# Patient Record
Sex: Male | Born: 1980
Health system: Southern US, Community
[De-identification: ages and names within clinical notes are randomized; demographics above are authoritative.]

## PROBLEM LIST (undated history)

## (undated) DIAGNOSIS — K7030 Alcoholic cirrhosis of liver without ascites: Secondary | ICD-10-CM

## (undated) DIAGNOSIS — I10 Essential (primary) hypertension: Secondary | ICD-10-CM

## (undated) DIAGNOSIS — S82899A Other fracture of unspecified lower leg, initial encounter for closed fracture: Secondary | ICD-10-CM

## (undated) DIAGNOSIS — D649 Anemia, unspecified: Secondary | ICD-10-CM

## (undated) DIAGNOSIS — R351 Nocturia: Secondary | ICD-10-CM

## (undated) DIAGNOSIS — K7011 Alcoholic hepatitis with ascites: Secondary | ICD-10-CM

## (undated) DIAGNOSIS — D696 Thrombocytopenia, unspecified: Secondary | ICD-10-CM

## (undated) DIAGNOSIS — R188 Other ascites: Secondary | ICD-10-CM

## (undated) DIAGNOSIS — K219 Gastro-esophageal reflux disease without esophagitis: Secondary | ICD-10-CM

## (undated) HISTORY — PX: FOOT SURGERY: SHX648

## (undated) HISTORY — PX: ORTHOPEDIC SURGERY: SHX850

## (undated) HISTORY — DX: Alcoholic cirrhosis of liver without ascites: K70.30

---

## 2007-04-03 ENCOUNTER — Emergency Department (HOSPITAL_COMMUNITY): Admission: EM | Admit: 2007-04-03 | Discharge: 2007-04-03 | Payer: Self-pay | Admitting: Family Medicine

## 2008-12-01 ENCOUNTER — Emergency Department (HOSPITAL_COMMUNITY): Admission: EM | Admit: 2008-12-01 | Discharge: 2008-12-01 | Payer: Self-pay | Admitting: Emergency Medicine

## 2010-08-05 LAB — POCT I-STAT, CHEM 8
BUN: 9 mg/dL (ref 6–23)
Chloride: 104 mEq/L (ref 96–112)
Creatinine, Ser: 0.8 mg/dL (ref 0.4–1.5)
Glucose, Bld: 94 mg/dL (ref 70–99)
Potassium: 4.4 mEq/L (ref 3.5–5.1)
Sodium: 136 mEq/L (ref 135–145)
TCO2: 25 mmol/L (ref 0–100)

## 2010-08-05 LAB — COMPREHENSIVE METABOLIC PANEL
ALT: 67 U/L — ABNORMAL HIGH (ref 0–53)
AST: 92 U/L — ABNORMAL HIGH (ref 0–37)
Albumin: 4.1 g/dL (ref 3.5–5.2)
Alkaline Phosphatase: 87 U/L (ref 39–117)
GFR calc Af Amer: >60 mL/min (ref >60)
Glucose, Bld: 94 mg/dL (ref 70–99)
Total Protein: 8.1 g/dL (ref 6.0–8.3)

## 2012-10-30 ENCOUNTER — Encounter (HOSPITAL_COMMUNITY): Payer: Self-pay | Admitting: Emergency Medicine

## 2012-10-30 ENCOUNTER — Emergency Department (INDEPENDENT_AMBULATORY_CARE_PROVIDER_SITE_OTHER)
Admission: EM | Admit: 2012-10-30 | Discharge: 2012-10-30 | Disposition: A | Discharge status: Home / Self Care | Payer: Self-pay | Source: Home / Self Care | Attending: Emergency Medicine | Admitting: Emergency Medicine

## 2012-10-30 DIAGNOSIS — Z4802 Encounter for removal of sutures: Secondary | ICD-10-CM

## 2012-10-30 DIAGNOSIS — S81802D Unspecified open wound, left lower leg, subsequent encounter: Secondary | ICD-10-CM

## 2012-10-30 NOTE — ED Provider Notes (Signed)
Medical screening examination/treatment/procedure(s) were performed by non-physician practitioner and as supervising physician I was immediately available for consultation/collaboration.  Leslee Home, M.D.  Reuben Likes, MD 10/30/12 [phone]
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
## 2015-07-04 ENCOUNTER — Encounter (HOSPITAL_COMMUNITY): Payer: Self-pay | Admitting: *Deleted

## 2015-07-04 ENCOUNTER — Emergency Department (HOSPITAL_COMMUNITY)
Admission: EM | Admit: 2015-07-04 | Discharge: 2015-07-04 | Disposition: A | Discharge status: Home / Self Care | Payer: Self-pay | Attending: Emergency Medicine | Admitting: Emergency Medicine

## 2015-07-04 DIAGNOSIS — I10 Essential (primary) hypertension: Secondary | ICD-10-CM | POA: Insufficient documentation

## 2015-07-04 DIAGNOSIS — M25562 Pain in left knee: Secondary | ICD-10-CM | POA: Insufficient documentation

## 2015-07-04 DIAGNOSIS — Z87828 Personal history of other (healed) physical injury and trauma: Secondary | ICD-10-CM | POA: Insufficient documentation

## 2015-07-04 HISTORY — DX: Essential (primary) hypertension: I10
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
## 2015-07-04 NOTE — ED Notes (Signed)
Declined W/C at D/C and was escorted to lobby by RN. 

## 2015-07-04 NOTE — ED Notes (Signed)
Pt reports pain to Lt knee worse when walking down stairs.

## 2017-06-27 ENCOUNTER — Emergency Department (HOSPITAL_COMMUNITY): Payer: Self-pay

## 2017-06-27 ENCOUNTER — Emergency Department (HOSPITAL_COMMUNITY)
Admission: EM | Admit: 2017-06-27 | Discharge: 2017-06-27 | Disposition: A | Discharge status: Home / Self Care | Payer: Self-pay | Attending: Emergency Medicine | Admitting: Emergency Medicine

## 2017-06-27 ENCOUNTER — Other Ambulatory Visit: Payer: Self-pay

## 2017-06-27 ENCOUNTER — Encounter (HOSPITAL_COMMUNITY): Payer: Self-pay

## 2017-06-27 DIAGNOSIS — K7031 Alcoholic cirrhosis of liver with ascites: Secondary | ICD-10-CM | POA: Insufficient documentation

## 2017-06-27 DIAGNOSIS — I10 Essential (primary) hypertension: Secondary | ICD-10-CM | POA: Insufficient documentation

## 2017-06-27 LAB — CBC
HCT: 36.5 % — ABNORMAL LOW (ref 39.0–52.0)
Hemoglobin: 12.9 g/dL — ABNORMAL LOW (ref 13.0–17.0)
MCH: 35.8 pg — ABNORMAL HIGH (ref 26.0–34.0)
MCHC: 35.3 g/dL (ref 30.0–36.0)
MCV: 101.4 fL — ABNORMAL HIGH (ref 78.0–100.0)
PLATELETS: 65 10*3/uL — AB (ref 150–400)
RBC: 3.6 MIL/uL — ABNORMAL LOW (ref 4.22–5.81)
RDW: 15.5 % (ref 11.5–15.5)
WBC: 11.1 10*3/uL — AB (ref 4.0–10.5)

## 2017-06-27 LAB — COMPREHENSIVE METABOLIC PANEL
ALK PHOS: 182 U/L — AB (ref 38–126)
ALT: 54 U/L (ref 17–63)
AST: 115 U/L — ABNORMAL HIGH (ref 15–41)
Albumin: 2.3 g/dL — ABNORMAL LOW (ref 3.5–5.0)
Anion gap: 9 (ref 5–15)
BUN: <5 mg/dL — ABNORMAL LOW (ref 6–20)
CALCIUM: 8 mg/dL — AB (ref 8.9–10.3)
CHLORIDE: 107 mmol/L (ref 101–111)
CO2: 19 mmol/L — ABNORMAL LOW (ref 22–32)
CREATININE: 0.42 mg/dL — AB (ref 0.61–1.24)
Glucose, Bld: 99 mg/dL (ref 65–99)
Potassium: 3.6 mmol/L (ref 3.5–5.1)
Sodium: 135 mmol/L (ref 135–145)
Total Bilirubin: 2.7 mg/dL — ABNORMAL HIGH (ref 0.3–1.2)
Total Protein: 6.7 g/dL (ref 6.5–8.1)

## 2017-06-27 LAB — PROTIME-INR
INR: 1.62
PROTHROMBIN TIME: 19.1 s — AB (ref 11.4–15.2)

## 2017-06-27 NOTE — ED Triage Notes (Signed)
Using spanish interpreter [phone], pt states he has had swelling all over his body X10 days. Pt has rx for lactulose at home and he states he has liver problems due to heavy ETOH use in the past. Last drink was Monday. Pt does appear to be swollen in his legs and abdomen.

## 2017-06-27 NOTE — ED Notes (Signed)
Patient still off the floor.

## 2017-06-27 NOTE — Discharge Instructions (Signed)
Please follow with your primary care doctor in the next 2 days for a check-up. They must obtain records for further management.  ° °Do not hesitate to return to the Emergency Department for any new, worsening or concerning symptoms.  ° °

## 2017-06-27 NOTE — Discharge Planning (Signed)
EDCM consulted to assist pt with establishing PCP.  [facility], who requests pt go to office to complete new pt packet before setting appointment date/time.

## 2017-06-27 NOTE — ED Notes (Signed)
Patient transported to Ultrasound 

## 2017-06-27 NOTE — ED Notes (Addendum)
D/c reviewed with patient-no further questions No e-signature available
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
## 2017-06-27 NOTE — ED Notes (Signed)
PATIENT BACK TO ROOM

## 2017-06-29 ENCOUNTER — Ambulatory Visit: Payer: Self-pay | Admitting: Physician Assistant

## 2017-06-29 ENCOUNTER — Encounter: Payer: Self-pay | Admitting: Physician Assistant

## 2017-06-29 DIAGNOSIS — K7031 Alcoholic cirrhosis of liver with ascites: Secondary | ICD-10-CM

## 2017-06-29 DIAGNOSIS — R601 Generalized edema: Secondary | ICD-10-CM

## 2017-06-29 DIAGNOSIS — F17200 Nicotine dependence, unspecified, uncomplicated: Secondary | ICD-10-CM

## 2017-06-29 DIAGNOSIS — Z6841 Body Mass Index (BMI) 40.0 and over, adult: Secondary | ICD-10-CM | POA: Insufficient documentation

## 2017-06-29 HISTORY — DX: Alcoholic cirrhosis of liver with ascites: K70.31

## 2017-06-29 MED ORDER — FUROSEMIDE 20 MG PO TABS
10.0000 mg | ORAL_TABLET | Freq: Every day | ORAL | 3 refills | Status: DC
Start: 2017-06-29 — End: 2018-03-07

## 2017-06-29 MED ORDER — SPIRONOLACTONE 25 MG PO TABS: 25.0000 mg | ORAL_TABLET | Freq: Every day | ORAL | 3 refills | Status: DC

## 2017-06-29 NOTE — Assessment & Plan Note (Signed)
Reviewed US and lab results from ED with patient. Provided information on this condition. Referral to GI. STOP potassium. Start spironolactone and furosemide, very low doses due to normal BP today. Re-evaluate in 1 week.

## 2017-06-29 NOTE — Patient Instructions (Addendum)
1. STOP the potassium. 2. START the new medications. 3. Stop smoking. 4. Do not consume alcohol.    IF you received an x-ray today, you will receive an invoice from [facility]. Please contact [facility] at [phone] with questions or concerns regarding your invoice.   IF you received labwork today, you will receive an invoice from [facility]. Please contact [facility] at [phone] with questions or concerns regarding your invoice.   Our billing staff will not be able to assist you with questions regarding bills from these companies.  You will be contacted with the lab results as soon as they are available. The fastest way to get your results is to activate your My Chart account. Instructions are located on the last page of this paperwork. If you have not heard from us regarding the results in 2 weeks, please contact this office.     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 La causa de esta enfermedad es el consumo excesivo de alcohol durante muchos aos. CUIDADOS EN EL HOGAR  No beba alcohol.  Tome los medicamentos solamente como se lo haya indicado el mdico.  Tome las vitaminas solamente como se lo haya indicado el mdico.  Siga las indicaciones que el mdico le dio respecto de la dieta. Puede ser necesario que: ? Consuma alimentos que contengan tiamina, entre ellos, cereales [place]eller[place] Terraceintegrales, cerdo y verduras crudas. ? Consuma alimentos con cido flico, entre ellos, verduras, frutas, carnes, frijoles, frutos secos y productos lcteos. ? Consuma alimentos con alto contenido de carbohidratos, entre ellos, yogur, frijoles, patatas y Surveyor, mineralsarroz. SOLICITE AYUDA SI:  Tiene fiebre.  Comienza a sentir falta de aire.  Tiene dificultad para respirar.  Observa sangre de color rojo brillante en las heces (materia fecal).  Las heces parecen  alquitranadas.  Vomita sangre.  La piel se le torna de un color ms amarillento, plida u oscura.  Tiene dolores de Turkmenistancabeza.  Tiene dificultad para pensar.  Tiene problemas de equilibrio o para caminar. Esta informacin no tiene Theme park managercomo fin reemplazar el consejo del mdico. Asegrese de hacerle al mdico cualquier pregunta que tenga. Document Released: 05/19/2010 Document Revised: 08/31/2014 Document Reviewed: 03/18/2014 Elsevier Interactive Patient Education  [employer]2018 [employer]
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
## 2017-06-29 NOTE — Progress Notes (Signed)
Patient ID: Jason [name], male     DOB: 12/05/1980, 37 y.o.    MRN: [id number]  PCP: Patient, No Pcp Per  Chief Complaint  Patient presents with  . Edema    for 12 days    Subjective:   This patient is new to me and presents for evaluation of swelling x 12 days. [employer] translation service utilized Jason [name], then [place])  The patient initially presented reporting swelling for 12 days, progressively worsening, starting after he was given "a bunch of vitamins" while in [employer] custody beginning 3 weeks ago. He reported that he did not know what they were or why he was given them.  Once his chart was reviewed, he clarified that he stopped drinking 3-4 weeks ago after long-term heavy drinking, and that he was seen in the emergency department for this same swelling several days ago.   Apparently, during his detainment, he was diagnosed with liver failure and started on: lactulose, thiamine, potassium and multivitamin.   Evaluation in the emergency department included: CMET, CBC PT/INR and US abdomen. Albumin 2.3, AST 115, ALT 54, Alk Phos 182, total bili 2.7 PT 19.1, INR 1.62 Hgb 12.9, HCT 36.5, MCV 101.4, platelets 65 Liver noted to be nodular, indicative of cirrhosis and moderate ascites.  As he was hemodynamically stable, he was released to follow-up as outpatient. He was encouraged to complete paperwork at the [name].  He denies chest pain, SOB, orthopnea, dizziness, urinary urgency, frequency, burning, diarrhea. No visual changes. No fever, chills.  Swelling has worsened a little bit. He is most concerned about swelling in the scrotum.  Swelling in the scrotum was some better when he first got up this morning, but has worsened a little over the course of the day so far..  Review of Systems  Constitutional: Negative.   HENT: Negative.   Eyes: Negative.   Respiratory: Negative.   Cardiovascular: Positive for leg swelling (extends up  the legs into the abdomen and involves the scrotum). Negative for chest pain and palpitations.  Gastrointestinal: Positive for abdominal distention. Negative for abdominal pain, anal bleeding, blood in stool, constipation, diarrhea, nausea, rectal pain and vomiting.  Endocrine: Negative.   Genitourinary: Positive for scrotal swelling. Negative for decreased urine volume, difficulty urinating, discharge, dysuria, enuresis, flank pain, frequency, genital sores, hematuria, penile pain, penile swelling, testicular pain and urgency.  Musculoskeletal: Negative.   Skin: Negative.   Allergic/Immunologic: Negative.   Neurological: Negative.   Hematological: Negative.   Psychiatric/Behavioral: Negative.      Prior to Admission medications   Medication Sig Start Date End Date Taking? Authorizing Provider  lactulose (CHRONULAC) 10 GM/15ML solution Take by mouth 2 (two) times daily.   Yes [provider]  Multiple Vitamins-Minerals (THEREMS-M) TABS Take by mouth.   Yes [provider]  potassium chloride (MICRO-K) 10 MEQ CR capsule Take 10 mEq by mouth 2 (two) times daily.   Yes [provider]  thiamine (VITAMIN B-1) 50 MG tablet Take 50 mg by mouth daily.   Yes [provider]  OVER THE COUNTER MEDICATION morphine    [provider]     No Known Allergies   Patient Active Problem List   Diagnosis Date Noted  . Edema 06/29/2017     Family History  Problem Relation Age of Onset  . Healthy Mother      Social History   Socioeconomic History  . Marital status: Married    Spouse name: Not on file  .  Number of children: Not on file  . Years of education: Not on file  . Highest education level: Not on file  Social Needs  . Financial resource strain: Not on file  . Food insecurity - worry: Not on file  . Food insecurity - inability: Not on file  . Transportation needs - medical: Not on file  . Transportation needs - non-medical: Not on file    Occupational History  . Occupation: Curator Engineer, materials)  Tobacco Use  . Smoking status: Current Every Day Smoker  . Smokeless tobacco: Never Used  . Tobacco comment: 10-15 cigarettes daily  Substance and Sexual Activity  . Alcohol use: Yes    Comment: Heavy ETOH use, quit 05/2017  . Drug use: No  . Sexual activity: Yes  Other Topics Concern  . Not on file  Social History Narrative   Lives in [place] with wife and daughter.          Objective:  Physical Exam  Constitutional: He is oriented to person, place, and time. He appears well-developed and well-nourished. He is active and cooperative. No distress.  BP 118/70 (BP Location: Left Arm, Patient Position: Sitting, Cuff Size: Large)   Pulse 78   Temp 98.2 F (36.8 C) (Oral)   Ht 5' 2.21" (1.58 m)   Wt 245 lb 3.2 oz (111.2 kg)   SpO2 98%   BMI 44.55 kg/m   HENT:  Head: Normocephalic and atraumatic.  Right Ear: Hearing normal.  Left Ear: Hearing normal.  Eyes: Conjunctivae are normal. No scleral icterus.  Neck: Normal range of motion. Neck supple. No thyromegaly present.  Cardiovascular: Normal rate, regular rhythm and normal heart sounds.  Pulses:      Radial pulses are 2+ on the right side, and 2+ on the left side.  1+ LE edema, bilateral No pre-sacral edema  Pulmonary/Chest: Effort normal and breath sounds normal.  Abdominal: Soft. He exhibits distension and ascites. He exhibits no shifting dullness, no pulsatile liver, no fluid wave, no abdominal bruit, no pulsatile midline mass and no mass. Bowel sounds are decreased. There is no hepatosplenomegaly. There is no tenderness. There is no rebound and no guarding.  Genitourinary: Penis normal.  Genitourinary Comments: Scrotal swelling. No testicular swelling or pain/tenderness.  Lymphadenopathy:       Head (right side): No tonsillar, no preauricular, no posterior auricular and no occipital adenopathy present.       Head (left side): No tonsillar, no preauricular,  no posterior auricular and no occipital adenopathy present.    He has no cervical adenopathy.       Right: No supraclavicular adenopathy present.       Left: No supraclavicular adenopathy present.  Neurological: He is alert and oriented to person, place, and time. No sensory deficit.  Skin: Skin is warm, dry and intact. No rash noted. No cyanosis or erythema. Nails show no clubbing.  Psychiatric: He has a normal mood and affect. His speech is normal and behavior is normal.        Assessment & Plan:   Problem List Items Addressed This Visit    Alcoholic cirrhosis of liver with ascites ([place])    Reviewed [place] and lab results from ED with patient. Provided information on this condition. Referral to GI. STOP potassium. Start spironolactone and furosemide, very low doses due to normal BP today. Re-evaluate in 1 week.      Relevant Medications   spironolactone (ALDACTONE) 25 MG tablet   furosemide (LASIX) 20 MG tablet  Other Relevant Orders   Ambulatory referral to Gastroenterology    Other Visit Diagnoses    Generalized edema           Return in about 1 week (around 07/06/2017) for re-evalaution of swelling and blood pressure.   Fara Chute, PA-C [facility]

## 2017-06-30 DIAGNOSIS — F17200 Nicotine dependence, unspecified, uncomplicated: Secondary | ICD-10-CM | POA: Insufficient documentation

## 2017-07-06 ENCOUNTER — Ambulatory Visit: Payer: Self-pay | Admitting: Family Medicine

## 2018-03-03 ENCOUNTER — Other Ambulatory Visit: Payer: Self-pay

## 2018-03-03 ENCOUNTER — Encounter (HOSPITAL_COMMUNITY): Payer: Self-pay | Admitting: *Deleted

## 2018-03-03 DIAGNOSIS — K7031 Alcoholic cirrhosis of liver with ascites: Secondary | ICD-10-CM | POA: Diagnosis present

## 2018-03-03 DIAGNOSIS — R161 Splenomegaly, not elsewhere classified: Secondary | ICD-10-CM | POA: Diagnosis present

## 2018-03-03 DIAGNOSIS — F17200 Nicotine dependence, unspecified, uncomplicated: Secondary | ICD-10-CM | POA: Diagnosis present

## 2018-03-03 DIAGNOSIS — Z79899 Other long term (current) drug therapy: Secondary | ICD-10-CM

## 2018-03-03 DIAGNOSIS — Z6841 Body Mass Index (BMI) 40.0 and over, adult: Secondary | ICD-10-CM

## 2018-03-03 DIAGNOSIS — R1900 Intra-abdominal and pelvic swelling, mass and lump, unspecified site: Secondary | ICD-10-CM | POA: Diagnosis present

## 2018-03-03 DIAGNOSIS — R600 Localized edema: Secondary | ICD-10-CM | POA: Diagnosis present

## 2018-03-03 DIAGNOSIS — E876 Hypokalemia: Secondary | ICD-10-CM | POA: Diagnosis present

## 2018-03-03 DIAGNOSIS — K766 Portal hypertension: Secondary | ICD-10-CM | POA: Diagnosis present

## 2018-03-03 DIAGNOSIS — D539 Nutritional anemia, unspecified: Secondary | ICD-10-CM | POA: Diagnosis present

## 2018-03-03 DIAGNOSIS — R319 Hematuria, unspecified: Secondary | ICD-10-CM | POA: Diagnosis present

## 2018-03-03 DIAGNOSIS — K7040 Alcoholic hepatic failure without coma: Principal | ICD-10-CM | POA: Diagnosis present

## 2018-03-03 DIAGNOSIS — K068 Other specified disorders of gingiva and edentulous alveolar ridge: Secondary | ICD-10-CM | POA: Diagnosis present

## 2018-03-03 DIAGNOSIS — K920 Hematemesis: Secondary | ICD-10-CM | POA: Diagnosis present

## 2018-03-03 DIAGNOSIS — D684 Acquired coagulation factor deficiency: Secondary | ICD-10-CM | POA: Diagnosis present

## 2018-03-03 DIAGNOSIS — D509 Iron deficiency anemia, unspecified: Secondary | ICD-10-CM | POA: Diagnosis present

## 2018-03-03 DIAGNOSIS — F1020 Alcohol dependence, uncomplicated: Secondary | ICD-10-CM | POA: Diagnosis present

## 2018-03-03 DIAGNOSIS — F17210 Nicotine dependence, cigarettes, uncomplicated: Secondary | ICD-10-CM | POA: Diagnosis present

## 2018-03-03 DIAGNOSIS — I10 Essential (primary) hypertension: Secondary | ICD-10-CM | POA: Diagnosis present

## 2018-03-03 DIAGNOSIS — D6959 Other secondary thrombocytopenia: Secondary | ICD-10-CM | POA: Diagnosis present

## 2018-03-03 DIAGNOSIS — E669 Obesity, unspecified: Secondary | ICD-10-CM | POA: Diagnosis present

## 2018-03-03 DIAGNOSIS — K7011 Alcoholic hepatitis with ascites: Secondary | ICD-10-CM | POA: Diagnosis present

## 2018-03-03 DIAGNOSIS — R51 Headache: Secondary | ICD-10-CM | POA: Diagnosis present

## 2018-03-03 NOTE — ED Triage Notes (Signed)
EMS reports pt with Leg swelling and flu like symptoms.

## 2018-03-04 ENCOUNTER — Emergency Department (HOSPITAL_COMMUNITY): Payer: Self-pay

## 2018-03-04 ENCOUNTER — Encounter (HOSPITAL_COMMUNITY): Payer: Self-pay | Admitting: Internal Medicine

## 2018-03-04 ENCOUNTER — Inpatient Hospital Stay (HOSPITAL_COMMUNITY)
Admission: EM | Admit: 2018-03-04 | Discharge: 2018-03-07 | DRG: 433 | Disposition: A | Discharge status: Home / Self Care | Payer: Self-pay | Attending: Family Medicine | Admitting: Family Medicine

## 2018-03-04 ENCOUNTER — Inpatient Hospital Stay (HOSPITAL_COMMUNITY): Payer: Self-pay

## 2018-03-04 DIAGNOSIS — K7030 Alcoholic cirrhosis of liver without ascites: Secondary | ICD-10-CM

## 2018-03-04 DIAGNOSIS — D696 Thrombocytopenia, unspecified: Secondary | ICD-10-CM | POA: Diagnosis present

## 2018-03-04 DIAGNOSIS — K920 Hematemesis: Secondary | ICD-10-CM | POA: Insufficient documentation

## 2018-03-04 DIAGNOSIS — K7290 Hepatic failure, unspecified without coma: Secondary | ICD-10-CM | POA: Diagnosis present

## 2018-03-04 DIAGNOSIS — R58 Hemorrhage, not elsewhere classified: Secondary | ICD-10-CM

## 2018-03-04 DIAGNOSIS — K7469 Other cirrhosis of liver: Secondary | ICD-10-CM | POA: Diagnosis present

## 2018-03-04 DIAGNOSIS — K7031 Alcoholic cirrhosis of liver with ascites: Secondary | ICD-10-CM | POA: Diagnosis present

## 2018-03-04 DIAGNOSIS — K7011 Alcoholic hepatitis with ascites: Secondary | ICD-10-CM

## 2018-03-04 LAB — BASIC METABOLIC PANEL
Anion gap: 8 (ref 5–15)
BUN: 9 mg/dL (ref 6–20)
CHLORIDE: 112 mmol/L — AB (ref 98–111)
CO2: 22 mmol/L (ref 22–32)
Calcium: 7.5 mg/dL — ABNORMAL LOW (ref 8.9–10.3)
Creatinine, Ser: 0.48 mg/dL — ABNORMAL LOW (ref 0.61–1.24)
GFR calc Af Amer: >60 mL/min (ref >60)
GFR calc non Af Amer: >60 mL/min (ref >60)
GLUCOSE: 97 mg/dL (ref 70–99)
POTASSIUM: 3.1 mmol/L — AB (ref 3.5–5.1)
Sodium: 142 mmol/L (ref 135–145)

## 2018-03-04 LAB — URINALYSIS, ROUTINE W REFLEX MICROSCOPIC
Bilirubin Urine: NEGATIVE
GLUCOSE, UA: 50 mg/dL — AB
Ketones, ur: NEGATIVE mg/dL
LEUKOCYTES UA: NEGATIVE
NITRITE: NEGATIVE
Protein, ur: 100 mg/dL — AB
RBC / HPF: >50 RBC/hpf — ABNORMAL HIGH (ref 0–5)
SPECIFIC GRAVITY, URINE: 1.008 (ref 1.005–1.030)
pH: 6 (ref 5.0–8.0)

## 2018-03-04 LAB — CBC WITH DIFFERENTIAL/PLATELET
Abs Immature Granulocytes: 0.02 10*3/uL (ref 0.00–0.07)
BASOS ABS: 0 10*3/uL (ref 0.0–0.1)
BASOS PCT: 0 %
EOS ABS: 0 10*3/uL (ref 0.0–0.5)
Eosinophils Relative: 0 %
HCT: 33.2 % — ABNORMAL LOW (ref 39.0–52.0)
Hemoglobin: 11.4 g/dL — ABNORMAL LOW (ref 13.0–17.0)
IMMATURE GRANULOCYTES: 0 %
Lymphocytes Relative: 13 %
Lymphs Abs: 0.7 10*3/uL (ref 0.7–4.0)
MCH: 35.6 pg — ABNORMAL HIGH (ref 26.0–34.0)
MCHC: 34.3 g/dL (ref 30.0–36.0)
MCV: 103.8 fL — ABNORMAL HIGH (ref 80.0–100.0)
MONO ABS: 0.7 10*3/uL (ref 0.1–1.0)
Monocytes Relative: 13 %
NEUTROS ABS: 3.7 10*3/uL (ref 1.7–7.7)
NEUTROS PCT: 74 %
Platelets: 13 10*3/uL — CL (ref 150–400)
RBC: 3.2 MIL/uL — ABNORMAL LOW (ref 4.22–5.81)
RDW: 15.9 % — AB (ref 11.5–15.5)
WBC: 5.1 10*3/uL (ref 4.0–10.5)
nRBC: 0 % (ref 0.0–0.2)

## 2018-03-04 LAB — TYPE AND SCREEN
ABO/RH(D): A POS
ANTIBODY SCREEN: NEGATIVE

## 2018-03-04 LAB — CBC
HCT: 30.1 % — ABNORMAL LOW (ref 39.0–52.0)
HEMOGLOBIN: 10.3 g/dL — AB (ref 13.0–17.0)
MCH: 35.9 pg — AB (ref 26.0–34.0)
MCHC: 34.2 g/dL (ref 30.0–36.0)
MCV: 104.9 fL — AB (ref 80.0–100.0)
PLATELETS: 13 10*3/uL — AB (ref 150–400)
RBC: 2.87 MIL/uL — AB (ref 4.22–5.81)
RDW: 16.2 % — ABNORMAL HIGH (ref 11.5–15.5)
WBC: 4.9 10*3/uL (ref 4.0–10.5)
nRBC: 0 % (ref 0.0–0.2)

## 2018-03-04 LAB — HIV ANTIBODY (ROUTINE TESTING W REFLEX): HIV Screen 4th Generation wRfx: NONREACTIVE

## 2018-03-04 LAB — I-STAT CG4 LACTIC ACID, ED
LACTIC ACID, VENOUS: 3.83 mmol/L — AB (ref 0.5–1.9)
LACTIC ACID, VENOUS: 3.45 mmol/L — AB (ref 0.5–1.9)

## 2018-03-04 LAB — COMPREHENSIVE METABOLIC PANEL
ALT: 53 U/L — ABNORMAL HIGH (ref 0–44)
ANION GAP: 12 (ref 5–15)
AST: 167 U/L — ABNORMAL HIGH (ref 15–41)
Albumin: 2.2 g/dL — ABNORMAL LOW (ref 3.5–5.0)
Alkaline Phosphatase: 153 U/L — ABNORMAL HIGH (ref 38–126)
BILIRUBIN TOTAL: 5.2 mg/dL — AB (ref 0.3–1.2)
BUN: 8 mg/dL (ref 6–20)
CO2: 18 mmol/L — ABNORMAL LOW (ref 22–32)
Calcium: 7.6 mg/dL — ABNORMAL LOW (ref 8.9–10.3)
Chloride: 110 mmol/L (ref 98–111)
Creatinine, Ser: 0.73 mg/dL (ref 0.61–1.24)
Glucose, Bld: 102 mg/dL — ABNORMAL HIGH (ref 70–99)
POTASSIUM: 3 mmol/L — AB (ref 3.5–5.1)
Sodium: 140 mmol/L (ref 135–145)
TOTAL PROTEIN: 6.8 g/dL (ref 6.5–8.1)

## 2018-03-04 LAB — HEPATIC FUNCTION PANEL
ALT: 51 U/L — AB (ref 0–44)
AST: 153 U/L — AB (ref 15–41)
Albumin: 1.9 g/dL — ABNORMAL LOW (ref 3.5–5.0)
Alkaline Phosphatase: 123 U/L (ref 38–126)
Bilirubin, Direct: 2.2 mg/dL — ABNORMAL HIGH (ref 0.0–0.2)
Indirect Bilirubin: 2.6 mg/dL — ABNORMAL HIGH (ref 0.3–0.9)
Total Bilirubin: 4.8 mg/dL — ABNORMAL HIGH (ref 0.3–1.2)
Total Protein: 6 g/dL — ABNORMAL LOW (ref 6.5–8.1)

## 2018-03-04 LAB — MAGNESIUM: MAGNESIUM: 1.4 mg/dL — AB (ref 1.7–2.4)

## 2018-03-04 LAB — AMMONIA: AMMONIA: 57 umol/L — AB (ref 9–35)

## 2018-03-04 LAB — PROTIME-INR
INR: 1.92
INR: 1.98
Prothrombin Time: 21.7 seconds — ABNORMAL HIGH (ref 11.4–15.2)
Prothrombin Time: 22.3 seconds — ABNORMAL HIGH (ref 11.4–15.2)

## 2018-03-04 LAB — LIPASE, BLOOD: Lipase: 79 U/L — ABNORMAL HIGH (ref 11–51)

## 2018-03-04 LAB — ABO/RH: ABO/RH(D): A POS

## 2018-03-04 LAB — PATHOLOGIST SMEAR REVIEW

## 2018-03-04 MED ORDER — SODIUM CHLORIDE 0.9 % IV SOLN
1.0000 g | Freq: Every day | INTRAVENOUS | Status: DC
  Administered 2018-03-05: 1 g via INTRAVENOUS
  Filled 2018-03-04: qty 1

## 2018-03-04 MED ORDER — POTASSIUM CHLORIDE CRYS ER 20 MEQ PO TBCR
40.0000 meq | EXTENDED_RELEASE_TABLET | Freq: Every day | ORAL | Status: DC
  Administered 2018-03-04 – 2018-03-05 (×2): 40 meq via ORAL
  Filled 2018-03-04 (×2): qty 2

## 2018-03-04 MED ORDER — FOLIC ACID 1 MG PO TABS
1.0000 mg | ORAL_TABLET | Freq: Every day | ORAL | Status: DC
  Administered 2018-03-04 – 2018-03-07 (×4): 1 mg via ORAL
  Filled 2018-03-04 (×4): qty 1

## 2018-03-04 MED ORDER — SODIUM CHLORIDE 0.9 % IV BOLUS: 1000.0000 mL | Freq: Once | INTRAVENOUS | Status: DC

## 2018-03-04 MED ORDER — LORAZEPAM 1 MG PO TABS: 1.0000 mg | ORAL_TABLET | Freq: Four times a day (QID) | ORAL | Status: AC | PRN

## 2018-03-04 MED ORDER — ONDANSETRON HCL 4 MG/2ML IJ SOLN: 4.0000 mg | Freq: Four times a day (QID) | INTRAMUSCULAR | Status: DC | PRN

## 2018-03-04 MED ORDER — SODIUM CHLORIDE 0.9 % IV BOLUS
1000.0000 mL | Freq: Once | INTRAVENOUS | Status: AC
  Administered 2018-03-04: 1000 mL via INTRAVENOUS

## 2018-03-04 MED ORDER — ALBUMIN HUMAN 25 % IV SOLN
12.5000 g | Freq: Four times a day (QID) | INTRAVENOUS | Status: AC
  Administered 2018-03-04 – 2018-03-05 (×3): 12.5 g via INTRAVENOUS
  Filled 2018-03-04 (×4): qty 50

## 2018-03-04 MED ORDER — ADULT MULTIVITAMIN W/MINERALS CH
1.0000 | ORAL_TABLET | Freq: Every day | ORAL | Status: DC
  Administered 2018-03-04 – 2018-03-07 (×4): 1 via ORAL
  Filled 2018-03-04 (×4): qty 1

## 2018-03-04 MED ORDER — ONDANSETRON HCL 4 MG PO TABS: 4.0000 mg | ORAL_TABLET | Freq: Four times a day (QID) | ORAL | Status: DC | PRN

## 2018-03-04 MED ORDER — PREDNISOLONE 5 MG PO TABS
40.0000 mg | ORAL_TABLET | Freq: Every day | ORAL | Status: DC
  Administered 2018-03-04 – 2018-03-07 (×4): 40 mg via ORAL
  Filled 2018-03-04 (×4): qty 8

## 2018-03-04 MED ORDER — LORAZEPAM 2 MG/ML IJ SOLN: 1.0000 mg | Freq: Four times a day (QID) | INTRAMUSCULAR | Status: AC | PRN

## 2018-03-04 MED ORDER — MAGNESIUM SULFATE 2 GM/50ML IV SOLN
2.0000 g | Freq: Once | INTRAVENOUS | Status: AC
  Administered 2018-03-04: 2 g via INTRAVENOUS
  Filled 2018-03-04: qty 50

## 2018-03-04 MED ORDER — ACETAMINOPHEN 650 MG RE SUPP: 650.0000 mg | Freq: Four times a day (QID) | RECTAL | Status: DC | PRN

## 2018-03-04 MED ORDER — ACETAMINOPHEN 325 MG PO TABS: 650.0000 mg | ORAL_TABLET | Freq: Four times a day (QID) | ORAL | Status: DC | PRN

## 2018-03-04 MED ORDER — PANTOPRAZOLE SODIUM 40 MG PO TBEC
40.0000 mg | DELAYED_RELEASE_TABLET | Freq: Every day | ORAL | Status: DC
  Administered 2018-03-04 – 2018-03-07 (×4): 40 mg via ORAL
  Filled 2018-03-04 (×4): qty 1

## 2018-03-04 MED ORDER — VITAMIN K1 10 MG/ML IJ SOLN
10.0000 mg | Freq: Every day | INTRAMUSCULAR | Status: AC
  Administered 2018-03-04 – 2018-03-06 (×3): 10 mg via SUBCUTANEOUS
  Filled 2018-03-04 (×3): qty 1

## 2018-03-04 MED ORDER — FUROSEMIDE 20 MG PO TABS
20.0000 mg | ORAL_TABLET | Freq: Every day | ORAL | Status: DC
  Administered 2018-03-05: 20 mg via ORAL
  Filled 2018-03-04: qty 1

## 2018-03-04 MED ORDER — THIAMINE HCL 100 MG/ML IJ SOLN: 100.0000 mg | Freq: Every day | INTRAMUSCULAR | Status: DC

## 2018-03-04 MED ORDER — VITAMIN B-1 100 MG PO TABS
100.0000 mg | ORAL_TABLET | Freq: Every day | ORAL | Status: DC
  Administered 2018-03-04 – 2018-03-07 (×4): 100 mg via ORAL
  Filled 2018-03-04 (×4): qty 1

## 2018-03-04 MED ORDER — LORAZEPAM 2 MG/ML IJ SOLN: 0.0000 mg | Freq: Two times a day (BID) | INTRAMUSCULAR | Status: DC

## 2018-03-04 MED ORDER — LORAZEPAM 2 MG/ML IJ SOLN
0.0000 mg | Freq: Four times a day (QID) | INTRAMUSCULAR | Status: DC
  Administered 2018-03-04: 1 mg via INTRAVENOUS
  Filled 2018-03-04: qty 1

## 2018-03-04 MED ORDER — SODIUM CHLORIDE 0.9% IV SOLUTION
Freq: Once | INTRAVENOUS | Status: AC
  Administered 2018-03-04: 06:00:00 via INTRAVENOUS

## 2018-03-04 MED ORDER — LACTULOSE 10 GM/15ML PO SOLN
20.0000 g | Freq: Two times a day (BID) | ORAL | Status: DC
  Administered 2018-03-04 – 2018-03-07 (×7): 20 g via ORAL
  Filled 2018-03-04 (×7): qty 30
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
## 2018-03-04 NOTE — Progress Notes (Signed)
PROGRESS NOTE    Jason [name]  ZOX:[id number] DOB: 1981-03-11 DOA: 03/04/2018 PCP: Patient, No Pcp Per      Brief Narrative:  Mr. Jason [name] is a 37 y.o. M with alcoholic cirrhosis presents with abdominal swelling, leg swelling, malaise, found to have encephalopathy, platelets 13K, gums bleeding.  MELD 20, Maddrey discrim 36.      Assessment & Plan:  Alcoholic liver failure Severe and lifethreatening. MELD 20.  High short term risk of death. -Continue lactulose -Start low dose Lasix -Daily BMP, intake/output monitoring -Consult Gastroenterology, appreciate expert guidance -Follow AFP, hepatitis serologies -Complete alcohol cessation was recommended   Alcoholic hepatitis Maddrey discriminant function 36.   -Consult Gastroenterology re: steroids -Continue prednisolone  Thrombocytopenia Severe.  He has splenomegaly in February this year, likely his actual platelet count is quite a bit higher.  There was some gums bleeindg this morning, given 1 unit Plts. CT head negative for bleeding. I have discussed by phone with Hematology, who recommended platelet transfusions and FFP only for symptomatic bleding, if only gums bleeidng, would recommend Amicar.  Hopefully, if he is abstinent from alcohol, his bone marrow will recover enough in the next couple days to repopulate his platelet count. -Daily CBC  Alcohol dependence -Continue folate, thiamine -Continue CIWA monitoring  Hypokalemia -Trend K      DVT prophylaxis: SCDs Code Status: FULL Family Communication: Brother and roommate at bedside MDM and disposition Plan: This is a no charge note.  For further details, please see H&P by my partner Dr. Toniann Fail from earlier today.  The below labs and imaging reports were reviewed and summarized above.    The patient was admitted with liver failure.  This is a very high risk situation, the patient has an extremely high risk of decompensation and death.   All history  collected throgh videophonic interpreter.      Objective: Vitals:   03/04/18 0855 03/04/18 0924 03/04/18 1140 03/04/18 1217  BP: (!) 105/59 119/65 118/68 124/64  Pulse: 97 95 94 92  Resp: 18 18 18 18   Temp: 99.4 F (37.4 C) 99.6 F (37.6 C) 99.5 F (37.5 C) 99.4 F (37.4 C)  TempSrc: Oral Oral Oral Oral  SpO2: 97% 99% 94% 98%  Weight:        Intake/Output Summary (Last 24 hours) at 03/04/2018 1517 Last data filed at 03/04/2018 1140 Gross per 24 hour  Intake 300 ml  Output -  Net 300 ml   Filed Weights   03/04/18 0213  Weight: 104.8 kg    Examination: The patient was seen and examined.      Data Reviewed: I have personally reviewed following labs and imaging studies:  CBC: Recent Labs  Lab 03/04/18 0241 03/04/18 0739  WBC 5.1 4.9  NEUTROABS 3.7  --   HGB 11.4* 10.3*  HCT 33.2* 30.1*  MCV 103.8* 104.9*  PLT 13* 13*   Basic Metabolic Panel: Recent Labs  Lab 03/04/18 0241  NA 140  K 3.0*  CL 110  CO2 18*  GLUCOSE 102*  BUN 8  CREATININE 0.73  CALCIUM 7.6*   GFR: CrCl cannot be calculated (Unknown ideal weight.). Liver Function Tests: Recent Labs  Lab 03/04/18 0241 03/04/18 0739  AST 167* 153*  ALT 53* 51*  ALKPHOS 153* 123  BILITOT 5.2* 4.8*  PROT 6.8 6.0*  ALBUMIN 2.2* 1.9*   Recent Labs  Lab 03/04/18 0241  LIPASE 79*   Recent Labs  Lab 03/04/18 1236  AMMONIA 57*   Coagulation Profile: Recent  Labs  Lab 03/04/18 0241  INR 1.92   Cardiac Enzymes: No results for input(s): CKTOTAL, CKMB, CKMBINDEX, TROPONINI in the last 168 hours. BNP (last 3 results) No results for input(s): PROBNP in the last 8760 hours. HbA1C: No results for input(s): HGBA1C in the last 72 hours. CBG: No results for input(s): GLUCAP in the last 168 hours. Lipid Profile: No results for input(s): CHOL, HDL, LDLCALC, TRIG, CHOLHDL, LDLDIRECT in the last 72 hours. Thyroid Function Tests: No results for input(s): TSH, T4TOTAL, FREET4, T3FREE, THYROIDAB in  the last 72 hours. Anemia Panel: No results for input(s): VITAMINB12, FOLATE, FERRITIN, TIBC, IRON, RETICCTPCT in the last 72 hours. Urine analysis:    Component Value Date/Time   COLORURINE RED (A) 03/04/2018 0154   APPEARANCEUR HAZY (A) 03/04/2018 0154   LABSPEC 1.008 03/04/2018 0154   PHURINE 6.0 03/04/2018 0154   GLUCOSEU 50 (A) 03/04/2018 0154   HGBUR LARGE (A) 03/04/2018 0154   BILIRUBINUR NEGATIVE 03/04/2018 0154   KETONESUR NEGATIVE 03/04/2018 0154   PROTEINUR 100 (A) 03/04/2018 0154   NITRITE NEGATIVE 03/04/2018 0154   LEUKOCYTESUR NEGATIVE 03/04/2018 0154   Sepsis Labs: @LABRCNTIP (procalcitonin:4,lacticacidven:4)  )No results found for this or any previous visit (from the past 240 hour(s)).       Radiology Studies: Dg Chest 2 View  Result Date: 03/04/2018 CLINICAL DATA:  Fever and body aches for 48 hours. EXAM: CHEST - 2 VIEW COMPARISON:  04/03/2007 FINDINGS: Shallow inspiration with probable atelectasis in the lung bases. Heart size and pulmonary vascularity are normal. No consolidation or airspace disease in the lungs. No blunting of costophrenic angles. No pneumothorax. Mediastinal contours appear intact. Degenerative changes in the spine. IMPRESSION: Shallow inspiration with atelectasis in the lung bases. Electronically Signed   By: Burman Nieves M.D.   On: 03/04/2018 02:26   Ct Head Wo Contrast  Result Date: 03/04/2018 CLINICAL DATA:  Headaches. EXAM: CT HEAD WITHOUT CONTRAST TECHNIQUE: Contiguous axial images were obtained from the base of the skull through the vertex without intravenous contrast. COMPARISON:  None. FINDINGS: Brain: The ventricles are normal in size and configuration. No extra-axial fluid collections are identified. The gray-white differentiation is maintained. No CT findings for acute hemispheric infarction or intracranial hemorrhage. No mass lesions. The brainstem and cerebellum are normal. Vascular: No hyperdense vessels or obvious aneurysm.  Skull: No acute skull fracture.  No bone lesion. Sinuses/Orbits: The paranasal sinuses and mastoid air cells are clear except for scattered mucoperiosteal thickening involving the right ethmoid air cells and also partial opacification of the right maxillary sinus. The mastoid air cells and middle ear cavities are clear. The globes are intact. Other: Soft tissue density in the left posterior parietal region of the scalp could be a small scalp hematoma although there is no stated history of trauma. There is a similar finding in the left lower occipital area. IMPRESSION: 1. No acute intracranial findings or mass lesion. 2. Scattered sinus disease. 3. 2 left-sided scalp densities could be small scalp hematomas or scar tissue related to prior trauma. 4. No skull fracture or bone lesions. Electronically Signed   By: Rudie Meyer M.D.   On: 03/04/2018 08:47        Scheduled Meds: . folic acid  1 mg Oral Daily  . lactulose  20 g Oral BID  . LORazepam  0-4 mg Intravenous Q6H   Followed by  . [START ON 03/06/2018] LORazepam  0-4 mg Intravenous Q12H  . multivitamin with minerals  1 tablet Oral Daily  .  pantoprazole  40 mg Oral Q0600  . prednisoLONE  40 mg Oral Q breakfast  . thiamine  100 mg Oral Daily   Or  . thiamine  100 mg Intravenous Daily   Continuous Infusions: . albumin human 12.5 g (03/04/18 1333)  . cefTRIAXone (ROCEPHIN)  IV       LOS: 0 days    Time spent: 20 minutes    Alberteen Sam, MD [facility] 03/04/2018, 3:17 PM     Pager [phone] --- please page though AMION:  [email or web address] Password [email or web address] If 7PM-7AM, please contact night-coverage

## 2018-03-04 NOTE — Progress Notes (Signed)
Pt arrived to unit in NAD. Noted that pt has blood in mouth, looks like it is coming from gumline in the back of his mouth. Using translator to communicate with pt to complete admission.
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
## 2018-03-04 NOTE — ED Notes (Signed)
I gave critical I Stat CG4 result to MD Cardama

## 2018-03-04 NOTE — ED Notes (Addendum)
Notified EDP,Cardama,MD. Pt. I-stat CG4 Lactic acid results 3.83 and RN,Amber made aware.
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
## 2018-03-05 DIAGNOSIS — D689 Coagulation defect, unspecified: Secondary | ICD-10-CM

## 2018-03-05 DIAGNOSIS — F1010 Alcohol abuse, uncomplicated: Secondary | ICD-10-CM

## 2018-03-05 DIAGNOSIS — K7469 Other cirrhosis of liver: Secondary | ICD-10-CM

## 2018-03-05 LAB — CBC
HCT: 29.7 % — ABNORMAL LOW (ref 39.0–52.0)
HEMOGLOBIN: 10.2 g/dL — AB (ref 13.0–17.0)
MCH: 35.7 pg — AB (ref 26.0–34.0)
MCHC: 34.3 g/dL (ref 30.0–36.0)
MCV: 103.8 fL — ABNORMAL HIGH (ref 80.0–100.0)
NRBC: 0 % (ref 0.0–0.2)
Platelets: 25 10*3/uL — CL (ref 150–400)
RBC: 2.86 MIL/uL — AB (ref 4.22–5.81)
RDW: 15.8 % — ABNORMAL HIGH (ref 11.5–15.5)
WBC: 5.2 10*3/uL (ref 4.0–10.5)

## 2018-03-05 LAB — COMPREHENSIVE METABOLIC PANEL
ALT: 43 U/L (ref 0–44)
AST: 118 U/L — ABNORMAL HIGH (ref 15–41)
Albumin: 2.2 g/dL — ABNORMAL LOW (ref 3.5–5.0)
Alkaline Phosphatase: 72 U/L (ref 38–126)
Anion gap: 7 (ref 5–15)
BUN: 9 mg/dL (ref 6–20)
CHLORIDE: 112 mmol/L — AB (ref 98–111)
CO2: 22 mmol/L (ref 22–32)
CREATININE: 0.48 mg/dL — AB (ref 0.61–1.24)
Calcium: 7.6 mg/dL — ABNORMAL LOW (ref 8.9–10.3)
GFR calc non Af Amer: >60 mL/min (ref >60)
Glucose, Bld: 91 mg/dL (ref 70–99)
POTASSIUM: 3.1 mmol/L — AB (ref 3.5–5.1)
SODIUM: 141 mmol/L (ref 135–145)
Total Bilirubin: 7.4 mg/dL — ABNORMAL HIGH (ref 0.3–1.2)
Total Protein: 6.1 g/dL — ABNORMAL LOW (ref 6.5–8.1)

## 2018-03-05 LAB — HEPATITIS PANEL, ACUTE
Hep A IgM: NEGATIVE
Hep B C IgM: NEGATIVE
Hepatitis B Surface Ag: NEGATIVE

## 2018-03-05 LAB — PREPARE PLATELET PHERESIS
UNIT DIVISION: 0
Unit division: 0

## 2018-03-05 LAB — BPAM PLATELET PHERESIS
BLOOD PRODUCT EXPIRATION DATE: 201911062359
Blood Product Expiration Date: 201911062359
ISSUE DATE / TIME: 201911051154
ISSUE DATE / TIME: 201911050854
Unit Type and Rh: 5100
Unit Type and Rh: 6200

## 2018-03-05 LAB — AFP TUMOR MARKER: AFP, SERUM, TUMOR MARKER: 5 ng/mL (ref 0.0–8.3)

## 2018-03-05 LAB — PROTIME-INR
INR: 2.52
Prothrombin Time: 26.8 seconds — ABNORMAL HIGH (ref 11.4–15.2)

## 2018-03-05 MED ORDER — SPIRONOLACTONE 100 MG PO TABS
100.0000 mg | ORAL_TABLET | Freq: Every day | ORAL | Status: DC
  Administered 2018-03-06 – 2018-03-07 (×2): 100 mg via ORAL
  Filled 2018-03-05 (×2): qty 1

## 2018-03-05 MED ORDER — FUROSEMIDE 40 MG PO TABS
40.0000 mg | ORAL_TABLET | Freq: Every day | ORAL | Status: DC
  Administered 2018-03-06 – 2018-03-07 (×2): 40 mg via ORAL
  Filled 2018-03-05 (×2): qty 1

## 2018-03-05 MED ORDER — CIPROFLOXACIN HCL 500 MG PO TABS
500.0000 mg | ORAL_TABLET | Freq: Every day | ORAL | Status: DC
  Administered 2018-03-06 – 2018-03-07 (×2): 500 mg via ORAL
  Filled 2018-03-05 (×2): qty 1

## 2018-03-05 NOTE — Progress Notes (Signed)
PROGRESS NOTE    Jason [name]  ZOX:[id number] DOB: Aug 25, 1980 DOA: 03/04/2018 PCP: Patient, No Pcp Per      Brief Narrative:  Jason [name] is a 37 y.o. M with alcoholic cirrhosis presents with abdominal swelling, leg swelling, malaise, found to have encephalopathy, platelets 13K, gums bleeding.  MELD 20, Maddrey discriminant 36.   Started on steroids and admitted for acute alcoholic liver failure.    Assessment & Plan:  Alcoholic liver failure Alcoholic cirrhosis He is unchanged.  Severe and life-threatening liver failure.  MELD 20.  High short term risk of death.  High risk of death with continued drinking discussed with daughter at bedside today.    Ammonia elevated, mentation slightly better today.  AFP normal, hepatitis viral serologies negative.  No hx varices, will need EGD. -Continue lactulose -Continue Lasix -Ddaily BMP -Consult Gastroenterology, appreciate expert guidance -Complete alcohol cessation was recommended again   Alcoholic hepatitis Maddrey discriminant function 36.   -Consult Gastroenterology re: steroids -Continue prednisolone for 30 days (until Dec 4), the re-eval per GI  Thrombocytopenia Severe.  Got 1 units platelets. No gums bleeding, epistaxis overnight.  ?hematuria.   -FFP and platelet transfusion for any clinically significant symptomatic bleeding -Discussed with Heme, can use Amicar if gums bleeding becomes problem -Otherwise, hopefully his platelets will rise as alcohol poisoning wears off his bone marrow -Daily CBC  Alcohol dependence Has hx withdrawals, no clear history of DTs.  -Continue folate, thiamine -Continue CIWA  Hypokalemia -Repelete K       DVT prophylaxis: SCDs Code Status: FULL Family Communication: Daughter at bedside. MDM and disposition Plan: The below labs and imaging reports were viewed and summarized above.  Medication management as above.  The case was discussed with gastroenterology.  The patient  was admitted with severe and life-threatening liver failure.  He has a high risk illness and has an extremely high risk of decompensation/complications including bleeding, infection, hemodynamic instability, and death.     All history collected throgh videophonic interpreter.      Objective: Vitals:   03/04/18 1959 03/04/18 2340 03/05/18 0455 03/05/18 0500  BP: 129/67 128/75 124/64   Pulse: 92 96 100   Resp: (!) 22 18 18    Temp: 98.8 F (37.1 C) 99.3 F (37.4 C) 99.7 F (37.6 C)   TempSrc: Oral Oral Oral   SpO2: 99% 98% 98%   Weight:    105.6 kg    Intake/Output Summary (Last 24 hours) at 03/05/2018 1114 Last data filed at 03/05/2018 1012 Gross per 24 hour  Intake 733.66 ml  Output 500 ml  Net 233.66 ml   Filed Weights   03/04/18 0213 03/05/18 0500  Weight: 104.8 kg 105.6 kg    Examination:   General: Adult male, sitting up in bed, interactive. HEENT: Corneas clear, conjunctivae and sclerae normal without injection sclera very icteric, lids and lashes normal.  Visual tracking smooth.  Oropharynx tacky dry, no oral lesions, hearing normal.    Cardiac: Tachycardic, regular, no murmurs, JVP elevated, mild lower extremity pitting edema. Respiratory: Tachypneic, shallow.  Lungs clear without rales or wheezes. Abdomen: Abdominal ascites and distention is noted, I cannot appreciate splenomegaly or hepatomegaly on exam, although I am aware from imaging that they are present.  He is not particularly tender, no tense ascites.. Extremities: No deformities/injuries.  5/5 grip strength and upper extremity flexion/extension, symmetrically.  Extremities are warm and well-perfused. Neuro: Sensorium intact.  Speech is fluent in [place].  Strength appears 5 out of 5 and symmetric  in upper and lower extremities bilaterally.  No tremor. Psych: His attention is normal, thought process linear, affect normal.     Data Reviewed: I have personally reviewed following labs and imaging  studies:  CBC: Recent Labs  Lab 03/04/18 0241 03/04/18 0739 03/05/18 0619  WBC 5.1 4.9 5.2  NEUTROABS 3.7  --   --   HGB 11.4* 10.3* 10.2*  HCT 33.2* 30.1* 29.7*  MCV 103.8* 104.9* 103.8*  PLT 13* 13* 25*   Basic Metabolic Panel: Recent Labs  Lab 03/04/18 0241 03/04/18 1553 03/05/18 0619  NA 140 142 141  K 3.0* 3.1* 3.1*  CL 110 112* 112*  CO2 18* 22 22  GLUCOSE 102* 97 91  BUN 8 9 9   CREATININE 0.73 0.48* 0.48*  CALCIUM 7.6* 7.5* 7.6*  MG  --  1.4*  --    GFR: CrCl cannot be calculated (Unknown ideal weight.). Liver Function Tests: Recent Labs  Lab 03/04/18 0241 03/04/18 0739 03/05/18 0619  AST 167* 153* 118*  ALT 53* 51* 43  ALKPHOS 153* 123 72  BILITOT 5.2* 4.8* 7.4*  PROT 6.8 6.0* 6.1*  ALBUMIN 2.2* 1.9* 2.2*   Recent Labs  Lab 03/04/18 0241  LIPASE 79*   Recent Labs  Lab 03/04/18 1236  AMMONIA 57*   Coagulation Profile: Recent Labs  Lab 03/04/18 0241 03/04/18 1553 03/05/18 0619  INR 1.92 1.98 2.52   Cardiac Enzymes: No results for input(s): CKTOTAL, CKMB, CKMBINDEX, TROPONINI in the last 168 hours. BNP (last 3 results) No results for input(s): PROBNP in the last 8760 hours. HbA1C: No results for input(s): HGBA1C in the last 72 hours. CBG: No results for input(s): GLUCAP in the last 168 hours. Lipid Profile: No results for input(s): CHOL, HDL, LDLCALC, TRIG, CHOLHDL, LDLDIRECT in the last 72 hours. Thyroid Function Tests: No results for input(s): TSH, T4TOTAL, FREET4, T3FREE, THYROIDAB in the last 72 hours. Anemia Panel: No results for input(s): VITAMINB12, FOLATE, FERRITIN, TIBC, IRON, RETICCTPCT in the last 72 hours. Urine analysis:    Component Value Date/Time   COLORURINE RED (A) 03/04/2018 0154   APPEARANCEUR HAZY (A) 03/04/2018 0154   LABSPEC 1.008 03/04/2018 0154   PHURINE 6.0 03/04/2018 0154   GLUCOSEU 50 (A) 03/04/2018 0154   HGBUR LARGE (A) 03/04/2018 0154   BILIRUBINUR NEGATIVE 03/04/2018 0154   KETONESUR NEGATIVE  03/04/2018 0154   PROTEINUR 100 (A) 03/04/2018 0154   NITRITE NEGATIVE 03/04/2018 0154   LEUKOCYTESUR NEGATIVE 03/04/2018 0154   Sepsis Labs: @LABRCNTIP (procalcitonin:4,lacticacidven:4)  )No results found for this or any previous visit (from the past 240 hour(s)).       Radiology Studies: Dg Chest 2 View  Result Date: 03/04/2018 CLINICAL DATA:  Fever and body aches for 48 hours. EXAM: CHEST - 2 VIEW COMPARISON:  04/03/2007 FINDINGS: Shallow inspiration with probable atelectasis in the lung bases. Heart size and pulmonary vascularity are normal. No consolidation or airspace disease in the lungs. No blunting of costophrenic angles. No pneumothorax. Mediastinal contours appear intact. Degenerative changes in the spine. IMPRESSION: Shallow inspiration with atelectasis in the lung bases. Electronically Signed   By: Burman Nieves M.D.   On: 03/04/2018 02:26   Ct Head Wo Contrast  Result Date: 03/04/2018 CLINICAL DATA:  Headaches. EXAM: CT HEAD WITHOUT CONTRAST TECHNIQUE: Contiguous axial images were obtained from the base of the skull through the vertex without intravenous contrast. COMPARISON:  None. FINDINGS: Brain: The ventricles are normal in size and configuration. No extra-axial fluid collections are identified. The gray-white differentiation  is maintained. No CT findings for acute hemispheric infarction or intracranial hemorrhage. No mass lesions. The brainstem and cerebellum are normal. Vascular: No hyperdense vessels or obvious aneurysm. Skull: No acute skull fracture.  No bone lesion. Sinuses/Orbits: The paranasal sinuses and mastoid air cells are clear except for scattered mucoperiosteal thickening involving the right ethmoid air cells and also partial opacification of the right maxillary sinus. The mastoid air cells and middle ear cavities are clear. The globes are intact. Other: Soft tissue density in the left posterior parietal region of the scalp could be a small scalp hematoma  although there is no stated history of trauma. There is a similar finding in the left lower occipital area. IMPRESSION: 1. No acute intracranial findings or mass lesion. 2. Scattered sinus disease. 3. 2 left-sided scalp densities could be small scalp hematomas or scar tissue related to prior trauma. 4. No skull fracture or bone lesions. Electronically Signed   By: Rudie Meyer M.D.   On: 03/04/2018 08:47        Scheduled Meds: . folic acid  1 mg Oral Daily  . furosemide  20 mg Oral Daily  . lactulose  20 g Oral BID  . LORazepam  0-4 mg Intravenous Q6H   Followed by  . [START ON 03/06/2018] LORazepam  0-4 mg Intravenous Q12H  . multivitamin with minerals  1 tablet Oral Daily  . pantoprazole  40 mg Oral Q0600  . phytonadione  10 mg Subcutaneous Daily  . potassium chloride  40 mEq Oral Daily  . prednisoLONE  40 mg Oral Q breakfast  . thiamine  100 mg Oral Daily   Or  . thiamine  100 mg Intravenous Daily   Continuous Infusions: . cefTRIAXone (ROCEPHIN)  IV Stopped (03/05/18 1610)     LOS: 1 day    Time spent: 35 minutes    Alberteen Sam, MD [facility] 03/05/2018, 11:14 AM     Pager [phone] --- please page though AMION:  [email or web address] Password [email or web address] If 7PM-7AM, please contact night-coverage

## 2018-03-05 NOTE — Progress Notes (Signed)
Patient ID: Jason [name], male   DOB: [birth year], 37 y.o.   MRN: [id number]    Progress Note   Subjective   Patient's daughter is in the room today, she speaks [place] and did interpreting.  Patient does understand some [place]. He says he feels a little bit shaky or jittery but otherwise okay.  He is tolerating full liquids and is hungry.  No complaints of nausea or vomiting and no abdominal pain though his abdomen feels full.  He relates that he generally drinks at least 12 beers per day.  He has successfully stopped drinking in the past. He understands that he is currently admitted with "liver failure".   Objective   Vital signs in last 24 hours: Temp:  [98.8 F (37.1 C)-99.7 F (37.6 C)] 99.7 F (37.6 C) (11/06 0455) Pulse Rate:  [78-100] 100 (11/06 0455) Resp:  [18-22] 18 (11/06 0455) BP: (118-130)/(64-80) 124/64 (11/06 0455) SpO2:  [94 %-99 %] 98 % (11/06 0455) Weight:  [105.6 kg] 105.6 kg (11/06 0500) Last BM Date: 03/04/18 General:   Young Hispanic male in NAD alert, pleasant.  Jaundiced Heart:  Regular rate and rhythm; no murmurs Lungs: Respirations even and unlabored, lungs CTA bilaterally Abdomen:  Soft,, non-tense ascites, nontender no palpable mass or hepatosplenomegaly. Normal bowel sounds. Extremities: Trace edema  Neurologic:  Alert and oriented,  grossly normal neurologically.  No asterixis Psych:  Cooperative. Normal mood and affect.  Intake/Output from previous day: 11/05 0701 - 11/06 0700 In: 599 [Blood:531; IV Piggyback:68] Out: 500 [Urine:500] Intake/Output this shift: No intake/output data recorded.  Lab Results: Recent Labs    03/04/18 0241 03/04/18 0739 03/05/18 0619  WBC 5.1 4.9 5.2  HGB 11.4* 10.3* 10.2*  HCT 33.2* 30.1* 29.7*  PLT 13* 13* 25*   BMET Recent Labs    03/04/18 0241 03/04/18 1553 03/05/18 0619  NA 140 142 141  K 3.0* 3.1* 3.1*  CL 110 112* 112*  CO2 18* 22 22  GLUCOSE 102* 97 91  BUN 8 9 9   CREATININE 0.73  0.48* 0.48*  CALCIUM 7.6* 7.5* 7.6*   LFT Recent Labs    03/04/18 0739 03/05/18 0619  PROT 6.0* 6.1*  ALBUMIN 1.9* 2.2*  AST 153* 118*  ALT 51* 43  ALKPHOS 123 72  BILITOT 4.8* 7.4*  BILIDIR 2.2*  --   IBILI 2.6*  --    PT/INR Recent Labs    03/04/18 1553 03/05/18 0619  LABPROT 22.3* 26.8*  INR 1.98 2.52    Studies/Results: Dg Chest 2 View  Result Date: 03/04/2018 CLINICAL DATA:  Fever and body aches for 48 hours. EXAM: CHEST - 2 VIEW COMPARISON:  04/03/2007 FINDINGS: Shallow inspiration with probable atelectasis in the lung bases. Heart size and pulmonary vascularity are normal. No consolidation or airspace disease in the lungs. No blunting of costophrenic angles. No pneumothorax. Mediastinal contours appear intact. Degenerative changes in the spine. IMPRESSION: Shallow inspiration with atelectasis in the lung bases. Electronically Signed   By: Burman Nieves M.D.   On: 03/04/2018 02:26   Ct Head Wo Contrast  Result Date: 03/04/2018 CLINICAL DATA:  Headaches. EXAM: CT HEAD WITHOUT CONTRAST TECHNIQUE: Contiguous axial images were obtained from the base of the skull through the vertex without intravenous contrast. COMPARISON:  None. FINDINGS: Brain: The ventricles are normal in size and configuration. No extra-axial fluid collections are identified. The gray-white differentiation is maintained. No CT findings for acute hemispheric infarction or intracranial hemorrhage. No mass lesions. The brainstem and cerebellum are normal.  Vascular: No hyperdense vessels or obvious aneurysm. Skull: No acute skull fracture.  No bone lesion. Sinuses/Orbits: The paranasal sinuses and mastoid air cells are clear except for scattered mucoperiosteal thickening involving the right ethmoid air cells and also partial opacification of the right maxillary sinus. The mastoid air cells and middle ear cavities are clear. The globes are intact. Other: Soft tissue density in the left posterior parietal region of  the scalp could be a small scalp hematoma although there is no stated history of trauma. There is a similar finding in the left lower occipital area. IMPRESSION: 1. No acute intracranial findings or mass lesion. 2. Scattered sinus disease. 3. 2 left-sided scalp densities could be small scalp hematomas or scar tissue related to prior trauma. 4. No skull fracture or bone lesions. Electronically Signed   By: Rudie Meyer M.D.   On: 03/04/2018 08:47       Assessment / Plan:    #23 37 year old Hispanic male with severely decompensated alcohol induced cirrhosis and acute alcoholic hepatitis. Meld yesterday calculated at 20 and discriminant function score 36  He has been started on prednisolone 40 mg p.o. Daily  He is mentating well and has no active GI complaints. Hepatitis A B and C serologies negative AFP within normal limits  Long conversation with patient and his daughter today regarding the severity of his liver disease, and increased mortality risk.  He is advised that he absolutely must discontinue all alcohol use forever moving forward.  He is aware that it may take several days for [place] to know if his liver is going to be able to show any improvement.   #2 severe coagulopathy-tremors worse today continue vitamin K daily x3 days  #3 severe thrombocytopenia with platelet count of 13,000 on presentation-in part secondary to splenic sequestration with significant splenomegaly He has had some minimal improvement since yesterday  #4 macrocytic anemia  #5 surveillance for esophageal varices-will need EGD at some point when platelet count recovers, this can be done outpatient  #6 non-tense ascites-Ideally should have paracentesis at least for diagnostic purposes.  Will hold until platelet count recovers.  He was started on SBP prophylaxis on admission.  Given the severity of his decompensation, and significant mortality risk, and known increased susceptibility to infection, I think it would be  appropriate to continue SBP prophylaxis.  He can be switched to oral Cipro 500 mg once daily or norfloxacin 400 mg p.o. daily   Will advance to 2 g sodium regular diet  We will continue to follow    Contact  Shia Eber, P.A.-C               [phone]      Principal Problem:   Decompensated liver disease (HCC) Active Problems:   Alcoholic cirrhosis of liver with ascites (HCC)   Thrombocytopenia (HCC)   Alcoholic hepatitis with ascites     LOS: 1 day   Alf Doyle  03/05/2018, 9:48 AM

## 2018-03-06 ENCOUNTER — Inpatient Hospital Stay (HOSPITAL_COMMUNITY): Payer: Self-pay

## 2018-03-06 ENCOUNTER — Encounter: Payer: Self-pay | Admitting: Nurse Practitioner

## 2018-03-06 ENCOUNTER — Other Ambulatory Visit: Payer: Self-pay | Admitting: Nurse Practitioner

## 2018-03-06 DIAGNOSIS — K7031 Alcoholic cirrhosis of liver with ascites: Secondary | ICD-10-CM

## 2018-03-06 LAB — COMPREHENSIVE METABOLIC PANEL
ALT: 44 U/L (ref 0–44)
AST: 109 U/L — ABNORMAL HIGH (ref 15–41)
Albumin: 2 g/dL — ABNORMAL LOW (ref 3.5–5.0)
Alkaline Phosphatase: 74 U/L (ref 38–126)
Anion gap: 6 (ref 5–15)
BUN: 9 mg/dL (ref 6–20)
CO2: 22 mmol/L (ref 22–32)
Calcium: 7.7 mg/dL — ABNORMAL LOW (ref 8.9–10.3)
Chloride: 111 mmol/L (ref 98–111)
Creatinine, Ser: 0.38 mg/dL — ABNORMAL LOW (ref 0.61–1.24)
GFR calc non Af Amer: >60 mL/min (ref >60)
Glucose, Bld: 84 mg/dL (ref 70–99)
Potassium: 3.1 mmol/L — ABNORMAL LOW (ref 3.5–5.1)
SODIUM: 139 mmol/L (ref 135–145)
Total Bilirubin: 7.4 mg/dL — ABNORMAL HIGH (ref 0.3–1.2)
Total Protein: 6 g/dL — ABNORMAL LOW (ref 6.5–8.1)

## 2018-03-06 LAB — CBC
HEMATOCRIT: 30.6 % — AB (ref 39.0–52.0)
HEMOGLOBIN: 10.4 g/dL — AB (ref 13.0–17.0)
MCH: 36.5 pg — AB (ref 26.0–34.0)
MCHC: 34 g/dL (ref 30.0–36.0)
MCV: 107.4 fL — ABNORMAL HIGH (ref 80.0–100.0)
Platelets: 28 10*3/uL — CL (ref 150–400)
RBC: 2.85 MIL/uL — ABNORMAL LOW (ref 4.22–5.81)
RDW: 15.7 % — ABNORMAL HIGH (ref 11.5–15.5)
WBC: 5 10*3/uL (ref 4.0–10.5)
nRBC: 0 % (ref 0.0–0.2)

## 2018-03-06 LAB — PROTIME-INR
INR: 2.68
Prothrombin Time: 28.1 seconds — ABNORMAL HIGH (ref 11.4–15.2)

## 2018-03-06 MED ORDER — POTASSIUM CHLORIDE CRYS ER 20 MEQ PO TBCR
40.0000 meq | EXTENDED_RELEASE_TABLET | Freq: Two times a day (BID) | ORAL | Status: DC
  Administered 2018-03-06 – 2018-03-07 (×3): 40 meq via ORAL
  Filled 2018-03-06 (×3): qty 2

## 2018-03-06 NOTE — Progress Notes (Signed)
PROGRESS NOTE    Jason [name]  ZOX:[id number] DOB: 1981-04-01 DOA: 03/04/2018 PCP: Patient, No Pcp Per      Brief Narrative:  Jason [name] is a 37 y.o. M with alcoholic cirrhosis presents with abdominal swelling, leg swelling, malaise, found to have encephalopathy, platelets 13K, gums bleeding.  MELD 20, Maddrey discriminant 36.   Started on steroids and admitted for acute alcoholic liver failure.    Assessment & Plan:  Alcoholic liver failure Alcoholic cirrhosis Severe and life-threatening liver failure, stabilized.  MELD 20.  High short term risk of death.    Mentation improved.  Admitted and started on prednisolone for alcoholic hepatitis, diuretics for edema, lactulose for encephalopathy, Cipro for SBP prophylaxis.  Ceased alcohol and platelets are somewhat better.  Hasn't needed any lorazepam for withdrawal.  -Continue lactulose -Continue Lasix, spironolactone -Daily BMP -Consult Gastroenterology, appreciate expert guidance -Complete alcohol cessation was reinforced   Alcoholic hepatitis Maddrey discriminant function 36.   -Continue prednisolone for 30 days (until Dec 4), the re-eval per GI  Thrombocytopenia Severe.  Got 1 units platelets at admission, no bleeding at all since.    -FFP and platelet transfusion for any clinically significant symptomatic bleeding -Discussed with Heme, can use Amicar if gums bleeding becomes problem -Daily CBC  Alcohol dependence Has hx withdrawals, no clear history of DTs.  No real withdrawal here, lorazepam only once per CIWA scale. -Continue folate, thiamine -Continue CIWA  Hypokalemia -Supplement K again       DVT prophylaxis: SCDs Code Status: FULL Family Communication: None present MDM and disposition Plan:  The below labs and imaging reprots were reviewe dand summarized above.  Medication management as above.  Case discussed with gastroenterology.    He was admitted with severe and life-threatening liver  failure.  He has a high risk of deterioration and death.  This is higher if he continues using alcohol.   All history collected throgh videophonic interpreter.      Objective: Vitals:   03/05/18 2044 03/06/18 0000 03/06/18 0541 03/06/18 1349  BP: 132/77  131/76 122/70  Pulse: 80 82 73 73  Resp: 20  20 20   Temp: 98.2 F (36.8 C)  98.9 F (37.2 C) 99.1 F (37.3 C)  TempSrc:    Oral  SpO2: 99%  99% 98%  Weight:        Intake/Output Summary (Last 24 hours) at 03/06/2018 1501 Last data filed at 03/05/2018 2314 Gross per 24 hour  Intake 360 ml  Output -  Net 360 ml   Filed Weights   03/04/18 0213 03/05/18 0500  Weight: 104.8 kg 105.6 kg    Examination:   General: Adult male, sitting in bed, interactive, no acute distress. HEENT: Corneas clear, conjunctive and sclera normal, icterus noted, lids and lashes normal.  Visual tracking smooth.  Oropharynx tacky dry, no oral lesions, hearing normal. Cardiac: Heart rate regular, no murmurs, JVP normal, trace nonpitting lower extremity edema. Respiratory: Respirations normal, effort normal, lungs clear without rales or wheezes. Abdomen: Mild ascites, no tense ascites.  No hepatosplenomegaly or tenderness on exam, although from imaging it is noted that he has splenomegaly and hepatomegaly. Extremities: Large scar in the left shin otherwise no deformities/injuries.  5/5 grip strength and upper extremity flexion/extension, symmetrically.  Extremities are warm and well-perfused. Neuro: Speech is fluent in [place].  Strength appears 5/5 and symmetric in the upper and lower extremities.  No asterixis.Marland Kitchen Psych: Attention and affect is normal, thought process linear.     Data Reviewed: I  have personally reviewed following labs and imaging studies:  CBC: Recent Labs  Lab 03/04/18 0241 03/04/18 0739 03/05/18 0619 03/06/18 0647  WBC 5.1 4.9 5.2 5.0  NEUTROABS 3.7  --   --   --   HGB 11.4* 10.3* 10.2* 10.4*  HCT 33.2* 30.1* 29.7* 30.6*    MCV 103.8* 104.9* 103.8* 107.4*  PLT 13* 13* 25* 28*   Basic Metabolic Panel: Recent Labs  Lab 03/04/18 0241 03/04/18 1553 03/05/18 0619 03/06/18 0647  NA 140 142 141 139  K 3.0* 3.1* 3.1* 3.1*  CL 110 112* 112* 111  CO2 18* 22 22 22   GLUCOSE 102* 97 91 84  BUN 8 9 9 9   CREATININE 0.73 0.48* 0.48* 0.38*  CALCIUM 7.6* 7.5* 7.6* 7.7*  MG  --  1.4*  --   --    GFR: CrCl cannot be calculated (Unknown ideal weight.). Liver Function Tests: Recent Labs  Lab 03/04/18 0241 03/04/18 0739 03/05/18 0619 03/06/18 0647  AST 167* 153* 118* 109*  ALT 53* 51* 43 44  ALKPHOS 153* 123 72 74  BILITOT 5.2* 4.8* 7.4* 7.4*  PROT 6.8 6.0* 6.1* 6.0*  ALBUMIN 2.2* 1.9* 2.2* 2.0*   Recent Labs  Lab 03/04/18 0241  LIPASE 79*   Recent Labs  Lab 03/04/18 1236  AMMONIA 57*   Coagulation Profile: Recent Labs  Lab 03/04/18 0241 03/04/18 1553 03/05/18 0619 03/06/18 0647  INR 1.92 1.98 2.52 2.68   Cardiac Enzymes: No results for input(s): CKTOTAL, CKMB, CKMBINDEX, TROPONINI in the last 168 hours. BNP (last 3 results) No results for input(s): PROBNP in the last 8760 hours. HbA1C: No results for input(s): HGBA1C in the last 72 hours. CBG: No results for input(s): GLUCAP in the last 168 hours. Lipid Profile: No results for input(s): CHOL, HDL, LDLCALC, TRIG, CHOLHDL, LDLDIRECT in the last 72 hours. Thyroid Function Tests: No results for input(s): TSH, T4TOTAL, FREET4, T3FREE, THYROIDAB in the last 72 hours. Anemia Panel: No results for input(s): VITAMINB12, FOLATE, FERRITIN, TIBC, IRON, RETICCTPCT in the last 72 hours. Urine analysis:    Component Value Date/Time   COLORURINE RED (A) 03/04/2018 0154   APPEARANCEUR HAZY (A) 03/04/2018 0154   LABSPEC 1.008 03/04/2018 0154   PHURINE 6.0 03/04/2018 0154   GLUCOSEU 50 (A) 03/04/2018 0154   HGBUR LARGE (A) 03/04/2018 0154   BILIRUBINUR NEGATIVE 03/04/2018 0154   KETONESUR NEGATIVE 03/04/2018 0154   PROTEINUR 100 (A) 03/04/2018  0154   NITRITE NEGATIVE 03/04/2018 0154   LEUKOCYTESUR NEGATIVE 03/04/2018 0154   Sepsis Labs: @LABRCNTIP (procalcitonin:4,lacticacidven:4)  )No results found for this or any previous visit (from the past 240 hour(s)).       Radiology Studies: US Abdomen Limited Ruq  Result Date: 03/06/2018 CLINICAL DATA:  Alcoholic cirrhosis. EXAM: ULTRASOUND ABDOMEN LIMITED RIGHT UPPER QUADRANT COMPARISON:  Complete abdomen ultrasound dated 06/27/2017. FINDINGS: Gallbladder: Decreased distention of the gallbladder with progressive diffuse gallbladder wall thickening, measuring 9.1 mm in maximum thickness today. There is also a mildly thickened internal septation in the gallbladder. There is also a 3 mm gallbladder polyp. No gallstones or sonographic Murphy sign. No pericholecystic fluid. Common bile duct: Diameter: 5.7 mm Liver: Again demonstrated is a nodular liver contour and mild heterogeneity of the liver. No masses are seen. Portal vein is patent on color Doppler imaging with normal direction of blood flow towards the liver. Other: Right pleural effusion. Small amount of ascites with significant improvement. IMPRESSION: 1. Poor distention of the gallbladder with progressive diffuse gallbladder wall thickening.  This is most likely due to hypoproteinemia. This can also be seen with chronic acalculous cholecystitis and acute hepatitis. 2. No liver masses seen. 3. Stable changes of cirrhosis of the liver. 4. Small amount of ascites with significant improvement. 5. Right pleural effusion. Electronically Signed   By: Beckie Salts M.D.   On: 03/06/2018 10:47        Scheduled Meds: . ciprofloxacin  500 mg Oral Q breakfast  . folic acid  1 mg Oral Daily  . furosemide  40 mg Oral Daily  . lactulose  20 g Oral BID  . multivitamin with minerals  1 tablet Oral Daily  . pantoprazole  40 mg Oral Q0600  . potassium chloride  40 mEq Oral BID  . prednisoLONE  40 mg Oral Q breakfast  . spironolactone  100 mg Oral  Daily  . thiamine  100 mg Oral Daily   Or  . thiamine  100 mg Intravenous Daily   Continuous Infusions:    LOS: 2 days    Time spent: 25 minutes    Alberteen Sam, MD [facility] 03/06/2018, 3:01 PM     Pager [phone] --- please page though AMION:  [email or web address] If 7PM-7AM, please contact night-coverage

## 2018-03-06 NOTE — Progress Notes (Signed)
     [facility] Progress Note   Chief Complaint:   cirrhosis  SUBJECTIVE:    no pain, no N/V. Feels okay Ardyth Harps)   ASSESSMENT AND PLAN:   37 yo Hispanic male with decompensated ETOH / superimposed ETOH hepatitis. MELD 25. DF > 32 (in the 60's based on most current labs) -on prednisolone 40 mg daily -started diuretics yesterday (40 / 100). Renal function normal today. Urine o/p yesterday 400 mg. Need to monitor renal function closely (only small amount of ascites on ultrasound and no peripheral edema).  -will change from regular to low sodium diet -HCC screening: AFP normal 5.0. No liver masses on U/S -Varices screening: needs eventual EGD but platelets have to be higher.  -continue lactulose -Maybe home soon. Will need BMET, CBC, INR. I put orders in Epic.  -Follow up appt made with me  On 03/24/18 at 11am  OBJECTIVE:     Vital signs in last 24 hours: Temp:  [98.2 F (36.8 C)-99.3 F (37.4 C)] 98.9 F (37.2 C) (11/07 0541) Pulse Rate:  [73-92] 73 (11/07 0541) Resp:  [14-20] 20 (11/07 0541) BP: (119-132)/(62-77) 131/76 (11/07 0541) SpO2:  [99 %] 99 % (11/07 0541) Last BM Date: 03/05/18 General:   Alert, well-developed male in NAD Heart:  Regular rate and rhythm; no murmur.  No lower extremity edema   Pulm: Normal respiratory effort, lungs CTA bilaterally without wheezes or crackles. Abdomen:  Soft, mildly distended.  Normal bowel sounds, no masses felt.       Neurologic:  Alert. Pleasant, grossly normal neurologically. Psych:  Pleasant, cooperative.  Normal mood and affect.   Intake/Output from previous day: 11/06 0701 - 11/07 0700 In: 854.7 [P.O.:720; IV Piggyback:134.7] Out: 400 [Urine:400] Intake/Output this shift: No intake/output data recorded.  Lab Results: Recent Labs    03/04/18 0739 03/05/18 0619 03/06/18 0647  WBC 4.9 5.2 5.0  HGB 10.3* 10.2* 10.4*  HCT 30.1* 29.7* 30.6*  PLT 13* 25* 28*   BMET Recent Labs    03/04/18 1553  03/05/18 0619 03/06/18 0647  NA 142 141 139  K 3.1* 3.1* 3.1*  CL 112* 112* 111  CO2 22 22 22   GLUCOSE 97 91 84  BUN 9 9 9   CREATININE 0.48* 0.48* 0.38*  CALCIUM 7.5* 7.6* 7.7*   LFT Recent Labs    03/04/18 0739  03/06/18 0647  PROT 6.0*   < > 6.0*  ALBUMIN 1.9*   < > 2.0*  AST 153*   < > 109*  ALT 51*   < > 44  ALKPHOS 123   < > 74  BILITOT 4.8*   < > 7.4*  BILIDIR 2.2*  --   --   IBILI 2.6*  --   --    < > = values in this interval not displayed.   PT/INR Recent Labs    03/05/18 0619 03/06/18 0647  LABPROT 26.8* 28.1*  INR 2.52 2.68   Hepatitis Panel Recent Labs    03/04/18 0739  HEPBSAG Negative  HCVAB <0.1  HEPAIGM Negative  HEPBIGM Negative   Principal Problem:   Decompensated liver disease (HCC) Active Problems:   Alcoholic cirrhosis of liver with ascites (HCC)   Thrombocytopenia (HCC)   Alcoholic hepatitis with ascites     LOS: 2 days   Willette Cluster ,NP 03/06/2018, 10:47 AM

## 2018-03-07 DIAGNOSIS — D696 Thrombocytopenia, unspecified: Secondary | ICD-10-CM

## 2018-03-07 LAB — CBC
HEMATOCRIT: 33 % — AB (ref 39.0–52.0)
HEMOGLOBIN: 11.2 g/dL — AB (ref 13.0–17.0)
MCH: 35.7 pg — AB (ref 26.0–34.0)
MCHC: 33.9 g/dL (ref 30.0–36.0)
MCV: 105.1 fL — ABNORMAL HIGH (ref 80.0–100.0)
Platelets: 27 10*3/uL — CL (ref 150–400)
RBC: 3.14 MIL/uL — ABNORMAL LOW (ref 4.22–5.81)
RDW: 15.6 % — ABNORMAL HIGH (ref 11.5–15.5)
WBC: 4.9 10*3/uL (ref 4.0–10.5)
nRBC: 0.4 % — ABNORMAL HIGH (ref 0.0–0.2)

## 2018-03-07 LAB — COMPREHENSIVE METABOLIC PANEL
ALK PHOS: 79 U/L (ref 38–126)
ALT: 50 U/L — AB (ref 0–44)
AST: 110 U/L — ABNORMAL HIGH (ref 15–41)
Albumin: 1.9 g/dL — ABNORMAL LOW (ref 3.5–5.0)
Anion gap: 7 (ref 5–15)
BUN: 9 mg/dL (ref 6–20)
CALCIUM: 7.6 mg/dL — AB (ref 8.9–10.3)
CO2: 19 mmol/L — AB (ref 22–32)
Chloride: 112 mmol/L — ABNORMAL HIGH (ref 98–111)
Creatinine, Ser: 0.41 mg/dL — ABNORMAL LOW (ref 0.61–1.24)
GFR calc non Af Amer: >60 mL/min (ref >60)
Glucose, Bld: 87 mg/dL (ref 70–99)
Potassium: 3.4 mmol/L — ABNORMAL LOW (ref 3.5–5.1)
SODIUM: 138 mmol/L (ref 135–145)
Total Bilirubin: 6.7 mg/dL — ABNORMAL HIGH (ref 0.3–1.2)
Total Protein: 5.9 g/dL — ABNORMAL LOW (ref 6.5–8.1)

## 2018-03-07 LAB — PROTIME-INR
INR: 2.57
Prothrombin Time: 27.3 seconds — ABNORMAL HIGH (ref 11.4–15.2)

## 2018-03-07 MED ORDER — SPIRONOLACTONE 100 MG PO TABS: 100.0000 mg | ORAL_TABLET | Freq: Every day | ORAL | 11 refills | Status: DC

## 2018-03-07 MED ORDER — FUROSEMIDE 40 MG PO TABS: 40.0000 mg | ORAL_TABLET | Freq: Every day | ORAL | 11 refills | Status: DC

## 2018-03-07 MED ORDER — POTASSIUM CHLORIDE CRYS ER 20 MEQ PO TBCR: 40.0000 meq | EXTENDED_RELEASE_TABLET | Freq: Every day | ORAL | 11 refills | Status: DC

## 2018-03-07 MED ORDER — PREDNISOLONE 5 MG PO TABS: 40.0000 mg | ORAL_TABLET | Freq: Every day | ORAL | 0 refills | Status: AC

## 2018-03-07 MED ORDER — CIPROFLOXACIN HCL 500 MG PO TABS: 500.0000 mg | ORAL_TABLET | Freq: Every day | ORAL | 11 refills | Status: DC

## 2018-03-07 MED ORDER — LACTULOSE 10 GM/15ML PO SOLN: 20.0000 g | Freq: Two times a day (BID) | ORAL | 11 refills | Status: DC

## 2018-03-07 NOTE — Progress Notes (Signed)
[facility] Progress Note    Since last GI note: MS much improved.   Feels overall well.  No overt encephalopathy or withdrawal signs currently. No abd pains.  Objective: Vital signs in last 24 hours: Temp:  [98 F (36.7 C)-99.1 F (37.3 C)] 98.2 F (36.8 C) (11/08 0500) Pulse Rate:  [67-82] 67 (11/08 0500) Resp:  [18-20] 18 (11/08 0500) BP: (122-134)/(70-78) 132/78 (11/08 0500) SpO2:  [98 %-100 %] 100 % (11/08 0500) Weight:  [102.9 kg] 102.9 kg (11/08 0500) Last BM Date: 03/05/18 General: alert and oriented times 3 Heart: regular rate and rythm Abdomen: soft, non-tender, non-distended, normal bowel sounds   Lab Results: Recent Labs    03/05/18 0619 03/06/18 0647 03/07/18 0630  WBC 5.2 5.0 4.9  HGB 10.2* 10.4* 11.2*  PLT 25* 28* 27*  MCV 103.8* 107.4* 105.1*   Recent Labs    03/05/18 0619 03/06/18 0647 03/07/18 0630  NA 141 139 138  K 3.1* 3.1* 3.4*  CL 112* 111 112*  CO2 22 22 19*  GLUCOSE 91 84 87  BUN 9 9 9   CREATININE 0.48* 0.38* 0.41*  CALCIUM 7.6* 7.7* 7.6*   Recent Labs    03/05/18 0619 03/06/18 0647 03/07/18 0630  PROT 6.1* 6.0* 5.9*  ALBUMIN 2.2* 2.0* 1.9*  AST 118* 109* 110*  ALT 43 44 50*  ALKPHOS 72 74 79  BILITOT 7.4* 7.4* 6.7*   Recent Labs    03/06/18 0647 03/07/18 0630  INR 2.68 2.57     Studies/Results: US Abdomen Limited Ruq  Result Date: 03/06/2018 CLINICAL DATA:  Alcoholic cirrhosis. EXAM: ULTRASOUND ABDOMEN LIMITED RIGHT UPPER QUADRANT COMPARISON:  Complete abdomen ultrasound dated 06/27/2017. FINDINGS: Gallbladder: Decreased distention of the gallbladder with progressive diffuse gallbladder wall thickening, measuring 9.1 mm in maximum thickness today. There is also a mildly thickened internal septation in the gallbladder. There is also a 3 mm gallbladder polyp. No gallstones or sonographic Murphy sign. No pericholecystic fluid. Common bile duct: Diameter: 5.7 mm Liver: Again demonstrated is a nodular liver  contour and mild heterogeneity of the liver. No masses are seen. Portal vein is patent on color Doppler imaging with normal direction of blood flow towards the liver. Other: Right pleural effusion. Small amount of ascites with significant improvement. IMPRESSION: 1. Poor distention of the gallbladder with progressive diffuse gallbladder wall thickening. This is most likely due to hypoproteinemia. This can also be seen with chronic acalculous cholecystitis and acute hepatitis. 2. No liver masses seen. 3. Stable changes of cirrhosis of the liver. 4. Small amount of ascites with significant improvement. 5. Right pleural effusion. Electronically Signed   By: Beckie Salts M.D.   On: 03/06/2018 10:47     Medications: Scheduled Meds: . ciprofloxacin  500 mg Oral Q breakfast  . folic acid  1 mg Oral Daily  . furosemide  40 mg Oral Daily  . lactulose  20 g Oral BID  . multivitamin with minerals  1 tablet Oral Daily  . pantoprazole  40 mg Oral Q0600  . potassium chloride  40 mEq Oral BID  . prednisoLONE  40 mg Oral Q breakfast  . spironolactone  100 mg Oral Daily  . thiamine  100 mg Oral Daily   Or  . thiamine  100 mg Intravenous Daily   Continuous Infusions: PRN Meds:.ondansetron **OR** ondansetron (ZOFRAN) IV    Assessment/Plan: 37 y.o. male with severe Etoh hepatitis in setting of cirrhosis (presumed etoh related).  Hepatoma screening: 02/2018 US shows cirrhosis without masses,  AFP normal Cirrhosis etiology(very likely alcohol related): 02/2018 Hep B Surface Ag negative, HCV Ab neagtive, Hep A IgM negative, Hep B Core IgM negative Severe Alcoholic hepatitis: on prednisolone (should be total of 40 day oral course). Edema/Ascites: should stay on aldactone 100mg  daily, lasix 40mg  daily. Encephalopathy: he should stay on lactulose 20gm twice daily, probably indefinitely. He should stay on cipro 500mg  once daily, probably indefinitely for SBP prophylasis (too risk to check for SBP this admit  given very low plts) Low salt diet going forward, indefinitely. Avoid NSAIDs, indefinitely.  Absolutely it is most important that he not resume alcohol drinking.  Already has OV with [facility] 11/25 at 11am.  Ok for him to d/c from GI perspective, hopefully he will make the follow up appt in 2-3 weeks in our office and hopefully he will not resume etoh.  Please call or page with any further questions or concerns.   Rachael Fee, MD  03/07/2018, 8:49 AM  Gastroenterology Pager [phone]

## 2018-03-08 NOTE — Discharge Summary (Signed)
Physician Discharge Summary  Jason [name] ZOX:[id number] DOB: [birth year] DOA: 03/04/2018  PCP: Jason Quint, MD  Admit date: 03/04/2018 Discharge date: 03/07/2018  Admitted From: Home  Disposition:  Home   Recommendations for Outpatient Follow-up:  1. Follow up with Gastroenterology in 2 weeks 2. Follow up with new PCP as soon as able 3. Please obtain BMP/CBC at next appointment, no later than 2 weeks    Home Health: None  Equipment/Devices: None  Discharge Condition: Fair  CODE STATUS: FULL Diet recommendation: Low sodium, fluid restricted  Brief/Interim Summary: Jason [name] is a 37 y.o. M with alcoholic cirrhosis presents with abdominal swelling, leg swelling, malaise, found to have encephalopathy, platelets 13K, gums bleeding.  MELD 20, Maddrey discriminant 36.   Started on steroids and admitted for acute alcoholic liver failure.  In Feb this year, patient had been seen in ER for swelling (had been detained by [employer], told he had liver disease).  He was referred to a PCP as an outpaitent.  He made this referral, was started on furosemide/spironolactone, and referred to GI, but stopped taking medicines, did not go to GI follow up, and started using alcohol again until he returned with swelling, malaise, found to be in liver failure.     PRINCIPAL HOSPITAL DIAGNOSIS: Acute decompensated cirrhosis with alcoholic hepatitis    Discharge Diagnoses:     Alcoholic hepatitis Maddrey discriminant function 36.  GI were consulted, started prednisolone.  This will be continued 30 days, he has follow up in GI clinic.  Alcoholic liver failure Alcoholic cirrhosis Hepatitis serologies negative.  Etiology likely alcohol.  Admitted with severe and life-threatening liver failure, stabilized.  MELD 20.  High short term risk of death.  Started on lactulose, mentation improved.  Started on diuretics, swelling improved.  Started on cipro for SBP prophylaxis.  AFP negative, US  liver no masses.  Has outpatient GI follow up.  Counseled on alcohol cessation repeatedly.  Counseled extensively on medication purpose and counseled on appropriate follow up.     Thrombocytopenia From portal hypertension.  Severe.  At admission, had bleeding gums, got 1 units platelets.  No further bleeding.  Importance of monitoring for bleeding and resting from work was emphasized.    Macrocytic anemia From alcohol use.  Alcohol dependence Has hx withdrawals, no clear history of DTs.  No real withdrawal here, lorazepam only once per CIWA scale.  Complete alcohol cessation recommended.  Hypokalemia          Discharge Instructions  Discharge Instructions    Diet - low sodium heart healthy   Complete by:  As directed    Discharge instructions   Complete by:  As directed    From Dr. Maryfrances [name]: You were admitted for cirrhosis.   Cirrhosis is a form of permanent liver disease.   Without close follow up with a doctor, and taking the appropriate medicines, several different problems can arise:  1. To remove toxins from the body that the liver can't remove: Take Lactulose 20 g daily  (this is the syrup to make you have a bowel movement)(make sure you have 2-3 bowel movements per day)  2. To remove the fluid from your body that your liver can't help remove: Take furosemide/Lasix 40 mg once daily and spironolactone/Aldactone 100 mg once daily Furosemide makes your potassium low, so take potassium 40 mEq once daily too Have Jason [name] office check your blood work in 1-2 weeks to confirm that your kidney function and potassium is okay on the Lasix  3. To prevent infection from developing in your belly because of fluid around your liver: Take ciprofloxacin 500 mg once daily (This is an antibiotic)   4. Lastly, for at least the next month, take a medicine to heal the injury from alcohol: Take prednisolone 40 mg once daily    Jason [name] office has scheduled you an  appointment with a liver specialist on Nov 25:   [facility] [address] [place], [place], [place] [address] 11/25 at 11am.  Also, if you are able, call your primary care doctor for a follow up appoitnment: Primary Care at [facility] [address]  [place], [place] [phone]   Increase activity slowly   Complete by:  As directed      Allergies as of 03/07/2018   No Known Allergies     Medication List    TAKE these medications   ciprofloxacin 500 MG tablet Commonly known as:  CIPRO Take 1 tablet (500 mg total) by mouth daily with breakfast.   furosemide 40 MG tablet Commonly known as:  LASIX Take 1 tablet (40 mg total) by mouth daily. What changed:    medication strength  how much to take   lactulose 10 GM/15ML solution Commonly known as:  CHRONULAC Take 30 mLs (20 g total) by mouth 2 (two) times daily. What changed:  how much to take   potassium chloride SA 20 MEQ tablet Commonly known as:  K-DUR,KLOR-CON Take 2 tablets (40 mEq total) by mouth daily.   prednisoLONE 5 MG Tabs tablet Take 8 tablets (40 mg total) by mouth daily with breakfast.   spironolactone 100 MG tablet Commonly known as:  ALDACTONE Take 1 tablet (100 mg total) by mouth daily. What changed:    medication strength  how much to take      Follow-up Information    [facility]. Schedule an appointment as soon as possible for a visit.   Why:  call am after being discharged to make a follow up appointment and to get a primary care physician Contact information: [address] [place] [address] [place] [address] [phone]       Jason Pel, NP Follow up on 03/24/2018.   Specialty:  Gastroenterology Why:  at [facility] information: [address] [place] [address] [phone]          No Known Allergies  Consultations:  Gastroenterology   Procedures/Studies: Dg Chest 2 View  Result Date: 03/04/2018 CLINICAL DATA:   Fever and body aches for 48 hours. EXAM: CHEST - 2 VIEW COMPARISON:  04/03/2007 FINDINGS: Shallow inspiration with probable atelectasis in the lung bases. Heart size and pulmonary vascularity are normal. No consolidation or airspace disease in the lungs. No blunting of costophrenic angles. No pneumothorax. Mediastinal contours appear intact. Degenerative changes in the spine. IMPRESSION: Shallow inspiration with atelectasis in the lung bases. Electronically Signed   By: Burman Nieves M.D.   On: 03/04/2018 02:26   Ct Head Wo Contrast  Result Date: 03/04/2018 CLINICAL DATA:  Headaches. EXAM: CT HEAD WITHOUT CONTRAST TECHNIQUE: Contiguous axial images were obtained from the base of the skull through the vertex without intravenous contrast. COMPARISON:  None. FINDINGS: Brain: The ventricles are normal in size and configuration. No extra-axial fluid collections are identified. The gray-white differentiation is maintained. No CT findings for acute hemispheric infarction or intracranial hemorrhage. No mass lesions. The brainstem and cerebellum are normal. Vascular: No hyperdense vessels or obvious aneurysm. Skull: No acute skull fracture.  No  bone lesion. Sinuses/Orbits: The paranasal sinuses and mastoid air cells are clear except for scattered mucoperiosteal thickening involving the right ethmoid air cells and also partial opacification of the right maxillary sinus. The mastoid air cells and middle ear cavities are clear. The globes are intact. Other: Soft tissue density in the left posterior parietal region of the scalp could be a small scalp hematoma although there is no stated history of trauma. There is a similar finding in the left lower occipital area. IMPRESSION: 1. No acute intracranial findings or mass lesion. 2. Scattered sinus disease. 3. 2 left-sided scalp densities could be small scalp hematomas or scar tissue related to prior trauma. 4. No skull fracture or bone lesions. Electronically Signed   By: Rudie Meyer M.D.   On: 03/04/2018 08:47   US Abdomen Limited Ruq  Result Date: 03/06/2018 CLINICAL DATA:  Alcoholic cirrhosis. EXAM: ULTRASOUND ABDOMEN LIMITED RIGHT UPPER QUADRANT COMPARISON:  Complete abdomen ultrasound dated 06/27/2017. FINDINGS: Gallbladder: Decreased distention of the gallbladder with progressive diffuse gallbladder wall thickening, measuring 9.1 mm in maximum thickness today. There is also a mildly thickened internal septation in the gallbladder. There is also a 3 mm gallbladder polyp. No gallstones or sonographic Murphy sign. No pericholecystic fluid. Common bile duct: Diameter: 5.7 mm Liver: Again demonstrated is a nodular liver contour and mild heterogeneity of the liver. No masses are seen. Portal vein is patent on color Doppler imaging with normal direction of blood flow towards the liver. Other: Right pleural effusion. Small amount of ascites with significant improvement. IMPRESSION: 1. Poor distention of the gallbladder with progressive diffuse gallbladder wall thickening. This is most likely due to hypoproteinemia. This can also be seen with chronic acalculous cholecystitis and acute hepatitis. 2. No liver masses seen. 3. Stable changes of cirrhosis of the liver. 4. Small amount of ascites with significant improvement. 5. Right pleural effusion. Electronically Signed   By: Beckie Salts M.D.   On: 03/06/2018 10:47       Subjective: Feeling well.  Appetite good.  No chest pain, abdominal pain, bleeding gums, nose bleed, hematochezia, melena or hematuria.  No confusion.  No fever.  Discharge Exam: Vitals:   03/07/18 0500 03/07/18 1244  BP: 132/78 120/68  Pulse: 67 72  Resp: 18 16  Temp: 98.2 F (36.8 C) 98 F (36.7 C)  SpO2: 100% 100%   Vitals:   03/06/18 1349 03/06/18 2044 03/07/18 0500 03/07/18 1244  BP: 122/70 134/74 132/78 120/68  Pulse: 73 82 67 72  Resp: 20 19 18 16   Temp: 99.1 F (37.3 C) 98 F (36.7 C) 98.2 F (36.8 C) 98 F (36.7 C)  TempSrc: Oral  Oral Oral Oral  SpO2: 98% 100% 100% 100%  Weight:   102.9 kg     General: Pt is alert, awake, not in acute distress, sitting in chair Cardiovascular: RRR, nl S1-S2, no murmurs appreciated.   Mild LE edema.   Respiratory: Normal respiratory rate and rhythm.  CTAB without rales or wheezes. Abdominal: Abdomen soft and non-tender, mild ?ascites, nothing tense.  No distension. HSM not appreciated on exam, but known by imaging.   Neuro/Psych: Strength symmetric in upper and lower extremities.  Judgment and insight appear normal.   The results of significant diagnostics from this hospitalization (including imaging, microbiology, ancillary and laboratory) are listed below for reference.     Microbiology: No results found for this or any previous visit (from the past 240 hour(s)).   Labs: BNP (last 3 results) No results  for input(s): BNP in the last 8760 hours. Basic Metabolic Panel: Recent Labs  Lab 03/04/18 0241 03/04/18 1553 03/05/18 0619 03/06/18 0647 03/07/18 0630  NA 140 142 141 139 138  K 3.0* 3.1* 3.1* 3.1* 3.4*  CL 110 112* 112* 111 112*  CO2 18* 22 22 22  19*  GLUCOSE 102* 97 91 84 87  BUN 8 9 9 9 9   CREATININE 0.73 0.48* 0.48* 0.38* 0.41*  CALCIUM 7.6* 7.5* 7.6* 7.7* 7.6*  MG  --  1.4*  --   --   --    Liver Function Tests: Recent Labs  Lab 03/04/18 0241 03/04/18 0739 03/05/18 0619 03/06/18 0647 03/07/18 0630  AST 167* 153* 118* 109* 110*  ALT 53* 51* 43 44 50*  ALKPHOS 153* 123 72 74 79  BILITOT 5.2* 4.8* 7.4* 7.4* 6.7*  PROT 6.8 6.0* 6.1* 6.0* 5.9*  ALBUMIN 2.2* 1.9* 2.2* 2.0* 1.9*   Recent Labs  Lab 03/04/18 0241  LIPASE 79*   Recent Labs  Lab 03/04/18 1236  AMMONIA 57*   CBC: Recent Labs  Lab 03/04/18 0241 03/04/18 0739 03/05/18 0619 03/06/18 0647 03/07/18 0630  WBC 5.1 4.9 5.2 5.0 4.9  NEUTROABS 3.7  --   --   --   --   HGB 11.4* 10.3* 10.2* 10.4* 11.2*  HCT 33.2* 30.1* 29.7* 30.6* 33.0*  MCV 103.8* 104.9* 103.8* 107.4* 105.1*  PLT  13* 13* 25* 28* 27*   Cardiac Enzymes: No results for input(s): CKTOTAL, CKMB, CKMBINDEX, TROPONINI in the last 168 hours. BNP: Invalid input(s): POCBNP CBG: No results for input(s): GLUCAP in the last 168 hours. D-Dimer No results for input(s): DDIMER in the last 72 hours. Hgb A1c No results for input(s): HGBA1C in the last 72 hours. Lipid Profile No results for input(s): CHOL, HDL, LDLCALC, TRIG, CHOLHDL, LDLDIRECT in the last 72 hours. Thyroid function studies No results for input(s): TSH, T4TOTAL, T3FREE, THYROIDAB in the last 72 hours.  Invalid input(s): FREET3 Anemia work up No results for input(s): VITAMINB12, FOLATE, FERRITIN, TIBC, IRON, RETICCTPCT in the last 72 hours. Urinalysis    Component Value Date/Time   COLORURINE RED (A) 03/04/2018 0154   APPEARANCEUR HAZY (A) 03/04/2018 0154   LABSPEC 1.008 03/04/2018 0154   PHURINE 6.0 03/04/2018 0154   GLUCOSEU 50 (A) 03/04/2018 0154   HGBUR LARGE (A) 03/04/2018 0154   BILIRUBINUR NEGATIVE 03/04/2018 0154   KETONESUR NEGATIVE 03/04/2018 0154   PROTEINUR 100 (A) 03/04/2018 0154   NITRITE NEGATIVE 03/04/2018 0154   LEUKOCYTESUR NEGATIVE 03/04/2018 0154   Sepsis Labs Invalid input(s): PROCALCITONIN,  WBC,  LACTICIDVEN Microbiology No results found for this or any previous visit (from the past 240 hour(s)).   Time coordinating discharge: 60 minutes       SIGNED:   Alberteen Sam, MD  [facility] 03/07/2018, 7:00 PM

## 2018-03-24 ENCOUNTER — Other Ambulatory Visit (INDEPENDENT_AMBULATORY_CARE_PROVIDER_SITE_OTHER): Payer: Self-pay

## 2018-03-24 ENCOUNTER — Telehealth: Payer: Self-pay | Admitting: Nurse Practitioner

## 2018-03-24 ENCOUNTER — Encounter: Payer: Self-pay | Admitting: Nurse Practitioner

## 2018-03-24 ENCOUNTER — Ambulatory Visit (INDEPENDENT_AMBULATORY_CARE_PROVIDER_SITE_OTHER): Payer: Self-pay | Admitting: Nurse Practitioner

## 2018-03-24 VITALS — BP 118/62 | HR 80 | Ht 66.0 in | Wt 223.4 lb

## 2018-03-24 DIAGNOSIS — K7010 Alcoholic hepatitis without ascites: Secondary | ICD-10-CM

## 2018-03-24 DIAGNOSIS — K7030 Alcoholic cirrhosis of liver without ascites: Secondary | ICD-10-CM

## 2018-03-24 LAB — COMPREHENSIVE METABOLIC PANEL
ALT: 59 U/L — ABNORMAL HIGH (ref 0–53)
AST: 95 U/L — ABNORMAL HIGH (ref 0–37)
Albumin: 2.7 g/dL — ABNORMAL LOW (ref 3.5–5.2)
Alkaline Phosphatase: 163 U/L — ABNORMAL HIGH (ref 39–117)
BUN: 9 mg/dL (ref 6–23)
CHLORIDE: 102 meq/L (ref 96–112)
CO2: 24 meq/L (ref 19–32)
CREATININE: 0.64 mg/dL (ref 0.40–1.50)
Calcium: 8.1 mg/dL — ABNORMAL LOW (ref 8.4–10.5)
GFR: 151.26 mL/min (ref >60.00)
Glucose, Bld: 125 mg/dL — ABNORMAL HIGH (ref 70–99)
POTASSIUM: 3.7 meq/L (ref 3.5–5.1)
SODIUM: 132 meq/L — AB (ref 135–145)
Total Bilirubin: 7.1 mg/dL — ABNORMAL HIGH (ref 0.2–1.2)
Total Protein: 6.6 g/dL (ref 6.0–8.3)

## 2018-03-24 LAB — CBC
HEMATOCRIT: 39.6 % (ref 39.0–52.0)
Hemoglobin: 13.5 g/dL (ref 13.0–17.0)
MCHC: 34.2 g/dL (ref 30.0–36.0)
MCV: 106.1 fl — AB (ref 78.0–100.0)
Platelets: 34 10*3/uL — CL (ref 150.0–400.0)
RBC: 3.73 Mil/uL — ABNORMAL LOW (ref 4.22–5.81)
RDW: 16.5 % — ABNORMAL HIGH (ref 11.5–15.5)
WBC: 10 10*3/uL (ref 4.0–10.5)

## 2018-03-24 LAB — PROTIME-INR
INR: 2.2 ratio — AB (ref 0.8–1.0)
Prothrombin Time: 25.8 s — ABNORMAL HIGH (ref 9.6–13.1)

## 2018-03-24 MED ORDER — PREDNISONE 5 MG PO TABS: ORAL_TABLET | ORAL | 0 refills | Status: DC

## 2018-03-24 NOTE — Progress Notes (Signed)
Chief Complaint:    Hospital follow up  IMPRESSION and PLAN:    37 yo male recently hospitalized ETOH cirrhosis with superimposed ETOH hepatitis and profound thrombocytopenia. On Prednisolone. He certainly looks much better than when I saw him in the hospital.  -will check labs today -I got a message from Hospitalist who discharged patient from Hospital. Patient on liquid prednisolone because pills too expensive?? I will refill the liquid if need be but the pills aren't generally very expensive. Due to financial situation I will start taper a little soon then the recommended 4 weeks (assuming liver tests showing improvement). He will taper dose over 3-4 week period, written instructions given.  -will call him with lab result.  -continue current dose of diuretics (will check renal function today) -continue 2 gram sodium diet -Absolutely NO etoh indefinitely  -HCC surveillance: Normal AFP, no focal liver lesions on u/s in Feb 2019.  -Varices screening. Needs eventual EGD.Platelets will need to improve.   -continue Cipro for SBP prophylaxis for now.   HPI:     Patient is a 37 year old male who we recently met in the hospital and evaluated for ETOH with superimposed ETOH hepatitis. At the time his MELD was 20, MDF > 32, meeting criteria for steroids. He hepatitis cirrhosis with superimposed alcoholic hepatitis. He was profoundly thrombocytopenic with platelet count < 13 K. He was encephalopathic, started on Lactulose. Eventually started on low sodium diet and low dose diuretics. He was treated empirically for SBP. Paracentesis wasn't done given platelet count. His viral hepatitis studies were negative.  Ultrasound earlier this year in ED (in February) showed cirrhotic liver, splenomegaly and ascites.   Patient is here for follow up. He speaks very little [place] but called friend on phone to help translate / interpret. Patienthasn't had any ETOH since prior to last admission. He feels  well. No abdominal swelling or significant lower extremity swelling. He hasn't had any abdominal pain. He is taking diuretics, lactulose and cipro as directed. Says he is following a low salt diet.   Review of systems:     No chest pain, no SOB, no fevers, no urinary sx   Past Medical History:  Diagnosis Date  . Alcoholic cirrhosis of liver ([place])   . Hypertension     Patient's surgical history, family medical history, social history, medications and allergies were all reviewed in Epic   Serum creatinine: 0.41 mg/dL (L) 03/07/18 0630 Estimated creatinine clearance: 143.6 mL/min (A)  Current Outpatient Medications  Medication Sig Dispense Refill  . ciprofloxacin (CIPRO) 500 MG tablet Take 1 tablet (500 mg total) by mouth daily with breakfast. 30 tablet 11  . furosemide (LASIX) 40 MG tablet Take 1 tablet (40 mg total) by mouth daily. 30 tablet 11  . lactulose (CHRONULAC) 10 GM/15ML solution Take 30 mLs (20 g total) by mouth 2 (two) times daily. 240 mL 11  . potassium chloride SA (K-DUR,KLOR-CON) 20 MEQ tablet Take 2 tablets (40 mEq total) by mouth daily. 30 tablet 11  . prednisoLONE 5 MG TABS tablet Take 8 tablets (40 mg total) by mouth daily with breakfast. 280 tablet 0  . spironolactone (ALDACTONE) 100 MG tablet Take 1 tablet (100 mg total) by mouth daily. 30 tablet 11   No current facility-administered medications for this visit.     Physical Exam:     BP 118/62   Pulse 80   Ht '5\' 6"'$  (1.676 m)   Wt 223 lb 6.4 oz (101.3 kg)  BMI 36.06 kg/m   GENERAL:  Pleasant male in NAD PSYCH: : Cooperative, normal affect EENT:  Icteric scleral icterus, neck supple CARDIAC:  RRR,  no peripheral edema PULM: Normal respiratory effort, lungs CTA bilaterally, no wheezing ABDOMEN:  Soft, protuberant, not tense.  No obvious masses,   normal bowel sounds SKIN:  turgor, no lesions seen Musculoskeletal:  Normal muscle tone, normal strength NEURO: Alert and oriented x 3, no focal neurologic  deficits   Tye Savoy , NP 03/24/2018, 11:57 AM

## 2018-03-24 NOTE — Patient Instructions (Signed)
If you are age 35 or older, your body mass index should be between 23-30. Your Body mass index is 36.06 kg/m. If this is out of the aforementioned range listed, please consider follow up with your Primary Care Provider.  If you are age 35 or younger, your body mass index should be between 19-25. Your Body mass index is 36.06 kg/m. If this is out of the aformentioned range listed, please consider follow up with your Primary Care Provider.   Your provider has requested that you go to the basement level for lab work before leaving today. Press "B" on the elevator. The lab is located at the first door on the left as you exit the elevator.  Start Prednisone Taper : 30 mg for five days                                          20 mg for five days                                          10 mg for five days                                            5 mg for five days  Follow up appointment with me on April 08, 2018 at 11:00 am.  Thank you for choosing me and [facility].   Willette ClusterPaula Guenther, NP

## 2018-03-30 ENCOUNTER — Encounter: Payer: Self-pay | Admitting: Nurse Practitioner

## 2018-03-31 NOTE — Progress Notes (Signed)
I agree with the above note, plan 

## 2018-04-08 ENCOUNTER — Ambulatory Visit: Payer: Self-pay | Admitting: Nurse Practitioner

## 2018-04-08 ENCOUNTER — Encounter: Payer: Self-pay | Admitting: Nurse Practitioner

## 2018-04-30 ENCOUNTER — Inpatient Hospital Stay (HOSPITAL_COMMUNITY)
Admission: EM | Admit: 2018-04-30 | Discharge: 2018-05-09 | DRG: 371 | Disposition: A | Discharge status: Home / Self Care | Payer: Self-pay | Attending: Family Medicine | Admitting: Family Medicine

## 2018-04-30 ENCOUNTER — Encounter (HOSPITAL_COMMUNITY): Payer: Self-pay | Admitting: Obstetrics and Gynecology

## 2018-04-30 ENCOUNTER — Other Ambulatory Visit: Payer: Self-pay

## 2018-04-30 ENCOUNTER — Emergency Department (HOSPITAL_COMMUNITY): Payer: Self-pay

## 2018-04-30 DIAGNOSIS — D696 Thrombocytopenia, unspecified: Secondary | ICD-10-CM | POA: Diagnosis present

## 2018-04-30 DIAGNOSIS — E876 Hypokalemia: Secondary | ICD-10-CM | POA: Diagnosis present

## 2018-04-30 DIAGNOSIS — E8809 Other disorders of plasma-protein metabolism, not elsewhere classified: Secondary | ICD-10-CM | POA: Diagnosis present

## 2018-04-30 DIAGNOSIS — R188 Other ascites: Secondary | ICD-10-CM

## 2018-04-30 DIAGNOSIS — K7290 Hepatic failure, unspecified without coma: Secondary | ICD-10-CM

## 2018-04-30 DIAGNOSIS — K7460 Unspecified cirrhosis of liver: Secondary | ICD-10-CM | POA: Diagnosis present

## 2018-04-30 DIAGNOSIS — I10 Essential (primary) hypertension: Secondary | ICD-10-CM | POA: Diagnosis present

## 2018-04-30 DIAGNOSIS — R791 Abnormal coagulation profile: Secondary | ICD-10-CM | POA: Diagnosis present

## 2018-04-30 DIAGNOSIS — R7881 Bacteremia: Secondary | ICD-10-CM | POA: Diagnosis present

## 2018-04-30 DIAGNOSIS — K652 Spontaneous bacterial peritonitis: Principal | ICD-10-CM

## 2018-04-30 DIAGNOSIS — E875 Hyperkalemia: Secondary | ICD-10-CM | POA: Diagnosis present

## 2018-04-30 DIAGNOSIS — B9689 Other specified bacterial agents as the cause of diseases classified elsewhere: Secondary | ICD-10-CM | POA: Diagnosis present

## 2018-04-30 DIAGNOSIS — D72829 Elevated white blood cell count, unspecified: Secondary | ICD-10-CM

## 2018-04-30 DIAGNOSIS — G9341 Metabolic encephalopathy: Secondary | ICD-10-CM | POA: Diagnosis present

## 2018-04-30 DIAGNOSIS — D539 Nutritional anemia, unspecified: Secondary | ICD-10-CM | POA: Diagnosis present

## 2018-04-30 DIAGNOSIS — K7031 Alcoholic cirrhosis of liver with ascites: Secondary | ICD-10-CM | POA: Diagnosis present

## 2018-04-30 DIAGNOSIS — K766 Portal hypertension: Secondary | ICD-10-CM | POA: Diagnosis present

## 2018-04-30 DIAGNOSIS — F1021 Alcohol dependence, in remission: Secondary | ICD-10-CM | POA: Diagnosis present

## 2018-04-30 DIAGNOSIS — D899 Disorder involving the immune mechanism, unspecified: Secondary | ICD-10-CM | POA: Diagnosis present

## 2018-04-30 DIAGNOSIS — R161 Splenomegaly, not elsewhere classified: Secondary | ICD-10-CM | POA: Diagnosis present

## 2018-04-30 DIAGNOSIS — K7030 Alcoholic cirrhosis of liver without ascites: Secondary | ICD-10-CM | POA: Diagnosis present

## 2018-04-30 DIAGNOSIS — F1010 Alcohol abuse, uncomplicated: Secondary | ICD-10-CM | POA: Diagnosis present

## 2018-04-30 DIAGNOSIS — K7200 Acute and subacute hepatic failure without coma: Secondary | ICD-10-CM | POA: Diagnosis present

## 2018-04-30 DIAGNOSIS — K769 Liver disease, unspecified: Secondary | ICD-10-CM | POA: Diagnosis present

## 2018-04-30 LAB — CBC
HCT: 31.6 % — ABNORMAL LOW (ref 39.0–52.0)
Hemoglobin: 10.8 g/dL — ABNORMAL LOW (ref 13.0–17.0)
MCH: 35.8 pg — AB (ref 26.0–34.0)
MCHC: 34.2 g/dL (ref 30.0–36.0)
MCV: 104.6 fL — ABNORMAL HIGH (ref 80.0–100.0)
PLATELETS: 52 10*3/uL — AB (ref 150–400)
RBC: 3.02 MIL/uL — ABNORMAL LOW (ref 4.22–5.81)
RDW: 17.8 % — ABNORMAL HIGH (ref 11.5–15.5)
WBC: 24 10*3/uL — ABNORMAL HIGH (ref 4.0–10.5)
nRBC: 0.2 % (ref 0.0–0.2)

## 2018-04-30 LAB — URINALYSIS, ROUTINE W REFLEX MICROSCOPIC
Bacteria, UA: NONE SEEN
Bilirubin Urine: NEGATIVE
Glucose, UA: NEGATIVE mg/dL
Ketones, ur: NEGATIVE mg/dL
Leukocytes, UA: NEGATIVE
Nitrite: NEGATIVE
Protein, ur: NEGATIVE mg/dL
Specific Gravity, Urine: 1.012 (ref 1.005–1.030)
pH: 8 (ref 5.0–8.0)

## 2018-04-30 LAB — I-STAT CG4 LACTIC ACID, ED: LACTIC ACID, VENOUS: 2.64 mmol/L — AB (ref 0.5–1.9)

## 2018-04-30 LAB — COMPREHENSIVE METABOLIC PANEL
ALK PHOS: 160 U/L — AB (ref 38–126)
ALT: 104 U/L — ABNORMAL HIGH (ref 0–44)
AST: 110 U/L — AB (ref 15–41)
Albumin: 2.1 g/dL — ABNORMAL LOW (ref 3.5–5.0)
Anion gap: 7 (ref 5–15)
BUN: 19 mg/dL (ref 6–20)
CALCIUM: 7.9 mg/dL — AB (ref 8.9–10.3)
CO2: 24 mmol/L (ref 22–32)
Chloride: 108 mmol/L (ref 98–111)
Creatinine, Ser: 0.63 mg/dL (ref 0.61–1.24)
GFR calc Af Amer: >60 mL/min (ref >60)
GFR calc non Af Amer: >60 mL/min (ref >60)
Glucose, Bld: 160 mg/dL — ABNORMAL HIGH (ref 70–99)
Potassium: 3.1 mmol/L — ABNORMAL LOW (ref 3.5–5.1)
Sodium: 139 mmol/L (ref 135–145)
Total Bilirubin: 9.8 mg/dL — ABNORMAL HIGH (ref 0.3–1.2)
Total Protein: 5.6 g/dL — ABNORMAL LOW (ref 6.5–8.1)

## 2018-04-30 LAB — LIPASE, BLOOD: Lipase: 71 U/L — ABNORMAL HIGH (ref 11–51)

## 2018-04-30 MED ORDER — IOPAMIDOL (ISOVUE-300) INJECTION 61%
INTRAVENOUS | Status: AC
  Filled 2018-04-30: qty 100

## 2018-04-30 MED ORDER — IOPAMIDOL (ISOVUE-300) INJECTION 61%
100.0000 mL | Freq: Once | INTRAVENOUS | Status: AC | PRN
  Administered 2018-04-30: 100 mL via INTRAVENOUS

## 2018-04-30 MED ORDER — SODIUM CHLORIDE (PF) 0.9 % IJ SOLN
INTRAMUSCULAR | Status: AC
  Filled 2018-04-30: qty 50

## 2018-04-30 MED ORDER — METOCLOPRAMIDE HCL 5 MG/ML IJ SOLN
10.0000 mg | INTRAMUSCULAR | Status: AC
  Administered 2018-05-01: 10 mg via INTRAVENOUS
  Filled 2018-04-30: qty 2

## 2018-04-30 MED ORDER — ONDANSETRON HCL 4 MG/2ML IJ SOLN
4.0000 mg | Freq: Once | INTRAMUSCULAR | Status: AC
  Administered 2018-04-30: 4 mg via INTRAVENOUS
  Filled 2018-04-30: qty 2

## 2018-04-30 MED ORDER — SODIUM CHLORIDE 0.9 % IV BOLUS
2000.0000 mL | Freq: Once | INTRAVENOUS | Status: AC
  Administered 2018-04-30: 2000 mL via INTRAVENOUS

## 2018-04-30 MED ORDER — SODIUM CHLORIDE 0.9 % IV SOLN
2.0000 g | Freq: Once | INTRAVENOUS | Status: AC
  Administered 2018-05-01: 2 g via INTRAVENOUS
  Filled 2018-04-30: qty 2

## 2018-04-30 MED ORDER — MORPHINE SULFATE (PF) 4 MG/ML IV SOLN
4.0000 mg | Freq: Once | INTRAVENOUS | Status: AC
  Administered 2018-04-30: 4 mg via INTRAVENOUS
  Filled 2018-04-30: qty 1

## 2018-04-30 NOTE — ED Triage Notes (Signed)
Pt reports he was drinking in excess and then began having problems 4 months ago so he stopped. Pt reports he has not been drinking today. Pt has sclera discoloration and abdominal distention with nausea and emesis.  Pt is spanish speaking.  Pt reports he has seen a GI doctor once.  Pt reports pain 10/10 in his abdomen.

## 2018-04-30 NOTE — ED Notes (Addendum)
Lactic acid 2.64 mmol/L. Frederick Peersachel Little, MD and Devoria Albeaylor Hamlett, RN notified.

## 2018-04-30 NOTE — ED Provider Notes (Signed)
[facility]-EMERGENCY DEPT Provider Note   CSN: [id number] Arrival date & time: 04/30/18  1938     History   Chief Complaint Chief Complaint  Patient presents with  . Abdominal Pain  . Emesis    HPI Jason [name] is a 38 y.o. male.  38 year old male with history of cirrhosis who presents with abdominal pain and vomiting.  Patient states that yesterday morning around 2 AM he began having severe nausea.  Around 4 AM he began having generalized abdominal pain that is constant and has become severe.  He has not had a lot of vomiting.  He denies any diarrhea or blood in his stool.  He states that 6 months ago he was diagnosed with cirrhosis and 2 months ago he saw a doctor in a clinic but has not seen his doctor recently.  He was started on 4 medicines, he ran out of 1 of those medications yesterday.  He has not had alcohol in 4-1/2 months.  He denies any history of IV drug use.  No fevers.  The history is provided by the patient. A language interpreter was used.  Abdominal Pain   Associated symptoms include vomiting.  Emesis   Associated symptoms include abdominal pain.    History reviewed. No pertinent past medical history.  There are no active problems to display for this patient.   History reviewed. No pertinent surgical history.      Home Medications    Prior to Admission medications   Not on File    Family History No family history on file.  Social History Social History   Tobacco Use  . Smoking status: Never Smoker  . Smokeless tobacco: Never Used  Substance Use Topics  . Alcohol use: Yes  . Drug use: Never     Allergies   Patient has no allergy information on record.   Review of Systems Review of Systems  Gastrointestinal: Positive for abdominal pain and vomiting.   All other systems reviewed and are negative except that which was mentioned in HPI   Physical Exam Updated Vital Signs BP (!) 157/68 (BP Location: Right Arm)    Pulse (!) 104   Temp 98.6 F (37 C) (Oral)   Resp 16   SpO2 98%   Physical Exam Vitals signs and nursing note reviewed.  Constitutional:      General: He is not in acute distress.    Appearance: He is well-developed.     Comments: Retching, in mild distress due to pain  HENT:     Head: Normocephalic and atraumatic.     Mouth/Throat:     Comments: Dry mouth Eyes:     General: Scleral icterus present.     Conjunctiva/sclera: Conjunctivae normal.  Neck:     Musculoskeletal: Neck supple.  Cardiovascular:     Rate and Rhythm: Normal rate and regular rhythm.     Heart sounds: Normal heart sounds. No murmur.  Pulmonary:     Effort: Pulmonary effort is normal.     Breath sounds: Normal breath sounds.  Abdominal:     General: Abdomen is protuberant. Bowel sounds are normal. There is distension.     Tenderness: There is abdominal tenderness. There is no guarding.     Comments: Severely distended abdomen with anasarca, generalized abdominal tenderness  Musculoskeletal:     Comments: 2+ pitting edema BLE  Skin:    General: Skin is warm and dry.     Coloration: Skin is jaundiced.  Neurological:  Mental Status: He is alert and oriented to person, place, and time.     Comments: Fluent speech  Psychiatric:        Judgment: Judgment normal.      ED Treatments / Results  Labs (all labs ordered are listed, but only abnormal results are displayed) Labs Reviewed  LIPASE, BLOOD - Abnormal; Notable for the following components:      Result Value   Lipase 71 (*)    All other components within normal limits  COMPREHENSIVE METABOLIC PANEL - Abnormal; Notable for the following components:   Potassium 3.1 (*)    Glucose, Bld 160 (*)    Calcium 7.9 (*)    Total Protein 5.6 (*)    Albumin 2.1 (*)    AST 110 (*)    ALT 104 (*)    Alkaline Phosphatase 160 (*)    Total Bilirubin 9.8 (*)    All other components within normal limits  CBC - Abnormal; Notable for the following  components:   WBC 24.0 (*)    RBC 3.02 (*)    Hemoglobin 10.8 (*)    HCT 31.6 (*)    MCV 104.6 (*)    MCH 35.8 (*)    RDW 17.8 (*)    Platelets 52 (*)    All other components within normal limits  URINALYSIS, ROUTINE W REFLEX MICROSCOPIC - Abnormal; Notable for the following components:   Hgb urine dipstick SMALL (*)    All other components within normal limits  I-STAT CG4 LACTIC ACID, ED - Abnormal; Notable for the following components:   Lactic Acid, Venous 2.64 (*)    All other components within normal limits  CULTURE, BLOOD (ROUTINE X 2)  CULTURE, BLOOD (ROUTINE X 2)  BODY FLUID CULTURE  LACTATE DEHYDROGENASE, PLEURAL OR PERITONEAL FLUID  GLUCOSE, PLEURAL OR PERITONEAL FLUID  PROTEIN, PLEURAL OR PERITONEAL FLUID  ALBUMIN, PLEURAL OR PERITONEAL FLUID  PROTIME-INR  LACTIC ACID, PLASMA  LACTIC ACID, PLASMA    EKG None  Radiology Ct Abdomen Pelvis W Contrast  Result Date: 04/30/2018 CLINICAL DATA:  Cirrhosis. Nausea, vomiting and abdominal pain and distention. EXAM: CT ABDOMEN AND PELVIS WITH CONTRAST TECHNIQUE: Multidetector CT imaging of the abdomen and pelvis was performed using the standard protocol following bolus administration of intravenous contrast. CONTRAST:  100mL ISOVUE-300 IOPAMIDOL (ISOVUE-300) INJECTION 61% COMPARISON:  None. FINDINGS: Lower chest: No significant pulmonary nodules or acute consolidative airspace disease. Small lower esophageal varices. Hepatobiliary: Liver surface is diffusely irregular compatible with cirrhosis. Two scattered subcentimeter hypodense right liver lobe lesions, too small to characterize. No additional liver lesions. Mild diffuse gallbladder wall thickening. No gallbladder distention. No radiopaque cholelithiasis. No biliary ductal dilatation. Pancreas: Normal, with no mass or duct dilation. Spleen: Mild splenomegaly (craniocaudal splenic length 16.1 cm). No splenic mass. Adrenals/Urinary Tract: Normal adrenals. Nonobstructing stones in  the right greater than left kidneys, largest 4 mm in the lower right kidney. No hydronephrosis. No renal masses. Normal bladder. Stomach/Bowel: Stomach mildly distended by fluid and otherwise normal. Normal caliber small bowel. Mild wall thickening throughout the proximal to mid small bowel. Normal appendix. Normal large bowel with no diverticulosis, large bowel wall thickening or pericolonic fat stranding. Vascular/Lymphatic: Normal caliber abdominal aorta. Patent portal, splenic, hepatic and renal veins. Large paraumbilical varices. Large proximal gastric varices. No pathologically enlarged lymph nodes in the abdomen or pelvis. Reproductive: Normal size prostate Other: No pneumoperitoneum. Moderate volume simple ascites. No focal fluid collection. Anasarca. Musculoskeletal: No aggressive appearing focal osseous lesions. Sequela  of congenital right hip dysplasia. Mild thoracic spondylosis. IMPRESSION: 1. Cirrhosis. Two subcentimeter hypodense right liver lobe lesions, too small to characterize. Recommend follow-up MRI abdomen without and with IV contrast in 3-6 months. This recommendation follows ACR consensus guidelines: Managing Incidental Findings on Abdominal CT: White Paper of the ACR Incidental Findings Committee. J Am Coll Radiol 2010;7:754-773. 2. Stigmata of portal hypertension including mild splenomegaly, moderate volume ascites and large paraumbilical and proximal gastric and small lower esophageal varices. 3. Mild wall thickening throughout the proximal to mid small bowel. Mild wall thickening in gallbladder. Anasarca. These findings are nonspecific and probably due to noninflammatory edema (secondary to hypoalbuminemia). 4. Nonobstructing bilateral renal stones. No hydronephrosis. Electronically Signed   By: Delbert PhenixJason A Poff M.D.   On: 04/30/2018 23:01    Procedures .Paracentesis Date/Time: 05/01/2018 12:08 AM Performed by: Laurence SpatesLittle, Karenann Mcgrory Morgan, MD Authorized by: Laurence SpatesLittle, Ariah Mower Morgan, MD    Consent:    Consent obtained:  Written   Consent given by:  Patient   Risks discussed:  Bleeding, bowel perforation, infection and pain   Alternatives discussed:  No treatment Pre-procedure details:    Procedure purpose:  Diagnostic   Preparation: Patient was prepped and draped in usual sterile fashion   Anesthesia (see MAR for exact dosages):    Anesthesia method:  Local infiltration   Local anesthetic:  Lidocaine 1% w/o epi Procedure details:    Needle gauge:  18   Ultrasound guidance: yes     Puncture site:  R lower quadrant   Fluid removed amount:  2000 ml   Fluid appearance:  Cloudy and yellow   Dressing:  Adhesive bandage Post-procedure details:    Patient tolerance of procedure:  Tolerated well, no immediate complications Comments:     Peformed both diagnostic and therapeutic paracentesis to evaluate for SBP and also provide relief of severe abdominal distension   (including critical care time)  Medications Ordered in ED Medications  iopamidol (ISOVUE-300) 61 % injection (has no administration in time range)  sodium chloride (PF) 0.9 % injection (has no administration in time range)  ceFEPIme (MAXIPIME) 2 g in sodium chloride 0.9 % 100 mL IVPB (2 g Intravenous New Bag/Given 05/01/18 0002)  metoCLOPramide (REGLAN) injection 10 mg (has no administration in time range)  morphine 4 MG/ML injection 4 mg (has no administration in time range)  sodium chloride 0.9 % bolus 2,000 mL (0 mLs Intravenous Stopped 04/30/18 2248)  ondansetron (ZOFRAN) injection 4 mg (4 mg Intravenous Given 04/30/18 2316)  morphine 4 MG/ML injection 4 mg (4 mg Intravenous Given 04/30/18 2317)  iopamidol (ISOVUE-300) 61 % injection 100 mL (100 mLs Intravenous Contrast Given 04/30/18 2218)     Initial Impression / Assessment and Plan / ED Course  I have reviewed the triage vital signs and the nursing notes.  Pertinent labs & imaging results that were available during my care of the patient were reviewed by me  and considered in my medical decision making (see chart for details).    He was ill-appearing on exam but nontoxic.  Hypertensive, mild tachycardia, afebrile.  He had severe abdominal distention consistent with ascites and jaundice.  Initial lactate 2.64, suspect some dehydration due to his severe vomiting.  Multiple lab derangements including lipase 71, potassium 3.1, glucose 160, normal creatinine, calcium 7.9, albumin 2.1, AST 110, ALT 104, bilirubin 9.8, WBC 24,000, hemoglobin 10.8.  UA without evidence of infection.  CT of abdomen shows cirrhosis with some liver lesions.  He also has portal hypertension with ascites  and esophageal varices.  Obtain consent for paracentesis as I am concerned about SBP.  Also removed 2 L of fluid to provide symptom relief.  I have sent studies on fluid. Gave cefepime as well as morphine, zofran, reglan. Discussed admission with Dr. Antionette Char and pt admitted for further care.  Final Clinical Impressions(s) / ED Diagnoses   Final diagnoses:  SBP (spontaneous bacterial peritonitis) (HCC)  Liver failure without hepatic coma, unspecified chronicity [facility])    ED Discharge Orders    None       Witten Certain, Ambrose Finland, MD 05/01/18 [phone]

## 2018-04-30 NOTE — ED Notes (Signed)
Patient ambulated to CT

## 2018-04-30 NOTE — ED Triage Notes (Addendum)
Per EMS, patient coming from home with complaints of abdominal pain, nausea, and vomiting starting yesterday. According to EMS, patient states that he has been drinking since yesterday.   Patient has a history of cirrhosis and has jaundice in eyes and abdominal distention.

## 2018-04-30 NOTE — ED Notes (Signed)
Bed: OQ94 Expected date:  Expected time:  Means of arrival:  Comments: EMS 38 yo male from home nausea and vomiting x 1 day-jaundiced-Spanish speaking

## 2018-05-01 ENCOUNTER — Inpatient Hospital Stay (HOSPITAL_COMMUNITY): Payer: Self-pay

## 2018-05-01 ENCOUNTER — Observation Stay (HOSPITAL_COMMUNITY): Payer: Self-pay

## 2018-05-01 ENCOUNTER — Encounter (HOSPITAL_COMMUNITY): Payer: Self-pay | Admitting: Family Medicine

## 2018-05-01 DIAGNOSIS — K652 Spontaneous bacterial peritonitis: Secondary | ICD-10-CM

## 2018-05-01 DIAGNOSIS — K7290 Hepatic failure, unspecified without coma: Secondary | ICD-10-CM | POA: Insufficient documentation

## 2018-05-01 DIAGNOSIS — K7460 Unspecified cirrhosis of liver: Secondary | ICD-10-CM | POA: Diagnosis present

## 2018-05-01 DIAGNOSIS — F1021 Alcohol dependence, in remission: Secondary | ICD-10-CM | POA: Diagnosis present

## 2018-05-01 DIAGNOSIS — F1010 Alcohol abuse, uncomplicated: Secondary | ICD-10-CM | POA: Diagnosis present

## 2018-05-01 DIAGNOSIS — E876 Hypokalemia: Secondary | ICD-10-CM | POA: Diagnosis present

## 2018-05-01 DIAGNOSIS — D696 Thrombocytopenia, unspecified: Secondary | ICD-10-CM | POA: Diagnosis present

## 2018-05-01 DIAGNOSIS — D539 Nutritional anemia, unspecified: Secondary | ICD-10-CM

## 2018-05-01 DIAGNOSIS — K769 Liver disease, unspecified: Secondary | ICD-10-CM | POA: Diagnosis present

## 2018-05-01 DIAGNOSIS — K7030 Alcoholic cirrhosis of liver without ascites: Secondary | ICD-10-CM | POA: Diagnosis present

## 2018-05-01 HISTORY — DX: Spontaneous bacterial peritonitis: K65.2

## 2018-05-01 LAB — COMPREHENSIVE METABOLIC PANEL
ALT: 97 U/L — AB (ref 0–44)
AST: 103 U/L — AB (ref 15–41)
Albumin: 2.4 g/dL — ABNORMAL LOW (ref 3.5–5.0)
Alkaline Phosphatase: 116 U/L (ref 38–126)
Anion gap: 11 (ref 5–15)
BUN: 21 mg/dL — ABNORMAL HIGH (ref 6–20)
CHLORIDE: 109 mmol/L (ref 98–111)
CO2: 22 mmol/L (ref 22–32)
CREATININE: 0.5 mg/dL — AB (ref 0.61–1.24)
Calcium: 8.1 mg/dL — ABNORMAL LOW (ref 8.9–10.3)
GFR calc Af Amer: >60 mL/min (ref >60)
GFR calc non Af Amer: >60 mL/min (ref >60)
Glucose, Bld: 162 mg/dL — ABNORMAL HIGH (ref 70–99)
POTASSIUM: 3.5 mmol/L (ref 3.5–5.1)
Sodium: 142 mmol/L (ref 135–145)
Total Bilirubin: 11.5 mg/dL — ABNORMAL HIGH (ref 0.3–1.2)
Total Protein: 5.7 g/dL — ABNORMAL LOW (ref 6.5–8.1)

## 2018-05-01 LAB — TYPE AND SCREEN
ABO/RH(D): A POS
Antibody Screen: NEGATIVE

## 2018-05-01 LAB — ACETAMINOPHEN LEVEL: Acetaminophen (Tylenol), Serum: <10 ug/mL — ABNORMAL LOW (ref 10–30)

## 2018-05-01 LAB — BLOOD CULTURE ID PANEL (REFLEXED)
Acinetobacter baumannii: NOT DETECTED
CANDIDA KRUSEI: NOT DETECTED
Candida albicans: NOT DETECTED
Candida glabrata: NOT DETECTED
Candida parapsilosis: NOT DETECTED
Candida tropicalis: NOT DETECTED
ENTEROCOCCUS SPECIES: NOT DETECTED
Enterobacter cloacae complex: NOT DETECTED
Enterobacteriaceae species: NOT DETECTED
Escherichia coli: NOT DETECTED
Haemophilus influenzae: NOT DETECTED
KLEBSIELLA OXYTOCA: NOT DETECTED
Klebsiella pneumoniae: NOT DETECTED
Listeria monocytogenes: NOT DETECTED
Neisseria meningitidis: NOT DETECTED
Proteus species: NOT DETECTED
Pseudomonas aeruginosa: NOT DETECTED
Serratia marcescens: NOT DETECTED
Staphylococcus aureus (BCID): NOT DETECTED
Staphylococcus species: NOT DETECTED
Streptococcus agalactiae: NOT DETECTED
Streptococcus pneumoniae: NOT DETECTED
Streptococcus pyogenes: NOT DETECTED
Streptococcus species: NOT DETECTED

## 2018-05-01 LAB — MRSA PCR SCREENING: MRSA by PCR: NEGATIVE

## 2018-05-01 LAB — PROTEIN, PLEURAL OR PERITONEAL FLUID: Total protein, fluid: <3 g/dL

## 2018-05-01 LAB — CBC WITH DIFFERENTIAL/PLATELET
Abs Immature Granulocytes: 1.57 10*3/uL — ABNORMAL HIGH (ref 0.00–0.07)
Basophils Absolute: 0.1 10*3/uL (ref 0.0–0.1)
Basophils Relative: 0 %
Eosinophils Absolute: 0 10*3/uL (ref 0.0–0.5)
Eosinophils Relative: 0 %
HCT: 29.2 % — ABNORMAL LOW (ref 39.0–52.0)
Hemoglobin: 10 g/dL — ABNORMAL LOW (ref 13.0–17.0)
Immature Granulocytes: 7 %
Lymphocytes Relative: 11 %
Lymphs Abs: 2.4 10*3/uL (ref 0.7–4.0)
MCH: 36.2 pg — ABNORMAL HIGH (ref 26.0–34.0)
MCHC: 34.2 g/dL (ref 30.0–36.0)
MCV: 105.8 fL — ABNORMAL HIGH (ref 80.0–100.0)
Monocytes Absolute: 3.9 10*3/uL — ABNORMAL HIGH (ref 0.1–1.0)
Monocytes Relative: 18 %
Neutro Abs: 13.5 10*3/uL — ABNORMAL HIGH (ref 1.7–7.7)
Neutrophils Relative %: 64 %
PLATELETS: 43 10*3/uL — AB (ref 150–400)
RBC: 2.76 MIL/uL — ABNORMAL LOW (ref 4.22–5.81)
RDW: 18.1 % — AB (ref 11.5–15.5)
WBC: 21.4 10*3/uL — ABNORMAL HIGH (ref 4.0–10.5)
nRBC: 0.2 % (ref 0.0–0.2)

## 2018-05-01 LAB — HIV ANTIBODY (ROUTINE TESTING W REFLEX): HIV Screen 4th Generation wRfx: NONREACTIVE

## 2018-05-01 LAB — PROTIME-INR
INR: 2.54
INR: 7.73
Prothrombin Time: 27 seconds — ABNORMAL HIGH (ref 11.4–15.2)
Prothrombin Time: 63.9 seconds — ABNORMAL HIGH (ref 11.4–15.2)

## 2018-05-01 LAB — AMMONIA: Ammonia: 190 umol/L — ABNORMAL HIGH (ref 9–35)

## 2018-05-01 LAB — LACTATE DEHYDROGENASE, PLEURAL OR PERITONEAL FLUID: LD, Fluid: 50 U/L — ABNORMAL HIGH (ref 3–23)

## 2018-05-01 LAB — GLUCOSE, PLEURAL OR PERITONEAL FLUID: Glucose, Fluid: 163 mg/dL

## 2018-05-01 LAB — LACTIC ACID, PLASMA
Lactic Acid, Venous: 1.7 mmol/L (ref 0.5–1.9)
Lactic Acid, Venous: 2.1 mmol/L (ref 0.5–1.9)

## 2018-05-01 LAB — ABO/RH: ABO/RH(D): A POS

## 2018-05-01 LAB — ALBUMIN, PLEURAL OR PERITONEAL FLUID: Albumin, Fluid: <1 g/dL

## 2018-05-01 LAB — SALICYLATE LEVEL: Salicylate Lvl: <7 mg/dL (ref 2.8–30.0)

## 2018-05-01 LAB — MAGNESIUM: Magnesium: 1.9 mg/dL (ref 1.7–2.4)

## 2018-05-01 MED ORDER — DEXTROSE 5 % IV SOLN
INTRAVENOUS | Status: AC
  Administered 2018-05-02: 18:00:00 via INTRAVENOUS
  Filled 2018-05-01 (×3): qty 1000

## 2018-05-01 MED ORDER — LACTULOSE 10 GM/15ML PO SOLN
20.0000 g | Freq: Three times a day (TID) | ORAL | Status: DC
  Administered 2018-05-01 – 2018-05-07 (×19): 20 g via ORAL
  Filled 2018-05-01 (×20): qty 30

## 2018-05-01 MED ORDER — SODIUM CHLORIDE 0.9% FLUSH
3.0000 mL | Freq: Two times a day (BID) | INTRAVENOUS | Status: DC
  Administered 2018-05-01 – 2018-05-09 (×14): 3 mL via INTRAVENOUS

## 2018-05-01 MED ORDER — SODIUM CHLORIDE 0.9 % IV SOLN
250.0000 mL | INTRAVENOUS | Status: DC | PRN
  Administered 2018-05-01 – 2018-05-07 (×3): 250 mL via INTRAVENOUS

## 2018-05-01 MED ORDER — VITAMIN K1 10 MG/ML IJ SOLN
10.0000 mg | Freq: Once | INTRAVENOUS | Status: AC
  Administered 2018-05-01: 10 mg via INTRAVENOUS
  Filled 2018-05-01: qty 1

## 2018-05-01 MED ORDER — THIAMINE HCL 100 MG/ML IJ SOLN
100.0000 mg | Freq: Every day | INTRAMUSCULAR | Status: DC
  Administered 2018-05-02: 100 mg via INTRAVENOUS
  Filled 2018-05-01: qty 2
  Filled 2018-05-01 (×2): qty 1

## 2018-05-01 MED ORDER — DEXTROSE 5 % IV SOLN
15.0000 mg/kg/h | INTRAVENOUS | Status: DC
  Administered 2018-05-01: 15 mg/kg/h via INTRAVENOUS
  Filled 2018-05-01: qty 200

## 2018-05-01 MED ORDER — LORAZEPAM 1 MG PO TABS
0.0000 mg | ORAL_TABLET | Freq: Four times a day (QID) | ORAL | Status: AC
  Administered 2018-05-02: 2 mg via ORAL
  Filled 2018-05-01: qty 1
  Filled 2018-05-01: qty 2

## 2018-05-01 MED ORDER — LORAZEPAM 1 MG PO TABS: 1.0000 mg | ORAL_TABLET | Freq: Four times a day (QID) | ORAL | Status: AC | PRN

## 2018-05-01 MED ORDER — ALBUMIN HUMAN 25 % IV SOLN
100.0000 g | Freq: Once | INTRAVENOUS | Status: DC
  Filled 2018-05-01: qty 400

## 2018-05-01 MED ORDER — FENTANYL CITRATE (PF) 100 MCG/2ML IJ SOLN
25.0000 ug | INTRAMUSCULAR | Status: DC | PRN
  Administered 2018-05-01 (×2): 50 ug via INTRAVENOUS
  Filled 2018-05-01 (×2): qty 2

## 2018-05-01 MED ORDER — SODIUM CHLORIDE 0.9 % IV SOLN
2.0000 g | INTRAVENOUS | Status: DC
  Administered 2018-05-01 – 2018-05-08 (×8): 2 g via INTRAVENOUS
  Filled 2018-05-01: qty 20
  Filled 2018-05-01 (×2): qty 2
  Filled 2018-05-01: qty 20
  Filled 2018-05-01 (×4): qty 2

## 2018-05-01 MED ORDER — OXYMETAZOLINE HCL 0.05 % NA SOLN
2.0000 | Freq: Two times a day (BID) | NASAL | Status: DC | PRN
  Filled 2018-05-01: qty 15

## 2018-05-01 MED ORDER — ADULT MULTIVITAMIN W/MINERALS CH
1.0000 | ORAL_TABLET | Freq: Every day | ORAL | Status: DC
  Administered 2018-05-03 – 2018-05-09 (×7): 1 via ORAL
  Filled 2018-05-01 (×8): qty 1

## 2018-05-01 MED ORDER — LORAZEPAM 2 MG/ML IJ SOLN
1.0000 mg | Freq: Four times a day (QID) | INTRAMUSCULAR | Status: AC | PRN
  Administered 2018-05-02: 1 mg via INTRAVENOUS
  Filled 2018-05-01: qty 1

## 2018-05-01 MED ORDER — ACETYLCYSTEINE LOAD VIA INFUSION
150.0000 mg/kg | Freq: Once | INTRAVENOUS | Status: DC
  Administered 2018-05-01: 16485 mg via INTRAVENOUS
  Filled 2018-05-01: qty 413

## 2018-05-01 MED ORDER — VITAMIN B-1 100 MG PO TABS
100.0000 mg | ORAL_TABLET | Freq: Every day | ORAL | Status: DC
  Administered 2018-05-03 – 2018-05-09 (×7): 100 mg via ORAL
  Filled 2018-05-01 (×9): qty 1

## 2018-05-01 MED ORDER — FOLIC ACID 1 MG PO TABS
1.0000 mg | ORAL_TABLET | Freq: Every day | ORAL | Status: DC
  Filled 2018-05-01: qty 1

## 2018-05-01 MED ORDER — SODIUM CHLORIDE 0.9% FLUSH: 3.0000 mL | INTRAVENOUS | Status: DC | PRN

## 2018-05-01 MED ORDER — ALBUMIN HUMAN 25 % IV SOLN
100.0000 g | Freq: Once | INTRAVENOUS | Status: AC
  Administered 2018-05-01: 100 g via INTRAVENOUS
  Filled 2018-05-01: qty 400

## 2018-05-01 MED ORDER — SPIRONOLACTONE 12.5 MG HALF TABLET
12.5000 mg | ORAL_TABLET | Freq: Every day | ORAL | Status: DC
  Filled 2018-05-01 (×2): qty 1

## 2018-05-01 MED ORDER — FUROSEMIDE 40 MG PO TABS
40.0000 mg | ORAL_TABLET | Freq: Every day | ORAL | Status: DC
  Administered 2018-05-01: 40 mg via ORAL
  Filled 2018-05-01 (×2): qty 1

## 2018-05-01 MED ORDER — PANTOPRAZOLE SODIUM 40 MG PO TBEC
40.0000 mg | DELAYED_RELEASE_TABLET | Freq: Every day | ORAL | Status: DC
  Filled 2018-05-01 (×2): qty 1

## 2018-05-01 MED ORDER — ONDANSETRON HCL 4 MG/2ML IJ SOLN: 4.0000 mg | Freq: Four times a day (QID) | INTRAMUSCULAR | Status: DC | PRN

## 2018-05-01 MED ORDER — ONDANSETRON HCL 4 MG PO TABS: 4.0000 mg | ORAL_TABLET | Freq: Four times a day (QID) | ORAL | Status: DC | PRN

## 2018-05-01 MED ORDER — LORAZEPAM 1 MG PO TABS: 0.0000 mg | ORAL_TABLET | Freq: Two times a day (BID) | ORAL | Status: AC

## 2018-05-01 MED ORDER — MORPHINE SULFATE (PF) 4 MG/ML IV SOLN
4.0000 mg | Freq: Once | INTRAVENOUS | Status: AC
  Administered 2018-05-01: 4 mg via INTRAVENOUS
  Filled 2018-05-01: qty 1

## 2018-05-01 MED ORDER — SODIUM CHLORIDE 0.9 % IV SOLN
2.0000 g | Freq: Three times a day (TID) | INTRAVENOUS | Status: DC
  Administered 2018-05-01: 2 g via INTRAVENOUS
  Filled 2018-05-01 (×2): qty 2

## 2018-05-01 MED ORDER — POTASSIUM CHLORIDE CRYS ER 20 MEQ PO TBCR
20.0000 meq | EXTENDED_RELEASE_TABLET | Freq: Once | ORAL | Status: AC
  Administered 2018-05-01: 20 meq via ORAL
  Filled 2018-05-01: qty 1

## 2018-05-01 MED ORDER — DEXTROSE 5 % IV SOLN
INTRAVENOUS | Status: AC
  Filled 2018-05-01: qty 1000

## 2018-05-01 NOTE — Progress Notes (Signed)
PROGRESS NOTE    Jason [name]  FAO:[id number] DOB: [birth year] DOA: 04/30/2018 PCP: Patient, No Pcp Per    Brief Narrative:  38 y.o. male with medical history significant for alcohol abuse and liver disease, now presenting to emergency department for evaluation of severe abdominal pain with increased abdominal distention, nausea, and nonbloody vomiting.  Patient reports a history of alcohol abuse, states that he saw a physician several months ago and was diagnosed with liver disease and prescribed several medications that he does not recall the names of.  He had intended to stop drinking at that time, but unfortunately has continued with last drink this morning.  Over the past 1 to 2 days, he has developed severe generalized abdominal pain with increased abdominal distention, nausea and nonbloody vomiting.  He denies any melena, hematochezia, or hematemesis.  Has not noted any fevers, but has had some chills.  Denies chest pain, cough, shortness of breath, or dysuria.  ED Course: Upon arrival to the ED, patient is found to be afebrile, saturating well on room air, slightly tachycardic, and with stable blood pressure.  Chemistry panel is notable for potassium of 3.1, albumin 2.1, mild elevation in transaminases, and total bilirubin of 9.8.  CBC features a leukocytosis to 24,000, microcytic anemia with hemoglobin 10.8, and platelets 52,000.  Lactic acid was elevated to 2.64.  INR is elevated to 7.73.  Blood cultures were collected, 2 L normal saline administered, morphine given, and paracentesis was performed with removal of approximately 2 L of turbid fluid.  Sample of peritoneal fluid was sent to the lab for cultures and additional analysis, and the patient was treated with cefepime.  Lactic acid normalized, blood pressure remained stable, and the patient will be observed for further evaluation and management of decompensated liver cirrhosis with concern for SBP.  Assessment & Plan:   Principal  Problem:   Decompensated hepatic cirrhosis (HCC) Active Problems:   SBP (spontaneous bacterial peritonitis) (HCC)   Macrocytic anemia   Thrombocytopenia (HCC)   Hypokalemia   Alcohol abuse   Liver lesion, right lobe  1. 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 Given elevated WBC and encephalopathy, will continue empiric tx for sbp -Follow up pan cultures -Consulted GI. Initial recommendation to transfer to [facility], however [facility] had decined accepting patient -For now, continue acetylcystine, vitamin K -Poor prognosis. Updated patient's brother at bedside  2. Macrocytic anemia; thrombocytopenia  - Presenting Hgb is 10.8 with MCV 104.6 and platelets 52,000  - He denies any melena, hematochezia, or hematemesis  - Likely secondary to alcohol abuse and liver disease  - Repeat CBC in AM   3. Hypokalemia  - Potassium replaced -repeat bmet in AM  4. Alcohol abuse  - Reports he had intended to quit after learning of his liver problems several months ago, but has continued with last drink the am of 04/30/18  - No evidence of withdrawals a this time - Continue on CIWA  5. Liver lesions  - Subcentimeter right lobe liver lesions noted on CT  - Follow-up with MRI recommended when more stable  6. Metabolic encephalopathy - likely secondary to hepatic encephalopathy given ammonia just under 200 vs active infection from possible SBP - Continued patient on scheduled lactulose - Continue empiric abx per above - Continue CIWA per above   DVT  prophylaxis: SCD's Code Status: Full Family Communication: pt in room, brother at bedside Disposition Plan: Uncertain at this time  Consultants:   GI  Procedures:     Antimicrobials: Anti-infectives (From admission, onward)   Start     Dose/Rate  Route Frequency Ordered Stop   05/01/18 1600  cefTRIAXone (ROCEPHIN) 2 g in sodium chloride 0.9 % 100 mL IVPB     2 g 200 mL/hr over 30 Minutes Intravenous Every 24 hours 05/01/18 0930     05/01/18 0600  ceFEPIme (MAXIPIME) 2 g in sodium chloride 0.9 % 100 mL IVPB  Status:  Discontinued     2 g 200 mL/hr over 30 Minutes Intravenous Every 8 hours 05/01/18 0542 05/01/18 0930   04/30/18 2315  ceFEPIme (MAXIPIME) 2 g in sodium chloride 0.9 % 100 mL IVPB     2 g 200 mL/hr over 30 Minutes Intravenous  Once 04/30/18 2310 05/01/18 0042       Subjective: Confused  Objective: Vitals:   05/01/18 0148 05/01/18 0241 05/01/18 0617 05/01/18 0633  BP: (!) 142/92 (!) 148/82 (!) 161/92 (!) 150/73  Pulse: (!) 109 (!) 103 (!) 110 (!) 107  Resp:  17 12 16   Temp:  98 F (36.7 C) 98.2 F (36.8 C)   TempSrc:  Oral Oral   SpO2:  97% 99%   Weight:  109.9 kg    Height:  5\' 5"  (1.651 m)      Intake/Output Summary (Last 24 hours) at 05/01/2018 1104 Last data filed at 05/01/2018 1044 Gross per 24 hour  Intake 1230 ml  Output 1400 ml  Net -170 ml   Filed Weights   05/01/18 0241  Weight: 109.9 kg    Examination:  General exam: Appears aggitated yet comfortable  Respiratory system: Clear to auscultation. Respiratory effort normal. Cardiovascular system: S1 & S2 heard, RRR Gastrointestinal system: Abdomen distended, pos BS Central nervous system: Alert and oriented. No focal neurological deficits. Extremities: Symmetric 5 x 5 power. Skin: No rashes, lesions   Psychiatry: confused, affect appears normal  Data Reviewed: I have personally reviewed following labs and imaging studies  CBC: Recent Labs  Lab 04/30/18 2042 05/01/18 0445  WBC 24.0* 21.4*  NEUTROABS  --  13.5*  HGB 10.8* 10.0*  HCT 31.6* 29.2*  MCV 104.6* 105.8*  PLT 52* 43*   Basic Metabolic Panel: Recent Labs  Lab 04/30/18 2042 05/01/18 0445  NA 139 142  K 3.1* 3.5  CL 108 109  CO2 24 22  GLUCOSE 160* 162*  BUN 19  21*  CREATININE 0.63 0.50*  CALCIUM 7.9* 8.1*  MG  --  1.9   GFR: Estimated Creatinine Clearance: 147.5 mL/min (A) (by C-G formula based on SCr of 0.5 mg/dL (L)). Liver Function Tests: Recent Labs  Lab 04/30/18 2042 05/01/18 0445  AST 110* 103*  ALT 104* 97*  ALKPHOS 160* 116  BILITOT 9.8* 11.5*  PROT 5.6* 5.7*  ALBUMIN 2.1* 2.4*   Recent Labs  Lab 04/30/18 2042  LIPASE 71*   Recent Labs  Lab 05/01/18 0445  AMMONIA 190*   Coagulation Profile: Recent Labs  Lab 04/30/18 2330  INR 7.73*   Cardiac Enzymes: No results for input(s): CKTOTAL, CKMB, CKMBINDEX, TROPONINI in the last 168 hours. BNP (last 3 results) No results for input(s): PROBNP in the last 8760 hours. HbA1C: No results for input(s): HGBA1C in the last 72 hours. CBG: No results for input(s): GLUCAP in the last 168 hours. Lipid Profile: No results for input(s): CHOL,  HDL, LDLCALC, TRIG, CHOLHDL, LDLDIRECT in the last 72 hours. Thyroid Function Tests: No results for input(s): TSH, T4TOTAL, FREET4, T3FREE, THYROIDAB in the last 72 hours. Anemia Panel: No results for input(s): VITAMINB12, FOLATE, FERRITIN, TIBC, IRON, RETICCTPCT in the last 72 hours. Sepsis Labs: Recent Labs  Lab 04/30/18 2056 04/30/18 2330 05/01/18 0445  LATICACIDVEN 2.64* 1.7 2.1*    Recent Results (from the past 240 hour(s))  Body fluid culture     Status: None (Preliminary result)   Collection Time: 05/01/18 12:10 AM  Result Value Ref Range Status   Specimen Description   Final    PERITONEAL CAVITY Performed at [facility], [address] [place], [place] [address]    Special Requests   Final    NONE Performed at [facility], [address] [place], [place] [address]    Gram Stain   Final    FEW WBC PRESENT,BOTH PMN AND MONONUCLEAR NO ORGANISMS SEEN Performed at [facility], [address] [place], [place] [address]    Culture PENDING  Incomplete   Report Status  PENDING  Incomplete     Radiology Studies: Ct Abdomen Pelvis W Contrast  Result Date: 04/30/2018 CLINICAL DATA:  Cirrhosis. Nausea, vomiting and abdominal pain and distention. EXAM: CT ABDOMEN AND PELVIS WITH CONTRAST TECHNIQUE: Multidetector CT imaging of the abdomen and pelvis was performed using the standard protocol following bolus administration of intravenous contrast. CONTRAST:  100mL ISOVUE-300 IOPAMIDOL (ISOVUE-300) INJECTION 61% COMPARISON:  None. FINDINGS: Lower chest: No significant pulmonary nodules or acute consolidative airspace disease. Small lower esophageal varices. Hepatobiliary: Liver surface is diffusely irregular compatible with cirrhosis. Two scattered subcentimeter hypodense right liver lobe lesions, too small to characterize. No additional liver lesions. Mild diffuse gallbladder wall thickening. No gallbladder distention. No radiopaque cholelithiasis. No biliary ductal dilatation. Pancreas: Normal, with no mass or duct dilation. Spleen: Mild splenomegaly (craniocaudal splenic length 16.1 cm). No splenic mass. Adrenals/Urinary Tract: Normal adrenals. Nonobstructing stones in the right greater than left kidneys, largest 4 mm in the lower right kidney. No hydronephrosis. No renal masses. Normal bladder. Stomach/Bowel: Stomach mildly distended by fluid and otherwise normal. Normal caliber small bowel. Mild wall thickening throughout the proximal to mid small bowel. Normal appendix. Normal large bowel with no diverticulosis, large bowel wall thickening or pericolonic fat stranding. Vascular/Lymphatic: Normal caliber abdominal aorta. Patent portal, splenic, hepatic and renal veins. Large paraumbilical varices. Large proximal gastric varices. No pathologically enlarged lymph nodes in the abdomen or pelvis. Reproductive: Normal size prostate Other: No pneumoperitoneum. Moderate volume simple ascites. No focal fluid collection. Anasarca. Musculoskeletal: No aggressive appearing focal osseous  lesions. Sequela of congenital right hip dysplasia. Mild thoracic spondylosis. IMPRESSION: 1. Cirrhosis. Two subcentimeter hypodense right liver lobe lesions, too small to characterize. Recommend follow-up MRI abdomen without and with IV contrast in 3-6 months. This recommendation follows ACR consensus guidelines: Managing Incidental Findings on Abdominal CT: White Paper of the ACR Incidental Findings Committee. J Am Coll Radiol 2010;7:754-773. 2. Stigmata of portal hypertension including mild splenomegaly, moderate volume ascites and large paraumbilical and proximal gastric and small lower esophageal varices. 3. Mild wall thickening throughout the proximal to mid small bowel. Mild wall thickening in gallbladder. Anasarca. These findings are nonspecific and probably due to noninflammatory edema (secondary to hypoalbuminemia). 4. Nonobstructing bilateral renal stones. No hydronephrosis. Electronically Signed   By: Delbert PhenixJason A Poff M.D.   On: 04/30/2018 23:01   [place] Abdomen Limited  Result Date: 05/01/2018 CLINICAL DATA:  Acute  onset of generalized abdominal pain. Hepatic cirrhosis and hyperbilirubinemia. EXAM: ULTRASOUND ABDOMEN LIMITED RIGHT UPPER QUADRANT COMPARISON:  CT of the abdomen and pelvis from 04/30/2018 FINDINGS: Gallbladder: Diffuse gallbladder wall thickening is noted, measuring 6 mm. Pericholecystic fluid is nonspecific in the presence of ascites. Evaluation for ultrasonographic Murphy's sign is not possible as the patient was unresponsive during the study. No definite stones are seen. Common bile duct: Diameter: 0.6 cm, within normal limits in caliber. Liver: No focal lesion identified. The nodular contour of the liver is compatible with hepatic cirrhosis. Increased parenchymal echogenicity is noted. Portal vein is patent on color Doppler imaging with normal direction of blood flow towards the liver. Small to moderate volume ascites is noted at the right upper quadrant. IMPRESSION: 1. Diffuse gallbladder  wall thickening. Pericholecystic fluid is nonspecific in the presence of ascites. No definite stones seen. Cholecystitis cannot be excluded, but is not well characterized given ascites. No evidence of obstruction. 2. Changes of hepatic cirrhosis. 3. Small to moderate volume residual ascites at the right upper quadrant, status post recent paracentesis. Electronically Signed   By: Roanna Raider M.D.   On: 05/01/2018 03:06    Scheduled Meds: . folic acid  1 mg Oral Daily  . furosemide  40 mg Oral Daily  . lactulose  20 g Oral TID  . LORazepam  0-4 mg Oral Q6H   Followed by  . [START ON 05/03/2018] LORazepam  0-4 mg Oral Q12H  . multivitamin with minerals  1 tablet Oral Daily  . pantoprazole  40 mg Oral Daily  . sodium chloride flush  3 mL Intravenous Q12H  . spironolactone  12.5 mg Oral Daily  . thiamine  100 mg Oral Daily   Or  . thiamine  100 mg Intravenous Daily   Continuous Infusions: . sodium chloride 250 mL (05/01/18 0629)  . cefTRIAXone (ROCEPHIN)  IV       LOS: 0 days   Rickey Barbara, MD [facility] Pager On Amion  If 7PM-7AM, please contact night-coverage 05/01/2018, 11:04 AM

## 2018-05-01 NOTE — Progress Notes (Signed)
MEDICATION RELATED CONSULT NOTE   Pharmacy Consult for Acetylcysteine infusion Indication: Liver failure  No Known Allergies  Patient Measurements: Height: 5\' 5"  (165.1 cm) Weight: 237 lb 14 oz (107.9 kg) IBW/kg (Calculated) : 61.5  Vital Signs: Temp: 98.2 F (36.8 C) (01/02 1200) Temp Source: Oral (01/02 1200) BP: 150/73 (01/02 40980633) Pulse Rate: 107 (01/02 0633) Intake/Output from previous day: 01/01 0701 - 01/02 0700 In: 1230 [P.O.:30; IV Piggyback:1200] Out: 300 [Urine:300] Intake/Output from this shift: Total I/O In: -  Out: 1100 [Urine:1100]  Labs: Recent Labs    04/30/18 2042 05/01/18 0445  WBC 24.0* 21.4*  HGB 10.8* 10.0*  HCT 31.6* 29.2*  PLT 52* 43*  CREATININE 0.63 0.50*  MG  --  1.9  ALBUMIN 2.1* 2.4*  PROT 5.6* 5.7*  AST 110* 103*  ALT 104* 97*  ALKPHOS 160* 116  BILITOT 9.8* 11.5*   Estimated Creatinine Clearance: 146 mL/min (A) (by C-G formula based on SCr of 0.5 mg/dL (L)).  Medical History: History reviewed. No pertinent past medical history.  Medications:  Scheduled:  . folic acid  1 mg Oral Daily  . furosemide  40 mg Oral Daily  . lactulose  20 g Oral TID  . LORazepam  0-4 mg Oral Q6H   Followed by  . [START ON 05/03/2018] LORazepam  0-4 mg Oral Q12H  . multivitamin with minerals  1 tablet Oral Daily  . pantoprazole  40 mg Oral Daily  . sodium chloride flush  3 mL Intravenous Q12H  . spironolactone  12.5 mg Oral Daily  . thiamine  100 mg Oral Daily   Or  . thiamine  100 mg Intravenous Daily   Infusions:  . sodium chloride 250 mL (05/01/18 0629)  . acetylcysteine    . cefTRIAXone (ROCEPHIN)  IV    . dextrose 5 % 1,000 mL with acetylcysteine (ACETADOTE) 40,000 mg infusion     Followed by  . dextrose 5 % 1,000 mL with acetylcysteine (ACETADOTE) 40,000 mg infusion    . phytonadione (VITAMIN K) IV Stopped (05/01/18 1444)   Assessment: 38 y.o. male with medical history significant for alcohol abuse and liver disease. Admit with  severe abdominal pain with increased abdominal distention, nausea, and nonbloody vomiting.   Follow LFTs, INR  VitK 10mg  IVPB x1   Plan: Acetylcysteine infusion for liver failure    Rate 150mg /kg/hr x 1hr > 12.5mg /kg/hr x 4hr > 6.25mg /kg/hr x 67 more hours   Otho BellowsGreen, Hutton Pellicane L PharmD Pager [phone] 05/01/2018, 2:24 PM

## 2018-05-01 NOTE — ED Notes (Signed)
Date and time results received: 05/01/18 0040  Test: INR Critical Value: 7.73 Name of Provider Notified: Dr. Antionette Char Orders Received? Or Actions Taken?: see chart

## 2018-05-01 NOTE — ED Notes (Signed)
Ladona Ridgel primary RN made aware INR is 7.73

## 2018-05-01 NOTE — H&P (Addendum)
History and Physical    Latasha Puskas QPR:[id number] DOB: 05/02/1980 DOA: 04/30/2018  PCP: Patient, No Pcp Per   Patient coming from: Home   Chief Complaint: Severe abdominal pain, N/V   HPI: Helen Cuff is a 38 y.o. male with medical history significant for alcohol abuse and liver disease, now presenting to emergency department for evaluation of severe abdominal pain with increased abdominal distention, nausea, and nonbloody vomiting.  Patient reports a history of alcohol abuse, states that he saw a physician several months ago and was diagnosed with liver disease and prescribed several medications that he does not recall the names of.  He had intended to stop drinking at that time, but unfortunately has continued with last drink this morning.  Over the past 1 to 2 days, he has developed severe generalized abdominal pain with increased abdominal distention, nausea and nonbloody vomiting.  He denies any melena, hematochezia, or hematemesis.  Has not noted any fevers, but has had some chills.  Denies chest pain, cough, shortness of breath, or dysuria.  ED Course: Upon arrival to the ED, patient is found to be afebrile, saturating well on room air, slightly tachycardic, and with stable blood pressure.  Chemistry panel is notable for potassium of 3.1, albumin 2.1, mild elevation in transaminases, and total bilirubin of 9.8.  CBC features a leukocytosis to 24,000, microcytic anemia with hemoglobin 10.8, and platelets 52,000.  Lactic acid was elevated to 2.64.  INR is elevated to 7.73.  Blood cultures were collected, 2 L normal saline administered, morphine given, and paracentesis was performed with removal of approximately 2 L of turbid fluid.  Sample of peritoneal fluid was sent to the lab for cultures and additional analysis, and the patient was treated with cefepime.  Lactic acid normalized, blood pressure remained stable, and the patient will be observed for further evaluation and management of  decompensated liver cirrhosis with concern for SBP.  Review of Systems:  All other systems reviewed and apart from HPI, are negative.  History reviewed. No pertinent past medical history.  History reviewed. No pertinent surgical history.   reports that he has never smoked. He has never used smokeless tobacco. He reports current alcohol use. He reports that he does not use drugs.  Not on File  History reviewed. No pertinent family history.   Prior to Admission medications   Not on File    Physical Exam: Vitals:   04/30/18 1948 04/30/18 2006 04/30/18 2305  BP:  131/68 (!) 157/68  Pulse:  (!) 106 (!) 104  Resp:  16 16  Temp:  98.6 F (37 C)   TempSrc:  Oral   SpO2: 97% 100% 98%    Constitutional: NAD, calm, appears uncomfortable Eyes: PERTLA, lids and conjunctivae normal ENMT: Mucous membranes are moist. Posterior pharynx clear of any exudate or lesions.   Neck: normal, supple, no masses, no thyromegaly Respiratory: clear to auscultation bilaterally, no wheezing, no crackles. Normal respiratory effort.    Cardiovascular: S1 & S2 heard, regular rate and rhythm. 2+ pitting edema to bilateral LE's. Abdomen: Distended, soft, generally tender without rebound pain or guarding. Bowel sounds active.  Musculoskeletal: no clubbing / cyanosis. No joint deformity upper and lower extremities.    Skin: no significant rashes, lesions, ulcers. Warm, dry, well-perfused. Neurologic: no facial asymmetry. Sensation intact. Moving all extremities.  Psychiatric: Alert and oriented to person, place, and situation. Calm, cooperative.     Labs on Admission: I have personally reviewed following labs and imaging studies  CBC: Recent  Labs  Lab 04/30/18 2042  WBC 24.0*  HGB 10.8*  HCT 31.6*  MCV 104.6*  PLT 52*   Basic Metabolic Panel: Recent Labs  Lab 04/30/18 2042  NA 139  K 3.1*  CL 108  CO2 24  GLUCOSE 160*  BUN 19  CREATININE 0.63  CALCIUM 7.9*   GFR: CrCl cannot be  calculated (Unknown ideal weight.). Liver Function Tests: Recent Labs  Lab 04/30/18 2042  AST 110*  ALT 104*  ALKPHOS 160*  BILITOT 9.8*  PROT 5.6*  ALBUMIN 2.1*   Recent Labs  Lab 04/30/18 2042  LIPASE 71*   No results for input(s): AMMONIA in the last 168 hours. Coagulation Profile: No results for input(s): INR, PROTIME in the last 168 hours. Cardiac Enzymes: No results for input(s): CKTOTAL, CKMB, CKMBINDEX, TROPONINI in the last 168 hours. BNP (last 3 results) No results for input(s): PROBNP in the last 8760 hours. HbA1C: No results for input(s): HGBA1C in the last 72 hours. CBG: No results for input(s): GLUCAP in the last 168 hours. Lipid Profile: No results for input(s): CHOL, HDL, LDLCALC, TRIG, CHOLHDL, LDLDIRECT in the last 72 hours. Thyroid Function Tests: No results for input(s): TSH, T4TOTAL, FREET4, T3FREE, THYROIDAB in the last 72 hours. Anemia Panel: No results for input(s): VITAMINB12, FOLATE, FERRITIN, TIBC, IRON, RETICCTPCT in the last 72 hours. Urine analysis:    Component Value Date/Time   COLORURINE YELLOW 04/30/2018 2042   APPEARANCEUR CLEAR 04/30/2018 2042   LABSPEC 1.012 04/30/2018 2042   PHURINE 8.0 04/30/2018 2042   GLUCOSEU NEGATIVE 04/30/2018 2042   HGBUR SMALL (A) 04/30/2018 2042   BILIRUBINUR NEGATIVE 04/30/2018 2042   KETONESUR NEGATIVE 04/30/2018 2042   PROTEINUR NEGATIVE 04/30/2018 2042   NITRITE NEGATIVE 04/30/2018 2042   LEUKOCYTESUR NEGATIVE 04/30/2018 2042   Sepsis Labs: '@LABRCNTIP'$ (procalcitonin:4,lacticidven:4) )No results found for this or any previous visit (from the past 240 hour(s)).   Radiological Exams on Admission: Ct Abdomen Pelvis W Contrast  Result Date: 04/30/2018 CLINICAL DATA:  Cirrhosis. Nausea, vomiting and abdominal pain and distention. EXAM: CT ABDOMEN AND PELVIS WITH CONTRAST TECHNIQUE: Multidetector CT imaging of the abdomen and pelvis was performed using the standard protocol following bolus  administration of intravenous contrast. CONTRAST:  177m ISOVUE-300 IOPAMIDOL (ISOVUE-300) INJECTION 61% COMPARISON:  None. FINDINGS: Lower chest: No significant pulmonary nodules or acute consolidative airspace disease. Small lower esophageal varices. Hepatobiliary: Liver surface is diffusely irregular compatible with cirrhosis. Two scattered subcentimeter hypodense right liver lobe lesions, too small to characterize. No additional liver lesions. Mild diffuse gallbladder wall thickening. No gallbladder distention. No radiopaque cholelithiasis. No biliary ductal dilatation. Pancreas: Normal, with no mass or duct dilation. Spleen: Mild splenomegaly (craniocaudal splenic length 16.1 cm). No splenic mass. Adrenals/Urinary Tract: Normal adrenals. Nonobstructing stones in the right greater than left kidneys, largest 4 mm in the lower right kidney. No hydronephrosis. No renal masses. Normal bladder. Stomach/Bowel: Stomach mildly distended by fluid and otherwise normal. Normal caliber small bowel. Mild wall thickening throughout the proximal to mid small bowel. Normal appendix. Normal large bowel with no diverticulosis, large bowel wall thickening or pericolonic fat stranding. Vascular/Lymphatic: Normal caliber abdominal aorta. Patent portal, splenic, hepatic and renal veins. Large paraumbilical varices. Large proximal gastric varices. No pathologically enlarged lymph nodes in the abdomen or pelvis. Reproductive: Normal size prostate Other: No pneumoperitoneum. Moderate volume simple ascites. No focal fluid collection. Anasarca. Musculoskeletal: No aggressive appearing focal osseous lesions. Sequela of congenital right hip dysplasia. Mild thoracic spondylosis. IMPRESSION: 1. Cirrhosis. Two subcentimeter hypodense  right liver lobe lesions, too small to characterize. Recommend follow-up MRI abdomen without and with IV contrast in 3-6 months. This recommendation follows ACR consensus guidelines: Managing Incidental Findings  on Abdominal CT: White Paper of the ACR Incidental Findings Committee. J Am Coll Radiol 2010;7:754-773. 2. Stigmata of portal hypertension including mild splenomegaly, moderate volume ascites and large paraumbilical and proximal gastric and small lower esophageal varices. 3. Mild wall thickening throughout the proximal to mid small bowel. Mild wall thickening in gallbladder. Anasarca. These findings are nonspecific and probably due to noninflammatory edema (secondary to hypoalbuminemia). 4. Nonobstructing bilateral renal stones. No hydronephrosis. Electronically Signed   By: Ilona Sorrel M.D.   On: 04/30/2018 23:01    EKG: Not performed.   Assessment/Plan   1. Decompensated cirrhosis; ?SBP  - Patient reports hx of alcohol abuse, was diagnosed with liver disease several months ago and started on several medications (unlcear which), has intended to stop drinking but continues, and now presents with severe generalized abdominal pain and nausea with non-bloody vomiting  - He has mild elevations in transaminases and alk phos, t bili 9.8, INR 7.73, and normal creatinine and sodium levels -> MELD is 38 on admission; no prior labs available  - There is marked leukocytosis without fever  - There is lg-vol ascites with 2 liters removed in ED, appears turbid, and sample sent for analysis  - He was started on cefepime for suspected SBP in ED - Continue cefepime and give 1.5 g/kg albumin for suspected SBP and re-dose albumin on day 3 if fluid analysis is consistent with SBP  - Continue supportive care with as-needed analgesia, monitor for bleeding, start PPI, obtain medical records, check viral hepatitis panel, consider beta-blocker, PPI, and diuretics at discharge; if SBP confirmed, should also consider ppx abx going forward   2. Macrocytic anemia; thrombocytopenia  - Hgb is 10.8 with MCV 104.6 and platelets 52,000  - He denies any melena, hematochezia, or hematemesis  - Likely secondary to alcohol abuse and  liver disease  - Type and screen, monitor counts    3. Hypokalemia  - Serum potassium is 3.1, likely secondary to vomiting  - Treated with oral potassium  - Repeat chem panel in am    4. Alcohol abuse  - Reports he had intended to quit after learning of his liver problems several months ago, but has continued with last drink the am of 04/30/18  - Does not appear to be withdrawing in ED, will monitor with CIWA, use Ativan as-needed, and supplement b-vitamins    5. Liver lesions  - Subcentimeter right lobe liver lesions noted on CT  - Follow-up with MRI recommended    DVT prophylaxis: SCD's  Code Status: Full  Family Communication: Discussed with patient  Consults called: None Admission status: Observation     Vianne Bulls, MD [facility] Pager [phone]  If 7PM-7AM, please contact night-coverage [email or web address] Password [facility]  05/01/2018, 12:27 AM

## 2018-05-01 NOTE — ED Notes (Signed)
Patient ambulated to restroom with minimal assistance. ?

## 2018-05-01 NOTE — Consult Note (Addendum)
Consultation  Referring Provider: Dr. Rhona Leavens     Primary Care Physician:  Patient, No Pcp Per Primary Gastroenterologist: Gentry Fitz        Reason for Consultation: Liver failure             HPI:   Jason [name] is a 38 y.o. Hispanic male, who is primarily Spanish-speaking, who presented to the hospital 04/30/2018 with severe abdominal pain nausea and vomiting.    Today, patient is encephalopathic and unable to answer my questioning other than one-word answers.  Did also try to use the interpreter, but patient is still unable to answer questioning.  History is garnered from previous physician's notes.    Patient does have a history of significant alcohol abuse and liver disease, who presented to the ER yesterday with severe abdominal pain increased abdominal distention, nausea and nonbloody vomiting, reported history of alcohol abuse.  Describes seeing a physician several months ago and was diagnosed with liver disease and prescribed several medications but she does not recall the names of them.  He had intended to stop drinking that time but unfortunately had continued with his last drink yesterday morning.  Over the past 1 to 2 days he developed severe generalized abdominal pain with increased abdominal distention, nausea and nonbloody vomiting, also describes some chills with no fever.    At time of admission denied melena, hematochezia, hematemesis, chest pain, cough, shortness of breath or dysuria.  ED course: Slightly tachycardic, can panel notable for potassium 3.1, albumin 2.1, mild elevation in transaminases and total bilirubin of 9.8, CBC with leukocytosis of 24,000, microcytic anemia with hemoglobin 10.8 and platelets 52,000, lactic acid elevated 2.64, INR elevated at 7.73.  Blood cultures were collected, 2 L normal saline administered, morphine given, paracentesis was performed with removal of approximately 2 L of turbid fluid, sample sent for cultures and additional analysis and patient  was treated with cefepime.  Lactic acid normalized, blood pressure remained stable and the patient was admitted for observation for decompensated liver cirrhosis with concern for SBP.  Social History   Tobacco Use  . Smoking status: Never Smoker  . Smokeless tobacco: Never Used  Substance Use Topics  . Alcohol use: Yes  . Drug use: Never    Prior to Admission medications   Not on File    Current Facility-Administered Medications  Medication Dose Route Frequency Provider Last Rate Last Dose  . 0.9 %  sodium chloride infusion  250 mL Intravenous PRN Jerald Kief, MD 10 mL/hr at 05/01/18 0629 250 mL at 05/01/18 0629  . cefTRIAXone (ROCEPHIN) 2 g in sodium chloride 0.9 % 100 mL IVPB  2 g Intravenous Q24H Jerald Kief, MD      . fentaNYL (SUBLIMAZE) injection 25-50 mcg  25-50 mcg Intravenous Q2H PRN Jerald Kief, MD      . folic acid (FOLVITE) tablet 1 mg  1 mg Oral Daily Jerald Kief, MD      . furosemide (LASIX) tablet 40 mg  40 mg Oral Daily Jerald Kief, MD      . lactulose (CHRONULAC) 10 GM/15ML solution 20 g  20 g Oral TID Jerald Kief, MD      . LORazepam (ATIVAN) tablet 1 mg  1 mg Oral Q6H PRN Jerald Kief, MD       Or  . LORazepam (ATIVAN) injection 1 mg  1 mg Intravenous Q6H PRN Jerald Kief, MD      . LORazepam (ATIVAN)  tablet 0-4 mg  0-4 mg Oral Q6H Jerald Kief, MD       Followed by  . [START ON 05/03/2018] LORazepam (ATIVAN) tablet 0-4 mg  0-4 mg Oral Q12H Jerald Kief, MD      . multivitamin with minerals tablet 1 tablet  1 tablet Oral Daily Jerald Kief, MD      . ondansetron [facility]) tablet 4 mg  4 mg Oral Q6H PRN Jerald Kief, MD       Or  . ondansetron [facility]) injection 4 mg  4 mg Intravenous Q6H PRN Jerald Kief, MD      . pantoprazole (PROTONIX) EC tablet 40 mg  40 mg Oral Daily Jerald Kief, MD      . sodium chloride flush (NS) 0.9 % injection 3 mL  3 mL Intravenous Q12H Jerald Kief, MD      . sodium chloride flush (NS)  0.9 % injection 3 mL  3 mL Intravenous PRN Jerald Kief, MD      . spironolactone (ALDACTONE) tablet 12.5 mg  12.5 mg Oral Daily Jerald Kief, MD      . thiamine (VITAMIN B-1) tablet 100 mg  100 mg Oral Daily Jerald Kief, MD   Stopped at 05/01/18 1144   Or  . thiamine (B-1) injection 100 mg  100 mg Intravenous Daily Jerald Kief, MD        Allergies as of 04/30/2018  . (Not on File)     Review of Systems:    See HPI unable to obtain at time of my exam due to AMS   Physical Exam:  Vital signs in last 24 hours: Temp:  [98 F (36.7 C)-98.6 F (37 C)] 98.2 F (36.8 C) (01/02 0617) Pulse Rate:  [99-110] 107 (01/02 0633) Resp:  [12-18] 16 (01/02 0633) BP: (130-161)/(68-94) 150/73 (01/02 0633) SpO2:  [97 %-100 %] 99 % (01/02 0617) Weight:  [109.9 kg] 109.9 kg (01/02 0241) Last BM Date: (unknown) General:   Hispanic male appears to be in NAD, Well developed, Well nourished, alert and cooperative Head:  Normocephalic and atraumatic. Eyes:   PEERL, EOMI. +icteric Ears:  Normal auditory acuity. Neck:  Supple Throat: Oral cavity and pharynx without inflammation, swelling or lesion.  Lungs: Respirations even and unlabored. Lungs clear to auscultation bilaterally.   No wheezes, crackles, or rhonchi.  Heart: Normal S1, S2. No MRG. Regular rate and rhythm. No peripheral edema, cyanosis or pallor.  Abdomen:  Soft, Moderate distension, mild generalized ttp, No rebound or guarding. Normal bowel sounds. No appreciable masses or hepatomegaly. Rectal:  Not performed.  Msk:  Symmetrical without gross deformities. Peripheral pulses intact.  Extremities:  +b/l pitting LE edema to knee Neurologic:  AMS; unable  Skin:   Dry and intact without significant lesions or rashes. Psychiatric: Altered   LAB RESULTS: Recent Labs    04/30/18 2042 05/01/18 0445  WBC 24.0* 21.4*  HGB 10.8* 10.0*  HCT 31.6* 29.2*  PLT 52* 43*   BMET Recent Labs    04/30/18 2042 05/01/18 0445  NA 139  142  K 3.1* 3.5  CL 108 109  CO2 24 22  GLUCOSE 160* 162*  BUN 19 21*  CREATININE 0.63 0.50*  CALCIUM 7.9* 8.1*   LFT Recent Labs    05/01/18 0445  PROT 5.7*  ALBUMIN 2.4*  AST 103*  ALT 97*  ALKPHOS 116  BILITOT 11.5*   PT/INR Recent Labs    04/30/18 2330  LABPROT 63.9*  INR 7.73*    STUDIES: Ct Abdomen Pelvis W Contrast  Result Date: 04/30/2018 CLINICAL DATA:  Cirrhosis. Nausea, vomiting and abdominal pain and distention. EXAM: CT ABDOMEN AND PELVIS WITH CONTRAST TECHNIQUE: Multidetector CT imaging of the abdomen and pelvis was performed using the standard protocol following bolus administration of intravenous contrast. CONTRAST:  100mL ISOVUE-300 IOPAMIDOL (ISOVUE-300) INJECTION 61% COMPARISON:  None. FINDINGS: Lower chest: No significant pulmonary nodules or acute consolidative airspace disease. Small lower esophageal varices. Hepatobiliary: Liver surface is diffusely irregular compatible with cirrhosis. Two scattered subcentimeter hypodense right liver lobe lesions, too small to characterize. No additional liver lesions. Mild diffuse gallbladder wall thickening. No gallbladder distention. No radiopaque cholelithiasis. No biliary ductal dilatation. Pancreas: Normal, with no mass or duct dilation. Spleen: Mild splenomegaly (craniocaudal splenic length 16.1 cm). No splenic mass. Adrenals/Urinary Tract: Normal adrenals. Nonobstructing stones in the right greater than left kidneys, largest 4 mm in the lower right kidney. No hydronephrosis. No renal masses. Normal bladder. Stomach/Bowel: Stomach mildly distended by fluid and otherwise normal. Normal caliber small bowel. Mild wall thickening throughout the proximal to mid small bowel. Normal appendix. Normal large bowel with no diverticulosis, large bowel wall thickening or pericolonic fat stranding. Vascular/Lymphatic: Normal caliber abdominal aorta. Patent portal, splenic, hepatic and renal veins. Large paraumbilical varices. Large  proximal gastric varices. No pathologically enlarged lymph nodes in the abdomen or pelvis. Reproductive: Normal size prostate Other: No pneumoperitoneum. Moderate volume simple ascites. No focal fluid collection. Anasarca. Musculoskeletal: No aggressive appearing focal osseous lesions. Sequela of congenital right hip dysplasia. Mild thoracic spondylosis. IMPRESSION: 1. Cirrhosis. Two subcentimeter hypodense right liver lobe lesions, too small to characterize. Recommend follow-up MRI abdomen without and with IV contrast in 3-6 months. This recommendation follows ACR consensus guidelines: Managing Incidental Findings on Abdominal CT: White Paper of the ACR Incidental Findings Committee. J Am Coll Radiol 2010;7:754-773. 2. Stigmata of portal hypertension including mild splenomegaly, moderate volume ascites and large paraumbilical and proximal gastric and small lower esophageal varices. 3. Mild wall thickening throughout the proximal to mid small bowel. Mild wall thickening in gallbladder. Anasarca. These findings are nonspecific and probably due to noninflammatory edema (secondary to hypoalbuminemia). 4. Nonobstructing bilateral renal stones. No hydronephrosis. Electronically Signed   By: Delbert PhenixJason A Poff M.D.   On: 04/30/2018 23:01   [place] Abdomen Limited  Result Date: 05/01/2018 CLINICAL DATA:  Acute onset of generalized abdominal pain. Hepatic cirrhosis and hyperbilirubinemia. EXAM: ULTRASOUND ABDOMEN LIMITED RIGHT UPPER QUADRANT COMPARISON:  CT of the abdomen and pelvis from 04/30/2018 FINDINGS: Gallbladder: Diffuse gallbladder wall thickening is noted, measuring 6 mm. Pericholecystic fluid is nonspecific in the presence of ascites. Evaluation for ultrasonographic Murphy's sign is not possible as the patient was unresponsive during the study. No definite stones are seen. Common bile duct: Diameter: 0.6 cm, within normal limits in caliber. Liver: No focal lesion identified. The nodular contour of the liver is compatible  with hepatic cirrhosis. Increased parenchymal echogenicity is noted. Portal vein is patent on color Doppler imaging with normal direction of blood flow towards the liver. Small to moderate volume ascites is noted at the right upper quadrant. IMPRESSION: 1. Diffuse gallbladder wall thickening. Pericholecystic fluid is nonspecific in the presence of ascites. No definite stones seen. Cholecystitis cannot be excluded, but is not well characterized given ascites. No evidence of obstruction. 2. Changes of hepatic cirrhosis. 3. Small to moderate volume residual ascites at the right upper quadrant, status post recent paracentesis. Electronically Signed   By: Leotis ShamesJeffery  Chang M.D.   On: 05/01/2018 03:06    Impression / Plan:   Impression: 1.  Decompensated alcoholic cirrhosis with encephalopathy: With a MELD of 38, INR of 8, ammonia 190 2.  Macrocytic anemia, thrombocytopenia: Hemoglobin 10, platelets 52,000, likely secondary to alcohol abuse and liver disease 3.  Hypokalemia: Normalized today 4.  Alcohol abuse: Last drink a.m. of 04/30/2018 5.  Liver lesions: Seen on CT, subcentimeter right lobe lesions  Plan: 1.  Plans to transfer patient to [facility], have already made contact with them and we are expecting a call back. 2.  Please await further recommendations from Dr. Barron Alvine.  Thank you for your kind consultation, we will continue to follow.  Violet Baldy Lemmon  05/01/2018, 11:58 AM   Addendum 05/01/17 1426  [facility] has declined transfer. Ordered: Acetylcysteine per pharmacy Vit K x1 CXR  Please await further recs from Dr. Barron Alvine.

## 2018-05-01 NOTE — Progress Notes (Signed)
Pharmacy Antibiotic Note  Kaesyn Stieg is a 38 y.o. male admitted on 04/30/2018 with suspected SBP, fluid analysis pending.  Pharmacy has been consulted for cefepime dosing.  Plan: Cefepime 2gm iv q8hr  Weight: 242 lb 3.2 oz (109.9 kg)  Temp (24hrs), Avg:98.3 F (36.8 C), Min:98 F (36.7 C), Max:98.6 F (37 C)  Recent Labs  Lab 04/30/18 2042 04/30/18 2056 04/30/18 2330 05/01/18 0445  WBC 24.0*  --   --  21.4*  CREATININE 0.63  --   --   --   LATICACIDVEN  --  2.64* 1.7  --     CrCl cannot be calculated (Unknown ideal weight.).    No Known Allergies  Antimicrobials this admission: Cefepime 05/01/2018 >>   Dose adjustments this admission: -  Microbiology results: -  Thank you for allowing pharmacy to be a part of this patient's care.  Aleene Davidson Crowford 05/01/2018 5:42 AM

## 2018-05-01 NOTE — Progress Notes (Signed)
Pt. Has trace blood coming from both IV's and trace blood in inner nose. MD made aware.

## 2018-05-01 NOTE — Progress Notes (Signed)
PHARMACY - PHYSICIAN COMMUNICATION CRITICAL VALUE ALERT - BLOOD CULTURE IDENTIFICATION (BCID)  Jason [name] is an 38 y.o. male who presented to [facility] on 04/30/2018 with a chief complaint of alcoholic cirrhosis.  Assessment:  Currently on Rocephin for possible SBP.  Gram stain of Peritoneal fluid grew nothing. CT abdomen did not suggest abscess. He is afebrile & WBC trending down.   Name of physician (or Provider) ContactedRhona Leavens  Current antibiotics: Rocephin 2gm IV q24h  Changes to prescribed antibiotics recommended: none   Results for orders placed or performed during the hospital encounter of 04/30/18  Blood Culture ID Panel (Reflexed) (Collected: 04/30/2018  9:48 PM)  Result Value Ref Range   Enterococcus species NOT DETECTED NOT DETECTED   Listeria monocytogenes NOT DETECTED NOT DETECTED   Staphylococcus species NOT DETECTED NOT DETECTED   Staphylococcus aureus (BCID) NOT DETECTED NOT DETECTED   Streptococcus species NOT DETECTED NOT DETECTED   Streptococcus agalactiae NOT DETECTED NOT DETECTED   Streptococcus pneumoniae NOT DETECTED NOT DETECTED   Streptococcus pyogenes NOT DETECTED NOT DETECTED   Acinetobacter baumannii NOT DETECTED NOT DETECTED   Enterobacteriaceae species NOT DETECTED NOT DETECTED   Enterobacter cloacae complex NOT DETECTED NOT DETECTED   Escherichia coli NOT DETECTED NOT DETECTED   Klebsiella oxytoca NOT DETECTED NOT DETECTED   Klebsiella pneumoniae NOT DETECTED NOT DETECTED   Proteus species NOT DETECTED NOT DETECTED   Serratia marcescens NOT DETECTED NOT DETECTED   Haemophilus influenzae NOT DETECTED NOT DETECTED   Neisseria meningitidis NOT DETECTED NOT DETECTED   Pseudomonas aeruginosa NOT DETECTED NOT DETECTED   Candida albicans NOT DETECTED NOT DETECTED   Candida glabrata NOT DETECTED NOT DETECTED   Candida krusei NOT DETECTED NOT DETECTED   Candida parapsilosis NOT DETECTED NOT DETECTED   Candida tropicalis NOT DETECTED NOT DETECTED     Elson Clan 05/01/2018  3:37 PM

## 2018-05-02 DIAGNOSIS — X088XXS Exposure to other specified smoke, fire and flames, sequela: Secondary | ICD-10-CM

## 2018-05-02 DIAGNOSIS — F1010 Alcohol abuse, uncomplicated: Secondary | ICD-10-CM

## 2018-05-02 DIAGNOSIS — G9340 Encephalopathy, unspecified: Secondary | ICD-10-CM

## 2018-05-02 DIAGNOSIS — D689 Coagulation defect, unspecified: Secondary | ICD-10-CM

## 2018-05-02 DIAGNOSIS — D72829 Elevated white blood cell count, unspecified: Secondary | ICD-10-CM

## 2018-05-02 DIAGNOSIS — E8809 Other disorders of plasma-protein metabolism, not elsewhere classified: Secondary | ICD-10-CM

## 2018-05-02 DIAGNOSIS — R7881 Bacteremia: Secondary | ICD-10-CM

## 2018-05-02 DIAGNOSIS — T24002S Burn of unspecified degree of unspecified site of left lower limb, except ankle and foot, sequela: Secondary | ICD-10-CM

## 2018-05-02 DIAGNOSIS — K7030 Alcoholic cirrhosis of liver without ascites: Secondary | ICD-10-CM

## 2018-05-02 DIAGNOSIS — K7201 Acute and subacute hepatic failure with coma: Secondary | ICD-10-CM

## 2018-05-02 LAB — COMPREHENSIVE METABOLIC PANEL
ALT: 83 U/L — ABNORMAL HIGH (ref 0–44)
AST: 86 U/L — ABNORMAL HIGH (ref 15–41)
Albumin: 2 g/dL — ABNORMAL LOW (ref 3.5–5.0)
Alkaline Phosphatase: 77 U/L (ref 38–126)
Anion gap: 8 (ref 5–15)
BUN: 15 mg/dL (ref 6–20)
CO2: 21 mmol/L — ABNORMAL LOW (ref 22–32)
CREATININE: 0.52 mg/dL — AB (ref 0.61–1.24)
Calcium: 7.8 mg/dL — ABNORMAL LOW (ref 8.9–10.3)
Chloride: 106 mmol/L (ref 98–111)
GFR calc non Af Amer: >60 mL/min (ref >60)
Glucose, Bld: 148 mg/dL — ABNORMAL HIGH (ref 70–99)
Potassium: 2.5 mmol/L — CL (ref 3.5–5.1)
Sodium: 135 mmol/L (ref 135–145)
Total Bilirubin: 10.4 mg/dL — ABNORMAL HIGH (ref 0.3–1.2)
Total Protein: 4.7 g/dL — ABNORMAL LOW (ref 6.5–8.1)

## 2018-05-02 LAB — PROTIME-INR
INR: 2.74
Prothrombin Time: 28.6 seconds — ABNORMAL HIGH (ref 11.4–15.2)

## 2018-05-02 LAB — HEPATITIS PANEL, ACUTE
HCV Ab: 0.1 s/co ratio (ref 0.0–0.9)
HEP B S AG: NEGATIVE
Hep A IgM: NEGATIVE
Hep B C IgM: NEGATIVE

## 2018-05-02 LAB — CBC
HCT: 26.6 % — ABNORMAL LOW (ref 39.0–52.0)
Hemoglobin: 9 g/dL — ABNORMAL LOW (ref 13.0–17.0)
MCH: 35.9 pg — ABNORMAL HIGH (ref 26.0–34.0)
MCHC: 33.8 g/dL (ref 30.0–36.0)
MCV: 106 fL — ABNORMAL HIGH (ref 80.0–100.0)
NRBC: 0.3 % — AB (ref 0.0–0.2)
Platelets: 34 10*3/uL — ABNORMAL LOW (ref 150–400)
RBC: 2.51 MIL/uL — ABNORMAL LOW (ref 4.22–5.81)
RDW: 18.6 % — ABNORMAL HIGH (ref 11.5–15.5)
WBC: 15.9 10*3/uL — ABNORMAL HIGH (ref 4.0–10.5)

## 2018-05-02 LAB — AMMONIA: Ammonia: 68 umol/L — ABNORMAL HIGH (ref 9–35)

## 2018-05-02 MED ORDER — FUROSEMIDE 10 MG/ML IJ SOLN
20.0000 mg | Freq: Every day | INTRAMUSCULAR | Status: DC
  Administered 2018-05-02 – 2018-05-08 (×7): 20 mg via INTRAVENOUS
  Filled 2018-05-02 (×7): qty 2

## 2018-05-02 MED ORDER — POTASSIUM CHLORIDE 10 MEQ/100ML IV SOLN
10.0000 meq | INTRAVENOUS | Status: AC
  Administered 2018-05-02 (×5): 10 meq via INTRAVENOUS
  Filled 2018-05-02 (×7): qty 100

## 2018-05-02 MED ORDER — FOLIC ACID 5 MG/ML IJ SOLN
1.0000 mg | Freq: Every day | INTRAMUSCULAR | Status: DC
  Administered 2018-05-02 – 2018-05-05 (×4): 1 mg via INTRAVENOUS
  Filled 2018-05-02 (×4): qty 0.2

## 2018-05-02 MED ORDER — PANTOPRAZOLE SODIUM 40 MG IV SOLR
40.0000 mg | INTRAVENOUS | Status: DC
  Administered 2018-05-02 – 2018-05-04 (×3): 40 mg via INTRAVENOUS
  Filled 2018-05-02 (×4): qty 40

## 2018-05-02 NOTE — Progress Notes (Signed)
PHARMACY - PHYSICIAN COMMUNICATION CRITICAL VALUE ALERT - BLOOD CULTURE IDENTIFICATION (BCID)  Jason [name] is an 38 y.o. male who presented to [facility] on 04/30/2018   Assessment: BCID positive for 4/4 rothia mucilaginosa  Name of physician (or Provider) Contacted: Dr. Rhona Leavens  Current antibiotics: Ceftriaxone 2 g IV daily  Changes to prescribed antibiotics recommended: No changes at this time - ID consulted.   Results for orders placed or performed during the hospital encounter of 04/30/18  Blood Culture ID Panel (Reflexed) (Collected: 04/30/2018  9:48 PM)  Result Value Ref Range   Enterococcus species NOT DETECTED NOT DETECTED   Listeria monocytogenes NOT DETECTED NOT DETECTED   Staphylococcus species NOT DETECTED NOT DETECTED   Staphylococcus aureus (BCID) NOT DETECTED NOT DETECTED   Streptococcus species NOT DETECTED NOT DETECTED   Streptococcus agalactiae NOT DETECTED NOT DETECTED   Streptococcus pneumoniae NOT DETECTED NOT DETECTED   Streptococcus pyogenes NOT DETECTED NOT DETECTED   Acinetobacter baumannii NOT DETECTED NOT DETECTED   Enterobacteriaceae species NOT DETECTED NOT DETECTED   Enterobacter cloacae complex NOT DETECTED NOT DETECTED   Escherichia coli NOT DETECTED NOT DETECTED   Klebsiella oxytoca NOT DETECTED NOT DETECTED   Klebsiella pneumoniae NOT DETECTED NOT DETECTED   Proteus species NOT DETECTED NOT DETECTED   Serratia marcescens NOT DETECTED NOT DETECTED   Haemophilus influenzae NOT DETECTED NOT DETECTED   Neisseria meningitidis NOT DETECTED NOT DETECTED   Pseudomonas aeruginosa NOT DETECTED NOT DETECTED   Candida albicans NOT DETECTED NOT DETECTED   Candida glabrata NOT DETECTED NOT DETECTED   Candida krusei NOT DETECTED NOT DETECTED   Candida parapsilosis NOT DETECTED NOT DETECTED   Candida tropicalis NOT DETECTED NOT DETECTED    Cindi Carbon, PharmD 05/02/2018  2:43 PM

## 2018-05-02 NOTE — Progress Notes (Signed)
MD made aware of patient's periods of sleep apnea. Patient O2 saturation drop to low 70% for about 3 seconds then goes back up to 100%.  Will continue to monitor.

## 2018-05-02 NOTE — Progress Notes (Signed)
PROGRESS NOTE    Jason [name]  BDZ:[id number] DOB: [birth year] DOA: 04/30/2018 PCP: Patient, No Pcp Per    Brief Narrative:  38 y.o. male with medical history significant for alcohol abuse and liver disease, now presenting to emergency department for evaluation of severe abdominal pain with increased abdominal distention, nausea, and nonbloody vomiting.  Patient reports a history of alcohol abuse, states that he saw a physician several months ago and was diagnosed with liver disease and prescribed several medications that he does not recall the names of.  He had intended to stop drinking at that time, but unfortunately has continued with last drink this morning.  Over the past 1 to 2 days, he has developed severe generalized abdominal pain with increased abdominal distention, nausea and nonbloody vomiting.  He denies any melena, hematochezia, or hematemesis.  Has not noted any fevers, but has had some chills.  Denies chest pain, cough, shortness of breath, or dysuria.  ED Course: Upon arrival to the ED, patient is found to be afebrile, saturating well on room air, slightly tachycardic, and with stable blood pressure.  Chemistry panel is notable for potassium of 3.1, albumin 2.1, mild elevation in transaminases, and total bilirubin of 9.8.  CBC features a leukocytosis to 24,000, microcytic anemia with hemoglobin 10.8, and platelets 52,000.  Lactic acid was elevated to 2.64.  INR is elevated to 7.73.  Blood cultures were collected, 2 L normal saline administered, morphine given, and paracentesis was performed with removal of approximately 2 L of turbid fluid.  Sample of peritoneal fluid was sent to the lab for cultures and additional analysis, and the patient was treated with cefepime.  Lactic acid normalized, blood pressure remained stable, and the patient will be observed for further evaluation and management of decompensated liver cirrhosis with concern for SBP.  Assessment & Plan:   Principal  Problem:   Decompensated hepatic cirrhosis (HCC) Active Problems:   SBP (spontaneous bacterial peritonitis) (HCC)   Macrocytic anemia   Thrombocytopenia (HCC)   Hypokalemia   Alcohol abuse   Liver lesion, right lobe   Liver failure (HCC)  1. 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 Initial recommendation to transfer to [facility], however [facility] had decined accepting patient -For now, continue acetylcystine, vitamin K -Poor prognosis. Updated patient's brother at bedside -Consideration for starting steroids in next 1-2 days. Not started initially given concerns of active infection  2. Macrocytic anemia; thrombocytopenia  - Presenting Hgb is 10.8 with MCV 104.6 and platelets 52,000  - He denies any melena, hematochezia, or hematemesis  - Likely secondary to alcohol abuse and liver disease  - Will recheck CBC in AM  3. Hypokalemia  - Low this AM. Will replace  4. Alcohol abuse  - Reports he had intended to quit after learning of his liver problems several months ago, but has continued with last drink the am of 04/30/18. Family questions claim of drinking on day of admit - No evidence of withdrawals a this time - Continue on CIWA  5. Liver lesions  - Subcentimeter right lobe liver lesions noted on CT  - Anticipate follow-up with MRI recommended when more stable  6. Metabolic encephalopathy - likely secondary to hepatic encephalopathy given ammonia just under 200 vs active infection from possible SBP vs  bacteremia per below - Continued patient on scheduled lactulose -Ammonia improved to 68 - Continue empiric abx per above - Continue CIWA per above  7. Rothia mucilaginosa bacteremia - Noted on 2/2 blood cultures -Will discuss with ID -WBC  improved today to 15.9   DVT prophylaxis: SCD's Code Status: Full Family Communication: pt in room, brother at bedside Disposition Plan: Uncertain at this time  Consultants:   GI  ID  Procedures:     Antimicrobials: Anti-infectives (From admission, onward)   Start     Dose/Rate Route Frequency Ordered Stop   05/01/18 1600  cefTRIAXone (ROCEPHIN) 2 g in sodium chloride 0.9 % 100 mL IVPB     2 g 200 mL/hr over 30 Minutes Intravenous Every 24 hours 05/01/18 0930     05/01/18 0600  ceFEPIme (MAXIPIME) 2 g in sodium chloride 0.9 % 100 mL IVPB  Status:  Discontinued     2 g 200 mL/hr over 30 Minutes Intravenous Every 8 hours 05/01/18 0542 05/01/18 0930   04/30/18 2315  ceFEPIme (MAXIPIME) 2 g in sodium chloride 0.9 % 100 mL IVPB     2 g 200 mL/hr over 30 Minutes Intravenous  Once 04/30/18 2310 05/01/18 0042      Subjective: Unable to obtain, encephalopathic  Objective: Vitals:   05/02/18 0800 05/02/18 1000 05/02/18 1200 05/02/18 1400  BP: (!) 144/65 136/72 (!) 159/62 (!) 151/74  Pulse: 93 (!) 104 (!) 128 (!) 107  Resp: 14 15 16 15   Temp: 98.4 F (36.9 C)  98.8 F (37.1 C)   TempSrc: Axillary  Oral   SpO2: 99% 93% 96% 96%  Weight:      Height:        Intake/Output Summary (Last 24 hours) at 05/02/2018 1536 Last data filed at 05/02/2018 1530 Gross per 24 hour  Intake 3407.78 ml  Output 2550 ml  Net 857.78 ml   Filed Weights   05/01/18 0241 05/01/18 1350 05/02/18 0454  Weight: 109.9 kg 107.9 kg 104 kg    Examination: General exam: Awake, laying in bed, in nad Respiratory system: Mildly increased respiratory effort, no wheezing Cardiovascular system: regular rate, s1, s2 Gastrointestinal system: Soft, nondistended, positive BS Central nervous system: CN2-12 grossly intact, strength intact Extremities: Perfused, no clubbing Skin: Normal skin turgor, no notable skin lesions seen Psychiatry: Unable to assess given confusion  Data Reviewed: I have personally  reviewed following labs and imaging studies  CBC: Recent Labs  Lab 04/30/18 2042 05/01/18 0445 05/02/18 0312  WBC 24.0* 21.4* 15.9*  NEUTROABS  --  13.5*  --   HGB 10.8* 10.0* 9.0*  HCT 31.6* 29.2* 26.6*  MCV 104.6* 105.8* 106.0*  PLT 52* 43* 34*   Basic Metabolic Panel: Recent Labs  Lab 04/30/18 2042 05/01/18 0445 05/02/18 0312  NA 139 142 135  K 3.1* 3.5 2.5*  CL 108 109 106  CO2 24 22 21*  GLUCOSE 160* 162* 148*  BUN 19 21* 15  CREATININE 0.63 0.50* 0.52*  CALCIUM 7.9* 8.1* 7.8*  MG  --  1.9  --    GFR: Estimated Creatinine Clearance: 143.1 mL/min (A) (by C-G formula based on SCr of 0.52 mg/dL (L)). Liver Function Tests: Recent Labs  Lab 04/30/18 2042 05/01/18 0445 05/02/18 0312  AST 110* 103* 86*  ALT 104* 97* 83*  ALKPHOS 160* 116 77  BILITOT 9.8* 11.5* 10.4*  PROT 5.6* 5.7* 4.7*  ALBUMIN 2.1* 2.4* 2.0*   Recent Labs  Lab 04/30/18 2042  LIPASE 71*  Recent Labs  Lab 05/01/18 0445 05/02/18 0312  AMMONIA 190* 68*   Coagulation Profile: Recent Labs  Lab 04/30/18 2330 05/01/18 1247 05/02/18 0912  INR 7.73* 2.54 2.74   Cardiac Enzymes: No results for input(s): CKTOTAL, CKMB, CKMBINDEX, TROPONINI in the last 168 hours. BNP (last 3 results) No results for input(s): PROBNP in the last 8760 hours. HbA1C: No results for input(s): HGBA1C in the last 72 hours. CBG: No results for input(s): GLUCAP in the last 168 hours. Lipid Profile: No results for input(s): CHOL, HDL, LDLCALC, TRIG, CHOLHDL, LDLDIRECT in the last 72 hours. Thyroid Function Tests: No results for input(s): TSH, T4TOTAL, FREET4, T3FREE, THYROIDAB in the last 72 hours. Anemia Panel: No results for input(s): VITAMINB12, FOLATE, FERRITIN, TIBC, IRON, RETICCTPCT in the last 72 hours. Sepsis Labs: Recent Labs  Lab 04/30/18 2056 04/30/18 2330 05/01/18 0445  LATICACIDVEN 2.64* 1.7 2.1*    Recent Results (from the past 240 hour(s))  Culture, blood (routine x 2)     Status:  Abnormal (Preliminary result)   Collection Time: 04/30/18  9:06 PM  Result Value Ref Range Status   Specimen Description   Final    BLOOD RIGHT WRIST Performed at [facility], [address] [place], [place] [address]    Special Requests   Final    BOTTLES DRAWN AEROBIC AND ANAEROBIC Blood Culture results may not be optimal due to an excessive volume of blood received in culture bottles Performed at [facility], [address] [place], [place] [address]    Culture  Setup Time   Final    GRAM POSITIVE COCCI IN BOTH AEROBIC AND ANAEROBIC BOTTLES CRITICAL VALUE NOTED.  VALUE IS CONSISTENT WITH PREVIOUSLY REPORTED AND CALLED VALUE. Performed at [facility], [address] [place], [place] [address]    Culture ROTHIA MUCILAGINOSA (A)  Final   Report Status PENDING  Incomplete  Culture, blood (routine x 2)     Status: Abnormal (Preliminary result)   Collection Time: 04/30/18  9:48 PM  Result Value Ref Range Status   Specimen Description   Final    BLOOD LEFT ANTECUBITAL Performed at [facility], [address] [place], [place] [address]    Special Requests   Final    BOTTLES DRAWN AEROBIC AND ANAEROBIC Blood Culture results may not be optimal due to an excessive volume of blood received in culture bottles Performed at [facility], [address] [place], [place] [address]    Culture  Setup Time   Final    GRAM POSITIVE COCCI IN CLUSTERS IN BOTH AEROBIC AND ANAEROBIC BOTTLES CRITICAL RESULT CALLED TO, READ BACK BY AND VERIFIED WITH: Damaris HippoM. Lilliston PharmD 14:20 05/01/18 ([email or web address]) Performed at [facility], [address] [place], [place] [address]    Culture ROTHIA MUCILAGINOSA (A)  Final   Report Status PENDING  Incomplete  Blood Culture ID Panel (Reflexed)     Status: None   Collection Time: 04/30/18  9:48 PM  Result Value Ref Range Status   Enterococcus species NOT DETECTED NOT DETECTED  Final   Listeria monocytogenes NOT DETECTED NOT DETECTED Final   Staphylococcus species NOT DETECTED NOT DETECTED Final   Staphylococcus aureus (BCID) NOT DETECTED NOT DETECTED Final   Streptococcus species NOT DETECTED NOT DETECTED Final   Streptococcus agalactiae NOT DETECTED NOT DETECTED Final   Streptococcus pneumoniae NOT DETECTED NOT DETECTED Final   Streptococcus pyogenes NOT DETECTED NOT DETECTED Final  Acinetobacter baumannii NOT DETECTED NOT DETECTED Final   Enterobacteriaceae species NOT DETECTED NOT DETECTED Final   Enterobacter cloacae complex NOT DETECTED NOT DETECTED Final   Escherichia coli NOT DETECTED NOT DETECTED Final   Klebsiella oxytoca NOT DETECTED NOT DETECTED Final   Klebsiella pneumoniae NOT DETECTED NOT DETECTED Final   Proteus species NOT DETECTED NOT DETECTED Final   Serratia marcescens NOT DETECTED NOT DETECTED Final   Haemophilus influenzae NOT DETECTED NOT DETECTED Final   Neisseria meningitidis NOT DETECTED NOT DETECTED Final   Pseudomonas aeruginosa NOT DETECTED NOT DETECTED Final   Candida albicans NOT DETECTED NOT DETECTED Final   Candida glabrata NOT DETECTED NOT DETECTED Final   Candida krusei NOT DETECTED NOT DETECTED Final   Candida parapsilosis NOT DETECTED NOT DETECTED Final   Candida tropicalis NOT DETECTED NOT DETECTED Final    Comment: Performed at [facility], [address] [place], [place] [address]  Body fluid culture     Status: None (Preliminary result)   Collection Time: 05/01/18 12:10 AM  Result Value Ref Range Status   Specimen Description   Final    PERITONEAL CAVITY Performed at [facility], [address] [place], [place] [address]    Special Requests   Final    NONE Performed at [facility], [address] [place], [place] [address]    Gram Stain   Final    FEW WBC PRESENT,BOTH PMN AND MONONUCLEAR NO ORGANISMS SEEN Performed at [facility], [address]  [place], [place] [address]    Culture NO GROWTH 1 DAY  Final   Report Status PENDING  Incomplete  MRSA PCR Screening     Status: None   Collection Time: 05/01/18 11:52 AM  Result Value Ref Range Status   MRSA by PCR NEGATIVE NEGATIVE Final    Comment:        The GeneXpert MRSA Assay (FDA approved for NASAL specimens only), is one component of a comprehensive MRSA colonization surveillance program. It is not intended to diagnose MRSA infection nor to guide or monitor treatment for MRSA infections. Performed at [facility], [address] [place], [place] [address]      Radiology Studies: Dg Chest 1 View  Result Date: 05/01/2018 CLINICAL DATA:  Leukocytosis EXAM: CHEST  1 VIEW COMPARISON:  None. FINDINGS: Mild cardiomegaly and vascular congestion. Low lung volumes. No confluent opacities, effusions or edema. No acute bony abnormality. IMPRESSION: Low lung volumes with mild cardiomegaly and vascular congestion. Electronically Signed   By: Charlett NoseKevin  Dover M.D.   On: 05/01/2018 13:12   Ct Abdomen Pelvis W Contrast  Result Date: 04/30/2018 CLINICAL DATA:  Cirrhosis. Nausea, vomiting and abdominal pain and distention. EXAM: CT ABDOMEN AND PELVIS WITH CONTRAST TECHNIQUE: Multidetector CT imaging of the abdomen and pelvis was performed using the standard protocol following bolus administration of intravenous contrast. CONTRAST:  100mL ISOVUE-300 IOPAMIDOL (ISOVUE-300) INJECTION 61% COMPARISON:  None. FINDINGS: Lower chest: No significant pulmonary nodules or acute consolidative airspace disease. Small lower esophageal varices. Hepatobiliary: Liver surface is diffusely irregular compatible with cirrhosis. Two scattered subcentimeter hypodense right liver lobe lesions, too small to characterize. No additional liver lesions. Mild diffuse gallbladder wall thickening. No gallbladder distention. No radiopaque cholelithiasis. No biliary ductal dilatation. Pancreas: Normal, with no mass or duct  dilation. Spleen: Mild splenomegaly (craniocaudal splenic length 16.1 cm). No splenic mass. Adrenals/Urinary Tract: Normal adrenals. Nonobstructing stones in the right greater than left kidneys, largest 4  mm in the lower right kidney. No hydronephrosis. No renal masses. Normal bladder. Stomach/Bowel: Stomach mildly distended by fluid and otherwise normal. Normal caliber small bowel. Mild wall thickening throughout the proximal to mid small bowel. Normal appendix. Normal large bowel with no diverticulosis, large bowel wall thickening or pericolonic fat stranding. Vascular/Lymphatic: Normal caliber abdominal aorta. Patent portal, splenic, hepatic and renal veins. Large paraumbilical varices. Large proximal gastric varices. No pathologically enlarged lymph nodes in the abdomen or pelvis. Reproductive: Normal size prostate Other: No pneumoperitoneum. Moderate volume simple ascites. No focal fluid collection. Anasarca. Musculoskeletal: No aggressive appearing focal osseous lesions. Sequela of congenital right hip dysplasia. Mild thoracic spondylosis. IMPRESSION: 1. Cirrhosis. Two subcentimeter hypodense right liver lobe lesions, too small to characterize. Recommend follow-up MRI abdomen without and with IV contrast in 3-6 months. This recommendation follows ACR consensus guidelines: Managing Incidental Findings on Abdominal CT: White Paper of the ACR Incidental Findings Committee. J Am Coll Radiol 2010;7:754-773. 2. Stigmata of portal hypertension including mild splenomegaly, moderate volume ascites and large paraumbilical and proximal gastric and small lower esophageal varices. 3. Mild wall thickening throughout the proximal to mid small bowel. Mild wall thickening in gallbladder. Anasarca. These findings are nonspecific and probably due to noninflammatory edema (secondary to hypoalbuminemia). 4. Nonobstructing bilateral renal stones. No hydronephrosis. Electronically Signed   By: Delbert Phenix M.D.   On: 04/30/2018  23:01   US Abdomen Limited  Result Date: 05/01/2018 CLINICAL DATA:  Acute onset of generalized abdominal pain. Hepatic cirrhosis and hyperbilirubinemia. EXAM: ULTRASOUND ABDOMEN LIMITED RIGHT UPPER QUADRANT COMPARISON:  CT of the abdomen and pelvis from 04/30/2018 FINDINGS: Gallbladder: Diffuse gallbladder wall thickening is noted, measuring 6 mm. Pericholecystic fluid is nonspecific in the presence of ascites. Evaluation for ultrasonographic Murphy's sign is not possible as the patient was unresponsive during the study. No definite stones are seen. Common bile duct: Diameter: 0.6 cm, within normal limits in caliber. Liver: No focal lesion identified. The nodular contour of the liver is compatible with hepatic cirrhosis. Increased parenchymal echogenicity is noted. Portal vein is patent on color Doppler imaging with normal direction of blood flow towards the liver. Small to moderate volume ascites is noted at the right upper quadrant. IMPRESSION: 1. Diffuse gallbladder wall thickening. Pericholecystic fluid is nonspecific in the presence of ascites. No definite stones seen. Cholecystitis cannot be excluded, but is not well characterized given ascites. No evidence of obstruction. 2. Changes of hepatic cirrhosis. 3. Small to moderate volume residual ascites at the right upper quadrant, status post recent paracentesis. Electronically Signed   By: Roanna Raider M.D.   On: 05/01/2018 03:06    Scheduled Meds: . folic acid  1 mg Intravenous Daily  . furosemide  20 mg Intravenous Daily  . lactulose  20 g Oral TID  . LORazepam  0-4 mg Oral Q6H   Followed by  . [START ON 05/03/2018] LORazepam  0-4 mg Oral Q12H  . multivitamin with minerals  1 tablet Oral Daily  . pantoprazole (PROTONIX) IV  40 mg Intravenous Q24H  . sodium chloride flush  3 mL Intravenous Q12H  . thiamine  100 mg Oral Daily   Or  . thiamine  100 mg Intravenous Daily   Continuous Infusions: . sodium chloride 10 mL/hr at 05/02/18 1530  .  cefTRIAXone (ROCEPHIN)  IV Stopped (05/01/18 1809)  . dextrose 5 % 1,000 mL with acetylcysteine (ACETADOTE) 40,000 mg infusion 17 mL/hr at 05/01/18 2112  . [COMPLETED] potassium chloride Stopped (05/02/18 1452)  LOS: 1 day   Rickey Barbara, MD [facility] Pager On Amion  If 7PM-7AM, please contact night-coverage 05/02/2018, 3:36 PM

## 2018-05-02 NOTE — Progress Notes (Signed)
Pt states he is still unable to see. Brother Peyton Najjar) states it started 05/01/2018 before bringing him to the ED.  Sclera and Face visible juandice.

## 2018-05-02 NOTE — Progress Notes (Signed)
Pt had minor nose bleed. Pressure applied. Lasted less than 2 minutes.   MD notified - orders for PRN Afrin

## 2018-05-02 NOTE — Progress Notes (Signed)
Shift event note: Notified by RN that pt's family has arrived and want to sign him out AMA to take him to [facility] for treatment. Went to bedside to speak w/ family to make them aware of risks of taking pt in car to another facility. After some discussion brothers decided to go ahead and take him. Explained AMA process and they are agreeable. Requested family allow RN to attempt to stand pt to see if he is able to bear weight. They agreed. RN reports that pt did stand w/ assistance but then became dizzy and c/o blurred vision that he has had for 5-6 days. Based on this information there was more discussion w/ brothers using interpreter service and the decision was made to wait until am to attempt again to take him to [facility].

## 2018-05-02 NOTE — Consult Note (Signed)
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 ceftriaxone 2gm iv daily - plan to treat from minimum of 14 d - recommend TTE once stable  Leukocytosis = improving while on abtx.   hyperammonia = continue on lactulose to help clear ammonia  Overall condition -guarded

## 2018-05-02 NOTE — Progress Notes (Addendum)
Progress Note   Subjective  Chief Complaint: Abdominal pain, nausea, nonbloody emesis and liver failure  This morning patient is found asleep, he does not wake during time of my exam.  Per nursing he did just pass a stool which they believe was loose but he has been given lactulose.  No changes in mental status overnight.   Objective   Vital signs in last 24 hours: Temp:  [98.2 F (36.8 C)-98.8 F (37.1 C)] 98.4 F (36.9 C) (01/03 0800) Pulse Rate:  [92-143] 93 (01/03 0800) Resp:  [13-24] 14 (01/03 0800) BP: (144-187)/(65-104) 144/65 (01/03 0800) SpO2:  [96 %-99 %] 99 % (01/03 0800) Weight:  [104 kg-107.9 kg] 104 kg (01/03 0454) Last BM Date: 05/02/17 General:   Jaundiced Hispanic male in NAD Heart:  Regular rate and rhythm; no murmurs Lungs: Respirations even and unlabored, lungs CTA bilaterally Abdomen:  Soft, nontender and moderate distension. Normal bowel sounds. Extremities:  B/l pitting edema Psych:  Asleep  Intake/Output from previous day: 01/02 0701 - 01/03 0700 In: 2529.2 [P.O.:1188; I.V.:1269.9; IV Piggyback:71.4] Out: 2450 [Urine:2450]  Lab Results: Recent Labs    04/30/18 2042 05/01/18 0445 05/02/18 0312  WBC 24.0* 21.4* 15.9*  HGB 10.8* 10.0* 9.0*  HCT 31.6* 29.2* 26.6*  PLT 52* 43* 34*   BMET Recent Labs    04/30/18 2042 05/01/18 0445 05/02/18 0312  NA 139 142 135  K 3.1* 3.5 2.5*  CL 108 109 106  CO2 24 22 21*  GLUCOSE 160* 162* 148*  BUN 19 21* 15  CREATININE 0.63 0.50* 0.52*  CALCIUM 7.9* 8.1* 7.8*   LFT Recent Labs    05/02/18 0312  PROT 4.7*  ALBUMIN 2.0*  AST 86*  ALT 83*  ALKPHOS 77  BILITOT 10.4*   PT/INR Recent Labs    04/30/18 2330 05/01/18 1247  LABPROT 63.9* 27.0*  INR 7.73* 2.54    Studies/Results: Dg Chest 1 View  Result Date: 05/01/2018 CLINICAL DATA:  Leukocytosis EXAM: CHEST  1 VIEW COMPARISON:  None. FINDINGS: Mild cardiomegaly and vascular congestion. Low lung volumes. No confluent opacities,  effusions or edema. No acute bony abnormality. IMPRESSION: Low lung volumes with mild cardiomegaly and vascular congestion. Electronically Signed   By: Charlett Nose M.D.   On: 05/01/2018 13:12    US Abdomen Limited  Result Date: 05/01/2018 CLINICAL DATA:  Acute onset of generalized abdominal pain. Hepatic cirrhosis and hyperbilirubinemia. EXAM: ULTRASOUND ABDOMEN LIMITED RIGHT UPPER QUADRANT COMPARISON:  CT of the abdomen and pelvis from 04/30/2018 FINDINGS: Gallbladder: Diffuse gallbladder wall thickening is noted, measuring 6 mm. Pericholecystic fluid is nonspecific in the presence of ascites. Evaluation for ultrasonographic Murphy's sign is not possible as the patient was unresponsive during the study. No definite stones are seen. Common bile duct: Diameter: 0.6 cm, within normal limits in caliber. Liver: No focal lesion identified. The nodular contour of the liver is compatible with hepatic cirrhosis. Increased parenchymal echogenicity is noted. Portal vein is patent on color Doppler imaging with normal direction of blood flow towards the liver. Small to moderate volume ascites is noted at the right upper quadrant. IMPRESSION: 1. Diffuse gallbladder wall thickening. Pericholecystic fluid is nonspecific in the presence of ascites. No definite stones seen. Cholecystitis cannot be excluded, but is not well characterized given ascites. No evidence of obstruction. 2. Changes of hepatic cirrhosis. 3. Small to moderate volume residual ascites at the right upper quadrant, status post recent paracentesis. Electronically Signed   By: Beryle Beams.D.  On: 05/01/2018 03:06    Assessment / Plan:   Assessment: 1.  Decompensated alcoholic cirrhosis with encephalopathy: Severe coagulopathy, jaundice T bili 9.8--> 10.4, hypoalbuminemia, leukocytosis of blood cell count 24--> 15.9, renal function preserved, diagnostic paracentesis with high SAAG consistent with portal hypertension with low protein, gram stain  negative and culture negative, right upper quadrant ultrasound with nodular appearing liver with increased echotexture and small to moderate ascites with gallbladder wall thickening, MELD 38-->27, child Pugh C (15 pts) at admission, hepatitis panel neg 2.  Macrocytic anemia, thrombocytopenia: Severe coagulopathy INR 7.7--> 2.74 3.  Hypokalemia: 2.5 today 4.  Alcohol abuse 5.  Liver lesions  Plan: 1.  Continue current therapies 2.  Again prognosis is poor and quite guarded, we will continue to follow labs 3.  Please await any further recommendations from Dr. Leone Payor later today, Dr. Barron Alvine will follow over the weekend  Thank you for your kind consultation, we will continue to follow.    LOS: 1 day   Unk Lightning  05/02/2018, 9:24 AM    [facility] Attending   I have taken an interval history, reviewed the chart and examined the patient. I agree with the Advanced Practitioner's note, impression and recommendations.   Improved some but remains critically ill with poor prognosis. Alcoholic cirrhosis and liver failure. Consider starting steroids in next 1-2 days if infection w/u negative.  Iva Boop, MD, [facility] 05/02/2018 10:58 AM Pager [phone]

## 2018-05-03 ENCOUNTER — Inpatient Hospital Stay (HOSPITAL_COMMUNITY): Payer: Self-pay

## 2018-05-03 DIAGNOSIS — R7881 Bacteremia: Secondary | ICD-10-CM

## 2018-05-03 LAB — COMPREHENSIVE METABOLIC PANEL
ALK PHOS: 77 U/L (ref 38–126)
ALT: 88 U/L — ABNORMAL HIGH (ref 0–44)
ANION GAP: 9 (ref 5–15)
AST: 115 U/L — ABNORMAL HIGH (ref 15–41)
Albumin: 2.1 g/dL — ABNORMAL LOW (ref 3.5–5.0)
BUN: 13 mg/dL (ref 6–20)
CHLORIDE: 104 mmol/L (ref 98–111)
CO2: 20 mmol/L — ABNORMAL LOW (ref 22–32)
Calcium: 8 mg/dL — ABNORMAL LOW (ref 8.9–10.3)
Creatinine, Ser: <0.3 mg/dL — ABNORMAL LOW (ref 0.61–1.24)
Glucose, Bld: 167 mg/dL — ABNORMAL HIGH (ref 70–99)
Potassium: 3.1 mmol/L — ABNORMAL LOW (ref 3.5–5.1)
Sodium: 133 mmol/L — ABNORMAL LOW (ref 135–145)
Total Bilirubin: 11 mg/dL — ABNORMAL HIGH (ref 0.3–1.2)
Total Protein: 4.9 g/dL — ABNORMAL LOW (ref 6.5–8.1)

## 2018-05-03 LAB — CULTURE, BLOOD (ROUTINE X 2)

## 2018-05-03 LAB — CBC
HEMATOCRIT: 28 % — AB (ref 39.0–52.0)
Hemoglobin: 9.5 g/dL — ABNORMAL LOW (ref 13.0–17.0)
MCH: 36.1 pg — ABNORMAL HIGH (ref 26.0–34.0)
MCHC: 33.9 g/dL (ref 30.0–36.0)
MCV: 106.5 fL — ABNORMAL HIGH (ref 80.0–100.0)
Platelets: 21 10*3/uL — CL (ref 150–400)
RBC: 2.63 MIL/uL — AB (ref 4.22–5.81)
RDW: 18.5 % — ABNORMAL HIGH (ref 11.5–15.5)
WBC: 11.6 10*3/uL — ABNORMAL HIGH (ref 4.0–10.5)
nRBC: 0.3 % — ABNORMAL HIGH (ref 0.0–0.2)

## 2018-05-03 LAB — AMMONIA: Ammonia: 68 umol/L — ABNORMAL HIGH (ref 9–35)

## 2018-05-03 LAB — ECHOCARDIOGRAM COMPLETE
Height: 65 in
Weight: 3555.58 oz

## 2018-05-03 LAB — PROTIME-INR
INR: 2.68
PROTHROMBIN TIME: 28.1 s — AB (ref 11.4–15.2)

## 2018-05-03 MED ORDER — POTASSIUM CHLORIDE 10 MEQ/100ML IV SOLN
10.0000 meq | INTRAVENOUS | Status: AC
  Administered 2018-05-03 (×3): 10 meq via INTRAVENOUS
  Filled 2018-05-03 (×4): qty 100

## 2018-05-03 MED ORDER — POTASSIUM CHLORIDE CRYS ER 20 MEQ PO TBCR
20.0000 meq | EXTENDED_RELEASE_TABLET | Freq: Two times a day (BID) | ORAL | Status: DC
  Administered 2018-05-03 (×2): 20 meq via ORAL
  Filled 2018-05-03 (×2): qty 1

## 2018-05-03 NOTE — Progress Notes (Signed)
CRITICAL VALUE ALERT  Critical Value: PLT-21  Date & Time Notied:  05/03/2018 0600  Provider Notified: [facility]  Orders Received/Actions taken: Awaiting orders

## 2018-05-03 NOTE — Progress Notes (Signed)
Attempted echo.  Patient currently eating.  Will attempt later once done eating.

## 2018-05-03 NOTE — Progress Notes (Signed)
PROGRESS NOTE    Jason BougieJose [name]  AVW:[id number] DOB: 08-Sep-1980 DOA: 04/30/2018 PCP: Patient, No Pcp Per    Brief Narrative:  38 y.o. male with medical history significant for alcohol abuse and liver disease, now presenting to emergency department for evaluation of severe abdominal pain with increased abdominal distention, nausea, and nonbloody vomiting.  Patient reports a history of alcohol abuse, states that he saw a physician several months ago and was diagnosed with liver disease and prescribed several medications that he does not recall the names of.  He had intended to stop drinking at that time, but unfortunately has continued with last drink this morning.  Over the past 1 to 2 days, he has developed severe generalized abdominal pain with increased abdominal distention, nausea and nonbloody vomiting.  He denies any melena, hematochezia, or hematemesis.  Has not noted any fevers, but has had some chills.  Denies chest pain, cough, shortness of breath, or dysuria.  ED Course: Upon arrival to the ED, patient is found to be afebrile, saturating well on room air, slightly tachycardic, and with stable blood pressure.  Chemistry panel is notable for potassium of 3.1, albumin 2.1, mild elevation in transaminases, and total bilirubin of 9.8.  CBC features a leukocytosis to 24,000, microcytic anemia with hemoglobin 10.8, and platelets 52,000.  Lactic acid was elevated to 2.64.  INR is elevated to 7.73.  Blood cultures were collected, 2 L normal saline administered, morphine given, and paracentesis was performed with removal of approximately 2 L of turbid fluid.  Sample of peritoneal fluid was sent to the lab for cultures and additional analysis, and the patient was treated with cefepime.  Lactic acid normalized, blood pressure remained stable, and the patient will be observed for further evaluation and management of decompensated liver cirrhosis with concern for SBP.  Assessment & Plan:   Principal  Problem:   Decompensated hepatic cirrhosis (HCC) Active Problems:   SBP (spontaneous bacterial peritonitis) (HCC)   Macrocytic anemia   Thrombocytopenia (HCC)   Hypokalemia   Alcohol abuse   Liver lesion, right lobe   Liver failure (HCC)  1. 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 [facility], however [facility] had decined accepting patient -For now, continue acetylcystine, vitamin K -Poor prognosis although pt clinically improved today  2. Macrocytic anemia; thrombocytopenia  - Presenting Hgb is 10.8 with MCV 104.6 and platelets 52,000  - He denies any melena, hematochezia, or hematemesis  - Likely secondary to alcohol abuse and liver disease  - Plts down to the 20k range  3. Hypokalemia  - Hypokalemic. Replaced  4. Alcohol abuse  - Reports he had intended to quit after learning of his liver problems several months ago, but has continued with last drink the am of 04/30/18. Family questions claim of drinking on day of admit - No evidence of withdrawals a this time - Continue with CIWA  5. Liver lesions  - Subcentimeter right lobe liver lesions noted on CT  - Anticipate follow-up with MRI recommended when more stable  6. Metabolic encephalopathy - likely secondary to hepatic encephalopathy given ammonia just under 200 vs active infection from possible SBP vs bacteremia per below - Continued patient on scheduled lactulose -Ammonia improved to 68 - Continue empiric abx per  above - Continue CIWA per above  7. Rothia mucilaginosa bacteremia - Noted on 2/2 blood cultures -Will discuss with ID -WBC improved to 11.6k today  DVT prophylaxis: SCD's Code Status: Full Family Communication: pt in room, brother  at bedside Disposition Plan: Uncertain at this time  Consultants:   GI  ID  Procedures:     Antimicrobials: Anti-infectives (From admission, onward)   Start     Dose/Rate Route Frequency Ordered Stop   05/01/18 1600  cefTRIAXone (ROCEPHIN) 2 g in sodium chloride 0.9 % 100 mL IVPB     2 g 200 mL/hr over 30 Minutes Intravenous Every 24 hours 05/01/18 0930     05/01/18 0600  ceFEPIme (MAXIPIME) 2 g in sodium chloride 0.9 % 100 mL IVPB  Status:  Discontinued     2 g 200 mL/hr over 30 Minutes Intravenous Every 8 hours 05/01/18 0542 05/01/18 0930   04/30/18 2315  ceFEPIme (MAXIPIME) 2 g in sodium chloride 0.9 % 100 mL IVPB     2 g 200 mL/hr over 30 Minutes Intravenous  Once 04/30/18 2310 05/01/18 0042      Subjective: Wanting to eat regular food. Complains of abd discomfort  Objective: Vitals:   05/03/18 0200 05/03/18 0345 05/03/18 0400 05/03/18 0530  BP: (!) 158/73  (!) 149/87   Pulse: 96  88   Resp:   14   Temp:  (!) 97.4 F (36.3 C)    TempSrc:  Oral    SpO2:   100%   Weight:    100.8 kg  Height:        Intake/Output Summary (Last 24 hours) at 05/03/2018 0837 Last data filed at 05/02/2018 2324 Gross per 24 hour  Intake 1558.33 ml  Output 2300 ml  Net -741.67 ml   Filed Weights   05/01/18 1350 05/02/18 0454 05/03/18 0530  Weight: 107.9 kg 104 kg 100.8 kg    Examination: General exam: Conversant, in no acute distress Respiratory system: normal chest rise, clear, no audible wheezing Cardiovascular system: regular rhythm, s1-s2 Gastrointestinal system: Nondistended, nontender, pos BS Central nervous system: No seizures, no tremors Extremities: No cyanosis, no joint deformities Skin: No rashes, no pallor, jaundiced Psychiatry: Affect normal // no auditory hallucinations   Data Reviewed: I have personally reviewed following labs and imaging studies  CBC: Recent Labs  Lab 04/30/18 2042 05/01/18 0445 05/02/18 0312 05/03/18 0308  WBC 24.0* 21.4* 15.9* 11.6*   NEUTROABS  --  13.5*  --   --   HGB 10.8* 10.0* 9.0* 9.5*  HCT 31.6* 29.2* 26.6* 28.0*  MCV 104.6* 105.8* 106.0* 106.5*  PLT 52* 43* 34* 21*   Basic Metabolic Panel: Recent Labs  Lab 04/30/18 2042 05/01/18 0445 05/02/18 0312 05/03/18 0308  NA 139 142 135 133*  K 3.1* 3.5 2.5* 3.1*  CL 108 109 106 104  CO2 24 22 21* 20*  GLUCOSE 160* 162* 148* 167*  BUN 19 21* 15 13  CREATININE 0.63 0.50* 0.52* <0.30*  CALCIUM 7.9* 8.1* 7.8* 8.0*  MG  --  1.9  --   --    GFR: CrCl cannot be calculated (This lab value cannot be used to calculate CrCl because it is not a number: <0.30). Liver Function Tests: Recent Labs  Lab 04/30/18 2042 05/01/18 0445 05/02/18 0312 05/03/18 0308  AST 110* 103* 86* 115*  ALT 104* 97* 83* 88*  ALKPHOS 160* 116 77 77  BILITOT 9.8* 11.5* 10.4* 11.0*  PROT 5.6* 5.7* 4.7* 4.9*  ALBUMIN 2.1*  2.4* 2.0* 2.1*   Recent Labs  Lab 04/30/18 2042  LIPASE 71*   Recent Labs  Lab 05/01/18 0445 05/02/18 0312 05/03/18 0525  AMMONIA 190* 68* 68*   Coagulation Profile: Recent Labs  Lab 04/30/18 2330 05/01/18 1247 05/02/18 0912 05/03/18 0308  INR 7.73* 2.54 2.74 2.68   Cardiac Enzymes: No results for input(s): CKTOTAL, CKMB, CKMBINDEX, TROPONINI in the last 168 hours. BNP (last 3 results) No results for input(s): PROBNP in the last 8760 hours. HbA1C: No results for input(s): HGBA1C in the last 72 hours. CBG: No results for input(s): GLUCAP in the last 168 hours. Lipid Profile: No results for input(s): CHOL, HDL, LDLCALC, TRIG, CHOLHDL, LDLDIRECT in the last 72 hours. Thyroid Function Tests: No results for input(s): TSH, T4TOTAL, FREET4, T3FREE, THYROIDAB in the last 72 hours. Anemia Panel: No results for input(s): VITAMINB12, FOLATE, FERRITIN, TIBC, IRON, RETICCTPCT in the last 72 hours. Sepsis Labs: Recent Labs  Lab 04/30/18 2056 04/30/18 2330 05/01/18 0445  LATICACIDVEN 2.64* 1.7 2.1*    Recent Results (from the past 240 hour(s))    Culture, blood (routine x 2)     Status: Abnormal (Preliminary result)   Collection Time: 04/30/18  9:06 PM  Result Value Ref Range Status   Specimen Description   Final    BLOOD RIGHT WRIST Performed at [facility], [address], [place], [place] [address]    Special Requests   Final    BOTTLES DRAWN AEROBIC AND ANAEROBIC Blood Culture results may not be optimal due to an excessive volume of blood received in culture bottles Performed at [facility], [address] [place], [place] [address]    Culture  Setup Time   Final    GRAM POSITIVE COCCI IN BOTH AEROBIC AND ANAEROBIC BOTTLES CRITICAL VALUE NOTED.  VALUE IS CONSISTENT WITH PREVIOUSLY REPORTED AND CALLED VALUE. Performed at [facility], [address], [place], [place] [address]    Culture ROTHIA MUCILAGINOSA (A)  Final   Report Status PENDING  Incomplete  Culture, blood (routine x 2)     Status: Abnormal (Preliminary result)   Collection Time: 04/30/18  9:48 PM  Result Value Ref Range Status   Specimen Description   Final    BLOOD LEFT ANTECUBITAL Performed at [facility], [address], [place], [place] [address]    Special Requests   Final    BOTTLES DRAWN AEROBIC AND ANAEROBIC Blood Culture results may not be optimal due to an excessive volume of blood received in culture bottles Performed at [facility], [address], [place], [place] [address]    Culture  Setup Time   Final    GRAM POSITIVE COCCI IN CLUSTERS IN BOTH AEROBIC AND ANAEROBIC BOTTLES CRITICAL RESULT CALLED TO, READ BACK BY AND VERIFIED WITH: Damaris Hippo PharmD 14:20 05/01/18 ([email or web address]) Performed at [facility], [address], [place], [place] [address]    Culture ROTHIA MUCILAGINOSA (A)  Final   Report Status PENDING  Incomplete  Blood Culture ID Panel (Reflexed)     Status: None   Collection Time: 04/30/18  9:48 PM  Result Value Ref Range Status    Enterococcus species NOT DETECTED NOT DETECTED Final   Listeria monocytogenes NOT DETECTED NOT DETECTED Final   Staphylococcus species NOT DETECTED NOT DETECTED Final   Staphylococcus aureus (BCID) NOT DETECTED NOT DETECTED Final   Streptococcus species NOT DETECTED NOT DETECTED Final   Streptococcus agalactiae NOT DETECTED  NOT DETECTED Final   Streptococcus pneumoniae NOT DETECTED NOT DETECTED Final   Streptococcus pyogenes NOT DETECTED NOT DETECTED Final   Acinetobacter baumannii NOT DETECTED NOT DETECTED Final   Enterobacteriaceae species NOT DETECTED NOT DETECTED Final   Enterobacter cloacae complex NOT DETECTED NOT DETECTED Final   Escherichia coli NOT DETECTED NOT DETECTED Final   Klebsiella oxytoca NOT DETECTED NOT DETECTED Final   Klebsiella pneumoniae NOT DETECTED NOT DETECTED Final   Proteus species NOT DETECTED NOT DETECTED Final   Serratia marcescens NOT DETECTED NOT DETECTED Final   Haemophilus influenzae NOT DETECTED NOT DETECTED Final   Neisseria meningitidis NOT DETECTED NOT DETECTED Final   Pseudomonas aeruginosa NOT DETECTED NOT DETECTED Final   Candida albicans NOT DETECTED NOT DETECTED Final   Candida glabrata NOT DETECTED NOT DETECTED Final   Candida krusei NOT DETECTED NOT DETECTED Final   Candida parapsilosis NOT DETECTED NOT DETECTED Final   Candida tropicalis NOT DETECTED NOT DETECTED Final    Comment: Performed at [facility], [address] [place], [place] [address]  Body fluid culture     Status: None (Preliminary result)   Collection Time: 05/01/18 12:10 AM  Result Value Ref Range Status   Specimen Description   Final    PERITONEAL CAVITY Performed at [facility], [address] [place], [place] [address]    Special Requests   Final    NONE Performed at [facility], [address] [place], [place] [address]    Gram Stain   Final    FEW WBC PRESENT,BOTH PMN AND MONONUCLEAR NO ORGANISMS SEEN Performed  at [facility], [address] [place], [place] [address]    Culture NO GROWTH 1 DAY  Final   Report Status PENDING  Incomplete  MRSA PCR Screening     Status: None   Collection Time: 05/01/18 11:52 AM  Result Value Ref Range Status   MRSA by PCR NEGATIVE NEGATIVE Final    Comment:        The GeneXpert MRSA Assay (FDA approved for NASAL specimens only), is one component of a comprehensive MRSA colonization surveillance program. It is not intended to diagnose MRSA infection nor to guide or monitor treatment for MRSA infections. Performed at [facility], [address] [place], [place] [address]   Culture, blood (routine x 2)     Status: None (Preliminary result)   Collection Time: 05/02/18  5:39 PM  Result Value Ref Range Status   Specimen Description BLOOD RIGHT ARM  Final   Special Requests   Final    BOTTLES DRAWN AEROBIC AND ANAEROBIC Blood Culture adequate volume Performed at [facility], [address] [place], [place] [address]    Culture NO GROWTH < 12 HOURS  Final   Report Status PENDING  Incomplete  Culture, blood (routine x 2)     Status: None (Preliminary result)   Collection Time: 05/02/18  5:39 PM  Result Value Ref Range Status   Specimen Description BLOOD RIGHT HAND  Final   Special Requests   Final    BOTTLES DRAWN AEROBIC ONLY Blood Culture adequate volume Performed at [facility], [address] [place], [place] [address]    Culture NO GROWTH < 12 HOURS  Final   Report Status PENDING  Incomplete     Radiology Studies: Dg Chest 1 View  Result Date: 05/01/2018 CLINICAL DATA:  Leukocytosis EXAM: CHEST  1 VIEW COMPARISON:  None. FINDINGS: Mild cardiomegaly and vascular congestion. Low lung volumes. No confluent opacities, effusions or edema. No acute bony abnormality. IMPRESSION: Low lung volumes with mild cardiomegaly and vascular congestion. Electronically Signed   By: Charlett Nose M.D.    On: 05/01/2018 13:12    Scheduled Meds: . folic acid  1 mg Intravenous Daily  . furosemide  20 mg Intravenous Daily  . lactulose  20 g Oral TID  . LORazepam  0-4 mg Oral Q12H  . multivitamin with minerals  1 tablet Oral Daily  . pantoprazole (PROTONIX) IV  40 mg Intravenous Q24H  . potassium chloride  20 mEq Oral BID  . sodium chloride flush  3 mL Intravenous Q12H  . thiamine  100 mg Oral Daily   Or  . thiamine  100 mg Intravenous Daily   Continuous Infusions: . sodium chloride 10 mL/hr at 05/02/18 2200  . cefTRIAXone (ROCEPHIN)  IV Stopped (05/02/18 1622)  . dextrose 5 % 1,000 mL with acetylcysteine (ACETADOTE) 40,000 mg infusion 17 mL/hr at 05/02/18 2200  . potassium chloride 10 mEq (05/03/18 0835)     LOS: 2 days   Rickey Barbara, MD [facility] Pager On Amion  If 7PM-7AM, please contact night-coverage 05/03/2018, 8:37 AM

## 2018-05-03 NOTE — Progress Notes (Signed)
    Progress Note   Subjective  Chief Complaint: Abdominal pain, nausea, nonbloody emesis and cirrhosis with liver failure  This morning patient is actually lucid.  Was able to use the interpreter to speak with him.  He tells me he has been at least 4-1/2 months since his last alcoholic drink.  Currently denies any abdominal pain or discomfort but does tell me that he is hungry.   Objective   Vital signs in last 24 hours: Temp:  [97.4 F (36.3 C)-98.8 F (37.1 C)] 97.4 F (36.3 C) (01/04 0345) Pulse Rate:  [88-128] 88 (01/04 0400) Resp:  [12-17] 14 (01/04 0400) BP: (139-159)/(62-87) 149/87 (01/04 0400) SpO2:  [96 %-100 %] 100 % (01/04 0400) Weight:  [100.8 kg] 100.8 kg (01/04 0530) Last BM Date: 05/02/17 General:    Jaundiced Hispanic male in NAD Heart:  Regular rate and rhythm; no murmurs Lungs: Respirations even and unlabored, lungs CTA bilaterally Abdomen:  Soft, nontender and mild distention. Normal bowel sounds. Extremities: Bilateral lower edema Neurologic:  Alert and oriented,  grossly normal neurologically. Psych:  Cooperative. Normal mood and affect.  Intake/Output from previous day: 01/03 0701 - 01/04 0700 In: 1827.4 [P.O.:480; I.V.:662.9; IV Piggyback:684.5] Out: 2300 [Urine:2300]  Lab Results: Recent Labs    05/01/18 0445 05/02/18 0312 05/03/18 0308  WBC 21.4* 15.9* 11.6*  HGB 10.0* 9.0* 9.5*  HCT 29.2* 26.6* 28.0*  PLT 43* 34* 21*   BMET Recent Labs    05/01/18 0445 05/02/18 0312 05/03/18 0308  NA 142 135 133*  K 3.5 2.5* 3.1*  CL 109 106 104  CO2 22 21* 20*  GLUCOSE 162* 148* 167*  BUN 21* 15 13  CREATININE 0.50* 0.52* <0.30*  CALCIUM 8.1* 7.8* 8.0*   LFT Recent Labs    05/03/18 0308  PROT 4.9*  ALBUMIN 2.1*  AST 115*  ALT 88*  ALKPHOS 77  BILITOT 11.0*   PT/INR Recent Labs    05/02/18 0912 05/03/18 0308  LABPROT 28.6* 28.1*  INR 2.74 2.68    Studies/Results: Dg Chest 1 View  Result Date: 05/01/2018 CLINICAL DATA:   Leukocytosis EXAM: CHEST  1 VIEW COMPARISON:  None. FINDINGS: Mild cardiomegaly and vascular congestion. Low lung volumes. No confluent opacities, effusions or edema. No acute bony abnormality. IMPRESSION: Low lung volumes with mild cardiomegaly and vascular congestion. Electronically Signed   By: Charlett Nose M.D.   On: 05/01/2018 13:12       Assessment / Plan:   Assessment: 1.  Decompensated alcoholic cirrhosis with encephalopathy: Severe coagulopathy, jaundice, T bili 9.8--> 10.4--> 11, hypoalbuminemia, leukocytosis improving, renal function preserved, diagnostic paracentesis with signs of portal hypertension, hepatitis panel negative 2.  Macrocytic anemia, thrombocytopenia 3.  Hypokalemia 4.  Alcohol abuse 5.  Liver lesions  Plan: 1.  Continue current therapies 2.  Please await any further recommendations from Dr. Barron Alvine later today.  Thank you for your kind consultation.  We will continue to follow.   LOS: 2 days   Unk Lightning  05/03/2018, 10:20 AM

## 2018-05-03 NOTE — Progress Notes (Signed)
  Echocardiogram 2D Echocardiogram has been performed.  Celene SkeenVijay  Acy Orsak 05/03/2018, 11:48 AM

## 2018-05-04 DIAGNOSIS — R7881 Bacteremia: Secondary | ICD-10-CM

## 2018-05-04 LAB — BODY FLUID CULTURE: Culture: NO GROWTH

## 2018-05-04 LAB — COMPREHENSIVE METABOLIC PANEL
ALK PHOS: 102 U/L (ref 38–126)
ALT: 87 U/L — ABNORMAL HIGH (ref 0–44)
ANION GAP: 8 (ref 5–15)
AST: 104 U/L — ABNORMAL HIGH (ref 15–41)
Albumin: 2.1 g/dL — ABNORMAL LOW (ref 3.5–5.0)
BUN: 9 mg/dL (ref 6–20)
CALCIUM: 7.8 mg/dL — AB (ref 8.9–10.3)
CO2: 21 mmol/L — ABNORMAL LOW (ref 22–32)
Chloride: 104 mmol/L (ref 98–111)
Creatinine, Ser: 0.52 mg/dL — ABNORMAL LOW (ref 0.61–1.24)
GFR calc Af Amer: >60 mL/min (ref >60)
GFR calc non Af Amer: >60 mL/min (ref >60)
Glucose, Bld: 175 mg/dL — ABNORMAL HIGH (ref 70–99)
POTASSIUM: 3.2 mmol/L — AB (ref 3.5–5.1)
Sodium: 133 mmol/L — ABNORMAL LOW (ref 135–145)
TOTAL PROTEIN: 5 g/dL — AB (ref 6.5–8.1)
Total Bilirubin: 9.5 mg/dL — ABNORMAL HIGH (ref 0.3–1.2)

## 2018-05-04 LAB — AMMONIA: Ammonia: 55 umol/L — ABNORMAL HIGH (ref 9–35)

## 2018-05-04 MED ORDER — POTASSIUM CHLORIDE CRYS ER 20 MEQ PO TBCR
40.0000 meq | EXTENDED_RELEASE_TABLET | Freq: Two times a day (BID) | ORAL | Status: AC
  Administered 2018-05-04 (×2): 40 meq via ORAL
  Filled 2018-05-04 (×2): qty 2

## 2018-05-04 MED ORDER — POTASSIUM CHLORIDE CRYS ER 20 MEQ PO TBCR
20.0000 meq | EXTENDED_RELEASE_TABLET | Freq: Two times a day (BID) | ORAL | Status: DC
  Administered 2018-05-05 – 2018-05-08 (×7): 20 meq via ORAL
  Filled 2018-05-04 (×7): qty 1

## 2018-05-04 NOTE — Progress Notes (Signed)
PROGRESS NOTE    Rutvik Reuter  ZLD:[id number] DOB: [birth year] DOA: 04/30/2018 PCP: Patient, No Pcp Per    Brief Narrative:  38 y.o. male with medical history significant for alcohol abuse and liver disease, now presenting to emergency department for evaluation of severe abdominal pain with increased abdominal distention, nausea, and nonbloody vomiting.  Patient reports a history of alcohol abuse, states that he saw a physician several months ago and was diagnosed with liver disease and prescribed several medications that he does not recall the names of.  He had intended to stop drinking at that time, but unfortunately has continued with last drink this morning.  Over the past 1 to 2 days, he has developed severe generalized abdominal pain with increased abdominal distention, nausea and nonbloody vomiting.  He denies any melena, hematochezia, or hematemesis.  Has not noted any fevers, but has had some chills.  Denies chest pain, cough, shortness of breath, or dysuria.  ED Course: Upon arrival to the ED, patient is found to be afebrile, saturating well on room air, slightly tachycardic, and with stable blood pressure.  Chemistry panel is notable for potassium of 3.1, albumin 2.1, mild elevation in transaminases, and total bilirubin of 9.8.  CBC features a leukocytosis to 24,000, microcytic anemia with hemoglobin 10.8, and platelets 52,000.  Lactic acid was elevated to 2.64.  INR is elevated to 7.73.  Blood cultures were collected, 2 L normal saline administered, morphine given, and paracentesis was performed with removal of approximately 2 L of turbid fluid.  Sample of peritoneal fluid was sent to the lab for cultures and additional analysis, and the patient was treated with cefepime.  Lactic acid normalized, blood pressure remained stable, and the patient will be observed for further evaluation and management of decompensated liver cirrhosis with concern for SBP.  Assessment & Plan:   Principal  Problem:   Decompensated hepatic cirrhosis (HCC) Active Problems:   SBP (spontaneous bacterial peritonitis) (HCC)   Macrocytic anemia   Thrombocytopenia (HCC)   Hypokalemia   Alcohol abuse   Liver lesion, right lobe   Liver failure (HCC)  1. 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 Initial recommendation to transfer to [facility], however [facility] had decined accepting patient -Pt had been continued on acetylcystine, given vitamin K -Poor prognosis although pt is showing clinical improvement  2. Macrocytic anemia; thrombocytopenia  - Presenting Hgb is 10.8 with MCV 104.6 and platelets 52,000  - He denies any melena, hematochezia, or hematemesis  - Likely secondary to alcohol abuse and liver disease  - Plts down to the 20k range - Repeat CBC in AM  3. Hypokalemia  - Hypokalemic. Replaced  4. Alcohol abuse  - Reports he had intended to quit after learning of his liver problems several months ago, but has continued with last drink the am of 04/30/18. Family questions claim of drinking on day of admit - No evidence of withdrawals a this time - Continued on CIWA  5. Liver lesions  - Subcentimeter right lobe liver lesions noted on CT  - Planning for follow-up with MRI recommended when more stable  6. Metabolic encephalopathy - likely secondary to hepatic encephalopathy given ammonia just under 200 vs active infection from possible SBP vs bacteremia per below - Continued patient on scheduled  lactulose -Ammonia improved to 55 - Continue abx as ordered - Continue CIWA per above  7. Rothia mucilaginosa bacteremia - Noted on 2/2 blood cultures -Appreciate ID input, continue above rocphin -2d echo performed, poor visulalization of valves to r/o  endocarditis -WBC improving  DVT prophylaxis: SCD's Code Status: Full Family Communication: pt in room, brother at bedside Disposition Plan: Uncertain at this time  Consultants:   GI  ID  Procedures:     Antimicrobials: Anti-infectives (From admission, onward)   Start     Dose/Rate Route Frequency Ordered Stop   05/01/18 1600  cefTRIAXone (ROCEPHIN) 2 g in sodium chloride 0.9 % 100 mL IVPB     2 g 200 mL/hr over 30 Minutes Intravenous Every 24 hours 05/01/18 0930     05/01/18 0600  ceFEPIme (MAXIPIME) 2 g in sodium chloride 0.9 % 100 mL IVPB  Status:  Discontinued     2 g 200 mL/hr over 30 Minutes Intravenous Every 8 hours 05/01/18 0542 05/01/18 0930   04/30/18 2315  ceFEPIme (MAXIPIME) 2 g in sodium chloride 0.9 % 100 mL IVPB     2 g 200 mL/hr over 30 Minutes Intravenous  Once 04/30/18 2310 05/01/18 0042      Subjective: Asking about having additional food trays  Objective: Vitals:   05/04/18 0407 05/04/18 0500 05/04/18 0600 05/04/18 0800  BP:  (!) 141/82 135/73   Pulse:  (!) 104 98   Resp:  13 13   Temp: 98.8 F (37.1 C)   98.5 F (36.9 C)  TempSrc: Oral   Oral  SpO2:  100% 99%   Weight:  103.4 kg    Height:        Intake/Output Summary (Last 24 hours) at 05/04/2018 1246 Last data filed at 05/04/2018 0600 Gross per 24 hour  Intake 2512.31 ml  Output 1500 ml  Net 1012.31 ml   Filed Weights   05/02/18 0454 05/03/18 0530 05/04/18 0500  Weight: 104 kg 100.8 kg 103.4 kg    Examination: General exam: Conversant, in no acute distress Respiratory system: normal chest rise, clear, no audible wheezing Cardiovascular system: regular rhythm, s1-s2 Gastrointestinal system: Nondistended, nontender, pos BS Central nervous system: No seizures, no tremors Extremities: No cyanosis, no joint deformities Skin: No rashes, no pallor, jaundiced Psychiatry: Affect normal // no auditory hallucinations   Data Reviewed: I have personally reviewed following labs and imaging  studies  CBC: Recent Labs  Lab 04/30/18 2042 05/01/18 0445 05/02/18 0312 05/03/18 0308  WBC 24.0* 21.4* 15.9* 11.6*  NEUTROABS  --  13.5*  --   --   HGB 10.8* 10.0* 9.0* 9.5*  HCT 31.6* 29.2* 26.6* 28.0*  MCV 104.6* 105.8* 106.0* 106.5*  PLT 52* 43* 34* 21*   Basic Metabolic Panel: Recent Labs  Lab 04/30/18 2042 05/01/18 0445 05/02/18 0312 05/03/18 0308 05/04/18 0312  NA 139 142 135 133* 133*  K 3.1* 3.5 2.5* 3.1* 3.2*  CL 108 109 106 104 104  CO2 24 22 21* 20* 21*  GLUCOSE 160* 162* 148* 167* 175*  BUN 19 21* 15 13 9   CREATININE 0.63 0.50* 0.52* <0.30* 0.52*  CALCIUM 7.9* 8.1* 7.8* 8.0* 7.8*  MG  --  1.9  --   --   --    GFR: Estimated Creatinine Clearance: 142.7 mL/min (A) (by C-G formula based on SCr of 0.52 mg/dL (L)). Liver Function Tests: Recent Labs  Lab 04/30/18 2042 05/01/18 0445 05/02/18 0312 05/03/18 0308 05/04/18 0312  AST 110* 103* 86*  115* 104*  ALT 104* 97* 83* 88* 87*  ALKPHOS 160* 116 77 77 102  BILITOT 9.8* 11.5* 10.4* 11.0* 9.5*  PROT 5.6* 5.7* 4.7* 4.9* 5.0*  ALBUMIN 2.1* 2.4* 2.0* 2.1* 2.1*   Recent Labs  Lab 04/30/18 2042  LIPASE 71*   Recent Labs  Lab 05/01/18 0445 05/02/18 0312 05/03/18 0525 05/04/18 0312  AMMONIA 190* 68* 68* 55*   Coagulation Profile: Recent Labs  Lab 04/30/18 2330 05/01/18 1247 05/02/18 0912 05/03/18 0308  INR 7.73* 2.54 2.74 2.68   Cardiac Enzymes: No results for input(s): CKTOTAL, CKMB, CKMBINDEX, TROPONINI in the last 168 hours. BNP (last 3 results) No results for input(s): PROBNP in the last 8760 hours. HbA1C: No results for input(s): HGBA1C in the last 72 hours. CBG: No results for input(s): GLUCAP in the last 168 hours. Lipid Profile: No results for input(s): CHOL, HDL, LDLCALC, TRIG, CHOLHDL, LDLDIRECT in the last 72 hours. Thyroid Function Tests: No results for input(s): TSH, T4TOTAL, FREET4, T3FREE, THYROIDAB in the last 72 hours. Anemia Panel: No results for input(s):  VITAMINB12, FOLATE, FERRITIN, TIBC, IRON, RETICCTPCT in the last 72 hours. Sepsis Labs: Recent Labs  Lab 04/30/18 2056 04/30/18 2330 05/01/18 0445  LATICACIDVEN 2.64* 1.7 2.1*    Recent Results (from the past 240 hour(s))  Culture, blood (routine x 2)     Status: Abnormal   Collection Time: 04/30/18  9:06 PM  Result Value Ref Range Status   Specimen Description   Final    BLOOD RIGHT WRIST Performed at [facility], [address], [place], [place] [address]    Special Requests   Final    BOTTLES DRAWN AEROBIC AND ANAEROBIC Blood Culture results may not be optimal due to an excessive volume of blood received in culture bottles Performed at [facility], [address], [place], [place] [address]    Culture  Setup Time   Final    GRAM POSITIVE COCCI IN BOTH AEROBIC AND ANAEROBIC BOTTLES CRITICAL VALUE NOTED.  VALUE IS CONSISTENT WITH PREVIOUSLY REPORTED AND CALLED VALUE. Performed at [facility], [address] [place], [place] [address]    Culture Marianna Fuss (A)  Final   Report Status 05/03/2018 FINAL  Final  Culture, blood (routine x 2)     Status: Abnormal   Collection Time: 04/30/18  9:48 PM  Result Value Ref Range Status   Specimen Description   Final    BLOOD LEFT ANTECUBITAL Performed at [facility], [address] [place], [place] [address]    Special Requests   Final    BOTTLES DRAWN AEROBIC AND ANAEROBIC Blood Culture results may not be optimal due to an excessive volume of blood received in culture bottles Performed at [facility], [address] [place], [place] [address]    Culture  Setup Time   Final    GRAM POSITIVE COCCI IN CLUSTERS IN BOTH AEROBIC AND ANAEROBIC BOTTLES CRITICAL RESULT CALLED TO, READ BACK BY AND VERIFIED WITH: Damaris Hippo PharmD 14:20 05/01/18 ([email or web address]) Performed at [facility], [address], [place], [place] [address]    Culture  Marianna Fuss (A)  Final   Report Status 05/03/2018 FINAL  Final  Blood Culture ID Panel (Reflexed)     Status: None   Collection Time: 04/30/18  9:48 PM  Result Value Ref Range Status   Enterococcus species NOT DETECTED NOT DETECTED Final   Listeria monocytogenes NOT DETECTED NOT DETECTED Final  Staphylococcus species NOT DETECTED NOT DETECTED Final   Staphylococcus aureus (BCID) NOT DETECTED NOT DETECTED Final   Streptococcus species NOT DETECTED NOT DETECTED Final   Streptococcus agalactiae NOT DETECTED NOT DETECTED Final   Streptococcus pneumoniae NOT DETECTED NOT DETECTED Final   Streptococcus pyogenes NOT DETECTED NOT DETECTED Final   Acinetobacter baumannii NOT DETECTED NOT DETECTED Final   Enterobacteriaceae species NOT DETECTED NOT DETECTED Final   Enterobacter cloacae complex NOT DETECTED NOT DETECTED Final   Escherichia coli NOT DETECTED NOT DETECTED Final   Klebsiella oxytoca NOT DETECTED NOT DETECTED Final   Klebsiella pneumoniae NOT DETECTED NOT DETECTED Final   Proteus species NOT DETECTED NOT DETECTED Final   Serratia marcescens NOT DETECTED NOT DETECTED Final   Haemophilus influenzae NOT DETECTED NOT DETECTED Final   Neisseria meningitidis NOT DETECTED NOT DETECTED Final   Pseudomonas aeruginosa NOT DETECTED NOT DETECTED Final   Candida albicans NOT DETECTED NOT DETECTED Final   Candida glabrata NOT DETECTED NOT DETECTED Final   Candida krusei NOT DETECTED NOT DETECTED Final   Candida parapsilosis NOT DETECTED NOT DETECTED Final   Candida tropicalis NOT DETECTED NOT DETECTED Final    Comment: Performed at [facility], [address] [place], [place] [address]  Body fluid culture     Status: None   Collection Time: 05/01/18 12:10 AM  Result Value Ref Range Status   Specimen Description   Final    PERITONEAL CAVITY Performed at [facility], [address] [place], [place] [address]    Special Requests   Final    NONE Performed  at [facility], [address] [place], [place] [address]    Gram Stain   Final    FEW WBC PRESENT,BOTH PMN AND MONONUCLEAR NO ORGANISMS SEEN Performed at [facility], [address] [place], [place] [address]    Culture NO GROWTH  Final   Report Status 05/04/2018 FINAL  Final  MRSA PCR Screening     Status: None   Collection Time: 05/01/18 11:52 AM  Result Value Ref Range Status   MRSA by PCR NEGATIVE NEGATIVE Final    Comment:        The GeneXpert MRSA Assay (FDA approved for NASAL specimens only), is one component of a comprehensive MRSA colonization surveillance program. It is not intended to diagnose MRSA infection nor to guide or monitor treatment for MRSA infections. Performed at [facility], [address] [place], [place] [address]   Culture, blood (routine x 2)     Status: None (Preliminary result)   Collection Time: 05/02/18  5:39 PM  Result Value Ref Range Status   Specimen Description BLOOD RIGHT ARM  Final   Special Requests   Final    BOTTLES DRAWN AEROBIC AND ANAEROBIC Blood Culture adequate volume Performed at [facility], [address] [place], [place] [address]    Culture NO GROWTH 2 DAYS  Final   Report Status PENDING  Incomplete  Culture, blood (routine x 2)     Status: None (Preliminary result)   Collection Time: 05/02/18  5:39 PM  Result Value Ref Range Status   Specimen Description BLOOD RIGHT HAND  Final   Special Requests   Final    BOTTLES DRAWN AEROBIC ONLY Blood Culture adequate volume Performed at [facility], [address] [place], [place] [address]    Culture NO GROWTH 2 DAYS  Final   Report Status PENDING  Incomplete     Radiology Studies: No results found.  Scheduled Meds: . folic acid  1 mg Intravenous Daily  . furosemide  20 mg Intravenous Daily  . lactulose  20 g Oral TID  . LORazepam  0-4 mg Oral Q12H  . multivitamin with minerals   1 tablet Oral Daily  . pantoprazole (PROTONIX) IV  40 mg Intravenous Q24H  . [START ON 05/05/2018] potassium chloride  20 mEq Oral BID  . potassium chloride  40 mEq Oral BID  . sodium chloride flush  3 mL Intravenous Q12H  . thiamine  100 mg Oral Daily   Or  . thiamine  100 mg Intravenous Daily   Continuous Infusions: . sodium chloride 10 mL/hr at 05/04/18 0600  . cefTRIAXone (ROCEPHIN)  IV Stopped (05/03/18 1632)  . dextrose 5 % 1,000 mL with acetylcysteine (ACETADOTE) 40,000 mg infusion Stopped (05/04/18 1246)     LOS: 3 days   Rickey BarbaraStephen Krystelle Prashad, MD [facility] Pager On Amion  If 7PM-7AM, please contact night-coverage 05/04/2018, 12:46 PM

## 2018-05-04 NOTE — Progress Notes (Signed)
    Progress Note   Subjective  Chief Complaint: Abdominal pain, nausea, cirrhosis of the liver likely due to alcohol and bacteremia  This morning patient continues to be alert and responsive.  He is eating well and asks questions in regards to his low sodium diet.  Denies any abdominal pain.  Tells me he is having bowel movements but they are "slightly looser than normal".  Denies any new complaints or concerns.    Objective   Vital signs in last 24 hours: Temp:  [98.2 F (36.8 C)-99.2 F (37.3 C)] 98.8 F (37.1 C) (01/05 0407) Pulse Rate:  [94-136] 98 (01/05 0600) Resp:  [13-23] 13 (01/05 0600) BP: (105-179)/(53-116) 135/73 (01/05 0600) SpO2:  [95 %-100 %] 99 % (01/05 0600) Weight:  [103.4 kg] 103.4 kg (01/05 0500) Last BM Date: 05/03/18 General:    Jaundiced Hispanic male in NAD Heart:  Regular rate and rhythm; no murmurs Lungs: Respirations even and unlabored, lungs CTA bilaterally Abdomen:  Soft, nontender and nondistended. Normal bowel sounds. Extremities:  Without edema. Neurologic:  Alert and oriented,  grossly normal neurologically. Psych:  Cooperative. Normal mood and affect.  Intake/Output from previous day: 01/04 0701 - 01/05 0700 In: 2512.3 [P.O.:1440; I.V.:1072.3] Out: 3500 [Urine:3500]  Lab Results: Recent Labs    05/02/18 0312 05/03/18 0308  WBC 15.9* 11.6*  HGB 9.0* 9.5*  HCT 26.6* 28.0*  PLT 34* 21*   BMET Recent Labs    05/02/18 0312 05/03/18 0308 05/04/18 0312  NA 135 133* 133*  K 2.5* 3.1* 3.2*  CL 106 104 104  CO2 21* 20* 21*  GLUCOSE 148* 167* 175*  BUN 15 13 9   CREATININE 0.52* <0.30* 0.52*  CALCIUM 7.8* 8.0* 7.8*   LFT Recent Labs    05/04/18 0312  PROT 5.0*  ALBUMIN 2.1*  AST 104*  ALT 87*  ALKPHOS 102  BILITOT 9.5*   PT/INR Recent Labs    05/02/18 0912 05/03/18 0308  LABPROT 28.6* 28.1*  INR 2.74 2.68    Assessment / Plan:   Assessment: 1.  Decompensated alcoholic cirrhosis with encephalopathy: Continues to  remain improved with T bili trending down today 2.  Hypokalemia 3.  Alcohol abuse 4.  Liver lesions 5.  Bacteremia  Plan: 1.  Continue antibiotics per ID, improving leukocytosis 2.  Per ID, recommend TTE once stable 3.  We will plan for EGD for variceal screening as an outpatient +/- colonoscopy 4.  T bili is improving today 5.  Status remains guarded given underlying cirrhosis at a young age 38.  Continue current therapies 7.  Please await any further recommendations from Dr. Barron Alvine later today  Thank you for your kind consultation.  We will continue to follow along.   LOS: 3 days   Unk Lightning  05/04/2018, 9:18 AM

## 2018-05-05 LAB — COMPREHENSIVE METABOLIC PANEL
ALT: 80 U/L — ABNORMAL HIGH (ref 0–44)
AST: 95 U/L — ABNORMAL HIGH (ref 15–41)
Albumin: 2.4 g/dL — ABNORMAL LOW (ref 3.5–5.0)
Alkaline Phosphatase: 119 U/L (ref 38–126)
Anion gap: 7 (ref 5–15)
BUN: 9 mg/dL (ref 6–20)
CO2: 24 mmol/L (ref 22–32)
Calcium: 8 mg/dL — ABNORMAL LOW (ref 8.9–10.3)
Chloride: 100 mmol/L (ref 98–111)
Creatinine, Ser: 0.58 mg/dL — ABNORMAL LOW (ref 0.61–1.24)
GFR calc Af Amer: >60 mL/min (ref >60)
GFR calc non Af Amer: >60 mL/min (ref >60)
GLUCOSE: 116 mg/dL — AB (ref 70–99)
Potassium: 3.5 mmol/L (ref 3.5–5.1)
Sodium: 131 mmol/L — ABNORMAL LOW (ref 135–145)
Total Bilirubin: 10 mg/dL — ABNORMAL HIGH (ref 0.3–1.2)
Total Protein: 5.3 g/dL — ABNORMAL LOW (ref 6.5–8.1)

## 2018-05-05 LAB — CBC
HCT: 29.5 % — ABNORMAL LOW (ref 39.0–52.0)
Hemoglobin: 10 g/dL — ABNORMAL LOW (ref 13.0–17.0)
MCH: 35.6 pg — AB (ref 26.0–34.0)
MCHC: 33.9 g/dL (ref 30.0–36.0)
MCV: 105 fL — AB (ref 80.0–100.0)
NRBC: 0.1 % (ref 0.0–0.2)
Platelets: 26 10*3/uL — CL (ref 150–400)
RBC: 2.81 MIL/uL — AB (ref 4.22–5.81)
RDW: 18.5 % — ABNORMAL HIGH (ref 11.5–15.5)
WBC: 14.3 10*3/uL — ABNORMAL HIGH (ref 4.0–10.5)

## 2018-05-05 LAB — PROTIME-INR
INR: 2.21
Prothrombin Time: 24.2 seconds — ABNORMAL HIGH (ref 11.4–15.2)

## 2018-05-05 LAB — AMMONIA: Ammonia: 21 umol/L (ref 9–35)

## 2018-05-05 LAB — BILIRUBIN, FRACTIONATED(TOT/DIR/INDIR)
BILIRUBIN DIRECT: 4.3 mg/dL — AB (ref 0.0–0.2)
Indirect Bilirubin: 5.8 mg/dL — ABNORMAL HIGH (ref 0.3–0.9)
Total Bilirubin: 10.1 mg/dL — ABNORMAL HIGH (ref 0.3–1.2)

## 2018-05-05 MED ORDER — FOLIC ACID 1 MG PO TABS
1.0000 mg | ORAL_TABLET | Freq: Every day | ORAL | Status: DC
  Administered 2018-05-06 – 2018-05-09 (×4): 1 mg via ORAL
  Filled 2018-05-05 (×4): qty 1

## 2018-05-05 MED ORDER — PANTOPRAZOLE SODIUM 40 MG PO TBEC
40.0000 mg | DELAYED_RELEASE_TABLET | Freq: Every day | ORAL | Status: DC
  Administered 2018-05-05 – 2018-05-09 (×5): 40 mg via ORAL
  Filled 2018-05-05 (×5): qty 1

## 2018-05-05 NOTE — Evaluation (Signed)
Physical Therapy Evaluation Patient Details Name: Jason [name] MRN: [id number] DOB: Oct 15, 1980 Today's Date: 05/05/2018   History of Present Illness  38 y.o. male with medical history significant for alcohol abuse and liver disease, now presenting to emergency department for evaluation of severe abdominal pain with increased abdominal distention, nausea, and nonbloody vomiting. Dx of cirrhosis.   Clinical Impression  Pt ambulated 160' without an assistive device, no loss of balance, HR 125 at rest 141 with ambulation. Distance limited by therapist 2* elevated HR. No further PT indicated as pt is independent with mobility, PT signing off.     Follow Up Recommendations No PT follow up    Equipment Recommendations  None recommended by PT    Recommendations for Other Services       Precautions / Restrictions Precautions Precautions: None Precaution Comments: pt denies h/o falls in past 1 year Restrictions Weight Bearing Restrictions: No      Mobility  Bed Mobility Overal bed mobility: Independent                Transfers Overall transfer level: Independent                  Ambulation/Gait Ambulation/Gait assistance: Independent Gait Distance (Feet): 160 Feet Assistive device: None Gait Pattern/deviations: Wide base of support;Step-through pattern Gait velocity: WNL   General Gait Details: HR 125 at rest, 141 with walking, no dyspnea, no loss of balance; wide BOS likely 2* abdominal distension  Stairs            Wheelchair Mobility    Modified Rankin (Stroke Patients Only)       Balance Overall balance assessment: Independent                                           Pertinent Vitals/Pain Pain Assessment: No/denies pain Pain Score: 0-No pain    Home Living Family/patient expects to be discharged to:: Private residence Living Arrangements: Spouse/significant other     Home Access: Level entry     Home Layout: One  level Home Equipment: None      Prior Function Level of Independence: Independent               Hand Dominance        Extremity/Trunk Assessment   Upper Extremity Assessment Upper Extremity Assessment: Overall WFL for tasks assessed    Lower Extremity Assessment Lower Extremity Assessment: Overall WFL for tasks assessed    Cervical / Trunk Assessment Cervical / Trunk Assessment: Normal  Communication   Communication: Prefers language other than English(spanish)  Cognition Arousal/Alertness: Awake/alert Behavior During Therapy: WFL for tasks assessed/performed Overall Cognitive Status: Within Functional Limits for tasks assessed                                        General Comments      Exercises     Assessment/Plan    PT Assessment Patent does not need any further PT services  PT Problem List         PT Treatment Interventions      PT Goals (Current goals can be found in the Care Plan section)  Acute Rehab PT Goals PT Goal Formulation: All assessment and education complete, DC therapy    Frequency     Barriers  to discharge        Co-evaluation               AM-PAC PT "6 Clicks" Mobility  Outcome Measure Help needed turning from your back to your side while in a flat bed without using bedrails?: None Help needed moving from lying on your back to sitting on the side of a flat bed without using bedrails?: None Help needed moving to and from a bed to a chair (including a wheelchair)?: None Help needed standing up from a chair using your arms (e.g., wheelchair or bedside chair)?: None Help needed to walk in hospital room?: None Help needed climbing 3-5 steps with a railing? : None 6 Click Score: 24    End of Session Equipment Utilized During Treatment: Gait belt Activity Tolerance: Patient tolerated treatment well Patient left: in chair;with call bell/phone within reach;with nursing/sitter in room Nurse Communication:  Mobility status      Time: 4801-6553 PT Time Calculation (min) (ACUTE ONLY): 21 min   Charges:   PT Evaluation $PT Eval Low Complexity: 1 Low         Tamala Ser PT 05/05/2018  Acute Rehabilitation Services Pager [phone] Office [phone]

## 2018-05-05 NOTE — Progress Notes (Signed)
PHARMACIST - PHYSICIAN COMMUNICATION  DR:   Rhona Leavens  CONCERNING: IV to Oral Route Change Policy  RECOMMENDATION: This patient is receiving Protonix, Folic acid by the intravenous route.  Based on criteria approved by the Pharmacy and Therapeutics Committee, the intravenous medication(s) is/are being converted to the equivalent oral dose form(s).   DESCRIPTION: These criteria include:  The patient is eating (either orally or via tube) and/or has been taking other orally administered medications for a least 24 hours  The patient has no evidence of active gastrointestinal bleeding or impaired GI absorption (gastrectomy, short bowel, patient on TNA or NPO).  If you have questions about this conversion, please contact the Pharmacy Department  []   [phone] )  [facility] []   [phone] )  [facility] []   [phone] )  [facility] []   [phone] )  [facility] [x]   [phone] )  [facility]   Emily Filbert Mulga, [facility] 05/05/2018 10:39 AM

## 2018-05-05 NOTE — Progress Notes (Signed)
    Progress Note   Subjective  Chief Complaint: Alcoholic cirrhosis presenting with altered mental status secondary to bacteremia  This morning patient continues to be alert and responsive, via translator, he reports doing well.  He is asking to get up out of bed as well as possibly for a shower and some outside "low-sodium" food.  Denies abdominal pain or any other new complaints today.   Objective   Vital signs in last 24 hours: Temp:  [97.6 F (36.4 C)-98.7 F (37.1 C)] 98.3 F (36.8 C) (01/06 0456) Pulse Rate:  [93-118] 105 (01/06 0456) Resp:  [10-20] 20 (01/06 0456) BP: (135-152)/(75-87) 139/85 (01/06 0456) SpO2:  [98 %-100 %] 98 % (01/06 0456) Weight:  [104.7 kg] 104.7 kg (01/06 0500) Last BM Date: 05/04/18 General:  Jaundiced Hispanic male in NAD Heart:  Regular rate and rhythm; no murmurs Lungs: Respirations even and unlabored, lungs CTA bilaterally Abdomen:  Soft, nontender and nondistended. Normal bowel sounds. Extremities:  Without edema. Neurologic:  Alert and oriented,  grossly normal neurologically, no asterixis Psych:  Cooperative. Normal mood and affect.  Intake/Output from previous day: 01/05 0701 - 01/06 0700 In: 1513.7 [P.O.:1200; I.V.:215; IV Piggyback:98.6] Out: 2200 [Urine:2200] Intake/Output this shift: Total I/O In: -  Out: 275 [Urine:275]  Lab Results: Recent Labs    05/03/18 0308 05/05/18 0530  WBC 11.6* 14.3*  HGB 9.5* 10.0*  HCT 28.0* 29.5*  PLT 21* 26*   BMET Recent Labs    05/03/18 0308 05/04/18 0312 05/05/18 0530  NA 133* 133* 131*  K 3.1* 3.2* 3.5  CL 104 104 100  CO2 20* 21* 24  GLUCOSE 167* 175* 116*  BUN 13 9 9   CREATININE <0.30* 0.52* 0.58*  CALCIUM 8.0* 7.8* 8.0*   LFT Recent Labs    05/05/18 0530  PROT 5.3*  ALBUMIN 2.4*  AST 95*  ALT 80*  ALKPHOS 119  BILITOT 10.0*   PT/INR Recent Labs    05/03/18 0308  LABPROT 28.1*  INR 2.68     Assessment / Plan:   Assessment: 1.  Alcoholic cirrhosis 2.   Altered mental status likely secondary to bacteremia: Improved over the past 72 hours 3.  Hypokalemia 4.  Alcohol abuse: Patient reports no alcohol use in the past 4.5 months 5.  Liver lesions  Plan: 1.  Continue antibiotics per ID 2.  Again patient will need follow-up with our clinic as an outpatient for an EGD for variceal screening, can consider colonoscopy at that time given bacteremia with an intestinal organism 3.  Continue current therapies 4.  Continue low-sodium diet, less than 2 g/day 5.  Ordered repeat PT/INR today as well as fractionated bilirubin 6.  Ordered PT eval and treat, also okayed a shower for the patient with nurse assistance 7.  Please await any further recommendations from Dr. Orvan Falconer later today  Thank you for your kind consultation, we will continue to follow.    LOS: 4 days   Unk Lightning  05/05/2018, 9:29 AM

## 2018-05-05 NOTE — Progress Notes (Signed)
PROGRESS NOTE    Jason [name]  EML:[id number] DOB: March 14, 1981 DOA: 04/30/2018 PCP: Patient, No Pcp Per    Brief Narrative:  38 y.o. male with medical history significant for alcohol abuse and liver disease, now presenting to emergency department for evaluation of severe abdominal pain with increased abdominal distention, nausea, and nonbloody vomiting.  Patient reports a history of alcohol abuse, states that he saw a physician several months ago and was diagnosed with liver disease and prescribed several medications that he does not recall the names of.  He had intended to stop drinking at that time, but unfortunately has continued with last drink this morning.  Over the past 1 to 2 days, he has developed severe generalized abdominal pain with increased abdominal distention, nausea and nonbloody vomiting.  He denies any melena, hematochezia, or hematemesis.  Has not noted any fevers, but has had some chills.  Denies chest pain, cough, shortness of breath, or dysuria.  ED Course: Upon arrival to the ED, patient is found to be afebrile, saturating well on room air, slightly tachycardic, and with stable blood pressure.  Chemistry panel is notable for potassium of 3.1, albumin 2.1, mild elevation in transaminases, and total bilirubin of 9.8.  CBC features a leukocytosis to 24,000, microcytic anemia with hemoglobin 10.8, and platelets 52,000.  Lactic acid was elevated to 2.64.  INR is elevated to 7.73.  Blood cultures were collected, 2 L normal saline administered, morphine given, and paracentesis was performed with removal of approximately 2 L of turbid fluid.  Sample of peritoneal fluid was sent to the lab for cultures and additional analysis, and the patient was treated with cefepime.  Lactic acid normalized, blood pressure remained stable, and the patient will be observed for further evaluation and management of decompensated liver cirrhosis with concern for SBP.  Assessment & Plan:   Principal  Problem:   Decompensated hepatic cirrhosis (HCC) Active Problems:   SBP (spontaneous bacterial peritonitis) (HCC)   Macrocytic anemia   Thrombocytopenia (HCC)   Hypokalemia   Alcohol abuse   Liver lesion, right lobe   Liver failure (HCC)   Bacteremia  1. 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 Initial recommendation to transfer to [facility], however [facility] had decined accepting patient -Pt had been continued on acetylcystine, given vitamin K -Overall poor prognosis, although pt is showing improvement  2. Macrocytic anemia; thrombocytopenia  - Presenting Hgb is 10.8 with MCV 104.6 and platelets 52,000  - He denies any melena, hematochezia, or hematemesis  - Likely secondary to alcohol abuse and liver disease  - Plts improved to 26k - Repeat CBC in AM  3. Hypokalemia  -replaced -repeat bmet in AM  4. Alcohol abuse  - Reports he had intended to quit after learning of his liver problems several months ago, but has continued with last drink the am of 04/30/18. Family questions claim of drinking on day of admit -Patient now stating not drinking over past 4 months - No evidence of withdrawals a this time - Pt is continued on CIWA  5. Liver lesions  - Subcentimeter right lobe liver lesions noted on CT  - Planning for follow-up with MRI recommended. GI recommends follow up MRI in 3 months  6. Metabolic encephalopathy - likely secondary to hepatic encephalopathy given ammonia just under 200 vs active infection from possible SBP vs bacteremia per below - Continued patient  on scheduled lactulose -Ammonia now normalized - Continue abx - Mentation much improved. Appears oriented  7. Rothia mucilaginosa bacteremia - Noted on 2/2 blood cultures -Appreciate ID input, continue above rocphin -2d echo performed, poor visulalization of valves to  r/o endocarditis -WBC improving  DVT prophylaxis: SCD's Code Status: Full Family Communication: pt in room, brother at bedside Disposition Plan: Uncertain at this time  Consultants:   GI  ID  Procedures:     Antimicrobials: Anti-infectives (From admission, onward)   Start     Dose/Rate Route Frequency Ordered Stop   05/01/18 1600  cefTRIAXone (ROCEPHIN) 2 g in sodium chloride 0.9 % 100 mL IVPB     2 g 200 mL/hr over 30 Minutes Intravenous Every 24 hours 05/01/18 0930     05/01/18 0600  ceFEPIme (MAXIPIME) 2 g in sodium chloride 0.9 % 100 mL IVPB  Status:  Discontinued     2 g 200 mL/hr over 30 Minutes Intravenous Every 8 hours 05/01/18 0542 05/01/18 0930   04/30/18 2315  ceFEPIme (MAXIPIME) 2 g in sodium chloride 0.9 % 100 mL IVPB     2 g 200 mL/hr over 30 Minutes Intravenous  Once 04/30/18 2310 05/01/18 0042      Subjective: Wanting food from home  Objective: Vitals:   05/05/18 0456 05/05/18 0500 05/05/18 1420 05/05/18 1421  BP: 139/85   127/70  Pulse: (!) 105  (!) 141 (!) 119  Resp: 20   16  Temp: 98.3 F (36.8 C)   99.2 F (37.3 C)  TempSrc: Oral   Oral  SpO2: 98%   98%  Weight:  104.7 kg    Height:        Intake/Output Summary (Last 24 hours) at 05/05/2018 1449 Last data filed at 05/05/2018 1345 Gross per 24 hour  Intake 1695.98 ml  Output 1525 ml  Net 170.98 ml   Filed Weights   05/03/18 0530 05/04/18 0500 05/05/18 0500  Weight: 100.8 kg 103.4 kg 104.7 kg    Examination: General exam: Awake, laying in bed, in nad Respiratory system: Normal respiratory effort, no wheezing Cardiovascular system: regular rate, s1, s2 Gastrointestinal system: distended,  positive BS Central nervous system: CN2-12 grossly intact, strength intact Extremities: Perfused, no clubbing Skin: Normal skin turgor, no notable skin lesions seen Psychiatry: Mood normal // no visual hallucinations   Data Reviewed: I have personally reviewed following labs and imaging  studies  CBC: Recent Labs  Lab 04/30/18 2042 05/01/18 0445 05/02/18 0312 05/03/18 0308 05/05/18 0530  WBC 24.0* 21.4* 15.9* 11.6* 14.3*  NEUTROABS  --  13.5*  --   --   --   HGB 10.8* 10.0* 9.0* 9.5* 10.0*  HCT 31.6* 29.2* 26.6* 28.0* 29.5*  MCV 104.6* 105.8* 106.0* 106.5* 105.0*  PLT 52* 43* 34* 21* 26*   Basic Metabolic Panel: Recent Labs  Lab 05/01/18 0445 05/02/18 0312 05/03/18 0308 05/04/18 0312 05/05/18 0530  NA 142 135 133* 133* 131*  K 3.5 2.5* 3.1* 3.2* 3.5  CL 109 106 104 104 100  CO2 22 21* 20* 21* 24  GLUCOSE 162* 148* 167* 175* 116*  BUN 21* 15 13 9 9   CREATININE 0.50* 0.52* <0.30* 0.52* 0.58*  CALCIUM 8.1* 7.8* 8.0* 7.8* 8.0*  MG 1.9  --   --   --   --    GFR: Estimated Creatinine Clearance: 143.6 mL/min (A) (by C-G formula based on SCr of 0.58 mg/dL (L)). Liver Function Tests: Recent Labs  Lab 05/01/18 0445 05/02/18 [id number]  05/03/18 0308 05/04/18 0312 05/05/18 0530 05/05/18 1000  AST 103* 86* 115* 104* 95*  --   ALT 97* 83* 88* 87* 80*  --   ALKPHOS 116 77 77 102 119  --   BILITOT 11.5* 10.4* 11.0* 9.5* 10.0* 10.1*  PROT 5.7* 4.7* 4.9* 5.0* 5.3*  --   ALBUMIN 2.4* 2.0* 2.1* 2.1* 2.4*  --    Recent Labs  Lab 04/30/18 2042  LIPASE 71*   Recent Labs  Lab 05/01/18 0445 05/02/18 0312 05/03/18 0525 05/04/18 0312 05/05/18 0530  AMMONIA 190* 68* 68* 55* 21   Coagulation Profile: Recent Labs  Lab 04/30/18 2330 05/01/18 1247 05/02/18 0912 05/03/18 0308 05/05/18 1000  INR 7.73* 2.54 2.74 2.68 2.21   Cardiac Enzymes: No results for input(s): CKTOTAL, CKMB, CKMBINDEX, TROPONINI in the last 168 hours. BNP (last 3 results) No results for input(s): PROBNP in the last 8760 hours. HbA1C: No results for input(s): HGBA1C in the last 72 hours. CBG: No results for input(s): GLUCAP in the last 168 hours. Lipid Profile: No results for input(s): CHOL, HDL, LDLCALC, TRIG, CHOLHDL, LDLDIRECT in the last 72 hours. Thyroid Function Tests: No  results for input(s): TSH, T4TOTAL, FREET4, T3FREE, THYROIDAB in the last 72 hours. Anemia Panel: No results for input(s): VITAMINB12, FOLATE, FERRITIN, TIBC, IRON, RETICCTPCT in the last 72 hours. Sepsis Labs: Recent Labs  Lab 04/30/18 2056 04/30/18 2330 05/01/18 0445  LATICACIDVEN 2.64* 1.7 2.1*    Recent Results (from the past 240 hour(s))  Culture, blood (routine x 2)     Status: Abnormal   Collection Time: 04/30/18  9:06 PM  Result Value Ref Range Status   Specimen Description   Final    BLOOD RIGHT WRIST Performed at [facility], [address], [place], [place] [address]    Special Requests   Final    BOTTLES DRAWN AEROBIC AND ANAEROBIC Blood Culture results may not be optimal due to an excessive volume of blood received in culture bottles Performed at [facility], [address], [place], [place] [address]    Culture  Setup Time   Final    GRAM POSITIVE COCCI IN BOTH AEROBIC AND ANAEROBIC BOTTLES CRITICAL VALUE NOTED.  VALUE IS CONSISTENT WITH PREVIOUSLY REPORTED AND CALLED VALUE. Performed at [facility], [address], [place], [place] [address]    Culture Marianna Fuss (A)  Final   Report Status 05/03/2018 FINAL  Final  Culture, blood (routine x 2)     Status: Abnormal   Collection Time: 04/30/18  9:48 PM  Result Value Ref Range Status   Specimen Description   Final    BLOOD LEFT ANTECUBITAL Performed at [facility], [address], [place], [place] [address]    Special Requests   Final    BOTTLES DRAWN AEROBIC AND ANAEROBIC Blood Culture results may not be optimal due to an excessive volume of blood received in culture bottles Performed at [facility], [address], [place], [place] [address]    Culture  Setup Time   Final    GRAM POSITIVE COCCI IN CLUSTERS IN BOTH AEROBIC AND ANAEROBIC BOTTLES CRITICAL RESULT CALLED TO, READ BACK BY AND VERIFIED WITH: Damaris Hippo  PharmD 14:20 05/01/18 ([email or web address]) Performed at [facility], [address] [place], [place] [address]    Culture Marianna Fuss (A)  Final   Report Status 05/03/2018 FINAL  Final  Blood Culture ID Panel (Reflexed)  Status: None   Collection Time: 04/30/18  9:48 PM  Result Value Ref Range Status   Enterococcus species NOT DETECTED NOT DETECTED Final   Listeria monocytogenes NOT DETECTED NOT DETECTED Final   Staphylococcus species NOT DETECTED NOT DETECTED Final   Staphylococcus aureus (BCID) NOT DETECTED NOT DETECTED Final   Streptococcus species NOT DETECTED NOT DETECTED Final   Streptococcus agalactiae NOT DETECTED NOT DETECTED Final   Streptococcus pneumoniae NOT DETECTED NOT DETECTED Final   Streptococcus pyogenes NOT DETECTED NOT DETECTED Final   Acinetobacter baumannii NOT DETECTED NOT DETECTED Final   Enterobacteriaceae species NOT DETECTED NOT DETECTED Final   Enterobacter cloacae complex NOT DETECTED NOT DETECTED Final   Escherichia coli NOT DETECTED NOT DETECTED Final   Klebsiella oxytoca NOT DETECTED NOT DETECTED Final   Klebsiella pneumoniae NOT DETECTED NOT DETECTED Final   Proteus species NOT DETECTED NOT DETECTED Final   Serratia marcescens NOT DETECTED NOT DETECTED Final   Haemophilus influenzae NOT DETECTED NOT DETECTED Final   Neisseria meningitidis NOT DETECTED NOT DETECTED Final   Pseudomonas aeruginosa NOT DETECTED NOT DETECTED Final   Candida albicans NOT DETECTED NOT DETECTED Final   Candida glabrata NOT DETECTED NOT DETECTED Final   Candida krusei NOT DETECTED NOT DETECTED Final   Candida parapsilosis NOT DETECTED NOT DETECTED Final   Candida tropicalis NOT DETECTED NOT DETECTED Final    Comment: Performed at [facility], [address], [place], [place] [address]  Body fluid culture     Status: None   Collection Time: 05/01/18 12:10 AM  Result Value Ref Range Status   Specimen Description   Final    PERITONEAL CAVITY Performed at [facility], [address], [place], [place] [address]    Special Requests   Final    NONE Performed at [facility], [address] [place], [place] [address]    Gram Stain   Final    FEW WBC PRESENT,BOTH PMN AND MONONUCLEAR NO ORGANISMS SEEN Performed at [facility], [address], [place], [place] [address]    Culture NO GROWTH  Final   Report Status 05/04/2018 FINAL  Final  MRSA PCR Screening     Status: None   Collection Time: 05/01/18 11:52 AM  Result Value Ref Range Status   MRSA by PCR NEGATIVE NEGATIVE Final    Comment:        The GeneXpert MRSA Assay (FDA approved for NASAL specimens only), is one component of a comprehensive MRSA colonization surveillance program. It is not intended to diagnose MRSA infection nor to guide or monitor treatment for MRSA infections. Performed at [facility], [address] [place], [place] [address]   Culture, blood (routine x 2)     Status: None (Preliminary result)   Collection Time: 05/02/18  5:39 PM  Result Value Ref Range Status   Specimen Description BLOOD RIGHT ARM  Final   Special Requests   Final    BOTTLES DRAWN AEROBIC AND ANAEROBIC Blood Culture adequate volume Performed at [facility], [address] [place], [place] [address]    Culture NO GROWTH 3 DAYS  Final   Report Status PENDING  Incomplete  Culture, blood (routine x 2)     Status: None (Preliminary result)   Collection Time: 05/02/18  5:39 PM  Result Value Ref Range Status   Specimen Description BLOOD RIGHT HAND  Final   Special Requests   Final  BOTTLES DRAWN AEROBIC ONLY Blood Culture adequate volume Performed at [facility], [address] [place], [place] [id number]    Culture NO GROWTH 3 DAYS  Final   Report Status PENDING  Incomplete     Radiology Studies: No results found.  Scheduled Meds: . [START ON 05/06/2018] folic acid  1 mg Oral Daily   . furosemide  20 mg Intravenous Daily  . lactulose  20 g Oral TID  . multivitamin with minerals  1 tablet Oral Daily  . pantoprazole  40 mg Oral QAC lunch  . potassium chloride  20 mEq Oral BID  . sodium chloride flush  3 mL Intravenous Q12H  . thiamine  100 mg Oral Daily   Continuous Infusions: . sodium chloride 10 mL/hr at 05/04/18 1700  . cefTRIAXone (ROCEPHIN)  IV Stopped (05/04/18 1653)     LOS: 4 days   Rickey BarbaraStephen Annslee Tercero, MD [facility] Pager On Amion  If 7PM-7AM, please contact night-coverage 05/05/2018, 2:49 PM

## 2018-05-05 NOTE — Progress Notes (Signed)
Taking over care of patient. Agree with previous RN assessment. Denies any needs at this time. Will continue to monitor.  

## 2018-05-06 LAB — CBC WITH DIFFERENTIAL/PLATELET
BAND NEUTROPHILS: 0 %
Basophils Absolute: 0 10*3/uL (ref 0.0–0.1)
Basophils Relative: 0 %
Blasts: 0 %
Eosinophils Absolute: 0.4 10*3/uL (ref 0.0–0.5)
Eosinophils Relative: 2 %
HCT: 28.3 % — ABNORMAL LOW (ref 39.0–52.0)
Hemoglobin: 9.5 g/dL — ABNORMAL LOW (ref 13.0–17.0)
Lymphocytes Relative: 14 %
Lymphs Abs: 2.5 10*3/uL (ref 0.7–4.0)
MCH: 36.1 pg — ABNORMAL HIGH (ref 26.0–34.0)
MCHC: 33.6 g/dL (ref 30.0–36.0)
MCV: 107.6 fL — ABNORMAL HIGH (ref 80.0–100.0)
Metamyelocytes Relative: 0 %
Monocytes Absolute: 3.3 10*3/uL — ABNORMAL HIGH (ref 0.1–1.0)
Monocytes Relative: 18 %
Myelocytes: 0 %
NRBC: 0 /100{WBCs}
Neutro Abs: 12 10*3/uL — ABNORMAL HIGH (ref 1.7–7.7)
Neutrophils Relative %: 66 %
Other: 0 %
PLATELETS: 34 10*3/uL — AB (ref 150–400)
Promyelocytes Relative: 0 %
RBC: 2.63 MIL/uL — ABNORMAL LOW (ref 4.22–5.81)
RDW: 18.7 % — ABNORMAL HIGH (ref 11.5–15.5)
WBC: 18.2 10*3/uL — ABNORMAL HIGH (ref 4.0–10.5)
nRBC: 0.2 % (ref 0.0–0.2)

## 2018-05-06 LAB — COMPREHENSIVE METABOLIC PANEL
ALT: 78 U/L — ABNORMAL HIGH (ref 0–44)
AST: 92 U/L — ABNORMAL HIGH (ref 15–41)
Albumin: 2.4 g/dL — ABNORMAL LOW (ref 3.5–5.0)
Alkaline Phosphatase: 153 U/L — ABNORMAL HIGH (ref 38–126)
Anion gap: 7 (ref 5–15)
BUN: 11 mg/dL (ref 6–20)
CO2: 25 mmol/L (ref 22–32)
Calcium: 8.1 mg/dL — ABNORMAL LOW (ref 8.9–10.3)
Chloride: 99 mmol/L (ref 98–111)
Creatinine, Ser: 0.65 mg/dL (ref 0.61–1.24)
GFR calc Af Amer: >60 mL/min (ref >60)
Glucose, Bld: 117 mg/dL — ABNORMAL HIGH (ref 70–99)
Potassium: 4 mmol/L (ref 3.5–5.1)
Sodium: 131 mmol/L — ABNORMAL LOW (ref 135–145)
Total Bilirubin: 9.2 mg/dL — ABNORMAL HIGH (ref 0.3–1.2)
Total Protein: 5.6 g/dL — ABNORMAL LOW (ref 6.5–8.1)

## 2018-05-06 LAB — AFP TUMOR MARKER: AFP, Serum, Tumor Marker: 3.3 ng/mL (ref 0.0–8.3)

## 2018-05-06 LAB — AMMONIA: AMMONIA: 95 umol/L — AB (ref 9–35)

## 2018-05-06 NOTE — Care Management Note (Signed)
Case Management Note  Patient Details  Name: Jason [name] MRN: [id number] Date of Birth: 06/14/1980  Subjective/Objective: Decompensated hepatic cirrhosis. From home. Has brother who assts with care. Spanish speaking-used Spanish interpreter machine in rm for translation-informed of pcp listing-encouraged [facility] clinics(brother will call for pcp appt), other resources provided.                   Action/Plan:dc plan home.   Expected Discharge Date:  (unknown)               Expected Discharge Plan:  Home/Self Care  In-House Referral:     Discharge planning Services  CM Consult, Indigent Health Clinic  Post Acute Care Choice:    Choice offered to:     DME Arranged:    DME Agency:     HH Arranged:    HH Agency:     Status of Service:  In process, will continue to follow  If discussed at Long Length of Stay Meetings, dates discussed:    Additional Comments:  Lanier Clam, RN 05/06/2018, 12:23 PM

## 2018-05-06 NOTE — Progress Notes (Signed)
PROGRESS NOTE    Jason [name]  WGN:[id number] DOB: 01-May-1980 DOA: 04/30/2018 PCP: Patient, No Pcp Per    Brief Narrative:  38 y.o. male with medical history significant for alcohol abuse and liver disease, now presenting to emergency department for evaluation of severe abdominal pain with increased abdominal distention, nausea, and nonbloody vomiting.  Patient reports a history of alcohol abuse, states that he saw a physician several months ago and was diagnosed with liver disease and prescribed several medications that he does not recall the names of.  He had intended to stop drinking at that time, but unfortunately has continued with last drink this morning.  Over the past 1 to 2 days, he has developed severe generalized abdominal pain with increased abdominal distention, nausea and nonbloody vomiting.  He denies any melena, hematochezia, or hematemesis.  Has not noted any fevers, but has had some chills.  Denies chest pain, cough, shortness of breath, or dysuria.  ED Course: Upon arrival to the ED, patient is found to be afebrile, saturating well on room air, slightly tachycardic, and with stable blood pressure.  Chemistry panel is notable for potassium of 3.1, albumin 2.1, mild elevation in transaminases, and total bilirubin of 9.8.  CBC features a leukocytosis to 24,000, microcytic anemia with hemoglobin 10.8, and platelets 52,000.  Lactic acid was elevated to 2.64.  INR is elevated to 7.73.  Blood cultures were collected, 2 L normal saline administered, morphine given, and paracentesis was performed with removal of approximately 2 L of turbid fluid.  Sample of peritoneal fluid was sent to the lab for cultures and additional analysis, and the patient was treated with cefepime.  Lactic acid normalized, blood pressure remained stable, and the patient will be observed for further evaluation and management of decompensated liver cirrhosis with concern for SBP.  Assessment & Plan:   Principal  Problem:   Decompensated hepatic cirrhosis (HCC) Active Problems:   SBP (spontaneous bacterial peritonitis) (HCC)   Macrocytic anemia   Thrombocytopenia (HCC)   Hypokalemia   Alcohol abuse   Liver lesion, right lobe   Liver failure (HCC)   Bacteremia  1. 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 Initial recommendation to transfer to [facility], however [facility] had decined accepting patient -Pt had been continued on acetylcystine, acetylcystine now stopped, given vitamin K -Overall poor prognosis, although pt is showing improvement. Recommendation for follow up at liver transplant center on discharge  2. Macrocytic anemia; thrombocytopenia  - Presenting Hgb is 10.8 with MCV 104.6 and platelets 52,000  - He denies any melena, hematochezia, or hematemesis  - Likely secondary to alcohol abuse and liver disease  - Plts improved to 26k --recheck CBC in AM  3. Hypokalemia  -replaced -repeat bmet in AM  4. Alcohol abuse  - Reports he had intended to quit after learning of his liver problems several months ago, but has continued with last drink the am of 04/30/18. Family questions claim of drinking on day of admit -Patient now stating not drinking over past 4 months - No evidence of withdrawals a this time - Pt is continued on CIWA. Stable at this time  5. Liver lesions  - Subcentimeter right lobe liver lesions noted on CT  - Planning for follow-up with MRI recommended. GI recommends follow up MRI in 3 months - Stable  6. Metabolic encephalopathy - likely secondary to hepatic encephalopathy  given ammonia just under 200 vs active infection from possible SBP vs bacteremia per below - Continued patient on scheduled lactulose -Ammonia now normalized - Continue abx - Mentation much improved, oriented  7. Rothia mucilaginosa bacteremia - Noted  on 2/2 blood cultures -Appreciate ID input, continue above rocphin -2d echo performed, poor visulalization of valves to r/o endocarditis - WBC improving  DVT prophylaxis: SCD's Code Status: Full Family Communication: pt in room, brother at bedside Disposition Plan: Uncertain at this time  Consultants:   GI  ID  Procedures:     Antimicrobials: Anti-infectives (From admission, onward)   Start     Dose/Rate Route Frequency Ordered Stop   05/01/18 1600  cefTRIAXone (ROCEPHIN) 2 g in sodium chloride 0.9 % 100 mL IVPB     2 g 200 mL/hr over 30 Minutes Intravenous Every 24 hours 05/01/18 0930     05/01/18 0600  ceFEPIme (MAXIPIME) 2 g in sodium chloride 0.9 % 100 mL IVPB  Status:  Discontinued     2 g 200 mL/hr over 30 Minutes Intravenous Every 8 hours 05/01/18 0542 05/01/18 0930   04/30/18 2315  ceFEPIme (MAXIPIME) 2 g in sodium chloride 0.9 % 100 mL IVPB     2 g 200 mL/hr over 30 Minutes Intravenous  Once 04/30/18 2310 05/01/18 0042      Subjective: Wanting to walk in hallway  Objective: Vitals:   05/05/18 1825 05/05/18 2035 05/06/18 0518 05/06/18 1259  BP: 133/82 (!) 154/77 (!) 143/78 128/71  Pulse: (!) 102 (!) 108 (!) 106 (!) 104  Resp: 17 (!) 22 20 17   Temp: 98.4 F (36.9 C) 97.9 F (36.6 C) 98.5 F (36.9 C) 98.8 F (37.1 C)  TempSrc: Oral Oral Oral Oral  SpO2: 100% 100% 100% 99%  Weight:   106.5 kg   Height:        Intake/Output Summary (Last 24 hours) at 05/06/2018 1329 Last data filed at 05/06/2018 1300 Gross per 24 hour  Intake 1664.58 ml  Output 900 ml  Net 764.58 ml   Filed Weights   05/04/18 0500 05/05/18 0500 05/06/18 0518  Weight: 103.4 kg 104.7 kg 106.5 kg    Examination: General exam: Conversant, in no acute distress Respiratory system: normal chest rise, clear, no audible wheezing Cardiovascular system: regular rhythm, s1-s2 Gastrointestinal system: Nondistended, nontender, pos BS Central nervous system: No seizures, no  tremors Extremities: No cyanosis, no joint deformities Skin: No rashes, no pallor, jaundiced Psychiatry: Affect normal // no auditory hallucinations   Data Reviewed: I have personally reviewed following labs and imaging studies  CBC: Recent Labs  Lab 04/30/18 2042 05/01/18 0445 05/02/18 0312 05/03/18 0308 05/05/18 0530  WBC 24.0* 21.4* 15.9* 11.6* 14.3*  NEUTROABS  --  13.5*  --   --   --   HGB 10.8* 10.0* 9.0* 9.5* 10.0*  HCT 31.6* 29.2* 26.6* 28.0* 29.5*  MCV 104.6* 105.8* 106.0* 106.5* 105.0*  PLT 52* 43* 34* 21* 26*   Basic Metabolic Panel: Recent Labs  Lab 05/01/18 0445 05/02/18 0312 05/03/18 0308 05/04/18 0312 05/05/18 0530 05/06/18 0525  NA 142 135 133* 133* 131* 131*  K 3.5 2.5* 3.1* 3.2* 3.5 4.0  CL 109 106 104 104 100 99  CO2 22 21* 20* 21* 24 25  GLUCOSE 162* 148* 167* 175* 116* 117*  BUN 21* 15 13 9 9 11   CREATININE 0.50* 0.52* <0.30* 0.52* 0.58* 0.65  CALCIUM 8.1* 7.8* 8.0* 7.8* 8.0* 8.1*  MG 1.9  --   --   --   --   --  GFR: Estimated Creatinine Clearance: 144.9 mL/min (by C-G formula based on SCr of 0.65 mg/dL). Liver Function Tests: Recent Labs  Lab 05/02/18 0312 05/03/18 0308 05/04/18 0312 05/05/18 0530 05/05/18 1000 05/06/18 0525  AST 86* 115* 104* 95*  --  92*  ALT 83* 88* 87* 80*  --  78*  ALKPHOS 77 77 102 119  --  153*  BILITOT 10.4* 11.0* 9.5* 10.0* 10.1* 9.2*  PROT 4.7* 4.9* 5.0* 5.3*  --  5.6*  ALBUMIN 2.0* 2.1* 2.1* 2.4*  --  2.4*   Recent Labs  Lab 04/30/18 2042  LIPASE 71*   Recent Labs  Lab 05/02/18 0312 05/03/18 0525 05/04/18 0312 05/05/18 0530 05/06/18 0525  AMMONIA 68* 68* 55* 21 95*   Coagulation Profile: Recent Labs  Lab 04/30/18 2330 05/01/18 1247 05/02/18 0912 05/03/18 0308 05/05/18 1000  INR 7.73* 2.54 2.74 2.68 2.21   Cardiac Enzymes: No results for input(s): CKTOTAL, CKMB, CKMBINDEX, TROPONINI in the last 168 hours. BNP (last 3 results) No results for input(s): PROBNP in the last 8760  hours. HbA1C: No results for input(s): HGBA1C in the last 72 hours. CBG: No results for input(s): GLUCAP in the last 168 hours. Lipid Profile: No results for input(s): CHOL, HDL, LDLCALC, TRIG, CHOLHDL, LDLDIRECT in the last 72 hours. Thyroid Function Tests: No results for input(s): TSH, T4TOTAL, FREET4, T3FREE, THYROIDAB in the last 72 hours. Anemia Panel: No results for input(s): VITAMINB12, FOLATE, FERRITIN, TIBC, IRON, RETICCTPCT in the last 72 hours. Sepsis Labs: Recent Labs  Lab 04/30/18 2056 04/30/18 2330 05/01/18 0445  LATICACIDVEN 2.64* 1.7 2.1*    Recent Results (from the past 240 hour(s))  Culture, blood (routine x 2)     Status: Abnormal   Collection Time: 04/30/18  9:06 PM  Result Value Ref Range Status   Specimen Description   Final    BLOOD RIGHT WRIST Performed at [facility], [address] [place], [place] [address]    Special Requests   Final    BOTTLES DRAWN AEROBIC AND ANAEROBIC Blood Culture results may not be optimal due to an excessive volume of blood received in culture bottles Performed at [facility], [address] [place], [place] [address]    Culture  Setup Time   Final    GRAM POSITIVE COCCI IN BOTH AEROBIC AND ANAEROBIC BOTTLES CRITICAL VALUE NOTED.  VALUE IS CONSISTENT WITH PREVIOUSLY REPORTED AND CALLED VALUE. Performed at [facility], [address] [place], [place] [address]    Culture Marianna FussOTHIA MUCILAGINOSA (A)  Final   Report Status 05/03/2018 FINAL  Final  Culture, blood (routine x 2)     Status: Abnormal   Collection Time: 04/30/18  9:48 PM  Result Value Ref Range Status   Specimen Description   Final    BLOOD LEFT ANTECUBITAL Performed at [facility], [address] [place], [place] [address]    Special Requests   Final    BOTTLES DRAWN AEROBIC AND ANAEROBIC Blood Culture results may not be optimal due to an excessive volume of blood received in culture  bottles Performed at [facility], [address] [place], [place] [address]    Culture  Setup Time   Final    GRAM POSITIVE COCCI IN CLUSTERS IN BOTH AEROBIC AND ANAEROBIC BOTTLES CRITICAL RESULT CALLED TO, READ BACK BY AND VERIFIED WITH: Damaris HippoM. Lilliston PharmD 14:20 05/01/18 ([email or web address]) Performed at [facility], [address] [place], [place] [address]  Culture ROTHIA MUCILAGINOSA (A)  Final   Report Status 05/03/2018 FINAL  Final  Blood Culture ID Panel (Reflexed)     Status: None   Collection Time: 04/30/18  9:48 PM  Result Value Ref Range Status   Enterococcus species NOT DETECTED NOT DETECTED Final   Listeria monocytogenes NOT DETECTED NOT DETECTED Final   Staphylococcus species NOT DETECTED NOT DETECTED Final   Staphylococcus aureus (BCID) NOT DETECTED NOT DETECTED Final   Streptococcus species NOT DETECTED NOT DETECTED Final   Streptococcus agalactiae NOT DETECTED NOT DETECTED Final   Streptococcus pneumoniae NOT DETECTED NOT DETECTED Final   Streptococcus pyogenes NOT DETECTED NOT DETECTED Final   Acinetobacter baumannii NOT DETECTED NOT DETECTED Final   Enterobacteriaceae species NOT DETECTED NOT DETECTED Final   Enterobacter cloacae complex NOT DETECTED NOT DETECTED Final   Escherichia coli NOT DETECTED NOT DETECTED Final   Klebsiella oxytoca NOT DETECTED NOT DETECTED Final   Klebsiella pneumoniae NOT DETECTED NOT DETECTED Final   Proteus species NOT DETECTED NOT DETECTED Final   Serratia marcescens NOT DETECTED NOT DETECTED Final   Haemophilus influenzae NOT DETECTED NOT DETECTED Final   Neisseria meningitidis NOT DETECTED NOT DETECTED Final   Pseudomonas aeruginosa NOT DETECTED NOT DETECTED Final   Candida albicans NOT DETECTED NOT DETECTED Final   Candida glabrata NOT DETECTED NOT DETECTED Final   Candida krusei NOT DETECTED NOT DETECTED Final   Candida parapsilosis NOT DETECTED NOT DETECTED Final   Candida tropicalis NOT DETECTED NOT  DETECTED Final    Comment: Performed at [facility], [address] [place], [place] [address]  Body fluid culture     Status: None   Collection Time: 05/01/18 12:10 AM  Result Value Ref Range Status   Specimen Description   Final    PERITONEAL CAVITY Performed at [facility], [address] [place], [place] [address]    Special Requests   Final    NONE Performed at [facility], [address] [place], [place] [address]    Gram Stain   Final    FEW WBC PRESENT,BOTH PMN AND MONONUCLEAR NO ORGANISMS SEEN Performed at [facility], [address], [place], [place] [address]    Culture NO GROWTH  Final   Report Status 05/04/2018 FINAL  Final  MRSA PCR Screening     Status: None   Collection Time: 05/01/18 11:52 AM  Result Value Ref Range Status   MRSA by PCR NEGATIVE NEGATIVE Final    Comment:        The GeneXpert MRSA Assay (FDA approved for NASAL specimens only), is one component of a comprehensive MRSA colonization surveillance program. It is not intended to diagnose MRSA infection nor to guide or monitor treatment for MRSA infections. Performed at [facility], [address] [place], [place] [address]   Culture, blood (routine x 2)     Status: None (Preliminary result)   Collection Time: 05/02/18  5:39 PM  Result Value Ref Range Status   Specimen Description   Final    BLOOD RIGHT ARM Performed at [facility], [address] [place], [place] [address]    Special Requests   Final    BOTTLES DRAWN AEROBIC AND ANAEROBIC Blood Culture adequate volume Performed at [facility], [address], [place], [place] [address]    Culture   Final    NO GROWTH 4 DAYS Performed at [facility], [address] [place], [place] [address]    Report Status PENDING  Incomplete  Culture, blood (routine x 2)     Status: None (Preliminary result)   Collection Time:  05/02/18  5:39 PM  Result Value Ref Range Status   Specimen Description   Final    BLOOD RIGHT HAND Performed at [facility], [address] [place], [place] [address]    Special Requests   Final    BOTTLES DRAWN AEROBIC ONLY Blood Culture adequate volume Performed at [facility], [address] [place], [place] [address]    Culture   Final    NO GROWTH 4 DAYS Performed at [facility], [address] [place], [place] [address]    Report Status PENDING  Incomplete     Radiology Studies: No results found.  Scheduled Meds: . folic acid  1 mg Oral Daily  . furosemide  20 mg Intravenous Daily  . lactulose  20 g Oral TID  . multivitamin with minerals  1 tablet Oral Daily  . pantoprazole  40 mg Oral QAC lunch  . potassium chloride  20 mEq Oral BID  . sodium chloride flush  3 mL Intravenous Q12H  . thiamine  100 mg Oral Daily   Continuous Infusions: . sodium chloride Stopped (05/04/18 1727)  . cefTRIAXone (ROCEPHIN)  IV Stopped (05/05/18 1648)     LOS: 5 days   Rickey BarbaraStephen Dory Verdun, MD [facility] Pager On Amion  If 7PM-7AM, please contact night-coverage 05/06/2018, 1:29 PM

## 2018-05-06 NOTE — Progress Notes (Signed)
    [facility]    Date of Admission:  04/30/2018   Total days of antibiotics 7           ID: Jason [name] is a 38 y.o. male with  Decompensated liver failure- found to have rothia mucilaginosa bacteremia/SBP Principal Problem:   Decompensated hepatic cirrhosis (HCC) Active Problems:   SBP (spontaneous bacterial peritonitis) (HCC)   Macrocytic anemia   Thrombocytopenia (HCC)   Hypokalemia   Alcohol abuse   Liver lesion, right lobe   Liver failure (HCC)   Bacteremia    Subjective: Remains afebrile, thought repeat CBC this afternoon shows worsening leukocytosis at 18K up from 14K   Medications:  . folic acid  1 mg Oral Daily  . furosemide  20 mg Intravenous Daily  . lactulose  20 g Oral TID  . multivitamin with minerals  1 tablet Oral Daily  . pantoprazole  40 mg Oral QAC lunch  . potassium chloride  20 mEq Oral BID  . sodium chloride flush  3 mL Intravenous Q12H  . thiamine  100 mg Oral Daily    Objective: Vital signs in last 24 hours: Temp:  [97.9 F (36.6 C)-98.8 F (37.1 C)] 98.8 F (37.1 C) (01/07 1259) Pulse Rate:  [102-108] 104 (01/07 1259) Resp:  [17-22] 17 (01/07 1259) BP: (128-154)/(71-82) 128/71 (01/07 1259) SpO2:  [99 %-100 %] 99 % (01/07 1259) Weight:  [106.5 kg] 106.5 kg (01/07 0518)    Lab Results Recent Labs    05/05/18 0530 05/06/18 0525 05/06/18 1518  WBC 14.3*  --  18.2*  HGB 10.0*  --  9.5*  HCT 29.5*  --  28.3*  NA 131* 131*  --   K 3.5 4.0  --   CL 100 99  --   CO2 24 25  --   BUN 9 11  --   CREATININE 0.58* 0.65  --    Liver Panel Recent Labs    05/05/18 0530 05/05/18 1000 05/06/18 0525  PROT 5.3*  --  5.6*  ALBUMIN 2.4*  --  2.4*  AST 95*  --  92*  ALT 80*  --  78*  ALKPHOS 119  --  153*  BILITOT 10.0* 10.1* 9.2*  BILIDIR  --  4.3*  --   IBILI  --  5.8*  --     Microbiology: revewied Studies/Results: No results found.   Assessment/Plan: Leukocytosis = concern for worsening WBC in  the setting of being treated for rothia bacteremia. Consider repeat paracentesis if has abdominal pain.   Rothia bacteremia = continue on ceftriaxone 2gm IV daily  Hyperbilirubinemia = improving  Hepatic encephalopathy = titrate lactulose for 3-4 bm   [facility] Cell: [phone] Pager: [phone]  05/06/2018, 6:17 PM

## 2018-05-06 NOTE — Progress Notes (Addendum)
[facility] Attending   I have taken an interval history, reviewed the chart and examined the patient. I agree with the Advanced Practitioner's note, impression and recommendations.   He is so much better than before - WBC trended up as did NH 3 however.  I spent time explaining things using an interpreter - alcoholic cirrhosis, needs to abstain from [facility]  He needs oral diuretic regimen (I ordered) and might be able to head home soon (1-2 days) if does well.  DC will depend upon Abx regimen needed also - might keep him here unless po choice available  Will need SBP prophylaxis regimen after finishes tx of sepsis  Iva Boop, MD, [facility] 05/06/2018 4:47 PM Pager [phone]     Progress Note   Subjective  Chief Complaint: Alcoholic cirrhosis presenting with altered mental status secondary to bacteremia and elevated ammonia level  This morning patient continues to be alert and responsive, via translator he reports doing well with no abdominal pain.  He did get out of bed yesterday and is able to ambulate by himself.  Denies any new complaints or concerns.   Objective   Vital signs in last 24 hours: Temp:  [97.9 F (36.6 C)-99.2 F (37.3 C)] 98.5 F (36.9 C) (01/07 0518) Pulse Rate:  [102-141] 106 (01/07 0518) Resp:  [16-22] 20 (01/07 0518) BP: (127-154)/(70-82) 143/78 (01/07 0518) SpO2:  [98 %-100 %] 100 % (01/07 0518) Weight:  [106.5 kg] 106.5 kg (01/07 0518) Last BM Date: 05/04/18 General:    Jaundiced Hispanic male in NAD Heart:  Regular rate and rhythm; no murmurs Lungs: Respirations even and unlabored, lungs CTA bilaterally Abdomen:  Soft, nontender and nondistended. Normal bowel sounds. Extremities:  Without edema. Neurologic:  Alert and oriented,  grossly normal neurologically. Psych:  Cooperative. Normal mood and affect.  Intake/Output from previous day: 01/06 0701 - 01/07 0700 In: 1304.6 [P.O.:1200; I.V.:4.6; IV  Piggyback:100] Out: 1400 [Urine:1400]  Lab Results: Recent Labs    05/05/18 0530  WBC 14.3*  HGB 10.0*  HCT 29.5*  PLT 26*   BMET Recent Labs    05/04/18 0312 05/05/18 0530 05/06/18 0525  NA 133* 131* 131*  K 3.2* 3.5 4.0  CL 104 100 99  CO2 21* 24 25  GLUCOSE 175* 116* 117*  BUN 9 9 11   CREATININE 0.52* 0.58* 0.65  CALCIUM 7.8* 8.0* 8.1*   LFT Recent Labs    05/05/18 1000 05/06/18 0525  PROT  --  5.6*  ALBUMIN  --  2.4*  AST  --  92*  ALT  --  78*  ALKPHOS  --  153*  BILITOT 10.1* 9.2*  BILIDIR 4.3*  --   IBILI 5.8*  --    PT/INR Recent Labs    05/05/18 1000  LABPROT 24.2*  INR 2.21    Assessment / Plan:   Assessment: 1.  Alcoholic cirrhosis 2.  Altered mental status likely secondary to bacteremia: White count did increase overnight from 11.6-14.3, antibiotics per ID 3.  Hyperkalemia 4.  Alcohol abuse 5.  Liver lesions  Plan:  1.  Continue current therapies including lactulose twice daily.  Per team patient he is having 7 bowel movements today with ammonia did increase overnight, mentation good on exam.  Will hold for now, can consider decreasing the future titrated to 3-4 bowel movements per day. 2.  Patient may continue to ambulate with assistance 3.  Continue other therapies 4.  Again will obtain an AFP and  recommend contrasted MRI in 3 months as well as outpatient EGD 5.  Please await any further recommendations from Dr. Leone Payor later today.  Thank you for your kind consultation.  We will continue to follow.   LOS: 5 days   Unk Lightning  05/06/2018, 10:36 AM

## 2018-05-07 DIAGNOSIS — R197 Diarrhea, unspecified: Secondary | ICD-10-CM

## 2018-05-07 DIAGNOSIS — R188 Other ascites: Secondary | ICD-10-CM

## 2018-05-07 DIAGNOSIS — K7290 Hepatic failure, unspecified without coma: Secondary | ICD-10-CM

## 2018-05-07 DIAGNOSIS — E7229 Other disorders of urea cycle metabolism: Secondary | ICD-10-CM

## 2018-05-07 LAB — COMPREHENSIVE METABOLIC PANEL
ALT: 69 U/L — ABNORMAL HIGH (ref 0–44)
AST: 88 U/L — ABNORMAL HIGH (ref 15–41)
Albumin: 2.3 g/dL — ABNORMAL LOW (ref 3.5–5.0)
Alkaline Phosphatase: 129 U/L — ABNORMAL HIGH (ref 38–126)
Anion gap: 5 (ref 5–15)
BUN: 12 mg/dL (ref 6–20)
CO2: 23 mmol/L (ref 22–32)
Calcium: 7.8 mg/dL — ABNORMAL LOW (ref 8.9–10.3)
Chloride: 101 mmol/L (ref 98–111)
Creatinine, Ser: 0.54 mg/dL — ABNORMAL LOW (ref 0.61–1.24)
GFR calc non Af Amer: >60 mL/min (ref >60)
Glucose, Bld: 110 mg/dL — ABNORMAL HIGH (ref 70–99)
Potassium: 3.9 mmol/L (ref 3.5–5.1)
Sodium: 129 mmol/L — ABNORMAL LOW (ref 135–145)
TOTAL PROTEIN: 5.4 g/dL — AB (ref 6.5–8.1)
Total Bilirubin: 8.5 mg/dL — ABNORMAL HIGH (ref 0.3–1.2)

## 2018-05-07 LAB — CULTURE, BLOOD (ROUTINE X 2)
Culture: NO GROWTH
Culture: NO GROWTH
Special Requests: ADEQUATE
Special Requests: ADEQUATE

## 2018-05-07 LAB — CBC
HCT: 27.7 % — ABNORMAL LOW (ref 39.0–52.0)
Hemoglobin: 9.4 g/dL — ABNORMAL LOW (ref 13.0–17.0)
MCH: 36.3 pg — ABNORMAL HIGH (ref 26.0–34.0)
MCHC: 33.9 g/dL (ref 30.0–36.0)
MCV: 106.9 fL — ABNORMAL HIGH (ref 80.0–100.0)
PLATELETS: 36 10*3/uL — AB (ref 150–400)
RBC: 2.59 MIL/uL — ABNORMAL LOW (ref 4.22–5.81)
RDW: 19 % — ABNORMAL HIGH (ref 11.5–15.5)
WBC: 15.5 10*3/uL — ABNORMAL HIGH (ref 4.0–10.5)
nRBC: 0 % (ref 0.0–0.2)

## 2018-05-07 LAB — AMMONIA: Ammonia: 51 umol/L — ABNORMAL HIGH (ref 9–35)

## 2018-05-07 MED ORDER — ALBUMIN HUMAN 25 % IV SOLN
25.0000 g | Freq: Once | INTRAVENOUS | Status: DC
  Filled 2018-05-07: qty 100

## 2018-05-07 MED ORDER — ALBUMIN HUMAN 25 % IV SOLN: 25.0000 g | Freq: Once | INTRAVENOUS | Status: DC

## 2018-05-07 MED ORDER — LACTULOSE 10 GM/15ML PO SOLN
20.0000 g | Freq: Two times a day (BID) | ORAL | Status: DC
  Administered 2018-05-07: 20 g via ORAL
  Filled 2018-05-07: qty 30

## 2018-05-07 NOTE — Progress Notes (Addendum)
   Agree with Ms. Jason [name]'s management.  Reducing lactulose Repeat paracentesis Will plan an outpatient diuretic regimen after we see paracentesis results Will need to know what oral Abx he can take (from ID) to complte 2 weeks rx  Jason Booparl E. Calise Dunckel, MD, [facility]   [facility] Progress Note  CC:  ETOH cirrhosis  Subjective:  Feels good.  Reports at least 9 BM's/liquid stools in the past 12 hours or so, however.  Communication via interpreter service.  No abdominal pain.  Objective:  Vital signs in last 24 hours: Temp:  [98.4 F (36.9 C)-98.6 F (37 C)] 98.5 F (36.9 C) (01/08 0539) Pulse Rate:  [98-114] 98 (01/08 0539) Resp:  [12-19] 19 (01/08 0539) BP: (127-136)/(72-75) 127/73 (01/08 0539) SpO2:  [99 %-100 %] 100 % (01/08 0539) Weight:  [108.4 kg] 108.4 kg (01/08 0539) Last BM Date: 05/06/18 General:  Alert, Well-developed, in NAD Heart:  Regular rate and rhythm; no murmurs Pulm:  No M/R/G. Abdomen:  Soft, seems somewhat distended with ascites fluid.  BS present.  Non-tender. Extremities:  Without edema. Neurologic:  Alert and oriented x 4;  grossly normal neurologically. Psych:  Alert and cooperative. Normal mood and affect.  Intake/Output from previous day: 01/07 0701 - 01/08 0700 In: 1400 [P.O.:1300; IV Piggyback:100] Out: 350 [Urine:350]  Lab Results: Recent Labs    05/05/18 0530 05/06/18 1518 05/07/18 0533  WBC 14.3* 18.2* 15.5*  HGB 10.0* 9.5* 9.4*  HCT 29.5* 28.3* 27.7*  PLT 26* 34* 36*   BMET Recent Labs    05/05/18 0530 05/06/18 0525 05/07/18 0533  NA 131* 131* 129*  K 3.5 4.0 3.9  CL 100 99 101  CO2 24 25 23   GLUCOSE 116* 117* 110*  BUN 9 11 12   CREATININE 0.58* 0.65 0.54*  CALCIUM 8.0* 8.1* 7.8*   LFT Recent Labs    05/05/18 1000  05/07/18 0533  PROT  --    < > 5.4*  ALBUMIN  --    < > 2.3*  AST  --    < > 88*  ALT  --    < > 69*  ALKPHOS  --    < > 129*  BILITOT 10.1*   < > 8.5*  BILIDIR 4.3*  --   --   IBILI 5.8*  --    --    < > = values in this interval not displayed.   PT/INR Recent Labs    05/05/18 1000  LABPROT 24.2*  INR 2.21   Assessment / Plan: 1.  Alcoholic cirrhosis:  Last ETOH one month or less ago. 2.  Altered mental status likely secondary to bacteremia: White count did improve somewhat overnight, antibiotics per ID-->ceftriaxone IV while inpatient.  Had elevated ammonia, likely HE as well and is on lactulose 20 gm TID. 3.  Alcohol abuse 4.  Liver lesions:  AFP normal.   6.  Ascites:  Started on lasix 20 mg IV once daily on 1/7.  Sodium down to 129 today.  Plan: 1.  Will decrease lactulose to 20 gm BID. 2.  MRI in 3 months as well as outpatient EGD. 3.  Will order LVP with cell count, culture, and cytology. 4.  Will consider adjusting diuretics on 1/9 pending results of paracentesis.   LOS: 6 days   Princella PellegriniJessica D. [name]  05/07/2018, 1:49 PM

## 2018-05-07 NOTE — Progress Notes (Signed)
PROGRESS NOTE    Jason [name]  ZOX:[id number] DOB: 1980-05-31 DOA: 04/30/2018 PCP: Patient, No Pcp Per    Brief Narrative:  38 y.o. male with medical history significant for alcohol abuse and liver disease, now presenting to emergency department for evaluation of severe abdominal pain with increased abdominal distention, nausea, and nonbloody vomiting.  Patient reports a history of alcohol abuse, states that he saw a physician several months ago and was diagnosed with liver disease and prescribed several medications that he does not recall the names of.  He had intended to stop drinking at that time, but unfortunately has continued with last drink this morning.  Over the past 1 to 2 days, he has developed severe generalized abdominal pain with increased abdominal distention, nausea and nonbloody vomiting.  He denies any melena, hematochezia, or hematemesis.  Has not noted any fevers, but has had some chills.  Denies chest pain, cough, shortness of breath, or dysuria.  ED Course: Upon arrival to the ED, patient is found to be afebrile, saturating well on room air, slightly tachycardic, and with stable blood pressure.  Chemistry panel is notable for potassium of 3.1, albumin 2.1, mild elevation in transaminases, and total bilirubin of 9.8.  CBC features a leukocytosis to 24,000, microcytic anemia with hemoglobin 10.8, and platelets 52,000.  Lactic acid was elevated to 2.64.  INR is elevated to 7.73.  Blood cultures were collected, 2 L normal saline administered, morphine given, and paracentesis was performed with removal of approximately 2 L of turbid fluid.  Sample of peritoneal fluid was sent to the lab for cultures and additional analysis, and the patient was treated with cefepime.  Lactic acid normalized, blood pressure remained stable, and the patient will be observed for further evaluation and management of decompensated liver cirrhosis with concern for SBP.  Assessment & Plan:   Principal  Problem:   Decompensated hepatic cirrhosis (HCC) Active Problems:   SBP (spontaneous bacterial peritonitis) (HCC)   Macrocytic anemia   Thrombocytopenia (HCC)   Hypokalemia   Alcohol abuse   Liver lesion, right lobe   Liver failure (HCC)   Bacteremia   Ascites  1)Rothia mucilaginosa bacteremia - Noted on 2/2 blood cultures -Appreciate ID input, continue above rocphin -2d echo performed, poor visulalization of valves to r/o endocarditis =  continue on ceftriaxone while he is hospitalized. Can switch to an oral agent to finish out 14d course of therapy. Thereafter, would give cipro 750mg  weekly for SBP proph------ afebrile and WBC trending down  2)Decompensated cirrhosis- -Hx ETOH abuse,  -Ammonia and bilirubin trending down -Had been continued on empiric rocephin for SBP coverage -Consulted GI. Initial recommendation to [facility], however [facility] had decined accepting patient -Pt had been continued on acetylcystine, acetylcystine now stopped, given vitamin K -Overall poor prognosis, although pt is showing improvement. Recommendation for follow up at liver transplant center on discharge  3 Macrocytic anemia; thrombocytopenia  - Presenting Hgb is 10.8 with MCV 104.6 and platelets 52,000  - He denies any melena, hematochezia, or hematemesis  - Likely secondary to alcohol abuse and liver disease  - Continue to monitor platelets    3. Hypokalemia  -replaced -repeat bmet in AM  4. Alcohol abuse  - Reports he had intended to quit after learning of his liver problems several months ago, but has continued with last drink the am of 04/30/18. Family questions claim of drinking on day of admit -Patient now stating not drinking over past 4 months - No evidence of withdrawals  a this time - Pt is continued on CIWA. Stable at this time  5. Liver lesions  - Subcentimeter right lobe liver lesions noted on CT  - Planning for follow-up with MRI recommended. GI recommends follow up MRI in  3 months - Stable  6. Metabolic encephalopathy - likely secondary to hepatic encephalopathy given ammonia just under 200 vs active infection from possible SBP vs bacteremia per below - Continued patient on scheduled lactulose -Ammonia now normalized - Continue abx - Mentation much improved, oriented  DVT prophylaxis: SCD's Code Status: Full Family Communication: pt in room, brother at bedside Disposition Plan: Uncertain at this time  Consultants:   GI  ID  Procedures:     Antimicrobials: Anti-infectives (From admission, onward)   Start     Dose/Rate Route Frequency Ordered Stop   05/01/18 1600  cefTRIAXone (ROCEPHIN) 2 g in sodium chloride 0.9 % 100 mL IVPB     2 g 200 mL/hr over 30 Minutes Intravenous Every 24 hours 05/01/18 0930     05/01/18 0600  ceFEPIme (MAXIPIME) 2 g in sodium chloride 0.9 % 100 mL IVPB  Status:  Discontinued     2 g 200 mL/hr over 30 Minutes Intravenous Every 8 hours 05/01/18 0542 05/01/18 0930   04/30/18 2315  ceFEPIme (MAXIPIME) 2 g in sodium chloride 0.9 % 100 mL IVPB     2 g 200 mL/hr over 30 Minutes Intravenous  Once 04/30/18 2310 05/01/18 0042      Subjective: Spanish interpreter used no new concerns, no fevers,  Objective: Vitals:   05/06/18 1834 05/06/18 2138 05/07/18 0539 05/07/18 1534  BP: 136/72 128/75 127/73 135/71  Pulse: (!) 102 (!) 114 98 93  Resp: 12 18 19 18   Temp: 98.4 F (36.9 C) 98.6 F (37 C) 98.5 F (36.9 C) 98.7 F (37.1 C)  TempSrc: Oral Oral Oral Oral  SpO2: 99% 99% 100% 98%  Weight:   108.4 kg   Height:        Intake/Output Summary (Last 24 hours) at 05/07/2018 1831 Last data filed at 05/07/2018 1646 Gross per 24 hour  Intake 340 ml  Output -  Net 340 ml   Filed Weights   05/05/18 0500 05/06/18 0518 05/07/18 0539  Weight: 104.7 kg 106.5 kg 108.4 kg    Examination: General exam: Conversant, in no acute distress Respiratory system: normal chest rise, clear, no audible wheezing Cardiovascular system:  regular rhythm, s1-s2 Gastrointestinal system:  ?? Mild ascites, no significant tenderness Central nervous system: No seizures, no tremors Extremities: No cyanosis, no joint deformities Skin: No rashes, no pallor,   Psychiatry: Affect normal // no auditory hallucinations   CBC: Recent Labs  Lab 05/01/18 0445 05/02/18 0312 05/03/18 0308 05/05/18 0530 05/06/18 1518 05/07/18 0533  WBC 21.4* 15.9* 11.6* 14.3* 18.2* 15.5*  NEUTROABS 13.5*  --   --   --  12.0*  --   HGB 10.0* 9.0* 9.5* 10.0* 9.5* 9.4*  HCT 29.2* 26.6* 28.0* 29.5* 28.3* 27.7*  MCV 105.8* 106.0* 106.5* 105.0* 107.6* 106.9*  PLT 43* 34* 21* 26* 34* 36*   Basic Metabolic Panel: Recent Labs  Lab 05/01/18 0445  05/03/18 0308 05/04/18 0312 05/05/18 0530 05/06/18 0525 05/07/18 0533  NA 142   < > 133* 133* 131* 131* 129*  K 3.5   < > 3.1* 3.2* 3.5 4.0 3.9  CL 109   < > 104 104 100 99 101  CO2 22   < > 20* 21* 24 25 23  GLUCOSE 162*   < > 167* 175* 116* 117* 110*  BUN 21*   < > 13 9 9 11 12   CREATININE 0.50*   < > <0.30* 0.52* 0.58* 0.65 0.54*  CALCIUM 8.1*   < > 8.0* 7.8* 8.0* 8.1* 7.8*  MG 1.9  --   --   --   --   --   --    < > = values in this interval not displayed.   GFR: Estimated Creatinine Clearance: 146.4 mL/min (A) (by C-G formula based on SCr of 0.54 mg/dL (L)). Liver Function Tests: Recent Labs  Lab 05/03/18 0308 05/04/18 0312 05/05/18 0530 05/05/18 1000 05/06/18 0525 05/07/18 0533  AST 115* 104* 95*  --  92* 88*  ALT 88* 87* 80*  --  78* 69*  ALKPHOS 77 102 119  --  153* 129*  BILITOT 11.0* 9.5* 10.0* 10.1* 9.2* 8.5*  PROT 4.9* 5.0* 5.3*  --  5.6* 5.4*  ALBUMIN 2.1* 2.1* 2.4*  --  2.4* 2.3*   Recent Labs  Lab 04/30/18 2042  LIPASE 71*   Recent Labs  Lab 05/03/18 0525 05/04/18 0312 05/05/18 0530 05/06/18 0525 05/07/18 0533  AMMONIA 68* 55* 21 95* 51*   Coagulation Profile: Recent Labs  Lab 04/30/18 2330 05/01/18 1247 05/02/18 0912 05/03/18 0308 05/05/18 1000  INR 7.73*  2.54 2.74 2.68 2.21   Cardiac Enzymes: No results for input(s): CKTOTAL, CKMB, CKMBINDEX, TROPONINI in the last 168 hours. BNP (last 3 results) No results for input(s): PROBNP in the last 8760 hours. HbA1C: No results for input(s): HGBA1C in the last 72 hours. CBG: No results for input(s): GLUCAP in the last 168 hours. Lipid Profile: No results for input(s): CHOL, HDL, LDLCALC, TRIG, CHOLHDL, LDLDIRECT in the last 72 hours. Thyroid Function Tests: No results for input(s): TSH, T4TOTAL, FREET4, T3FREE, THYROIDAB in the last 72 hours. Anemia Panel: No results for input(s): VITAMINB12, FOLATE, FERRITIN, TIBC, IRON, RETICCTPCT in the last 72 hours. Sepsis Labs: Recent Labs  Lab 04/30/18 2056 04/30/18 2330 05/01/18 0445  LATICACIDVEN 2.64* 1.7 2.1*    Recent Results (from the past 240 hour(s))  Culture, blood (routine x 2)     Status: Abnormal   Collection Time: 04/30/18  9:06 PM  Result Value Ref Range Status   Specimen Description   Final    BLOOD RIGHT WRIST Performed at [facility], [address] [place], [place] [address]    Special Requests   Final    BOTTLES DRAWN AEROBIC AND ANAEROBIC Blood Culture results may not be optimal due to an excessive volume of blood received in culture bottles Performed at [facility], [address] [place], [place] [address]    Culture  Setup Time   Final    GRAM POSITIVE COCCI IN BOTH AEROBIC AND ANAEROBIC BOTTLES CRITICAL VALUE NOTED.  VALUE IS CONSISTENT WITH PREVIOUSLY REPORTED AND CALLED VALUE. Performed at [facility], [address] [place], [place] [address]    Culture Marianna FussOTHIA MUCILAGINOSA (A)  Final   Report Status 05/03/2018 FINAL  Final  Culture, blood (routine x 2)     Status: Abnormal   Collection Time: 04/30/18  9:48 PM  Result Value Ref Range Status   Specimen Description   Final    BLOOD LEFT ANTECUBITAL Performed at [facility], [address] [place], [place] [address]    Special Requests   Final    BOTTLES DRAWN AEROBIC AND ANAEROBIC Blood  Culture results may not be optimal due to an excessive volume of blood received in culture bottles Performed at [facility], [address] [place], [place] [address]    Culture  Setup Time   Final    GRAM POSITIVE COCCI IN CLUSTERS IN BOTH AEROBIC AND ANAEROBIC BOTTLES CRITICAL RESULT CALLED TO, READ BACK BY AND VERIFIED WITH: Damaris HippoM. Lilliston PharmD 14:20 05/01/18 ([email or web address]) Performed at [facility], [address] [place], [place] [address]    Culture Marianna FussOTHIA MUCILAGINOSA (A)  Final   Report Status 05/03/2018 FINAL  Final  Blood Culture ID Panel (Reflexed)     Status: None   Collection Time: 04/30/18  9:48 PM  Result Value Ref Range Status   Enterococcus species NOT DETECTED NOT DETECTED Final   Listeria monocytogenes NOT DETECTED NOT DETECTED Final   Staphylococcus species NOT DETECTED NOT DETECTED Final   Staphylococcus aureus (BCID) NOT DETECTED NOT DETECTED Final   Streptococcus species NOT DETECTED NOT DETECTED Final   Streptococcus agalactiae NOT DETECTED NOT DETECTED Final   Streptococcus pneumoniae NOT DETECTED NOT DETECTED Final   Streptococcus pyogenes NOT DETECTED NOT DETECTED Final   Acinetobacter baumannii NOT DETECTED NOT DETECTED Final   Enterobacteriaceae species NOT DETECTED NOT DETECTED Final   Enterobacter cloacae complex NOT DETECTED NOT DETECTED Final   Escherichia coli NOT DETECTED NOT DETECTED Final   Klebsiella oxytoca NOT DETECTED NOT DETECTED Final   Klebsiella pneumoniae NOT DETECTED NOT DETECTED Final   Proteus species NOT DETECTED NOT DETECTED Final   Serratia marcescens NOT DETECTED NOT DETECTED Final   Haemophilus influenzae NOT DETECTED NOT DETECTED Final   Neisseria meningitidis NOT DETECTED NOT DETECTED Final   Pseudomonas aeruginosa NOT DETECTED NOT DETECTED Final   Candida albicans NOT DETECTED NOT DETECTED Final   Candida  glabrata NOT DETECTED NOT DETECTED Final   Candida krusei NOT DETECTED NOT DETECTED Final   Candida parapsilosis NOT DETECTED NOT DETECTED Final   Candida tropicalis NOT DETECTED NOT DETECTED Final    Comment: Performed at [facility], [address] [place], [place] [address]  Body fluid culture     Status: None   Collection Time: 05/01/18 12:10 AM  Result Value Ref Range Status   Specimen Description   Final    PERITONEAL CAVITY Performed at [facility], [address] [place], [place] [address]    Special Requests   Final    NONE Performed at [facility], [address] [place], [place] [address]    Gram Stain   Final    FEW WBC PRESENT,BOTH PMN AND MONONUCLEAR NO ORGANISMS SEEN Performed at [facility], [address] [place], [place] [address]    Culture NO GROWTH  Final   Report Status 05/04/2018 FINAL  Final  MRSA PCR Screening     Status: None   Collection Time: 05/01/18 11:52 AM  Result Value Ref Range Status   MRSA by PCR NEGATIVE NEGATIVE Final    Comment:        The GeneXpert MRSA Assay (FDA approved for NASAL specimens only), is one component of a comprehensive MRSA colonization surveillance program. It is not intended to diagnose MRSA infection nor to guide or monitor treatment for MRSA infections. Performed at [facility], [address] [place], [place] [address]   Culture, blood (routine x 2)     Status: None   Collection Time: 05/02/18  5:39 PM  Result Value  Ref Range Status   Specimen Description   Final    BLOOD RIGHT ARM Performed at [facility], [address], [place], [place] [address]    Special Requests   Final    BOTTLES DRAWN AEROBIC AND ANAEROBIC Blood Culture adequate volume Performed at [facility], [address], [place], [place] [address]    Culture   Final    NO GROWTH 5 DAYS Performed at [facility],  [address] [place], [place] [address]    Report Status 05/07/2018 FINAL  Final  Culture, blood (routine x 2)     Status: None   Collection Time: 05/02/18  5:39 PM  Result Value Ref Range Status   Specimen Description   Final    BLOOD RIGHT HAND Performed at [facility], [address], [place], [place] [address]    Special Requests   Final    BOTTLES DRAWN AEROBIC ONLY Blood Culture adequate volume Performed at [facility], [address], [place], [place] [address]    Culture   Final    NO GROWTH 5 DAYS Performed at [facility], [address] [place], [place] [address]    Report Status 05/07/2018 FINAL  Final     Radiology Studies: No results found.  Scheduled Meds: . folic acid  1 mg Oral Daily  . furosemide  20 mg Intravenous Daily  . lactulose  20 g Oral BID  . multivitamin with minerals  1 tablet Oral Daily  . pantoprazole  40 mg Oral QAC lunch  . potassium chloride  20 mEq Oral BID  . sodium chloride flush  3 mL Intravenous Q12H  . thiamine  100 mg Oral Daily   Continuous Infusions: . sodium chloride 250 mL (05/07/18 1621)  . albumin human    . cefTRIAXone (ROCEPHIN)  IV 2 g (05/07/18 1622)     LOS: 6 days   Shon Hale, MD [facility] Pager On Amion  If 7PM-7AM, please contact night-coverage 05/07/2018, 6:31 PM

## 2018-05-07 NOTE — Progress Notes (Signed)
[facility]    Date of Admission:  04/30/2018   Total days of antibiotics 8          ID: Jason BougieJose Guadalope is a 38 y.o. male with   Principal Problem:   Decompensated hepatic cirrhosis (HCC) Active Problems:   SBP (spontaneous bacterial peritonitis) (HCC)   Macrocytic anemia   Thrombocytopenia (HCC)   Hypokalemia   Alcohol abuse   Liver lesion, right lobe   Liver failure (HCC)   Bacteremia    Subjective: Afebrile. Sitting up eating lunch, denies fevers ,chills, nightsweats, no abdominal discomfort. Has had 2 loose stools since this morning.   WBC trended down to 15K today  Medications:  . folic acid  1 mg Oral Daily  . furosemide  20 mg Intravenous Daily  . lactulose  20 g Oral TID  . multivitamin with minerals  1 tablet Oral Daily  . pantoprazole  40 mg Oral QAC lunch  . potassium chloride  20 mEq Oral BID  . sodium chloride flush  3 mL Intravenous Q12H  . thiamine  100 mg Oral Daily    Objective: Vital signs in last 24 hours: Temp:  [98.4 F (36.9 C)-98.6 F (37 C)] 98.5 F (36.9 C) (01/08 0539) Pulse Rate:  [98-114] 98 (01/08 0539) Resp:  [12-19] 19 (01/08 0539) BP: (127-136)/(72-75) 127/73 (01/08 0539) SpO2:  [99 %-100 %] 100 % (01/08 0539) Weight:  [108.4 kg] 108.4 kg (01/08 0539) Physical Exam  Constitutional: He is oriented to person, place He appears well-developed and well-nourished. No distress.  HENT: marked scleral icterus Mouth/Throat: Oropharynx is clear and moist. No oropharyngeal exudate.  Cardiovascular: Normal rate, regular rhythm and normal heart sounds. Exam reveals no gallop and no friction rub.  No murmur heard.  Pulmonary/Chest: Effort normal and breath sounds normal. No respiratory distress. He has no wheezes.  Abdominal: Soft. Bowel sounds are normal. He exhibits no distension. There is no tenderness. Protuberant abdomen Lymphadenopathy:  He has no cervical adenopathy.  Neurological: He is alert and oriented  to person, place, and time.  Skin: Skin is warm and dry. No rash noted. No erythema. Scar from skin graft to left lower leg Psychiatric: He has a normal mood and affect. His behavior is normal.    Lab Results Recent Labs    05/06/18 0525 05/06/18 1518 05/07/18 0533  WBC  --  18.2* 15.5*  HGB  --  9.5* 9.4*  HCT  --  28.3* 27.7*  NA 131*  --  129*  K 4.0  --  3.9  CL 99  --  101  CO2 25  --  23  BUN 11  --  12  CREATININE 0.65  --  0.54*   Liver Panel Recent Labs    05/05/18 1000 05/06/18 0525 05/07/18 0533  PROT  --  5.6* 5.4*  ALBUMIN  --  2.4* 2.3*  AST  --  92* 88*  ALT  --  78* 69*  ALKPHOS  --  153* 129*  BILITOT 10.1* 9.2* 8.5*  BILIDIR 4.3*  --   --   IBILI 5.8*  --   --    Sedimentation Rate No results for input(s): ESRSEDRATE in the last 72 hours. C-Reactive Protein No results for input(s): CRP in the last 72 hours.  Microbiology: 1/1 blood cx rothia 1/3 blood cx ngtd Studies/Results: No results found.   Assessment/Plan: Rothia bacteremia = continue on ceftriaxone while he is hospitalized. Can switch to an oral  agent to finish out 14d course of therapy. Thereafter, would give cipro 750mg  weekly for SBP proph  Leukocytosis = improving without changing his regimen. No need to repeat infectious work up at this time since he looks good  Cirrhosis management = changed to oral diuretics per gi team. Agree with other providers to recommend stop ETOH use.  hyperammonia = improved today  Hyperbilirubinemia = continues to improve, now at 8.5  [facility] for Infect[facility] Diseases Cell: [phone] Pager: [phone]  05/07/2018, 3:00 PM

## 2018-05-08 ENCOUNTER — Inpatient Hospital Stay (HOSPITAL_COMMUNITY): Payer: Self-pay

## 2018-05-08 LAB — COMPREHENSIVE METABOLIC PANEL
ALT: 66 U/L — ABNORMAL HIGH (ref 0–44)
AST: 92 U/L — ABNORMAL HIGH (ref 15–41)
Albumin: 2.3 g/dL — ABNORMAL LOW (ref 3.5–5.0)
Alkaline Phosphatase: 132 U/L — ABNORMAL HIGH (ref 38–126)
Anion gap: 8 (ref 5–15)
BUN: 11 mg/dL (ref 6–20)
CO2: 22 mmol/L (ref 22–32)
Calcium: 7.9 mg/dL — ABNORMAL LOW (ref 8.9–10.3)
Chloride: 102 mmol/L (ref 98–111)
Creatinine, Ser: 0.53 mg/dL — ABNORMAL LOW (ref 0.61–1.24)
GFR calc Af Amer: >60 mL/min (ref >60)
GFR calc non Af Amer: >60 mL/min (ref >60)
Glucose, Bld: 121 mg/dL — ABNORMAL HIGH (ref 70–99)
Potassium: 4.1 mmol/L (ref 3.5–5.1)
Sodium: 132 mmol/L — ABNORMAL LOW (ref 135–145)
Total Bilirubin: 8.9 mg/dL — ABNORMAL HIGH (ref 0.3–1.2)
Total Protein: 5.5 g/dL — ABNORMAL LOW (ref 6.5–8.1)

## 2018-05-08 LAB — BODY FLUID CELL COUNT WITH DIFFERENTIAL
Eos, Fluid: 0 %
LYMPHS FL: 32 %
Monocyte-Macrophage-Serous Fluid: 64 % (ref 50–90)
Neutrophil Count, Fluid: 4 % (ref 0–25)
Total Nucleated Cell Count, Fluid: 257 cu mm (ref 0–1000)

## 2018-05-08 LAB — GRAM STAIN

## 2018-05-08 MED ORDER — FUROSEMIDE 40 MG PO TABS
40.0000 mg | ORAL_TABLET | Freq: Every day | ORAL | Status: DC
  Administered 2018-05-09: 40 mg via ORAL
  Filled 2018-05-08: qty 1

## 2018-05-08 MED ORDER — RISAQUAD PO CAPS
1.0000 | ORAL_CAPSULE | Freq: Every day | ORAL | Status: DC
  Administered 2018-05-08 – 2018-05-09 (×2): 1 via ORAL
  Filled 2018-05-08 (×2): qty 1

## 2018-05-08 MED ORDER — LIDOCAINE HCL 1 % IJ SOLN
INTRAMUSCULAR | Status: AC
  Filled 2018-05-08: qty 20

## 2018-05-08 MED ORDER — LACTULOSE 10 GM/15ML PO SOLN
20.0000 g | Freq: Every day | ORAL | Status: DC
  Administered 2018-05-08 – 2018-05-09 (×2): 20 g via ORAL
  Filled 2018-05-08 (×2): qty 30

## 2018-05-08 MED ORDER — SPIRONOLACTONE 25 MG PO TABS
100.0000 mg | ORAL_TABLET | Freq: Every day | ORAL | Status: DC
  Administered 2018-05-08 – 2018-05-09 (×2): 100 mg via ORAL
  Filled 2018-05-08 (×2): qty 4

## 2018-05-08 NOTE — Progress Notes (Addendum)
[facility]    Date of Admission:  04/30/2018   Total days of antibiotics 9   ID: Massie BougieJose Guadalope is a 38 y.o. male with  Principal Problem:   Decompensated hepatic cirrhosis (HCC) Active Problems:   SBP (spontaneous bacterial peritonitis) (HCC)   Macrocytic anemia   Thrombocytopenia (HCC)   Hypokalemia   Alcohol abuse   Liver lesion, right lobe   Liver failure (HCC)   Bacteremia   Ascites    Subjective: Afebrile. Underwent large volume LP of 2.7L without difficulty.  Cell count pending  Medications:  . acidophilus  1 capsule Oral Daily  . folic acid  1 mg Oral Daily  . furosemide  20 mg Intravenous Daily  . lactulose  20 g Oral Daily  . lidocaine      . multivitamin with minerals  1 tablet Oral Daily  . pantoprazole  40 mg Oral QAC lunch  . potassium chloride  20 mEq Oral BID  . sodium chloride flush  3 mL Intravenous Q12H  . thiamine  100 mg Oral Daily    Objective: Vital signs in last 24 hours: Temp:  [98.3 F (36.8 C)-98.7 F (37.1 C)] 98.3 F (36.8 C) (01/09 0422) Pulse Rate:  [89-108] 89 (01/09 0422) Resp:  [14-18] 14 (01/09 0422) BP: (102-135)/(60-73) 119/60 (01/09 1042) SpO2:  [98 %-100 %] 100 % (01/09 0422) Weight:  [109.1 kg] 109.1 kg (01/09 0422) Physical Exam  Constitutional: He is oriented to person, place, and time. He appears well-developed and well-nourished. No distress.  HENT:  Mouth/Throat: Oropharynx is clear and moist. No oropharyngeal exudate.  Cardiovascular: Normal rate, regular rhythm and normal heart sounds. Exam reveals no gallop and no friction rub.  No murmur heard.  Pulmonary/Chest: Effort normal and breath sounds normal. No respiratory distress. He has no wheezes.  Abdominal: protuberant, but softer.Bowel sounds are normal. He exhibits no distension. There is no tenderness.  Neurological: He is alert and oriented to person, place, and time. +asterisix Skin: Skin is warm and dry. No rash noted. No  erythema.  Psychiatric: He has a normal mood and affect. His behavior is normal.   Lab Results Recent Labs    05/06/18 1518 05/07/18 0533 05/08/18 0552  WBC 18.2* 15.5*  --   HGB 9.5* 9.4*  --   HCT 28.3* 27.7*  --   NA  --  129* 132*  K  --  3.9 4.1  CL  --  101 102  CO2  --  23 22  BUN  --  12 11  CREATININE  --  0.54* 0.53*   Liver Panel Recent Labs    05/07/18 0533 05/08/18 0552  PROT 5.4* 5.5*  ALBUMIN 2.3* 2.3*  AST 88* 92*  ALT 69* 66*  ALKPHOS 129* 132*  BILITOT 8.5* 8.9*    Microbiology: reviewed   Assessment/Plan: Rothia bacteremia = continue on ceftriaxone while hospitalized. Would give a dose prior to discharge (even if it earlier than scheduled) then can do the remaining with oral cephalexin 500mg  QID - x 4 days ( rothia often FQ and TMP/SMX resistant)  SBP prophylaxis = follow up on current cell count and culture. Would then start weekly 750mg  cipro dosing   Decompensated cirrhosis = diuretics appear to still be IV. Would check with GI/hepatology if they think lasix 40 PO/spirono 100 PO would be adequate combination then would not have to give potassium supplementation   [facility] Cell: [phone]  Pager: [phone]  05/08/2018, 11:17 AM

## 2018-05-08 NOTE — Progress Notes (Addendum)
      Gastroenterology Progress Note  CC:  ETOH cirrhosis   Subjective:  S/p paracentesis with 2.7 Liters removed, cell count negative for SBP.  Says that he had about 5-6 BM's since last night.  Lactulose was decreased to BID yesterday on 1/8, but looks like someone decreased to once daily starting today.  No new complaints.  Communication performed via interpreter service.  Objective:  Vital signs in last 24 hours: Temp:  [98.3 F (36.8 C)-98.7 F (37.1 C)] 98.3 F (36.8 C) (01/09 0422) Pulse Rate:  [89-108] 89 (01/09 0422) Resp:  [14-18] 14 (01/09 0422) BP: (102-135)/(60-73) 119/60 (01/09 1042) SpO2:  [98 %-100 %] 100 % (01/09 0422) Weight:  [109.1 kg] 109.1 kg (01/09 0422) Last BM Date: 05/07/18 General:  Alert, Well-developed, in NAD; scleral icterus noted. Heart:  Regular rate and rhythm; no murmurs Pulm:  CTAB.  No increased WOB. Abdomen:  Soft, non-distended.  BS present.  Non-tender. Extremities:  Without edema. Neurologic:  Alert and oriented x 4;  grossly normal neurologically. Psych:  Alert and cooperative. Normal mood and affect.  Intake/Output from previous day: 01/08 0701 - 01/09 0700 In: 802.4 [P.O.:660; I.V.:42.4; IV Piggyback:100] Out: 850 [Urine:850]  Lab Results: Recent Labs    05/06/18 1518 05/07/18 0533  WBC 18.2* 15.5*  HGB 9.5* 9.4*  HCT 28.3* 27.7*  PLT 34* 36*   BMET Recent Labs    05/06/18 0525 05/07/18 0533 05/08/18 0552  NA 131* 129* 132*  K 4.0 3.9 4.1  CL 99 101 102  CO2 25 23 22   GLUCOSE 117* 110* 121*  BUN 11 12 11   CREATININE 0.65 0.54* 0.53*  CALCIUM 8.1* 7.8* 7.9*   LFT Recent Labs    05/08/18 0552  PROT 5.5*  ALBUMIN 2.3*  AST 92*  ALT 66*  ALKPHOS 132*  BILITOT 8.9*   Assessment / Plan: 1. Alcoholic cirrhosis:  Last ETOH one month or less ago. 2. Altered mental status likely secondary to bacteremia:  Abx per ID-->ceftriaxone IV while inpatient then switch to PO cephalexin upon discharge.  Had  elevated ammonia, likely HE as well.  Lactulose was decreased to BID yesterday on 1/8, but looks like someone decreased to once daily starting today. 3. Alcohol abuse 4. Liver lesions:  AFP normal.   6.  Ascites:  Started on lasix 20 mg IV once daily on 1/7.  Sodium 132 today.  -Will D/C IV lasix and put him on PO lasix 40 mg daily and aldactone 100 mg daily.  Discontinued K+ supplements as well.  Will recheck CMP in AM. -Continue lactulose.  ? Once or twice daily. -Abx regimen per ID. -Likely can be discharged on 1/10. -MRI in 3 months and should consider outpatient EGD.   LOS: 7 days   Princella Pellegrini. Zehr  05/08/2018, 11:42 AM  Agree with Ms. Rise Mu management. Would do bid lactulose Will need f/u Dr. Barron Alvine set up Iva Boop, MD, [facility]

## 2018-05-08 NOTE — Procedures (Signed)
PROCEDURE SUMMARY:  Successful image-guided paracentesis from the right lower abdomen.  Yielded 2.7 liters of clear gold fluid.  No immediate complications.  Patient tolerated well.   Specimen was sent for labs.  Gordy Councilman Masae Lukacs PA-C 05/08/2018 10:31 AM

## 2018-05-08 NOTE — Progress Notes (Signed)
PROGRESS NOTE    Jason [name]  BTD:[id number] DOB: 04/13/1981 DOA: 04/30/2018 PCP: Patient, No Pcp Per    Brief Narrative:  38 y.o. male with medical history significant for alcohol abuse and liver disease, now presenting to emergency department for evaluation of severe abdominal pain with increased abdominal distention, nausea, and nonbloody vomiting.  Patient reports a history of alcohol abuse, states that he saw a physician several months ago and was diagnosed with liver disease and prescribed several medications that he does not recall the names of.  He had intended to stop drinking at that time, but unfortunately has continued with last drink this morning.  Over the past 1 to 2 days, he has developed severe generalized abdominal pain with increased abdominal distention, nausea and nonbloody vomiting.  He denies any melena, hematochezia, or hematemesis.  Has not noted any fevers, but has had some chills.  Denies chest pain, cough, shortness of breath, or dysuria.  ED Course: Upon arrival to the ED, patient is found to be afebrile, saturating well on room air, slightly tachycardic, and with stable blood pressure.  Chemistry panel is notable for potassium of 3.1, albumin 2.1, mild elevation in transaminases, and total bilirubin of 9.8.  CBC features a leukocytosis to 24,000, microcytic anemia with hemoglobin 10.8, and platelets 52,000.  Lactic acid was elevated to 2.64.  INR is elevated to 7.73.  Blood cultures were collected, 2 L normal saline administered, morphine given, and paracentesis was performed with removal of approximately 2 L of turbid fluid.  Sample of peritoneal fluid was sent to the lab for cultures and additional analysis, and the patient was treated with cefepime.  Lactic acid normalized, blood pressure remained stable, and the patient will be observed for further evaluation and management of decompensated liver cirrhosis with concern for SBP.  Assessment & Plan:   Principal  Problem:   Decompensated hepatic cirrhosis (HCC) Active Problems:   SBP (spontaneous bacterial peritonitis) (HCC)   Macrocytic anemia   Thrombocytopenia (HCC)   Hypokalemia   Alcohol abuse   Liver lesion, right lobe   Liver failure (HCC)   Bacteremia   Ascites  1)Rothia mucilaginosa bacteremia - Noted on 2/2 blood cultures -Appreciate ID input, continue above rocephin while inpatient, per ID may change to p.o. Keflex 500 4 times daily to complete 14 day course upon discharge, Thereafter, would give cipro 750mg  weekly for SBP proph- -2d echo performed, poor visulalization of valves to r/o endocarditis =    2)Decompensated cirrhosis- -Hx ETOH abuse,  -Ammonia and bilirubin trending down -Had been continued on empiric rocephin for SBP coverage -Consulted GI. Initial recommendation to transfer to [facility], however [facility] had decined accepting patient -Pt had been continued on acetylcystine, acetylcystine now stopped, given vitamin K -Overall poor prognosis, although pt is showing improvement. Recommendation for follow up at liver transplant center on discharge--- last EtOH intake about 4 weeks ago, GI recommends twice daily lactulose dosing, Lasix 40 mg p.o., Aldactone 100 mg daily--- may need potassium supplementation, status post paracentesis on 05/08/2018 with removal of 2.7 L, initial studies negative for SBP, outpatient abdominal MRI in 3 months along with outpatient EGD  3 Macrocytic anemia; thrombocytopenia  - Presenting Hgb is 10.8 with MCV 104.6 and platelets 52,000  - He denies any melena, hematochezia, or hematemesis  - Likely secondary to alcohol abuse and liver disease  - Continue to monitor platelets    3.   Disposition--- possible discharge home on 05/09/2018 if continues to improve  4. Alcohol  abuse  - Reports he had intended to quit after learning of his liver problems several months ago, but has continued with last drink the am of 04/30/18. Family questions claim of drinking  on day of admit -No EtOH use in about 4 weeks, - No evidence of withdrawals a this time - Pt is continued on CIWA. Stable at this time,   5. Liver lesions  - Subcentimeter right lobe liver lesions noted on CT  - Planning for follow-up with MRI recommended. GI recommends follow up MRI in 3 months - Stable  6. Metabolic encephalopathy - likely secondary to hepatic encephalopathy given ammonia just under 200 vs active infection from possible SBP vs bacteremia per below - Continued patient on scheduled lactulose -Ammonia now normalized - Continue abx - Mentation much improved, oriented-----GI recommends continue lactulose twice daily  DVT prophylaxis: SCD's Code Status: Full Family Communication: pt in room,  Disposition Plan: 05/09/2018 possibly  Consultants:   GI  ID  Procedures:   Paracentesis with removal of 2.7 L on 05/08/2018  Antimicrobials: Anti-infectives (From admission, onward)   Start     Dose/Rate Route Frequency Ordered Stop   05/01/18 1600  cefTRIAXone (ROCEPHIN) 2 g in sodium chloride 0.9 % 100 mL IVPB     2 g 200 mL/hr over 30 Minutes Intravenous Every 24 hours 05/01/18 0930     05/01/18 0600  ceFEPIme (MAXIPIME) 2 g in sodium chloride 0.9 % 100 mL IVPB  Status:  Discontinued     2 g 200 mL/hr over 30 Minutes Intravenous Every 8 hours 05/01/18 0542 05/01/18 0930   04/30/18 2315  ceFEPIme (MAXIPIME) 2 g in sodium chloride 0.9 % 100 mL IVPB     2 g 200 mL/hr over 30 Minutes Intravenous  Once 04/30/18 2310 05/01/18 0042      Subjective: Spanish interpreter used no new concerns, no fevers, patient with more than 7 BMs in the last 12 hours  Objective: Vitals:   05/08/18 1031 05/08/18 1037 05/08/18 1042 05/08/18 1320  BP: 102/66 120/62 119/60 135/69  Pulse:    86  Resp:    18  Temp:    (!) 97.4 F (36.3 C)  TempSrc:    Oral  SpO2:    100%  Weight:      Height:        Intake/Output Summary (Last 24 hours) at 05/08/2018 1804 Last data filed at 05/08/2018  0654 Gross per 24 hour  Intake 682.42 ml  Output 850 ml  Net -167.58 ml   Filed Weights   05/06/18 0518 05/07/18 0539 05/08/18 0422  Weight: 106.5 kg 108.4 kg 109.1 kg    Examination: General exam: Conversant, in no acute distress Respiratory system: normal chest rise, clear, no audible wheezing Cardiovascular system: regular rhythm, s1-s2 Gastrointestinal system: Soft, no significant distention after paracentesis on 05/08/2018, no significant tenderness, bowel sounds present Central nervous system: No seizures, no tremors Extremities: No cyanosis, no joint deformities Skin: No rashes, no pallor,   Psychiatry: Affect normal // no auditory hallucinations   CBC: Recent Labs  Lab 05/02/18 0312 05/03/18 0308 05/05/18 0530 05/06/18 1518 05/07/18 0533  WBC 15.9* 11.6* 14.3* 18.2* 15.5*  NEUTROABS  --   --   --  12.0*  --   HGB 9.0* 9.5* 10.0* 9.5* 9.4*  HCT 26.6* 28.0* 29.5* 28.3* 27.7*  MCV 106.0* 106.5* 105.0* 107.6* 106.9*  PLT 34* 21* 26* 34* 36*   Basic Metabolic Panel: Recent Labs  Lab 05/04/18 0312 05/05/18 0530  05/06/18 0525 05/07/18 0533 05/08/18 0552  NA 133* 131* 131* 129* 132*  K 3.2* 3.5 4.0 3.9 4.1  CL 104 100 99 101 102  CO2 21* 24 25 23 22   GLUCOSE 175* 116* 117* 110* 121*  BUN 9 9 11 12 11   CREATININE 0.52* 0.58* 0.65 0.54* 0.53*  CALCIUM 7.8* 8.0* 8.1* 7.8* 7.9*   GFR: Estimated Creatinine Clearance: 146.7 mL/min (A) (by C-G formula based on SCr of 0.53 mg/dL (L)). Liver Function Tests: Recent Labs  Lab 05/04/18 0312 05/05/18 0530 05/05/18 1000 05/06/18 0525 05/07/18 0533 05/08/18 0552  AST 104* 95*  --  92* 88* 92*  ALT 87* 80*  --  78* 69* 66*  ALKPHOS 102 119  --  153* 129* 132*  BILITOT 9.5* 10.0* 10.1* 9.2* 8.5* 8.9*  PROT 5.0* 5.3*  --  5.6* 5.4* 5.5*  ALBUMIN 2.1* 2.4*  --  2.4* 2.3* 2.3*   No results for input(s): LIPASE, AMYLASE in the last 168 hours. Recent Labs  Lab 05/03/18 0525 05/04/18 0312 05/05/18 0530  05/06/18 0525 05/07/18 0533  AMMONIA 68* 55* 21 95* 51*   Coagulation Profile: Recent Labs  Lab 05/02/18 0912 05/03/18 0308 05/05/18 1000  INR 2.74 2.68 2.21   Cardiac Enzymes: No results for input(s): CKTOTAL, CKMB, CKMBINDEX, TROPONINI in the last 168 hours. BNP (last 3 results) No results for input(s): PROBNP in the last 8760 hours. HbA1C: No results for input(s): HGBA1C in the last 72 hours. CBG: No results for input(s): GLUCAP in the last 168 hours. Lipid Profile: No results for input(s): CHOL, HDL, LDLCALC, TRIG, CHOLHDL, LDLDIRECT in the last 72 hours. Thyroid Function Tests: No results for input(s): TSH, T4TOTAL, FREET4, T3FREE, THYROIDAB in the last 72 hours. Anemia Panel: No results for input(s): VITAMINB12, FOLATE, FERRITIN, TIBC, IRON, RETICCTPCT in the last 72 hours. Sepsis Labs: No results for input(s): PROCALCITON, LATICACIDVEN in the last 168 hours.  Recent Results (from the past 240 hour(s))  Culture, blood (routine x 2)     Status: Abnormal   Collection Time: 04/30/18  9:06 PM  Result Value Ref Range Status   Specimen Description   Final    BLOOD RIGHT WRIST Performed at [facility], [address], [place], [place] [address]    Special Requests   Final    BOTTLES DRAWN AEROBIC AND ANAEROBIC Blood Culture results may not be optimal due to an excessive volume of blood received in culture bottles Performed at [facility], [address], [place], [place] [address]    Culture  Setup Time   Final    GRAM POSITIVE COCCI IN BOTH AEROBIC AND ANAEROBIC BOTTLES CRITICAL VALUE NOTED.  VALUE IS CONSISTENT WITH PREVIOUSLY REPORTED AND CALLED VALUE. Performed at [facility], [address] [place], [place] [address]    Culture Marianna Fuss (A)  Final   Report Status 05/03/2018 FINAL  Final  Culture, blood (routine x 2)     Status: Abnormal   Collection Time: 04/30/18  9:48 PM  Result Value Ref Range Status    Specimen Description   Final    BLOOD LEFT ANTECUBITAL Performed at [facility], [address] [place], [place] [address]    Special Requests   Final    BOTTLES DRAWN AEROBIC AND ANAEROBIC Blood Culture results may not be optimal due to an excessive volume of blood received in culture bottles Performed at [facility], [address] [place], [place]  [id number]    Culture  Setup Time   Final    GRAM POSITIVE COCCI IN CLUSTERS IN BOTH AEROBIC AND ANAEROBIC BOTTLES CRITICAL RESULT CALLED TO, READ BACK BY AND VERIFIED WITH: Damaris HippoM. Lilliston PharmD 14:20 05/01/18 ([email or web address]) Performed at [facility], [address] [place], [place] [address]    Culture Marianna FussOTHIA MUCILAGINOSA (A)  Final   Report Status 05/03/2018 FINAL  Final  Blood Culture ID Panel (Reflexed)     Status: None   Collection Time: 04/30/18  9:48 PM  Result Value Ref Range Status   Enterococcus species NOT DETECTED NOT DETECTED Final   Listeria monocytogenes NOT DETECTED NOT DETECTED Final   Staphylococcus species NOT DETECTED NOT DETECTED Final   Staphylococcus aureus (BCID) NOT DETECTED NOT DETECTED Final   Streptococcus species NOT DETECTED NOT DETECTED Final   Streptococcus agalactiae NOT DETECTED NOT DETECTED Final   Streptococcus pneumoniae NOT DETECTED NOT DETECTED Final   Streptococcus pyogenes NOT DETECTED NOT DETECTED Final   Acinetobacter baumannii NOT DETECTED NOT DETECTED Final   Enterobacteriaceae species NOT DETECTED NOT DETECTED Final   Enterobacter cloacae complex NOT DETECTED NOT DETECTED Final   Escherichia coli NOT DETECTED NOT DETECTED Final   Klebsiella oxytoca NOT DETECTED NOT DETECTED Final   Klebsiella pneumoniae NOT DETECTED NOT DETECTED Final   Proteus species NOT DETECTED NOT DETECTED Final   Serratia marcescens NOT DETECTED NOT DETECTED Final   Haemophilus influenzae NOT DETECTED NOT DETECTED Final   Neisseria meningitidis NOT DETECTED NOT DETECTED  Final   Pseudomonas aeruginosa NOT DETECTED NOT DETECTED Final   Candida albicans NOT DETECTED NOT DETECTED Final   Candida glabrata NOT DETECTED NOT DETECTED Final   Candida krusei NOT DETECTED NOT DETECTED Final   Candida parapsilosis NOT DETECTED NOT DETECTED Final   Candida tropicalis NOT DETECTED NOT DETECTED Final    Comment: Performed at [facility], [address] [place], [place] [address]  Body fluid culture     Status: None   Collection Time: 05/01/18 12:10 AM  Result Value Ref Range Status   Specimen Description   Final    PERITONEAL CAVITY Performed at [facility], [address] [place], [place] [address]    Special Requests   Final    NONE Performed at [facility], [address] [place], [place] [address]    Gram Stain   Final    FEW WBC PRESENT,BOTH PMN AND MONONUCLEAR NO ORGANISMS SEEN Performed at [facility], [address] [place], [place] [address]    Culture NO GROWTH  Final   Report Status 05/04/2018 FINAL  Final  MRSA PCR Screening     Status: None   Collection Time: 05/01/18 11:52 AM  Result Value Ref Range Status   MRSA by PCR NEGATIVE NEGATIVE Final    Comment:        The GeneXpert MRSA Assay (FDA approved for NASAL specimens only), is one component of a comprehensive MRSA colonization surveillance program. It is not intended to diagnose MRSA infection nor to guide or monitor treatment for MRSA infections. Performed at [facility], [address] [place], [place] [address]   Culture, blood (routine x 2)     Status: None   Collection Time: 05/02/18  5:39 PM  Result Value Ref Range Status   Specimen Description   Final    BLOOD RIGHT ARM Performed at [facility], [address] [place], [place] [address]  Special Requests   Final    BOTTLES DRAWN AEROBIC AND ANAEROBIC Blood Culture adequate volume Performed at [facility], [address], [place], [place] [address]    Culture   Final    NO GROWTH 5 DAYS Performed at [facility], [address] [place], [place] [address]    Report Status 05/07/2018 FINAL  Final  Culture, blood (routine x 2)     Status: None   Collection Time: 05/02/18  5:39 PM  Result Value Ref Range Status   Specimen Description   Final    BLOOD RIGHT HAND Performed at [facility], [address], [place], [place] [address]    Special Requests   Final    BOTTLES DRAWN AEROBIC ONLY Blood Culture adequate volume Performed at [facility], [address] [place], [place] [address]    Culture   Final    NO GROWTH 5 DAYS Performed at [facility], [address], [place], [place] [address]    Report Status 05/07/2018 FINAL  Final  Gram stain     Status: None   Collection Time: 05/08/18 11:01 AM  Result Value Ref Range Status   Specimen Description FLUID  Final   Special Requests NONE  Final   Gram Stain   Final    CYTOSPIN SMEAR WBC PRESENT, PREDOMINANTLY MONONUCLEAR NO ORGANISMS SEEN Performed at [facility], [address] [place], [place] [address]    Report Status 05/08/2018 FINAL  Final     Radiology Studies: US Paracentesis  Result Date: 05/08/2018 INDICATION: Patient with history of decompensated alcoholic cirrhosis, abdominal distension and recurrent ascites. Request is made for diagnostic and therapeutic paracentesis. EXAM: ULTRASOUND GUIDED DIAGNOSTIC AND THERAPEUTIC PARACENTESIS MEDICATIONS: 10 mL of 1% lidocaine COMPLICATIONS: None immediate. PROCEDURE: Informed written consent was obtained from the patient after a discussion of the risks, benefits and alternatives to treatment. A timeout was performed prior to the initiation of the procedure. Initial ultrasound scanning demonstrates a moderate amount of ascites within the right lower abdominal quadrant. The right lower abdomen was prepped and draped  in the usual sterile fashion. 1% lidocaine was used for local anesthesia. Following this, a 19 gauge, 10-cm, Yueh catheter was introduced. An ultrasound image was saved for documentation purposes. The paracentesis was performed. The catheter was removed and a dressing was applied. The patient tolerated the procedure well without immediate post procedural complication. FINDINGS: A total of approximately 2.7 L of clear gold fluid was removed. Samples were sent to the laboratory as requested by the clinical team. IMPRESSION: Successful ultrasound-guided paracentesis yielding 2.7 L of peritoneal fluid. Read by: Elwin Mocha, PA-C Electronically Signed   By: Simonne Come M.D.   On: 05/08/2018 10:49    Scheduled Meds: . acidophilus  1 capsule Oral Daily  . folic acid  1 mg Oral Daily  . [START ON 05/09/2018] furosemide  40 mg Oral Daily  . lactulose  20 g Oral Daily  . multivitamin with minerals  1 tablet Oral Daily  . pantoprazole  40 mg Oral QAC lunch  . sodium chloride flush  3 mL Intravenous Q12H  . spironolactone  100 mg Oral Daily  . thiamine  100 mg Oral Daily   Continuous Infusions: . sodium chloride Stopped (05/07/18 2105)  . albumin human    . cefTRIAXone (ROCEPHIN)  IV 2 g (05/08/18 1628)     LOS: 7 days   Shon Hale, MD [facility] Pager On Amion  If 7PM-7AM, please contact night-coverage 05/08/2018, 6:04 PM

## 2018-05-09 LAB — COMPREHENSIVE METABOLIC PANEL
ALT: 60 U/L — ABNORMAL HIGH (ref 0–44)
AST: 87 U/L — ABNORMAL HIGH (ref 15–41)
Albumin: 2.1 g/dL — ABNORMAL LOW (ref 3.5–5.0)
Alkaline Phosphatase: 109 U/L (ref 38–126)
Anion gap: 6 (ref 5–15)
BUN: 10 mg/dL (ref 6–20)
CO2: 22 mmol/L (ref 22–32)
Calcium: 7.9 mg/dL — ABNORMAL LOW (ref 8.9–10.3)
Chloride: 104 mmol/L (ref 98–111)
Creatinine, Ser: 0.48 mg/dL — ABNORMAL LOW (ref 0.61–1.24)
GFR calc non Af Amer: >60 mL/min (ref >60)
Glucose, Bld: 93 mg/dL (ref 70–99)
Potassium: 4 mmol/L (ref 3.5–5.1)
Sodium: 132 mmol/L — ABNORMAL LOW (ref 135–145)
Total Bilirubin: 8.8 mg/dL — ABNORMAL HIGH (ref 0.3–1.2)
Total Protein: 4.9 g/dL — ABNORMAL LOW (ref 6.5–8.1)

## 2018-05-09 LAB — CBC
HCT: 27.5 % — ABNORMAL LOW (ref 39.0–52.0)
Hemoglobin: 9.2 g/dL — ABNORMAL LOW (ref 13.0–17.0)
MCH: 36.1 pg — AB (ref 26.0–34.0)
MCHC: 33.5 g/dL (ref 30.0–36.0)
MCV: 107.8 fL — AB (ref 80.0–100.0)
NRBC: 0 % (ref 0.0–0.2)
Platelets: 31 10*3/uL — ABNORMAL LOW (ref 150–400)
RBC: 2.55 MIL/uL — AB (ref 4.22–5.81)
RDW: 18.8 % — ABNORMAL HIGH (ref 11.5–15.5)
WBC: 10.1 10*3/uL (ref 4.0–10.5)

## 2018-05-09 MED ORDER — PANTOPRAZOLE SODIUM 40 MG PO TBEC: 40.0000 mg | DELAYED_RELEASE_TABLET | Freq: Every day | ORAL | 0 refills | Status: DC

## 2018-05-09 MED ORDER — CIPROFLOXACIN HCL 500 MG PO TABS: 750.0000 mg | ORAL_TABLET | ORAL | Status: DC

## 2018-05-09 MED ORDER — CEPHALEXIN 500 MG PO CAPS: 500.0000 mg | ORAL_CAPSULE | Freq: Four times a day (QID) | ORAL | 0 refills | Status: AC

## 2018-05-09 MED ORDER — CIPROFLOXACIN HCL 750 MG PO TABS: ORAL_TABLET | ORAL | 1 refills | Status: DC

## 2018-05-09 MED ORDER — LACTULOSE 10 GM/15ML PO SOLN: 20.0000 g | Freq: Two times a day (BID) | ORAL | 2 refills | Status: DC

## 2018-05-09 MED ORDER — SODIUM CHLORIDE 0.9 % IV SOLN
2.0000 g | INTRAVENOUS | Status: AC
  Administered 2018-05-09: 2 g via INTRAVENOUS
  Filled 2018-05-09: qty 2

## 2018-05-09 MED ORDER — ONDANSETRON HCL 4 MG PO TABS: 4.0000 mg | ORAL_TABLET | Freq: Four times a day (QID) | ORAL | 0 refills | Status: DC | PRN

## 2018-05-09 MED ORDER — ADULT MULTIVITAMIN W/MINERALS CH: 1.0000 | ORAL_TABLET | Freq: Every day | ORAL | 2 refills | Status: DC

## 2018-05-09 MED ORDER — LACTULOSE 10 GM/15ML PO SOLN: 20.0000 g | Freq: Two times a day (BID) | ORAL | Status: DC

## 2018-05-09 MED ORDER — THIAMINE HCL 100 MG PO TABS: 100.0000 mg | ORAL_TABLET | Freq: Every day | ORAL | 3 refills | Status: DC

## 2018-05-09 MED ORDER — CEPHALEXIN 500 MG PO CAPS: 500.0000 mg | ORAL_CAPSULE | Freq: Four times a day (QID) | ORAL | Status: DC

## 2018-05-09 MED ORDER — SPIRONOLACTONE 100 MG PO TABS: 100.0000 mg | ORAL_TABLET | Freq: Every day | ORAL | 2 refills | Status: DC

## 2018-05-09 MED ORDER — FUROSEMIDE 40 MG PO TABS: 40.0000 mg | ORAL_TABLET | Freq: Every day | ORAL | 2 refills | Status: DC

## 2018-05-09 MED ORDER — RISAQUAD PO CAPS: 1.0000 | ORAL_CAPSULE | Freq: Every day | ORAL | 3 refills | Status: DC

## 2018-05-09 MED ORDER — FOLIC ACID 1 MG PO TABS: 1.0000 mg | ORAL_TABLET | Freq: Every day | ORAL | 5 refills | Status: DC

## 2018-05-09 NOTE — Discharge Instructions (Signed)
1)Follow- up with gastroenterologist Dr. Barron Alvine in [place] in one week (05/16/2018) 2) avoid alcohol 3)Complete abstinence from alcohol advised 4)Take medications including antibiotics exactly as prescribed 5)Goal is to have 3-4 bowel movements every day--- constipation will cause you to get confused and disoriented 6)Avoid excessive amount of Tylenol/acetaminophen 7)Follow-up with primary care physician within a week for recheck of your CBC/complete blood count and CMP/complete metabolic profile 8) very low-salt diet advised

## 2018-05-09 NOTE — Care Management Note (Signed)
Case Management Note  Patient Details  Name: Kaison Pas MRN: [id number] Date of Birth: 1980-07-05  Subjective/Objective: Spanish speaking-utilized the Spanish translator machine- Patient informed to call for pcp. Will use the [facility] program for meds-awaiting brother to come to hospital to explain the [facility] Program(1x use/12 calendar months/select pharmacies/$3 co pay)-form in shadow chart.                  Action/Plan:dc home.   Expected Discharge Date:  (unknown)               Expected Discharge Plan:  Home/Self Care  In-House Referral:     Discharge planning Services  CM Consult, Indigent [facility] Clinic, [facility] Program  Post Acute Care Choice:    Choice offered to:     DME Arranged:    DME Agency:     HH Arranged:    HH Agency:     Status of Service:  Completed, signed off  If discussed at [employer], dates discussed:    Additional Comments:  Lanier Clam, RN 05/09/2018, 1:14 PM

## 2018-05-09 NOTE — Discharge Summary (Signed)
Jason [name], is a 38 y.o. male  DOB 11-08-1980  MRN [id number].  Admission date:  04/30/2018  Admitting Physician  Briscoe Deutscher, MD  Discharge Date:  05/09/2018   Primary MD  Patient, No Pcp Per  Recommendations for primary care physician for things to follow:   1)Follow- up with gastroenterologist Dr. Barron Alvine in [facility] in one week (05/16/2018) 2) avoid alcohol 3)Complete abstinence from alcohol advised 4)Take medications including antibiotics exactly as prescribed 5)Goal is to have 3-4 bowel movements every day--- constipation will cause you to get confused and disoriented 6)Avoid excessive amount of Tylenol/acetaminophen 7)Follow-up with primary care physician within a week for recheck of your CBC/complete blood count and CMP/complete metabolic profile 8) very low-salt diet advised  Admission Diagnosis  SBP (spontaneous bacterial peritonitis) (HCC) [K65.2] Liver failure without hepatic coma, unspecified chronicity (HCC) [K72.90]   Discharge Diagnosis  SBP (spontaneous bacterial peritonitis) (HCC) [K65.2] Liver failure without hepatic coma, unspecified chronicity (HCC) [K72.90]    Principal Problem:   Decompensated hepatic cirrhosis (HCC) Active Problems:   SBP (spontaneous bacterial peritonitis) (HCC)   Macrocytic anemia   Thrombocytopenia (HCC)   Hypokalemia   Alcohol abuse   Liver lesion, right lobe   Liver failure (HCC)   Bacteremia   Ascites      History reviewed. No pertinent past medical history.  History reviewed. No pertinent surgical history.     HPI  from the history and physical done on the day of admission:    PCP: Patient, No Pcp Per   Patient coming from: Home   Chief Complaint: Severe abdominal pain, N/V   HPI: Jason [name] is a 38 y.o. male with medical history significant for alcohol abuse and liver disease, now presenting to emergency department  for evaluation of severe abdominal pain with increased abdominal distention, nausea, and nonbloody vomiting.  Patient reports a history of alcohol abuse, states that he saw a physician several months ago and was diagnosed with liver disease and prescribed several medications that he does not recall the names of.  He had intended to stop drinking at that time, but unfortunately has continued with last drink this morning.  Over the past 1 to 2 days, he has developed severe generalized abdominal pain with increased abdominal distention, nausea and nonbloody vomiting.  He denies any melena, hematochezia, or hematemesis.  Has not noted any fevers, but has had some chills.  Denies chest pain, cough, shortness of breath, or dysuria.  ED Course: Upon arrival to the ED, patient is found to be afebrile, saturating well on room air, slightly tachycardic, and with stable blood pressure.  Chemistry panel is notable for potassium of 3.1, albumin 2.1, mild elevation in transaminases, and total bilirubin of 9.8.  CBC features a leukocytosis to 24,000, microcytic anemia with hemoglobin 10.8, and platelets 52,000.  Lactic acid was elevated to 2.64.  INR is elevated to 7.73.  Blood cultures were collected, 2 L normal saline administered, morphine given, and paracentesis was performed with removal of approximately 2 L  of turbid fluid.  Sample of peritoneal fluid was sent to the lab for cultures and additional analysis, and the patient was treated with cefepime.  Lactic acid normalized, blood pressure remained stable, and the patient will be observed for further evaluation and management of decompensated liver cirrhosis with concern for SBP.   Hospital Course:    Principal Problem:   Decompensated hepatic cirrhosis (HCC) Active Problems:   SBP (spontaneous bacterial peritonitis) (HCC)   Macrocytic anemia   Thrombocytopenia (HCC)   Hypokalemia   Alcohol abuse   Liver lesion, right lobe   Liver failure (HCC)    Bacteremia   Ascites  1)Rothia mucilaginosa bacteremia - Noted on 2/2 blood cultures -Appreciate ID input, treated with IV Rocephin while inpatient, per ID okay to discharge on p.o. Keflex 500 4 times daily to complete 14 day course upon discharge, Thereafter, would give cipro 750mg  weekly for SBP proph-every Tuesday -2d echo performed, poor visulalization of valves to r/o endocarditis =    2)Decompensated cirrhosis- -Hx ETOH abuse,  -Ammonia and bilirubin trending down -Had been continued on empiric rocephin for SBP coverage -Consulted GI. Initial recommendation to transfer to [facility], however [facility] had decined accepting patient -Pt had been continued on acetylcystine, acetylcystine now stopped, given vitamin K -Overall poor prognosis, although pt is showing improvement. Recommendation for follow up at liver transplant center on discharge--- last EtOH intake about 4 weeks ago, GI recommends twice daily lactulose dosing, Lasix 40 mg p.o., Aldactone 100 mg daily--- may need potassium supplementation, status post paracentesis on 05/08/2018 with removal of 2.7 L, initial studies negative for SBP, outpatient abdominal MRI in 3 months along with outpatient EGD----appointment given with GI physician on 05/16/2018 to further discuss this  anemia; thrombocytopenia -Presenting Hgb is 10.8 with MCV 104.6 and platelets 52,000 -He denies any melena, hematochezia, or hematemesis -Likely secondary to alcohol abuse and liver disease -Continue to monitor platelets--- repeat CBC within a week advised   3.  Disposition---  discharge home on 05/09/2018 if continues to improve  4. Alcohol abuse  - Reports he had intended to quit after learning of his liver problems several months ago, but has continued with last drink the am of 04/30/18. Family questions claim of drinking on day of admit -No EtOH use in about 4 weeks, - No evidence of withdrawals a this time  Stable at this time,   5.  Liver lesions  - Subcentimeter right lobe liver lesions noted on CT  - Planning for follow-up with MRI recommended. GI recommends follow up MRI in 3 months - Stable  6. Metabolic encephalopathy -Mental status is back to baseline, likely was secondary to hepatic encephalopathy given ammonia just under 200 vs active infection from possible SBP vs bacteremia per below - Continued patient on scheduled lactulose -Ammonia now normalized - Continue abx as above #1 - Mentation much improved, oriented-----GI recommends continue lactulose twice daily  Code Status: Full Family Communication:  Patient brother by phone Disposition Plan:  home Consultants:   GI  ID  Procedures:   Paracentesis with removal of 2.7 L on 05/08/2018   Discharge Condition: stable  Follow UP  Follow-up Information    Cirigliano, Vito V, DO Follow up on 05/16/2018.   Specialty:  Gastroenterology Why:  8:30 am Contact information: [address] [place] [address] [phone]        [facility]. Schedule an appointment as soon as possible for a visit.  Contact information: [address] [place] [address] [phone]          Consults obtained -   Diet and Activity recommendation:  As advised  Discharge Instructions    Discharge Instructions    Call MD for:  difficulty breathing, headache or visual disturbances   Complete by:  As directed    Call MD for:  persistant dizziness or light-headedness   Complete by:  As directed    Call MD for:  persistant nausea and vomiting   Complete by:  As directed    Call MD for:  redness, tenderness, or signs of infection (pain, swelling, redness, odor or green/yellow discharge around incision site)   Complete by:  As directed    Call MD for:  severe uncontrolled pain   Complete by:  As directed    Call MD for:  temperature >100.4   Complete by:  As directed    Diet - low  sodium heart healthy   Complete by:  As directed    Discharge instructions   Complete by:  As directed    1)Follow- up with gastroenterologist Dr. Barron Alvine in [facility] in one week (05/16/2018) 2) avoid alcohol 3)Complete abstinence from alcohol advised 4)Take medications including antibiotics exactly as prescribed 5)Goal is to have 3-4 bowel movements every day--- constipation will cause you to get confused and disoriented 6)Avoid excessive amount of Tylenol/acetaminophen 7)Follow-up with primary care physician within a week for recheck of your CBC/complete blood count and CMP/complete metabolic profile 8) very low-salt diet advised   Increase activity slowly   Complete by:  As directed         Discharge Medications     Allergies as of 05/09/2018   No Known Allergies     Medication List    TAKE these medications   acidophilus Caps capsule Take 1 capsule by mouth daily. Start taking on:  May 10, 2018   cephALEXin 500 MG capsule Commonly known as:  KEFLEX Take 1 capsule (500 mg total) by mouth every 6 (six) hours for 5 days. Start taking on:  May 10, 2018   ciprofloxacin 750 MG tablet Commonly known as:  CIPRO Once weekly every Monday for spontaneous bacterial peritonitis prophylaxis   folic acid 1 MG tablet Commonly known as:  FOLVITE Take 1 tablet (1 mg total) by mouth daily. Start taking on:  May 10, 2018   furosemide 40 MG tablet Commonly known as:  LASIX Take 1 tablet (40 mg total) by mouth daily with breakfast.   lactulose 10 GM/15ML solution Commonly known as:  CHRONULAC Take 30 mLs (20 g total) by mouth 2 (two) times daily.   multivitamin with minerals Tabs tablet Take 1 tablet by mouth daily. Start taking on:  May 10, 2018   ondansetron 4 MG tablet Commonly known as:  ZOFRAN Take 1 tablet (4 mg total) by mouth every 6 (six) hours as needed for nausea or vomiting.   pantoprazole 40 MG tablet Commonly known as:  PROTONIX Take 1  tablet (40 mg total) by mouth daily before lunch. Start taking on:  May 10, 2018   spironolactone 100 MG tablet Commonly known as:  ALDACTONE Take 1 tablet (100 mg total) by mouth daily. Start taking on:  May 10, 2018   thiamine 100 MG tablet Take 1 tablet (100 mg total) by mouth daily. Start taking on:  May 10, 2018       Major procedures and Radiology Reports - PLEASE review detailed  and final reports for all details, in brief -   Dg Chest 1 View  Result Date: 05/01/2018 CLINICAL DATA:  Leukocytosis EXAM: CHEST  1 VIEW COMPARISON:  None. FINDINGS: Mild cardiomegaly and vascular congestion. Low lung volumes. No confluent opacities, effusions or edema. No acute bony abnormality. IMPRESSION: Low lung volumes with mild cardiomegaly and vascular congestion. Electronically Signed   By: Charlett NoseKevin  Dover M.D.   On: 05/01/2018 13:12   Ct Abdomen Pelvis W Contrast  Result Date: 04/30/2018 CLINICAL DATA:  Cirrhosis. Nausea, vomiting and abdominal pain and distention. EXAM: CT ABDOMEN AND PELVIS WITH CONTRAST TECHNIQUE: Multidetector CT imaging of the abdomen and pelvis was performed using the standard protocol following bolus administration of intravenous contrast. CONTRAST:  100mL ISOVUE-300 IOPAMIDOL (ISOVUE-300) INJECTION 61% COMPARISON:  None. FINDINGS: Lower chest: No significant pulmonary nodules or acute consolidative airspace disease. Small lower esophageal varices. Hepatobiliary: Liver surface is diffusely irregular compatible with cirrhosis. Two scattered subcentimeter hypodense right liver lobe lesions, too small to characterize. No additional liver lesions. Mild diffuse gallbladder wall thickening. No gallbladder distention. No radiopaque cholelithiasis. No biliary ductal dilatation. Pancreas: Normal, with no mass or duct dilation. Spleen: Mild splenomegaly (craniocaudal splenic length 16.1 cm). No splenic mass. Adrenals/Urinary Tract: Normal adrenals. Nonobstructing stones in the  right greater than left kidneys, largest 4 mm in the lower right kidney. No hydronephrosis. No renal masses. Normal bladder. Stomach/Bowel: Stomach mildly distended by fluid and otherwise normal. Normal caliber small bowel. Mild wall thickening throughout the proximal to mid small bowel. Normal appendix. Normal large bowel with no diverticulosis, large bowel wall thickening or pericolonic fat stranding. Vascular/Lymphatic: Normal caliber abdominal aorta. Patent portal, splenic, hepatic and renal veins. Large paraumbilical varices. Large proximal gastric varices. No pathologically enlarged lymph nodes in the abdomen or pelvis. Reproductive: Normal size prostate Other: No pneumoperitoneum. Moderate volume simple ascites. No focal fluid collection. Anasarca. Musculoskeletal: No aggressive appearing focal osseous lesions. Sequela of congenital right hip dysplasia. Mild thoracic spondylosis. IMPRESSION: 1. Cirrhosis. Two subcentimeter hypodense right liver lobe lesions, too small to characterize. Recommend follow-up MRI abdomen without and with IV contrast in 3-6 months. This recommendation follows ACR consensus guidelines: Managing Incidental Findings on Abdominal CT: White Paper of the ACR Incidental Findings Committee. J Am Coll Radiol 2010;7:754-773. 2. Stigmata of portal hypertension including mild splenomegaly, moderate volume ascites and large paraumbilical and proximal gastric and small lower esophageal varices. 3. Mild wall thickening throughout the proximal to mid small bowel. Mild wall thickening in gallbladder. Anasarca. These findings are nonspecific and probably due to noninflammatory edema (secondary to hypoalbuminemia). 4. Nonobstructing bilateral renal stones. No hydronephrosis. Electronically Signed   By: Delbert PhenixJason A Poff M.D.   On: 04/30/2018 23:01   [place] Abdomen Limited  Result Date: 05/01/2018 CLINICAL DATA:  Acute onset of generalized abdominal pain. Hepatic cirrhosis and hyperbilirubinemia. EXAM:  ULTRASOUND ABDOMEN LIMITED RIGHT UPPER QUADRANT COMPARISON:  CT of the abdomen and pelvis from 04/30/2018 FINDINGS: Gallbladder: Diffuse gallbladder wall thickening is noted, measuring 6 mm. Pericholecystic fluid is nonspecific in the presence of ascites. Evaluation for ultrasonographic Murphy's sign is not possible as the patient was unresponsive during the study. No definite stones are seen. Common bile duct: Diameter: 0.6 cm, within normal limits in caliber. Liver: No focal lesion identified. The nodular contour of the liver is compatible with hepatic cirrhosis. Increased parenchymal echogenicity is noted. Portal vein is patent on color Doppler imaging with normal direction of blood flow towards the liver. Small to moderate volume ascites is noted  at the right upper quadrant. IMPRESSION: 1. Diffuse gallbladder wall thickening. Pericholecystic fluid is nonspecific in the presence of ascites. No definite stones seen. Cholecystitis cannot be excluded, but is not well characterized given ascites. No evidence of obstruction. 2. Changes of hepatic cirrhosis. 3. Small to moderate volume residual ascites at the right upper quadrant, status post recent paracentesis. Electronically Signed   By: Roanna Raider M.D.   On: 05/01/2018 03:06   US Paracentesis  Result Date: 05/08/2018 INDICATION: Patient with history of decompensated alcoholic cirrhosis, abdominal distension and recurrent ascites. Request is made for diagnostic and therapeutic paracentesis. EXAM: ULTRASOUND GUIDED DIAGNOSTIC AND THERAPEUTIC PARACENTESIS MEDICATIONS: 10 mL of 1% lidocaine COMPLICATIONS: None immediate. PROCEDURE: Informed written consent was obtained from the patient after a discussion of the risks, benefits and alternatives to treatment. A timeout was performed prior to the initiation of the procedure. Initial ultrasound scanning demonstrates a moderate amount of ascites within the right lower abdominal quadrant. The right lower abdomen was  prepped and draped in the usual sterile fashion. 1% lidocaine was used for local anesthesia. Following this, a 19 gauge, 10-cm, Yueh catheter was introduced. An ultrasound image was saved for documentation purposes. The paracentesis was performed. The catheter was removed and a dressing was applied. The patient tolerated the procedure well without immediate post procedural complication. FINDINGS: A total of approximately 2.7 L of clear gold fluid was removed. Samples were sent to the laboratory as requested by the clinical team. IMPRESSION: Successful ultrasound-guided paracentesis yielding 2.7 L of peritoneal fluid. Read by: Elwin Mocha, PA-C Electronically Signed   By: Simonne Come M.D.   On: 05/08/2018 10:49    Micro Results    Recent Results (from the past 240 hour(s))  Culture, blood (routine x 2)     Status: Abnormal   Collection Time: 04/30/18  9:06 PM  Result Value Ref Range Status   Specimen Description   Final    BLOOD RIGHT WRIST Performed at [facility], [address], [place], [place] [address]    Special Requests   Final    BOTTLES DRAWN AEROBIC AND ANAEROBIC Blood Culture results may not be optimal due to an excessive volume of blood received in culture bottles Performed at [facility], [address] [place], [place] [address]    Culture  Setup Time   Final    GRAM POSITIVE COCCI IN BOTH AEROBIC AND ANAEROBIC BOTTLES CRITICAL VALUE NOTED.  VALUE IS CONSISTENT WITH PREVIOUSLY REPORTED AND CALLED VALUE. Performed at [facility], [address], [place], [place] [address]    Culture Marianna Fuss (A)  Final   Report Status 05/03/2018 FINAL  Final  Culture, blood (routine x 2)     Status: Abnormal   Collection Time: 04/30/18  9:48 PM  Result Value Ref Range Status   Specimen Description   Final    BLOOD LEFT ANTECUBITAL Performed at [facility], [address], [place], [place] [address]     Special Requests   Final    BOTTLES DRAWN AEROBIC AND ANAEROBIC Blood Culture results may not be optimal due to an excessive volume of blood received in culture bottles Performed at [facility], [address], [place], [place] [address]    Culture  Setup Time   Final    GRAM POSITIVE COCCI IN CLUSTERS IN BOTH AEROBIC AND ANAEROBIC BOTTLES CRITICAL RESULT CALLED TO, READ BACK BY AND VERIFIED WITH: Damaris Hippo PharmD 14:20 05/01/18 (  [email or web address]) Performed at [facility], [address], [place], [place] [address]    Culture Marianna Fuss (A)  Final   Report Status 05/03/2018 FINAL  Final  Blood Culture ID Panel (Reflexed)     Status: None   Collection Time: 04/30/18  9:48 PM  Result Value Ref Range Status   Enterococcus species NOT DETECTED NOT DETECTED Final   Listeria monocytogenes NOT DETECTED NOT DETECTED Final   Staphylococcus species NOT DETECTED NOT DETECTED Final   Staphylococcus aureus (BCID) NOT DETECTED NOT DETECTED Final   Streptococcus species NOT DETECTED NOT DETECTED Final   Streptococcus agalactiae NOT DETECTED NOT DETECTED Final   Streptococcus pneumoniae NOT DETECTED NOT DETECTED Final   Streptococcus pyogenes NOT DETECTED NOT DETECTED Final   Acinetobacter baumannii NOT DETECTED NOT DETECTED Final   Enterobacteriaceae species NOT DETECTED NOT DETECTED Final   Enterobacter cloacae complex NOT DETECTED NOT DETECTED Final   Escherichia coli NOT DETECTED NOT DETECTED Final   Klebsiella oxytoca NOT DETECTED NOT DETECTED Final   Klebsiella pneumoniae NOT DETECTED NOT DETECTED Final   Proteus species NOT DETECTED NOT DETECTED Final   Serratia marcescens NOT DETECTED NOT DETECTED Final   Haemophilus influenzae NOT DETECTED NOT DETECTED Final   Neisseria meningitidis NOT DETECTED NOT DETECTED Final   Pseudomonas aeruginosa NOT DETECTED NOT DETECTED Final   Candida albicans NOT DETECTED NOT DETECTED Final   Candida glabrata NOT DETECTED NOT  DETECTED Final   Candida krusei NOT DETECTED NOT DETECTED Final   Candida parapsilosis NOT DETECTED NOT DETECTED Final   Candida tropicalis NOT DETECTED NOT DETECTED Final    Comment: Performed at [facility], [address] [place], [place] [address]  Body fluid culture     Status: None   Collection Time: 05/01/18 12:10 AM  Result Value Ref Range Status   Specimen Description   Final    PERITONEAL CAVITY Performed at [facility], [address] [place], [place] [address]    Special Requests   Final    NONE Performed at [facility], [address], [place], [place] [address]    Gram Stain   Final    FEW WBC PRESENT,BOTH PMN AND MONONUCLEAR NO ORGANISMS SEEN Performed at [facility], [address], [place], [place] [address]    Culture NO GROWTH  Final   Report Status 05/04/2018 FINAL  Final  MRSA PCR Screening     Status: None   Collection Time: 05/01/18 11:52 AM  Result Value Ref Range Status   MRSA by PCR NEGATIVE NEGATIVE Final    Comment:        The GeneXpert MRSA Assay (FDA approved for NASAL specimens only), is one component of a comprehensive MRSA colonization surveillance program. It is not intended to diagnose MRSA infection nor to guide or monitor treatment for MRSA infections. Performed at [facility], [address], [place], [place] [address]   Culture, blood (routine x 2)     Status: None   Collection Time: 05/02/18  5:39 PM  Result Value Ref Range Status   Specimen Description   Final    BLOOD RIGHT ARM Performed at [facility], [address] [place], [place] [address]    Special Requests   Final    BOTTLES DRAWN AEROBIC AND ANAEROBIC Blood Culture adequate volume Performed at [facility], [address], [place], [place] [address]    Culture   Final  NO GROWTH 5 DAYS Performed at [facility], [address],  [place], [place] [address]    Report Status 05/07/2018 FINAL  Final  Culture, blood (routine x 2)     Status: None   Collection Time: 05/02/18  5:39 PM  Result Value Ref Range Status   Specimen Description   Final    BLOOD RIGHT HAND Performed at [facility], [address] [place], [place] [address]    Special Requests   Final    BOTTLES DRAWN AEROBIC ONLY Blood Culture adequate volume Performed at [facility], [address] [place], [place] [address]    Culture   Final    NO GROWTH 5 DAYS Performed at [facility], [address] [place], [place] [address]    Report Status 05/07/2018 FINAL  Final  Culture, body fluid-bottle     Status: None (Preliminary result)   Collection Time: 05/08/18 11:01 AM  Result Value Ref Range Status   Specimen Description FLUID  Final   Special Requests NONE  Final   Culture   Final    NO GROWTH 1 DAY Performed at [facility], [address] [place], [place] [address]    Report Status PENDING  Incomplete  Gram stain     Status: None   Collection Time: 05/08/18 11:01 AM  Result Value Ref Range Status   Specimen Description FLUID  Final   Special Requests NONE  Final   Gram Stain   Final    CYTOSPIN SMEAR WBC PRESENT, PREDOMINANTLY MONONUCLEAR NO ORGANISMS SEEN Performed at [facility], [address] [place], [place] [address]    Report Status 05/08/2018 FINAL  Final    Today   Subjective    Jason [name] today has no new complaints, No fever  Or chills , no nausea no vomiting , tolerating  oral intake well          Patient has been seen and examined prior to discharge   Objective   Blood pressure 130/68, pulse (!) 104, temperature 98.9 F (37.2 C), temperature source Oral, resp. rate 18, height 5\' 5"  (1.651 m), weight 105.8 kg, SpO2 100 %.   Intake/Output Summary (Last 24 hours) at 05/09/2018 1748 Last data filed at 05/09/2018 1544 Gross per 24 hour  Intake 1447.72 ml   Output -  Net 1447.72 ml    Exam General exam: Conversant, in no acute distress Respiratory system: normal chest rise, clear, no audible wheezing Cardiovascular system: regular rhythm, s1-s2 Gastrointestinal system: Soft, no significant distention after paracentesis on 05/08/2018, no significant tenderness, bowel sounds present Central nervous system: No seizures, no tremors Extremities: No cyanosis, no joint deformities Skin: No rashes, no pallor,   Psychiatry: Affect normal // no auditory hallucinations    Data Review   CBC w Diff:  Lab Results  Component Value Date   WBC 10.1 05/09/2018   HGB 9.2 (L) 05/09/2018   HCT 27.5 (L) 05/09/2018   PLT 31 (L) 05/09/2018   LYMPHOPCT 14 05/06/2018   BANDSPCT 0 05/06/2018   MONOPCT 18 05/06/2018   EOSPCT 2 05/06/2018   BASOPCT 0 05/06/2018    CMP:  Lab Results  Component Value Date   NA 132 (L) 05/09/2018   K 4.0 05/09/2018   CL 104 05/09/2018   CO2 22 05/09/2018   BUN 10 05/09/2018   CREATININE 0.48 (L) 05/09/2018   PROT 4.9 (L) 05/09/2018   ALBUMIN 2.1 (L)  05/09/2018   BILITOT 8.8 (H) 05/09/2018   ALKPHOS 109 05/09/2018   AST 87 (H) 05/09/2018   ALT 60 (H) 05/09/2018  .   Total Discharge time is about 33 minutes  Shon Haleourage Jason [name] M.D on 05/09/2018 at 5:48 PM  Go to [email or web address] -  for contact info  [facility] - Office  [phone]

## 2018-05-09 NOTE — Progress Notes (Signed)
Patient's labs are stable this AM.  Ok for discharge from GI standpoint.  Should go home on lactulose 20 grams BID, lasix 40 mg daily, and spironolactone 100 mg daily.  Also needs abx per ID recs.  Has a follow-up with Dr. Barron Alvine in [facility] in one week (1/17); appt is in discharge information.

## 2018-05-13 LAB — CULTURE, BODY FLUID-BOTTLE

## 2018-05-13 LAB — CULTURE, BODY FLUID W GRAM STAIN -BOTTLE: Culture: NO GROWTH

## 2018-05-16 ENCOUNTER — Encounter: Payer: Self-pay | Admitting: Nurse Practitioner

## 2018-05-16 ENCOUNTER — Encounter: Payer: Self-pay | Admitting: Gastroenterology

## 2018-05-16 ENCOUNTER — Ambulatory Visit: Payer: Self-pay | Admitting: Gastroenterology

## 2018-05-16 VITALS — BP 126/64 | HR 91 | Ht 64.0 in | Wt 206.4 lb

## 2018-05-16 DIAGNOSIS — R600 Localized edema: Secondary | ICD-10-CM

## 2018-05-16 DIAGNOSIS — K7290 Hepatic failure, unspecified without coma: Secondary | ICD-10-CM

## 2018-05-16 DIAGNOSIS — R7881 Bacteremia: Secondary | ICD-10-CM

## 2018-05-16 DIAGNOSIS — K7031 Alcoholic cirrhosis of liver with ascites: Secondary | ICD-10-CM

## 2018-05-16 DIAGNOSIS — K7682 Hepatic encephalopathy: Secondary | ICD-10-CM

## 2018-05-16 NOTE — Patient Instructions (Addendum)
If you are age 36 or older, your body mass index should be between 23-30. Your Body mass index is 35.42 kg/m. If this is out of the aforementioned range listed, please consider follow up with your Primary Care Provider.  If you are age 41 or younger, your body mass index should be between 19-25. Your Body mass index is 35.42 kg/m. If this is out of the aformentioned range listed, please consider follow up with your Primary Care Provider.   Low Sodium Diet A main source of sodium is table salt. The average American eats five or more teaspoons of salt each day. This is about 20 times as much as the body needs. In fact, your body needs only 1/4 teaspoon of salt every day. Sodium is found naturally in foods, but a lot of it is added during processing and preparation. Many foods that do not taste salty may still be high in sodium. Large amounts of sodium can be hidden in canned, processed and convenience foods. And sodium can be found in many foods that are served at [employer]. Sodium controls fluid balance in our bodies and maintains blood volume and blood pressure. Eating too much sodium may raise blood pressure and cause fluid retention, which could lead to swelling of the legs and feet or other health issues. When limiting sodium in your diet, a common target is to eat less than 2,000 milligrams of sodium per day.  General Guidelines for Cutting Down on Salt Eliminate salty foods from your diet and reduce the amount of salt used in cooking. Sea salt is no better than regular salt.  Choose low sodium foods. Many salt-free or reduced salt products are available. When reading food labels, low sodium is defined as 140 mg of sodium per serving.  Salt substitutes are sometimes made from potassium, so read the label. If you are on a low potassium diet, then check with your doctor before using those salt substitutes.  Be creative and season your foods with spices, herbs, lemon, garlic, ginger,  vinegar and pepper. Remove the salt shaker from the table.  Read ingredient labels to identify foods high in sodium. Items with 400 mg or more of sodium are high in sodium. High sodium food additives include salt, brine, or other items that say sodium, such as monosodium glutamate.  Eat more home-cooked meals. Foods cooked from scratch are naturally lower in sodium than most instant and boxed mixes.  Don't use softened water for cooking and drinking since it contains added salt.  Avoid medications which contain sodium such as [place] Tour manager.  For more information; food composition books are available which tell how much sodium is in food. Online sources such as www.calorieking.com also list amounts.  Meats, Poultry, Fish, Legumes, Eggs and Nuts High-Sodium Foods Smoked, cured, salted or canned meat, fish or poultry including bacon, cold cuts, ham, frankfurters, sausage, sardines, caviar and anchovies  Frozen breaded meats and dinners, such as burritos and pizza  Canned entrees, such as ravioli, spam and chili  Salted nuts  Beans canned with salt added Low-Sodium Alternatives Any fresh or frozen beef, lamb, pork, poultry and fish  Eggs and egg substitutes  Low-sodium peanut butter  Dry peas and beans (not canned)  Low-sodium canned fish  Drained, water or oil packed canned fish or poultry Dairy Products High-Sodium Foods Buttermilk  Regular and processed cheese, cheese spreads and sauces  Cottage cheese Low-Sodium Alternatives Milk, yogurt, ice cream and ice milk  Low-sodium  cheeses, cream cheese, ricotta cheese and mozzarella Breads, Grains and Cereals High-Sodium Foods Bread and rolls with salted tops  Quick breads, self-rising flour, biscuit, pancake and waffle mixes  Pizza, croutons and salted crackers  Prepackaged, processed mixes for potatoes, rice, pasta and stuffing Low-Sodium Alternatives Breads, bagels and rolls without salted tops  Muffins and most  ready-to-eat cereals  All rice and pasta, but do not to add salt when cooking  Low-sodium corn and flour tortillas and noodles  Low-sodium crackers and breadsticks  Unsalted popcorn, chips and pretzels Vegetables and Fruits High-Sodium Foods Regular canned vegetables and vegetable juices  Olives, pickles, sauerkraut and other pickled vegetables  Vegetables made with ham, bacon or salted pork  Packaged mixes, such as scalloped or au gratin potatoes, frozen hash browns and Tater Tots  Commercially prepared pasta and tomato sauces and salsa Low-Sodium Alternatives Fresh and frozen vegetables without sauces  Low-sodium canned vegetables, sauces and juices  Fresh potatoes, frozen [place] fries and instant mashed potatoes  Low-salt tomato or V-8 juice.  Most fresh, frozen and canned fruit  Dried fruits Soups High-Sodium Foods Regular canned and dehydrated soup, broth and bouillon  Cup of noodles and seasoned ramen mixes Low-Sodium Alternatives Low-sodium canned and dehydrated soups, broth and bouillon  Homemade soups without added salt Fats, Desserts and Sweets High-Sodium Foods Soy sauce, seasoning salt, other sauces and marinades  Bottled salad dressings, regular salad dressing with bacon bits  Salted butter or margarine  Instant pudding and cake  Large portions of ketchup, mustard Low-Sodium Alternatives Vinegar, unsalted butter or margarine  Vegetable oils and low sodium sauces and salad dressings  Mayonnaise  All desserts made without salt  Please go to the lab on the 2nd floor [address] before you leave the office today.   You have been referred to [facility] to establish a Primary Care Physician.  You need Flu Vaccine and Pneumonia vaccine once established care.  You have been scheduled for an MRI at [facility] on 08/04/2018. Your appointment time is 10:00am. Please arrive at 9:30AM minutes prior to your appointment time for registration purposes. Please  make certain not to have anything to eat or drink 4 hours prior to your test. In addition, if you have any metal in your body, have a pacemaker or defibrillator, please be sure to let your ordering physician know. This test typically takes 45 minutes to 1 hour to complete. Should you need to reschedule, please call [phone] to do so.  We will call you in February 2020 to schedule your Endoscopy with banding at [facility].  It was a pleasure to see you today!  Vito Cirigliano, D.O.

## 2018-05-16 NOTE — Progress Notes (Signed)
Computer anywhere   P  Chief Complaint:    Hospital followup, Cirrhosis, EtOH abuse  HPI:    Patient is a 38 y.o. 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  For the bacteremia, was treated with IV Rocephin and discharged on Keflex 500 mg 4 times daily x14 days, with plan to then start ciprofloxacin 750 mg weekly for SBP prophylaxis.He has completed the Keflex yesterday. Planning to start Cipro Monday.   Paracentesis performed on 05/08/2018 with removal of 2.7 L, with cultures negative for SBP (has been treated with antibiotics for 9 days at that point).  Recommendation for SBP prophylaxis was based on the bacteremia with an enteric organism.    He states his last alcoholic beverage was approx 3 months prior to admission (although this timeline varied during his hospital course) and no alcohol since hospital discharge.  He was discharged with lactulose, Lasix 40 mg daily, Aldactone 100 mg daily.  CT abdomen/pelvis on 04/30/2018 with cirrhotic appearing liver, 2 scattered subcentimeter hypodensities in the right liver lobe which were too small to characterize, mild splenomegaly, normal pancreas, no duct dilation, large paraumbilical varices, large proximal gastric varices.  Recommended MRI abdomen in 3 to 6 months.  Today, he states only c/o b/l LE edema. Taking Lasix and Aldactone.  Not aware of a sodium restricted diet that he was supposed to be adhering to, however.  No confusion/HE since discharge. Still taking Lactulose having 3-4 stools/day. Good PO intake and no overt GI blood loss. No n/v/f/c.  History obtained through interpreter line.  Cirrhosis: - Etiology: Alcoholic  cirrhosis; plan for extended serologic w/u today to r/o concomittant liver disease - Complications: Suspected SBP (04/2018), ascites, ?HE (admission 04/2018, but also with bacteremia at that time), radiographic evidence of portal hypertensive changes (splenomegaly, periumbilical and gastric/lower esophageal varices) - Viral hepatitis screening: Ordering today - HCC screening: AFP neg; Ordering MRI liver today to be done in 3 months - Variceal screening: Scheduling for EGD today - Vaccination status: Ordered flu and pneumonia vaccines today, checking hepatitis B status - MELD: 27 on 05/05/2018 - Child Pugh: C (13 pts) on 05/05/2018    Review of systems:     No chest pain, no SOB, no fevers, no urinary sx   No past medical history on file.  Patient's surgical history, family medical history, social history, medications and allergies were all reviewed in Epic    Current Outpatient Medications  Medication Sig Dispense Refill  . acidophilus (RISAQUAD) CAPS capsule Take 1 capsule by mouth daily. 30 capsule 3  . ciprofloxacin (CIPRO) 750 MG tablet Once weekly every Monday for spontaneous bacterial peritonitis prophylaxis 12 tablet 1  . folic acid (FOLVITE) 1 MG tablet Take 1 tablet (1 mg total) by mouth daily. 30 tablet 5  . furosemide (LASIX) 40 MG tablet Take 1 tablet (40 mg total) by mouth daily with breakfast. 30 tablet 2  . lactulose (CHRONULAC) 10 GM/15ML solution Take 30 mLs (20 g total) by mouth 2 (two) times daily. 473 mL 2  . Multiple Vitamin (MULTIVITAMIN WITH MINERALS) TABS tablet Take 1 tablet by mouth daily. 30 tablet 2  . ondansetron (ZOFRAN) 4 MG tablet Take 1 tablet (4 mg total) by mouth every 6 (six) hours as needed for nausea  or vomiting. 20 tablet 0  . pantoprazole (PROTONIX) 40 MG tablet Take 1 tablet (40 mg total) by mouth daily before lunch. 30 tablet 0  . spironolactone (ALDACTONE) 100 MG tablet Take 1 tablet (100 mg total) by mouth daily. 30 tablet 2  . thiamine 100 MG tablet  Take 1 tablet (100 mg total) by mouth daily. 30 tablet 3   No current facility-administered medications for this visit.     Physical Exam:     There were no vitals taken for this visit.  GENERAL:  Pleasant male in NAD PSYCH: : Cooperative, normal affect EENT:  conjunctiva pink, mucous membranes moist, neck supple without masses CARDIAC:  RRR, no murmur heard, no peripheral edema PULM: Normal respiratory effort, lungs CTA bilaterally, no wheezing ABDOMEN:  Nondistended, soft, nontender. No obvious masses, no hepatomegaly,  normal bowel sounds SKIN:  turgor, no lesions seen Musculoskeletal:  Normal muscle tone, normal strength NEURO: Alert and oriented x 3, no focal neurologic deficits Extrem: Trace LE edema bilaterally to knees, no cyanosis or clubbing   IMPRESSION and PLAN:    #1.  EtOH Cirrhosis: Recent admission for AMS in the setting of EtOH cirrhosis with ?  Superimposed Alcoholic Hepatitis along with sepsis/bacteremia.  Overall, he is made a significant clinical improvement and presents today determined to quit EtOH and start a road to long-term recovery.  We will plan on further evaluation and treatment as below:  - EGD at [facility] for EV screening -Resume Lasix and Aldactone -Resume lactulose - Starting ciprofloxacin for SBP prophylaxis per ID recommendations - Check serologies for concomittant liver disease - Flu and Pneumo vaccines today - Check viral hep studies and vaccinate as appropriate - MRI Liver for [facility] screening and evaluation of previously noted subcentimeter nodules on admission CT.  To be done in 3 months - Check Ethyl Gluc on f/u appt -Plan for Hepatology referral if still abstinent on f/u appt and pending repeat MELD labs - Low Na diet handout -Provided assistance for linking him with local AA programs - RTC in 2-3 months or sooner prn  #2.  Bacteremia: Completed antimicrobial course per ID recommendations.  No recurrence of confusion.  Bacteremia was  secondary to opportunistic infection.  Per lit search, associated with EtOH, liver disease.  HIV negative as inpatient.  #3.  Ascites: No appreciable ascites on exam today.  Continues to take diuretics as prescribed above.  #4.  LE Edema: Does have trace LE bilateral edema.  Likely secondary to dietary indiscretion. -Provided with  Low Na handout -Resume diuretics as above - Checking CHEM panel today.  Start K+ supplement as appropriate pending labs  The indications, risks, and benefits of EGD were explained to the patient in detail. Risks include but are not limited to bleeding, perforation, adverse reaction to medications, and cardiopulmonary compromise. Sequelae include but are not limited to the possibility of surgery, hositalization, and mortality. The patient verbalized understanding and wished to proceed. All questions answered, referred to scheduler. Further recommendations pending results of the exam.   I spent a total of 25 minutes of face-to-face time with the patient. Greater than 50% of the time was spent counseling and coordinating care.       Shellia Cleverly ,DO, FACG 05/16/2018, 8:35 AM

## 2018-05-19 ENCOUNTER — Telehealth: Payer: Self-pay | Admitting: Gastroenterology

## 2018-05-19 ENCOUNTER — Other Ambulatory Visit (INDEPENDENT_AMBULATORY_CARE_PROVIDER_SITE_OTHER): Payer: Self-pay

## 2018-05-19 DIAGNOSIS — K7031 Alcoholic cirrhosis of liver with ascites: Secondary | ICD-10-CM

## 2018-05-19 DIAGNOSIS — R600 Localized edema: Secondary | ICD-10-CM

## 2018-05-19 LAB — COMPREHENSIVE METABOLIC PANEL
ALT: 51 U/L (ref 0–53)
AST: 82 U/L — ABNORMAL HIGH (ref 0–37)
Albumin: 2.9 g/dL — ABNORMAL LOW (ref 3.5–5.2)
Alkaline Phosphatase: 166 U/L — ABNORMAL HIGH (ref 39–117)
BUN: 7 mg/dL (ref 6–23)
CO2: 22 mEq/L (ref 19–32)
Calcium: 8.5 mg/dL (ref 8.4–10.5)
Chloride: 105 mEq/L (ref 96–112)
Creatinine, Ser: 0.62 mg/dL (ref 0.40–1.50)
GFR: 147.49 mL/min (ref >60.00)
Glucose, Bld: 92 mg/dL (ref 70–99)
Potassium: 3.8 mEq/L (ref 3.5–5.1)
SODIUM: 135 meq/L (ref 135–145)
Total Bilirubin: 9.2 mg/dL — ABNORMAL HIGH (ref 0.2–1.2)
Total Protein: 6.2 g/dL (ref 6.0–8.3)

## 2018-05-19 LAB — CBC WITH DIFFERENTIAL/PLATELET
Basophils Absolute: 0.1 10*3/uL (ref 0.0–0.1)
Basophils Relative: 1.2 % (ref 0.0–3.0)
Eosinophils Absolute: 0.1 10*3/uL (ref 0.0–0.7)
Eosinophils Relative: 2.5 % (ref 0.0–5.0)
HCT: 33.4 % — ABNORMAL LOW (ref 39.0–52.0)
HEMOGLOBIN: 11.6 g/dL — AB (ref 13.0–17.0)
Lymphocytes Relative: 26.9 % (ref 12.0–46.0)
Lymphs Abs: 1.6 10*3/uL (ref 0.7–4.0)
MCHC: 34.8 g/dL (ref 30.0–36.0)
MCV: 107.7 fl — ABNORMAL HIGH (ref 78.0–100.0)
Monocytes Absolute: 0.8 10*3/uL (ref 0.1–1.0)
Monocytes Relative: 12.9 % — ABNORMAL HIGH (ref 3.0–12.0)
Neutro Abs: 3.4 10*3/uL (ref 1.4–7.7)
Neutrophils Relative %: 56.5 % (ref 43.0–77.0)
Platelets: 40 10*3/uL — CL (ref 150.0–400.0)
RBC: 3.11 Mil/uL — AB (ref 4.22–5.81)
RDW: 18.1 % — ABNORMAL HIGH (ref 11.5–15.5)
WBC: 6 10*3/uL (ref 4.0–10.5)

## 2018-05-19 LAB — PROTIME-INR
INR: 1.9 ratio — ABNORMAL HIGH (ref 0.8–1.0)
Prothrombin Time: 21.9 s — ABNORMAL HIGH (ref 9.6–13.1)

## 2018-05-19 NOTE — Telephone Encounter (Signed)
Jason [name], lab tech reports (after 2 patient identifiers verified)-the patient's platelet level = 40,000  Please review patient records  Please advise

## 2018-05-19 NOTE — Telephone Encounter (Signed)
He has EtOH cirrhosis and this is actually an improvement from his prior. Thank you.

## 2018-05-23 LAB — TISSUE TRANSGLUTAMINASE ABS,IGG,IGA
(tTG) Ab, IgA: 1 U/mL
(tTG) Ab, IgG: 3 U/mL

## 2018-05-23 LAB — ANA: Anti Nuclear Antibody(ANA): NEGATIVE

## 2018-05-23 LAB — HEPATITIS B SURFACE ANTIBODY,QUALITATIVE: HEP B S AB: NONREACTIVE

## 2018-05-23 LAB — MITOCHONDRIAL ANTIBODIES

## 2018-05-23 LAB — HEPATITIS C ANTIBODY
Hepatitis C Ab: NONREACTIVE
SIGNAL TO CUT-OFF: 0.04 (ref <1.00)

## 2018-05-23 LAB — ANTI-SMOOTH MUSCLE ANTIBODY, IGG: Actin (Smooth Muscle) Antibody (IGG): <20 U (ref <20)

## 2018-05-23 LAB — ALPHA-1-ANTITRYPSIN: A1 ANTITRYPSIN SER: 163 mg/dL (ref 83–199)

## 2018-05-23 LAB — HEPATITIS B SURFACE ANTIGEN: HEP B S AG: NONREACTIVE

## 2018-05-23 LAB — IGM: IgM, Serum: 192 mg/dL (ref 50–300)

## 2018-05-23 LAB — HEPATITIS A ANTIBODY, TOTAL: HEPATITIS A AB,TOTAL: REACTIVE — AB

## 2018-05-23 LAB — CERULOPLASMIN: Ceruloplasmin: 22 mg/dL (ref 18–36)

## 2018-05-26 ENCOUNTER — Other Ambulatory Visit (HOSPITAL_COMMUNITY): Payer: Self-pay

## 2018-06-10 ENCOUNTER — Telehealth: Payer: Self-pay | Admitting: Internal Medicine

## 2018-06-10 ENCOUNTER — Ambulatory Visit: Payer: Self-pay | Attending: Internal Medicine | Admitting: Internal Medicine

## 2018-06-10 ENCOUNTER — Encounter: Payer: Self-pay | Admitting: Internal Medicine

## 2018-06-10 VITALS — BP 119/79 | HR 85 | Temp 98.5°F | Resp 16 | Ht 66.5 in | Wt 196.6 lb

## 2018-06-10 DIAGNOSIS — D649 Anemia, unspecified: Secondary | ICD-10-CM | POA: Insufficient documentation

## 2018-06-10 DIAGNOSIS — F10288 Alcohol dependence with other alcohol-induced disorder: Secondary | ICD-10-CM

## 2018-06-10 DIAGNOSIS — K7031 Alcoholic cirrhosis of liver with ascites: Secondary | ICD-10-CM | POA: Insufficient documentation

## 2018-06-10 DIAGNOSIS — F1020 Alcohol dependence, uncomplicated: Secondary | ICD-10-CM | POA: Insufficient documentation

## 2018-06-10 DIAGNOSIS — Z23 Encounter for immunization: Secondary | ICD-10-CM | POA: Insufficient documentation

## 2018-06-10 DIAGNOSIS — K7290 Hepatic failure, unspecified without coma: Secondary | ICD-10-CM | POA: Insufficient documentation

## 2018-06-10 DIAGNOSIS — Z79899 Other long term (current) drug therapy: Secondary | ICD-10-CM | POA: Insufficient documentation

## 2018-06-10 DIAGNOSIS — Z87891 Personal history of nicotine dependence: Secondary | ICD-10-CM | POA: Insufficient documentation

## 2018-06-10 DIAGNOSIS — K7011 Alcoholic hepatitis with ascites: Secondary | ICD-10-CM | POA: Insufficient documentation

## 2018-06-10 DIAGNOSIS — E876 Hypokalemia: Secondary | ICD-10-CM | POA: Insufficient documentation

## 2018-06-10 MED ORDER — PANTOPRAZOLE SODIUM 40 MG PO TBEC: 40.0000 mg | DELAYED_RELEASE_TABLET | Freq: Every day | ORAL | 5 refills | Status: DC

## 2018-06-10 MED ORDER — CIPROFLOXACIN HCL 750 MG PO TABS: ORAL_TABLET | ORAL | 3 refills | Status: DC

## 2018-06-10 MED ORDER — FUROSEMIDE 40 MG PO TABS: 40.0000 mg | ORAL_TABLET | Freq: Every day | ORAL | 5 refills | Status: DC

## 2018-06-10 MED ORDER — CIPROFLOXACIN HCL 250 MG PO TABS: 250.0000 mg | ORAL_TABLET | ORAL | 3 refills | Status: DC

## 2018-06-10 MED ORDER — SPIRONOLACTONE 100 MG PO TABS: 100.0000 mg | ORAL_TABLET | Freq: Every day | ORAL | 5 refills | Status: DC

## 2018-06-10 MED ORDER — LACTULOSE 10 GM/15ML PO SOLN: 20.0000 g | Freq: Two times a day (BID) | ORAL | 5 refills | Status: DC

## 2018-06-10 MED ORDER — THIAMINE HCL 100 MG PO TABS: 100.0000 mg | ORAL_TABLET | Freq: Every day | ORAL | 5 refills | Status: DC

## 2018-06-10 MED FILL — PANTOPRAZOLE SOD DR 40 MG T: 40 | 30 days supply | Qty: 30 | Fill #0

## 2018-06-10 MED FILL — SPIRONOLACTONE 100 MG TAB: 100 | 30 days supply | Qty: 30 | Fill #0

## 2018-06-10 MED FILL — LACTULOSE 10 GM/15 ML SOLN: 10 | 7 days supply | Qty: 473 | Fill #0

## 2018-06-10 MED FILL — FUROSEMIDE 40 MG TAB: 40 | 30 days supply | Qty: 30 | Fill #0

## 2018-06-10 MED FILL — CIPROFLOXACIN HCL 250 MG TA: 250 | 28 days supply | Qty: 12 | Fill #0

## 2018-06-10 NOTE — Progress Notes (Signed)
Pt is unsure of medications

## 2018-06-10 NOTE — Patient Instructions (Addendum)
Please give appointment with clinical pharmacist in 1 month for second shot of hepatitis B vaccine   Vacuna antineumoccica de polisacridos: lo que debe saber Pneumococcal Polysaccharide Vaccine: What You Need to Know 1. Por qu vacunarse? La vacunacin puede proteger a los [employer] (y a algunos nios y adultos ms jvenes) de la enfermedad neumoccica. La enfermedad neumoccica es causada por una bacteria que puede contagiarse de [place] persona a otra por contacto directo. Puede provocar infecciones en los odos y tambin infecciones ms graves en:  Los pulmones (neumona),  La sangre (bacteriemia), y  Las membranas que cubren el cerebro y la mdula espinal (meningitis). La meningitis puede causar sordera y dao cerebral, y puede ser mortal. Cualquier persona puede contraer la enfermedad neumoccica, pero los nios menores de 2 aos, las personas con [employer], los adultos mayores de 65 aos y los fumadores son los que corren un mayor riesgo. Cada ao, mueren alrededor de [id number] adultos mayores a causa de la enfermedad [facility] [place]. El tratamiento de las infecciones neumoccicas con penicilina y otros medicamentos era ms efectivo en el pasado. Sin embargo, algunas cepas de la bacteria que causa la enfermedad se han vuelto resistentes a estos medicamentos. Esto hace que la prevencin de la enfermedad mediante la vacunacin sea an ms importante. 2. Madilyn Fireman antineumoccica de polisacridos (PPSV23) La vacuna antineumoccica de polisacridos (PPSV23) protege contra 23tipos de bacterias neumoccicas. No previene todas las enfermedades neumoccicas. La vacuna PPSV23 se recomienda para las siguientes personas:  Todos los adultos de 65aos en adelante.  Teresita Madura persona de 2a 64aos que tenga un problema de salud a Air cabin crew.  H[employer] Corporation de 2 a 64 aos que tengan el sistema inmunitario dbil.  Los adultos de 19 a 64 aos que fumen o tengan  asma. La Harley[employer]son de las personas solo necesitan una dosis de la vacuna PPSV. Para ciertos grupos de alto riesgo se recomienda una segunda dosis. Las [employer] de 65 aos deben recibir una dosis de la vacuna, aun si recibieron una o ms dosis antes de [employer]. El mdico puede brindarle ms informacin sobre estas recomendaciones. La mayora de los adultos sanos adquiere proteccin 2 a 3semanas despus de haber recibido la vacuna. 3. Algunas personas no deben recibir la vacuna  Teresita Madura persona que haya sufrido una reaccin alrgica potencialmente mortal a la PPSV no debe recibir otra dosis.  Las personas que tengan Programmer, multimedia grave a los componentes de la vacuna PPSV no deben recibirla. Informe a su mdico si sufre de algn tipo de alergia grave.  Teresita Madura persona que est enferma en un grado moderado o grave el da en que est programada la vacunacin, probablemente deba esperar a recuperarse para recibirla. Por lo general, una persona con una enfermedad leve puede vacunarse.  Los nios menores de 2 aos no deben recibir [employer].  No hay pruebas de que la vacuna PPSV sea perjudicial para las mujeres embarazadas o el feto. No obstante, como precaucin, las mujeres que necesiten aplicarse la vacuna deben hacerlo antes de quedar embarazadas, si es posible. 4. Riesgos de [place] reaccin a la vacuna Con cualquier medicamento, incluso las vacunas, existe la posibilidad de que aparezcan efectos secundarios. Estos suelen ser leves y Geneticist, molecular por s solos, pero tambin es posible que se presenten reacciones graves. Aproximadamente la mitad de las personas que reciben la PPSV presentan efectos secundarios leves, como enrojecimiento o Art therapist donde se aplic la vacuna, que desaparecen a los [address]  Menos de 1 de cada 100personas presenta fiebre, dolores musculares o reacciones locales ms graves. Problemas que podran ocurrir despus de cualquier vacuna:  Las personas a veces se  desmayan despus de un procedimiento mdico, incluida la vacunacin. Permanecer sentado o recostado durante puede ayudar a [employer] y las lesiones causadas por las cadas. Informe al mdico si se siente mareado, tiene cambios en la visin o zumbidos en los odos.  Algunas personas sienten un dolor intenso en el hombro y tienen dificultad para mover el brazo donde se coloc la vacuna. Esto sucede con muy poca frecuencia.  Cualquier medicamento puede causar una reaccin alrgica grave. Dichas reacciones son Lynnae Sandhoff poco frecuentes con una vacuna (se calcula que menos de 1en un milln de dosis) y se producen unos minutos a unas horas despus de la vacunacin. Al igual que con cualquier Automatic Data, existe una probabilidad muy remota de que una vacuna cause una lesin grave o la [place]. La seguridad de las vacunas se controla permanentemente. Para obtener ms informacin, visite: http://floyd.org/ 5. Qu pasa si hay una reaccin grave? A qu signos debo estar atento? Est atento a la aparicin de signos preocupantes, como los de una reaccin alrgica grave, fiebre muy alta o comportamiento fuera de lo normal. Los signos de una reaccin alrgica grave pueden incluir ronchas, hinchazn de la cara y la garganta, dificultad para respirar, latidos cardacos acelerados, mareos y debilidad. Generalmente, estos comienzan entre unos pocos minutos y algunas horas despus de la vacunacin. Qu debo hacer? Si usted piensa que se trata de una reaccin alrgica grave o de otra emergencia que no puede esperar, llame al 911 o dirjase al hospital ms cercano. De lo contrario, llame al mdico. Despus, la reaccin debe informarse al Sistema de informe de eventos adversos de vacunas (Vaccine Adverse Event Reporting System, VAERS). Su mdico puede presentar este informe, o puede hacerlo usted mismo a travs del sitio web de VAERS, en www.vaers.LAgents.no, o llamando al (714)571-7520. El Sistema  de informe de eventos adversos de vacunas no brinda recomendaciones mdicas. 6. Cmo puedo obtener ms informacin?  Consulte a su mdico. Este puede darle el prospecto de la vacuna o recomendarle otras fuentes de informacin.  Comunquese con el servicio de salud de su localidad o [address].  Comunquese con los Centros para el Control y la Prevencin de Child psychotherapist for Disease Control and Prevention, CDC): ? Llame al [phone] (1-800-CDC-INFO) o ? Visite el sitio web de los CDC en PicCapture.uy Declaracin de informacin de los CDC sobre la vacuna, vacuna antineumoccica de polisacridos (24/07/2013) Esta informacin no tiene Theme park manager el consejo del mdico. Asegrese de hacerle al mdico cualquier pregunta que tenga. Document Released: 07/13/2008 Document Revised: 11/26/2017 Document Reviewed: 11/26/2017 Elsevier Interactive Patient Education  2019 Elsevier Inc.  Influenza Virus Vaccine injection (Fluarix) Qu es este medicamento? La VACUNA ANTIGRIPAL ayuda a disminuir el riesgo de contraer la influenza, tambin conocida como la gripe. La vacuna solo ayuda a protegerle contra algunas cepas de influenza. Esta vacuna no ayuda a reducir Nurse, adult de contraer influenza pandmica H1N1. Este medicamento puede ser utilizado para otros usos; si tiene alguna pregunta consulte con su proveedor de atencin mdica o con su farmacutico. MARCAS COMUNES: Fluarix, Fluzone Qu le debo informar a mi profesional de la salud antes de tomar este medicamento? Necesita saber si usted presenta alguno de los [facility] [facility] o situaciones: -trastorno de sangrado como hemofilia -fiebre o infeccin -sndrome de Guillain-Barre u otros problemas neurolgicos -problemas del [place]  inmunolgico -infeccin por el virus de la inmunodeficiencia humana (VIH) o SIDA -niveles bajos de plaquetas en la sangre -esclerosis mltiple -una reaccin alrgica o inusual a las vacunas  antigripales, a los huevos, protenas de pollo, al ltex, a la gentamicina, a otros medicamentos, alimentos, colorantes o conservantes -si est embarazada o buscando quedar embarazada -si est amamantando a un beb Cmo debo utilizar este medicamento? Esta vacuna se administra mediante inyeccin por va intramuscular. Lo administra un profesional de Beazer Homesla salud. Recibir una copia de informacin escrita sobre la vacuna antes de cada vacuna. Asegrese de leer este folleto cada vez cuidadosamente. Este folleto puede cambiar con frecuencia. Hable con su pediatra para informarse acerca del uso de este medicamento en nios. Puede requerir atencin especial. Sobredosis: Pngase en contacto inmediatamente con un centro toxicolgico o una sala de urgencia si usted cree que haya tomado demasiado medicamento. ATENCIN: Reynolds AmericanEste medicamento es solo para usted. No comparta este medicamento con nadie. Qu sucede si me olvido de una dosis? No se aplica en este caso. Qu puede interactuar con este medicamento? -quimioterapia o radioterapia -medicamentos que suprimen el sistema inmunolgico, tales como etanercept, anakinra, infliximab y adalimumab -medicamentos que tratan o previenen cogulos sanguneos, como warfarina -fenitona -medicamentos esteroideos, como la prednisona o la cortisona -teofilina -vacunas Puede ser que esta lista no menciona todas las posibles interacciones. Informe a su profesional de Beazer Homesla salud de [employer] Inctodos los productos a base de hierbas, medicamentos de Castorventa libre o suplementos nutritivos que est tomando. Si usted fuma, consume bebidas alcohlicas o si utiliza drogas ilegales, indqueselo tambin a su profesional de Beazer Homesla salud. Algunas sustancias pueden interactuar con su medicamento. A qu debo estar atento al usar PPL Corporationeste medicamento? Informe a su mdico o a Producer, television/film/videosu profesional de la Dollar Generalsalud sobre todos los efectos secundarios que persistan despus de [address] das. Llame a su proveedor de atencin mdica si  se presentan sntomas inusuales dentro de las 6 semanas posteriores a la vacunacin. Es posible que todava pueda contraer la gripe, pero la enfermedad no ser tan fuerte como normalmente. No puede contraer la gripe de esta vacuna. La vacuna antigripal no le protege contra resfros u otras enfermedades que pueden causar Morafiebre. Debe vacunarse cada ao. Qu efectos secundarios puedo tener al Boston Scientificutilizar este medicamento? Efectos secundarios que debe informar a su mdico o a Producer, television/film/videosu profesional de la salud tan pronto como sea posible: -reacciones alrgicas como erupcin cutnea, picazn o urticarias, hinchazn de la cara, labios o lengua Efectos secundarios que, por lo general, no requieren atencin mdica (debe informarlos a su mdico o a su profesional de la salud si persisten o si son molestos): -fiebre -dolor de cabeza -molestias y dolores musculares -dolor, sensibilidad, enrojecimiento o Paramedichinchazn en el lugar de la inyeccin -cansancio o debilidad Puede ser que esta lista no menciona todos los posibles efectos secundarios. Comunquese a su mdico por asesoramiento mdico Hewlett-Packardsobre los efectos secundarios. Usted puede informar los efectos secundarios a la FDA por telfono al 1-800-FDA-1088. Dnde debo guardar mi medicina? Esta vacuna se administra solamente en clnicas, farmacias, consultorio mdico u otro consultorio de un profesional de la salud y no Teacher, early years/prenecesitar guardarlo en su domicilio. ATENCIN: Este folleto es un resumen. Puede ser que no cubra toda la posible informacin. Si usted tiene preguntas acerca de esta medicina, consulte con su mdico, su farmacutico o su profesional de Radiographer, therapeuticla salud.  2019 Elsevier/Gold Standard (2009-10-18 15:31:40)   Hepatitis A; Hepatitis B Vaccine injection What is this medicine? HEPATITIS A VACCINE; HEPATITIS B VACCINE (hep uh  TAHY tis A vak SEEN; hep uh TAHY tis B vak SEEN) is a vaccine to protect from an infection with the hepatitis A and B virus. This vaccine does  not contain the live viruses. It will not cause a hepatitis infection. This medicine may be used for other purposes; ask your health care provider or pharmacist if you have questions. COMMON BRAND NAME(S): Twinrix What should I tell my health care provider before I take this medicine? They need to know if you have any of these conditions: -bleeding disorder -fever or infection -heart disease -immune system problems -an unusual or allergic reaction to hepatitis A or B vaccine, neomycin, yeast, thimerosal, other medicines, foods, dyes, or preservatives -pregnant or trying to get pregnant -breast-feeding How should I use this medicine? This vaccine is for injection into a muscle. It is given by a health care professional. A copy of Vaccine Information Statements will be given before each vaccination. Read this sheet carefully each time. The sheet may change frequently. Talk to your pediatrician regarding the use of this medicine in children. Special care may be needed. Overdosage: If you think you have taken too much of this medicine contact a poison control center or emergency room at once. NOTE: This medicine is only for you. Do not share this medicine with others. What if I miss a dose? It is important not to miss your dose. Call your doctor or health care professional if you are unable to keep an appointment. What may interact with this medicine? -medicines that suppress your immune function like adalimumab, anakinra, infliximab -medicines to treat cancer -steroid medicines like prednisone or cortisone This list may not describe all possible interactions. Give your health care provider a list of all the medicines, herbs, non-prescription drugs, or dietary supplements you use. Also tell them if you smoke, drink alcohol, or use illegal drugs. Some items may interact with your medicine. What should I watch for while using this medicine? See your health care provider for all shots of this  vaccine as directed. You must have 3 to 4 shots of this vaccine for protection from hepatitis A and B infection. Tell your doctor right away if you have any serious or unusual side effects after getting this vaccine. What side effects may I notice from receiving this medicine? Side effects that you should report to your doctor or health care professional as soon as possible: -allergic reactions like skin rash, itching or hives, swelling of the face, lips, or tongue -breathing problems -confused, irritated -fast, irregular heartbeat -flu-like syndrome -numb, tingling pain -seizures Side effects that usually do not require medical attention (report to your doctor or health care professional if they continue or are bothersome): -diarrhea -fever -headache -loss of appetite -muscle pain -nausea -pain, redness, swelling, or irritation at site where injected -tiredness This list may not describe all possible side effects. Call your doctor for medical advice about side effects. You may report side effects to FDA at 1-800-FDA-1088. Where should I keep my medicine? This drug is given in a hospital or clinic and will not be stored at home. NOTE: This sheet is a summary. It may not cover all possible information. If you have questions about this medicine, talk to your doctor, pharmacist, or health care provider.  2019 Elsevier/Gold Standard (2007-08-29 15:21:37)

## 2018-06-10 NOTE — Telephone Encounter (Signed)
Patient called because he was not sure when he will be able to go back to work and wanted to know if all the medications he Is to pickup if he should be taking all. Please follow up.

## 2018-06-10 NOTE — Telephone Encounter (Signed)
Will forward to pcp
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
## 2018-06-11 LAB — CBC
Hematocrit: 29.8 % — ABNORMAL LOW (ref 37.5–51.0)
Hemoglobin: 11.5 g/dL — ABNORMAL LOW (ref 13.0–17.7)
MCH: 35.5 pg — ABNORMAL HIGH (ref 26.6–33.0)
MCHC: 38.6 g/dL — ABNORMAL HIGH (ref 31.5–35.7)
MCV: 92 fL (ref 79–97)
Platelets: 35 10*3/uL — CL (ref 150–450)
RBC: 3.24 x10E6/uL — ABNORMAL LOW (ref 4.14–5.80)
RDW: 12.9 % (ref 11.6–15.4)
WBC: 7.3 10*3/uL (ref 3.4–10.8)

## 2018-06-11 LAB — COMPREHENSIVE METABOLIC PANEL
A/G RATIO: 0.9 — AB (ref 1.2–2.2)
ALT: 56 IU/L — ABNORMAL HIGH (ref 0–44)
AST: 98 IU/L — ABNORMAL HIGH (ref 0–40)
Albumin: 3.1 g/dL — ABNORMAL LOW (ref 4.0–5.0)
Alkaline Phosphatase: 184 IU/L — ABNORMAL HIGH (ref 39–117)
BUN/Creatinine Ratio: 17 (ref 9–20)
BUN: 12 mg/dL (ref 6–20)
Bilirubin Total: 6.6 mg/dL — ABNORMAL HIGH (ref 0.0–1.2)
CO2: 15 mmol/L — ABNORMAL LOW (ref 20–29)
Calcium: 8.9 mg/dL (ref 8.7–10.2)
Chloride: 99 mmol/L (ref 96–106)
Creatinine, Ser: 0.72 mg/dL — ABNORMAL LOW (ref 0.76–1.27)
GFR calc Af Amer: 140 mL/min/{1.73_m2} (ref >59)
GFR calc non Af Amer: 121 mL/min/{1.73_m2} (ref >59)
Globulin, Total: 3.6 g/dL (ref 1.5–4.5)
Glucose: 152 mg/dL — ABNORMAL HIGH (ref 65–99)
POTASSIUM: 4.5 mmol/L (ref 3.5–5.2)
SODIUM: 128 mmol/L — AB (ref 134–144)
Total Protein: 6.7 g/dL (ref 6.0–8.5)

## 2018-06-13 ENCOUNTER — Ambulatory Visit: Payer: Self-pay | Admitting: Family Medicine

## 2018-06-13 NOTE — Telephone Encounter (Signed)
Contacted pt to go over Dr. Johnson message pt didn't answer and was unable to lvm  

## 2018-06-17 NOTE — Telephone Encounter (Signed)
Will forward to pcp

## 2018-06-17 NOTE — Telephone Encounter (Signed)
Pt Called back  Routed pcps message  Pt understood he states he does [employer] work [employer]

## 2018-06-18 NOTE — Telephone Encounter (Signed)
Could you please contact pt and make them aware

## 2018-06-19 MED FILL — LACTULOSE 10 GM/15 ML SOLN: 10 | 7 days supply | Qty: 473 | Fill #1

## 2018-07-01 ENCOUNTER — Telehealth: Payer: Self-pay | Admitting: Internal Medicine

## 2018-07-01 ENCOUNTER — Ambulatory Visit: Payer: Self-pay | Attending: Family Medicine

## 2018-07-01 MED FILL — LACTULOSE 10 GM/15 ML SOLN: 10 | 7 days supply | Qty: 473 | Fill #2

## 2018-07-01 NOTE — Telephone Encounter (Signed)
Patient called twice due to telephone number issues with a need for advise.  Patient states his stomach has been getting bigger.  Patient states he started noticing it in the last week or so.  Patient states that it is 0.5 to 0.75 inches bigger than normal.  Patient states he has been compliant with his medications and has changed his diet and salt intake.  Patient endorses more food intake and no alcohol use. Patient also endorses swelling of the right hand.   Patient denies pedal edema.

## 2018-07-01 NOTE — Telephone Encounter (Signed)
Patient called because he is concerned about weight gain. Patient states he is unsure if it could be water retention or what could be going on. Please follow up.

## 2018-07-03 ENCOUNTER — Encounter: Payer: Self-pay | Admitting: Internal Medicine

## 2018-07-03 ENCOUNTER — Ambulatory Visit: Payer: Self-pay | Attending: Internal Medicine | Admitting: Internal Medicine

## 2018-07-03 VITALS — BP 117/73 | HR 79 | Temp 98.7°F | Resp 16 | Wt 209.6 lb

## 2018-07-03 DIAGNOSIS — K7031 Alcoholic cirrhosis of liver with ascites: Secondary | ICD-10-CM

## 2018-07-03 MED FILL — PANTOPRAZOLE SOD DR 40 MG T: 40 | 30 days supply | Qty: 30 | Fill #1

## 2018-07-03 MED FILL — CIPROFLOXACIN HCL 250 MG TA: 250 | 28 days supply | Qty: 12 | Fill #1

## 2018-07-03 MED FILL — SPIRONOLACTONE 100 MG TAB: 100 | 30 days supply | Qty: 30 | Fill #1

## 2018-07-03 MED FILL — FUROSEMIDE 40 MG TAB: 40 | 30 days supply | Qty: 30 | Fill #1
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
## 2018-07-04 ENCOUNTER — Ambulatory Visit: Payer: Self-pay | Admitting: Internal Medicine

## 2018-07-04 LAB — CBC WITH DIFFERENTIAL
BASOS ABS: 0.1 10*3/uL (ref 0.0–0.2)
BASOS: 1 %
EOS (ABSOLUTE): 0.2 10*3/uL (ref 0.0–0.4)
Eos: 3 %
Hemoglobin: 11.6 g/dL — ABNORMAL LOW (ref 13.0–17.7)
Immature Grans (Abs): 0 10*3/uL (ref 0.0–0.1)
Immature Granulocytes: 1 %
Lymphocytes Absolute: 1.8 10*3/uL (ref 0.7–3.1)
Lymphs: 30 %
Monocytes Absolute: 0.9 10*3/uL (ref 0.1–0.9)
Monocytes: 15 %
Neutrophils Absolute: 3.2 10*3/uL (ref 1.4–7.0)
Neutrophils: 50 %
WBC: 6.1 10*3/uL (ref 3.4–10.8)

## 2018-07-04 LAB — COMPREHENSIVE METABOLIC PANEL
ALK PHOS: 212 IU/L — AB (ref 39–117)
ALT: 48 IU/L — ABNORMAL HIGH (ref 0–44)
AST: 93 IU/L — ABNORMAL HIGH (ref 0–40)
Albumin/Globulin Ratio: 0.9 — ABNORMAL LOW (ref 1.2–2.2)
Albumin: 3.1 g/dL — ABNORMAL LOW (ref 4.0–5.0)
BUN/Creatinine Ratio: 15 (ref 9–20)
BUN: 11 mg/dL (ref 6–20)
Bilirubin Total: 5 mg/dL — ABNORMAL HIGH (ref 0.0–1.2)
CHLORIDE: 100 mmol/L (ref 96–106)
CO2: 19 mmol/L — ABNORMAL LOW (ref 20–29)
Calcium: 8.7 mg/dL (ref 8.7–10.2)
Creatinine, Ser: 0.73 mg/dL — ABNORMAL LOW (ref 0.76–1.27)
GFR calc Af Amer: 139 mL/min/{1.73_m2} (ref >59)
GFR calc non Af Amer: 120 mL/min/{1.73_m2} (ref >59)
Globulin, Total: 3.6 g/dL (ref 1.5–4.5)
Glucose: 99 mg/dL (ref 65–99)
POTASSIUM: 4.7 mmol/L (ref 3.5–5.2)
Sodium: 131 mmol/L — ABNORMAL LOW (ref 134–144)
Total Protein: 6.7 g/dL (ref 6.0–8.5)

## 2018-07-09 ENCOUNTER — Other Ambulatory Visit: Payer: Self-pay

## 2018-07-09 ENCOUNTER — Ambulatory Visit: Payer: Self-pay | Attending: Family Medicine | Admitting: Emergency Medicine

## 2018-07-09 DIAGNOSIS — K7031 Alcoholic cirrhosis of liver with ascites: Secondary | ICD-10-CM

## 2018-07-09 DIAGNOSIS — R600 Localized edema: Secondary | ICD-10-CM

## 2018-07-09 DIAGNOSIS — Z23 Encounter for immunization: Secondary | ICD-10-CM

## 2018-07-09 NOTE — Progress Notes (Signed)
Patient arrived ambulatory, alert and orientated to clinic.  Patient is in clinic for his second injection of Hepatitis B vaccine.  Patient was injection in the left deltoid.  Patient was given Hep B VIS.  Patient told possible sie effects.  Patient acknowledged understanding.

## 2018-07-14 ENCOUNTER — Telehealth: Payer: Self-pay | Admitting: Internal Medicine

## 2018-07-14 NOTE — Telephone Encounter (Signed)
Pt states several rashes appeared on right hand and on left hand below elbow as well states they are itchy and isn't sure if its a medication reaction. Pt also states that his stomach is still growing but having no pain please follow up

## 2018-07-15 NOTE — Telephone Encounter (Signed)
chabely could you contact pt

## 2018-07-15 NOTE — Telephone Encounter (Signed)
Will forward to pcp

## 2018-07-31 ENCOUNTER — Encounter: Payer: Self-pay | Admitting: Critical Care Medicine

## 2018-07-31 ENCOUNTER — Other Ambulatory Visit: Payer: Self-pay

## 2018-07-31 ENCOUNTER — Ambulatory Visit: Payer: Self-pay | Attending: Critical Care Medicine | Admitting: Critical Care Medicine

## 2018-07-31 DIAGNOSIS — E669 Obesity, unspecified: Secondary | ICD-10-CM

## 2018-07-31 DIAGNOSIS — K7210 Chronic hepatic failure without coma: Secondary | ICD-10-CM

## 2018-07-31 DIAGNOSIS — K7011 Alcoholic hepatitis with ascites: Secondary | ICD-10-CM

## 2018-07-31 DIAGNOSIS — K7290 Hepatic failure, unspecified without coma: Secondary | ICD-10-CM

## 2018-07-31 DIAGNOSIS — F1010 Alcohol abuse, uncomplicated: Secondary | ICD-10-CM

## 2018-07-31 DIAGNOSIS — K7031 Alcoholic cirrhosis of liver with ascites: Secondary | ICD-10-CM

## 2018-07-31 DIAGNOSIS — D696 Thrombocytopenia, unspecified: Secondary | ICD-10-CM

## 2018-07-31 DIAGNOSIS — Z6841 Body Mass Index (BMI) 40.0 and over, adult: Secondary | ICD-10-CM

## 2018-07-31 DIAGNOSIS — K652 Spontaneous bacterial peritonitis: Secondary | ICD-10-CM

## 2018-07-31 MED ORDER — FUROSEMIDE 40 MG PO TABS: 40.0000 mg | ORAL_TABLET | Freq: Two times a day (BID) | ORAL | 5 refills | Status: DC

## 2018-07-31 MED FILL — CIPROFLOXACIN HCL 250 MG TA: 250 | 28 days supply | Qty: 12 | Fill #2

## 2018-07-31 MED FILL — SPIRONOLACTONE 100 MG TAB: 100 | 30 days supply | Qty: 30 | Fill #2

## 2018-07-31 MED FILL — LACTULOSE 10 GM/15 ML SOLN: 10 | 7 days supply | Qty: 473 | Fill #3

## 2018-07-31 MED FILL — PANTOPRAZOLE SOD DR 40 MG T: 40 | 30 days supply | Qty: 30 | Fill #2

## 2018-07-31 MED FILL — FUROSEMIDE 40 MG TAB: 40 | 30 days supply | Qty: 60 | Fill #0

## 2018-07-31 NOTE — Progress Notes (Signed)
Virtual Visit via Telephone Note  I connected with Jason [name] on 07/31/18 at  2:00 PM EDT by telephone and verified that I am speaking with the correct person using two identifiers.   I discussed the limitations, risks, security and privacy concerns of performing an evaluation and management service by telephone and the availability of in person appointments. I also discussed with the patient that there may be a patient responsible charge related to this service. The patient expressed understanding and agreed to proceed.   History of Present Illness:  This is a 38 year old Latino male with history of alcoholic liver disease with ascites and portal hypertension.  Patient has associated alcoholic cirrhosis.  Note this patient was previously admitted in January for spontaneous bacterial peritonitis and has had 3 follow-up office visits including one with gastroenterology. The follow-up per gastroenterology in January was as follows: #1.  EtOH Cirrhosis: Recent admission for AMS in the setting of EtOH cirrhosis with ?  Superimposed Alcoholic Hepatitis along with sepsis/bacteremia.  Overall, he is made a significant clinical improvement and presents today determined to quit EtOH and start a road to long-term recovery.  We will plan on further evaluation and treatment as below:  - EGD at [facility] for EV screening -Resume Lasix and Aldactone -Resume lactulose - Starting ciprofloxacin for SBP prophylaxis per ID recommendations - Check serologies for concomittant liver disease - Flu and Pneumo vaccines today - Check viral hep studies and vaccinate as appropriate - MRI Liver for [facility] screening and evaluation of previously noted subcentimeter nodules on admission CT.  To be done in 3 months - Check Ethyl Gluc on f/u appt -Plan for Hepatology referral if still abstinent on f/u appt and pending repeat MELD labs - Low Na diet handout -Provided assistance for linking him with local AA  programs - RTC in 2-3 months or sooner prn  #2.  Bacteremia: Completed antimicrobial course per ID recommendations.  No recurrence of confusion.  Bacteremia was secondary to opportunistic infection.  Per lit search, associated with EtOH, liver disease.  HIV negative as inpatient.  #3.  Ascites: No appreciable ascites on exam today.  Continues to take diuretics as prescribed above.  #4.  LE Edema: Does have trace LE bilateral edema.  Likely secondary to dietary indiscretion. -Provided with  Low Na handout -Resume diuretics as above - Checking CHEM panel today.  Start K+ supplement as appropriate pending labs   It does not appear the patient had the endoscopy as ordered.  The patient states he is still having abdominal swelling but it is not as tense as it was.  There is no edema in the feet.  There is no cough.  There is minimal shortness of breath and some fatigue.  There is no fever.  The patient maintains weekly Cipro.  The patient is on 40 mg of Lasix daily and 100 mg of Aldactone daily.  Patient maintains Chronulac and is having 3-5 bowel movements daily.  There is no change in mental status.  His weight is up to 212 pounds from 209 pounds in the last visit in March.  ROS: Pos in BOLD Constitutional:   No  weight loss, night sweats,  Fevers, chills, fatigue, lassitude. HEENT:   No headaches,  Difficulty swallowing,  Tooth/dental problems,  Sore throat,                No sneezing, itching, ear ache, nasal congestion, post nasal drip,   CV:  No chest pain,  Orthopnea,  PND, swelling in lower extremities, anasarca, dizziness, palpitations  GI  No heartburn, indigestion, abdominal pain, nausea, vomiting, diarrhea, change in bowel habits, loss of appetite  Resp: shortness of breath with exertion not at rest.  No excess mucus, no productive cough,  No non-productive cough,  No coughing up of blood.  No change in color of mucus.  No wheezing.  No chest wall deformity  Skin: no rash or  lesions.  GU: no dysuria, change in color of urine, no urgency or frequency.  No flank pain.  MS:  No joint pain or swelling.  No decreased range of motion.  No back pain.  Psych:  No change in mood or affect. No depression or anxiety.  No memory loss.   Observations/Objective: As this was a telephone visit there are no observations  Assessment and Plan: #1 alcoholic cirrhosis with ascites which appears to be stable at this time however there is still fluid retention on the abdomen.  Note potassium levels at the last visit was 4.7  Plan would be for the patient to increase furosemide to 40 mg twice daily.  The patient will continue current dosage of Aldactone at 100 mg daily and current potassium supplementation.  Ciprofloxacin weekly will be maintained along with folic acid, thiamine, vitamins, and lactulose.  The patient will also maintain the Protonix.  It would be good to have gastroenterology follow this patient up as needed determine whether a upper endoscopy needs to be performed  Follow Up Instructions: The patient's prescription change for the furosemide was sent to our pharmacy and he is to return to see [place] in 1 month   I discussed the assessment and treatment plan with the patient. The patient was provided an opportunity to ask questions and all were answered. The patient agreed with the plan and demonstrated an understanding of the instructions.   The patient was advised to call back or seek an in-person evaluation if the symptoms worsen or if the condition fails to improve as anticipated.  I provided 30 minutes of non-face-to-face time during this encounter.   Shan Levans, MD

## 2018-08-04 ENCOUNTER — Other Ambulatory Visit: Payer: Self-pay

## 2018-08-04 ENCOUNTER — Ambulatory Visit (HOSPITAL_COMMUNITY)
Admission: RE | Admit: 2018-08-04 | Discharge: 2018-08-04 | Disposition: A | Discharge status: Home / Self Care | Payer: Self-pay | Source: Ambulatory Visit | Attending: Gastroenterology | Admitting: Gastroenterology

## 2018-08-04 DIAGNOSIS — K7031 Alcoholic cirrhosis of liver with ascites: Secondary | ICD-10-CM | POA: Insufficient documentation

## 2018-08-04 DIAGNOSIS — R600 Localized edema: Secondary | ICD-10-CM | POA: Insufficient documentation

## 2018-08-04 MED ORDER — GADOBUTROL 1 MMOL/ML IV SOLN
10.0000 mL | Freq: Once | INTRAVENOUS | Status: AC | PRN
  Administered 2018-08-04: 11:00:00 10 mL via INTRAVENOUS

## 2018-08-06 ENCOUNTER — Other Ambulatory Visit: Payer: Self-pay

## 2018-08-06 ENCOUNTER — Telehealth (INDEPENDENT_AMBULATORY_CARE_PROVIDER_SITE_OTHER): Payer: Self-pay | Admitting: Gastroenterology

## 2018-08-06 ENCOUNTER — Encounter: Payer: Self-pay | Admitting: Gastroenterology

## 2018-08-06 VITALS — Ht 67.0 in | Wt 202.0 lb

## 2018-08-06 DIAGNOSIS — R600 Localized edema: Secondary | ICD-10-CM

## 2018-08-06 DIAGNOSIS — K7460 Unspecified cirrhosis of liver: Secondary | ICD-10-CM

## 2018-08-06 DIAGNOSIS — K7031 Alcoholic cirrhosis of liver with ascites: Secondary | ICD-10-CM

## 2018-08-06 DIAGNOSIS — K7682 Hepatic encephalopathy: Secondary | ICD-10-CM

## 2018-08-06 DIAGNOSIS — K7290 Hepatic failure, unspecified without coma: Secondary | ICD-10-CM

## 2018-08-06 DIAGNOSIS — K766 Portal hypertension: Secondary | ICD-10-CM

## 2018-08-06 DIAGNOSIS — F1021 Alcohol dependence, in remission: Secondary | ICD-10-CM

## 2018-08-06 DIAGNOSIS — K769 Liver disease, unspecified: Secondary | ICD-10-CM

## 2018-08-06 NOTE — Patient Instructions (Signed)
-  Continue current medications as prescribed - Repeat labs in 3 months to include CMP, INR, AFP -Check micronutrient labs in 3 months to include zinc, thiamine, B12, folate, iron panel with ferritin -Repeat MRI three-phase liver in 3 to 6 months -Continue complete cessation of all alcohol
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
## 2018-08-21 MED FILL — PANTOPRAZOLE SOD DR 40 MG T: 40 | 60 days supply | Qty: 60 | Fill #3

## 2018-08-21 MED FILL — CIPROFLOXACIN HCL 250 MG TA: 250 | 56 days supply | Qty: 24 | Fill #3

## 2018-08-21 MED FILL — FUROSEMIDE 40 MG TAB: 40 | 30 days supply | Qty: 60 | Fill #1

## 2018-08-21 MED FILL — SPIRONOLACTONE 100 MG TAB: 100 | 30 days supply | Qty: 30 | Fill #3

## 2018-08-21 MED FILL — LACTULOSE 10 GM/15 ML SOLN: 10 | 14 days supply | Qty: 946 | Fill #4

## 2018-08-25 ENCOUNTER — Encounter: Payer: Self-pay | Admitting: Internal Medicine

## 2018-08-25 ENCOUNTER — Ambulatory Visit: Payer: Self-pay | Attending: Internal Medicine | Admitting: Internal Medicine

## 2018-08-25 ENCOUNTER — Other Ambulatory Visit: Payer: Self-pay

## 2018-08-25 DIAGNOSIS — K7290 Hepatic failure, unspecified without coma: Secondary | ICD-10-CM

## 2018-08-25 DIAGNOSIS — K7031 Alcoholic cirrhosis of liver with ascites: Secondary | ICD-10-CM

## 2018-08-25 MED ORDER — POTASSIUM CHLORIDE CRYS ER 20 MEQ PO TBCR: 40.0000 meq | EXTENDED_RELEASE_TABLET | Freq: Every day | ORAL | 11 refills | Status: DC

## 2018-08-25 MED ORDER — ONDANSETRON HCL 4 MG PO TABS: 4.0000 mg | ORAL_TABLET | Freq: Four times a day (QID) | ORAL | 1 refills | Status: DC | PRN

## 2018-08-25 MED ORDER — FOLIC ACID 1 MG PO TABS: 1.0000 mg | ORAL_TABLET | Freq: Every day | ORAL | 1 refills | Status: DC

## 2018-08-25 MED FILL — POTASSIUM CL ER 20 MEQ TAB: 20 | 30 days supply | Qty: 60 | Fill #0

## 2018-08-25 MED FILL — FOLIC ACID 1 MG TABS: 1 | 30 days supply | Qty: 30 | Fill #0

## 2018-08-25 MED FILL — ONDANSETRON HCL 4 MG TABLET: 4 | 5 days supply | Qty: 20 | Fill #0

## 2018-08-25 NOTE — Progress Notes (Signed)
Virtual Visit via Telephone Note  I connected with Jason [name] on 08/25/18 at 8:47 a.m by telephone  from my office and verified that I am speaking with the correct person using two identifiers.  The patient is at home. [employer] use:  # [id number]   I discussed the limitations, risks, security and privacy concerns of performing an evaluation and management service by telephone and the availability of in person appointments. I also discussed with the patient that there may be a patient responsible charge related to this service. The patient expressed understanding and agreed to proceed.   History of Present Illness: Patient with history of severe alcohol use disorder, alcohol induced cirrhosis, SBP   Pt had telemed visit with GI Dr. Barron Alvine 08/06/2018 Slight increase in abdomen girth.  No abdominal pain.  No blood in stools Reports good appetite. Nausea occasionally.  Needs RF on Zofran No LE edema.  Limits salt in take. Remains free of ETOH Confirms compliance with all meds including once weekly Cipro He has had 2/3 shots in hep B vaccine series.  Observations/Objective: Lab Results  Component Value Date   WBC 6.1 07/03/2018   HGB 11.6 (L) 07/03/2018   HCT CANCELED 07/03/2018   MCV 92 06/10/2018   PLT 35 (LL) 06/10/2018     Chemistry      Component Value Date/Time   NA 131 (L) 07/03/2018 1107   K 4.7 07/03/2018 1107   CL 100 07/03/2018 1107   CO2 19 (L) 07/03/2018 1107   BUN 11 07/03/2018 1107   CREATININE 0.73 (L) 07/03/2018 1107      Component Value Date/Time   CALCIUM 8.7 07/03/2018 1107   ALKPHOS 212 (H) 07/03/2018 1107   AST 93 (H) 07/03/2018 1107   ALT 48 (H) 07/03/2018 1107   BILITOT 5.0 (H) 07/03/2018 1107       Assessment and Plan: 1. Alcoholic cirrhosis of liver with ascites (HCC) 2. Decompensated hepatic cirrhosis (HCC) Dr. Barron Alvine note reviewed.  Plan per his note: - Plan for EGD at [facility] for EV screening once able to  schedule pending COVID-19 endoscopy restrictions.  Plan to be done at [facility] given radiographic evidence of varices and elevated MELD and Child Pugh scores - Repeat labs in 3 months.  If still with elevated MELD and still maintaining EtOH abstinence, plan for referral to Hepatology Clinic  Plan is to continue current meds Cont low salt diet Commended him to remain ETOH free  - ondansetron (ZOFRAN) 4 MG tablet; Take 1 tablet (4 mg total) by mouth every 6 (six) hours as needed for nausea or vomiting.  Dispense: 20 tablet; Refill: 1 - folic acid (FOLVITE) 1 MG tablet; Take 1 tablet (1 mg total) by mouth daily.  Dispense: 100 tablet; Refill: 1 - potassium chloride SA (K-DUR) 20 MEQ tablet; Take 2 tablets (40 mEq total) by mouth daily.  Dispense: 60 tablet; Refill: 11   Follow Up Instructions: F/u in 2 mths RFs sent to [facility] pharmacy.   I discussed the assessment and treatment plan with the patient. The patient was provided an opportunity to ask questions and all were answered. The patient agreed with the plan and demonstrated an understanding of the instructions.   The patient was advised to call back or seek an in-person evaluation if the symptoms worsen or if the condition fails to improve as anticipated.  I provided 16 minutes of non-face-to-face time during this encounter.   Jonah Blue, MD

## 2018-09-10 ENCOUNTER — Other Ambulatory Visit: Payer: Self-pay | Admitting: Internal Medicine

## 2018-09-10 DIAGNOSIS — K7031 Alcoholic cirrhosis of liver with ascites: Secondary | ICD-10-CM

## 2018-09-10 MED FILL — LACTULOSE 10 GM/15 ML SOLN: 10 | 30 days supply | Qty: 1419 | Fill #0

## 2018-10-06 MED FILL — LACTULOSE 10 GM/15 ML SOLN: 10 | 30 days supply | Qty: 1419 | Fill #1

## 2018-10-06 MED FILL — SPIRONOLACTONE 100 MG TAB: 100 | 30 days supply | Qty: 30 | Fill #4

## 2018-10-06 MED FILL — FOLIC ACID 1 MG TABS: 1 | 30 days supply | Qty: 30 | Fill #1

## 2018-10-06 MED FILL — FUROSEMIDE 40 MG TAB: 40 | 30 days supply | Qty: 60 | Fill #2

## 2018-10-06 MED FILL — POTASSIUM CL ER 20 MEQ TAB: 20 | 30 days supply | Qty: 60 | Fill #1

## 2018-10-06 MED FILL — ONDANSETRON HCL 4 MG TABLET: 4 | 5 days supply | Qty: 20 | Fill #1

## 2018-10-20 ENCOUNTER — Telehealth: Payer: Self-pay | Admitting: Internal Medicine

## 2018-10-20 ENCOUNTER — Other Ambulatory Visit: Payer: Self-pay

## 2018-10-20 ENCOUNTER — Encounter: Payer: Self-pay | Admitting: Internal Medicine

## 2018-10-20 ENCOUNTER — Ambulatory Visit: Payer: Self-pay | Attending: Internal Medicine | Admitting: Internal Medicine

## 2018-10-20 DIAGNOSIS — R438 Other disturbances of smell and taste: Secondary | ICD-10-CM

## 2018-10-20 DIAGNOSIS — Z20828 Contact with and (suspected) exposure to other viral communicable diseases: Secondary | ICD-10-CM

## 2018-10-20 DIAGNOSIS — R05 Cough: Secondary | ICD-10-CM

## 2018-10-20 DIAGNOSIS — R51 Headache: Secondary | ICD-10-CM

## 2018-10-20 DIAGNOSIS — Z20822 Contact with and (suspected) exposure to covid-19: Secondary | ICD-10-CM

## 2018-10-20 DIAGNOSIS — M791 Myalgia, unspecified site: Secondary | ICD-10-CM

## 2018-10-20 NOTE — Progress Notes (Signed)
Virtual Visit via Telephone Note Due to current restrictions/limitations of in-office visits due to the COVID-19 pandemic, this scheduled clinical appointment was converted to a telehealth visit  I connected with Jason [name] on 10/20/18 at 3:00 p.m EDT by telephone and verified that I am speaking with the correct person using two identifiers. I am in my office.  The patient is at home.  Only the patient, myself and Irving ShowsMiguel [phone]([id number]) from [facility] participated in this encounter.  I discussed the limitations, risks, security and privacy concerns of performing an evaluation and management service by telephone and the availability of in person appointments. I also discussed with the patient that there may be a patient responsible charge related to this service. The patient expressed understanding and agreed to proceed.  History of Present Illness: Patient with history of severe alcohol use disorder, alcohol induced cirrhosis, SBP.  Patient last evaluated 07/2018.  Pt c/o myalgias, mild productive cough, and HA that started yesterday. Some SOB yesterday when outside in the heat but not today.  + loss of taste -reports he cough about 3-4 x yesterday and less today.  "it has not been a constant cough." -no fever or sore throat. -lives with brother.  Brother has similar symptoms x 3-5 days. Brother had slight fever.   They work Holiday representativeconstruction together. -no known COVID contacts for either of them    Outpatient Encounter Medications as of 10/20/2018  Medication Sig  . acidophilus (RISAQUAD) CAPS capsule Take 1 capsule by mouth daily.  . folic acid (FOLVITE) 1 MG tablet Take 1 tablet (1 mg total) by mouth daily.  . furosemide (LASIX) 40 MG tablet Take 1 tablet (40 mg total) by mouth 2 (two) times daily.  Marland Kitchen. lactulose, encephalopathy, (CHRONULAC) 10 GM/15ML SOLN TAKE 30 MLS (20 G TOTAL) BY MOUTH 2 (TWO) TIMES DAILY.  . Multiple Vitamin (MULTIVITAMIN WITH MINERALS) TABS tablet Take  1 tablet by mouth daily.  . ondansetron (ZOFRAN) 4 MG tablet Take 1 tablet (4 mg total) by mouth every 6 (six) hours as needed for nausea or vomiting.  . pantoprazole (PROTONIX) 40 MG tablet Take 1 tablet (40 mg total) by mouth daily before lunch.  . potassium chloride SA (K-DUR) 20 MEQ tablet Take 2 tablets (40 mEq total) by mouth daily.  Marland Kitchen. spironolactone (ALDACTONE) 100 MG tablet Take 1 tablet (100 mg total) by mouth daily.  Marland Kitchen. thiamine 100 MG tablet Take 1 tablet (100 mg total) by mouth daily.  . [DISCONTINUED] ciprofloxacin (CIPRO) 250 MG tablet Take 1 tablet (250 mg total) by mouth once a week. Take 3 tablets every Monday for spontaneous bacterial pertonitis prophyl axis   No facility-administered encounter medications on file as of 10/20/2018.     Observations/Objective:  Results for orders placed or performed in visit on 07/03/18  CBC With Differential  Result Value Ref Range   WBC 6.1 3.4 - 10.8 x10E3/uL   RBC CANCELED    Hemoglobin 11.6 (L) 13.0 - 17.7 g/dL   Hematocrit CANCELED    Neutrophils 50 Not Estab. %   Lymphs 30 Not Estab. %   Monocytes 15 Not Estab. %   Eos 3 Not Estab. %   Basos 1 Not Estab. %   Neutrophils Absolute 3.2 1.4 - 7.0 x10E3/uL   Lymphocytes Absolute 1.8 0.7 - 3.1 x10E3/uL   Monocytes Absolute 0.9 0.1 - 0.9 x10E3/uL   EOS (ABSOLUTE) 0.2 0.0 - 0.4 x10E3/uL   Basophils Absolute 0.1 0.0 - 0.2 x10E3/uL   Immature  Granulocytes 1 Not Estab. %   Immature Grans (Abs) 0.0 0.0 - 0.1 x10E3/uL   Hematology Comments: Note:   Comprehensive metabolic panel  Result Value Ref Range   Glucose 99 65 - 99 mg/dL   BUN 11 6 - 20 mg/dL   Creatinine, Ser 0.73 (L) 0.76 - 1.27 mg/dL   GFR calc non Af Amer 120 >59 mL/min/1.73   GFR calc Af Amer 139 >59 mL/min/1.73   BUN/Creatinine Ratio 15 9 - 20   Sodium 131 (L) 134 - 144 mmol/L   Potassium 4.7 3.5 - 5.2 mmol/L   Chloride 100 96 - 106 mmol/L   CO2 19 (L) 20 - 29 mmol/L   Calcium 8.7 8.7 - 10.2 mg/dL   Total Protein  6.7 6.0 - 8.5 g/dL   Albumin 3.1 (L) 4.0 - 5.0 g/dL   Globulin, Total 3.6 1.5 - 4.5 g/dL   Albumin/Globulin Ratio 0.9 (L) 1.2 - 2.2   Bilirubin Total 5.0 (H) 0.0 - 1.2 mg/dL   Alkaline Phosphatase 212 (H) 39 - 117 IU/L   AST 93 (H) 0 - 40 IU/L   ALT 48 (H) 0 - 44 IU/L     Assessment and Plan: 1. Suspected Covid-19 Virus Infection -Patient informed that both he and his brother likely have COVID-19 infection.  Patient advised that both he and his brother should self quarantine in different rooms of their apartment for at least 10 to 14 days and until symptoms resolve, they no longer have to use any over-the-counter medications for their symptoms and breathing difficulty improves.  I went over the CDC guidelines for self quarantine.  Advised that they should avoid having gas or friends over while there self quarantining.  If friends or family do have to come over they should wear mask. -Patient advised that if symptoms worsen like shortness of breath or high fever he should be seen in the emergency room. - MyChart COVID-19 home monitoring program; Future - Temperature monitoring; Future   Follow Up Instructions: 10 days   I discussed the assessment and treatment plan with the patient. The patient was provided an opportunity to ask questions and all were answered. The patient agreed with the plan and demonstrated an understanding of the instructions.   The patient was advised to call back or seek an in-person evaluation if the symptoms worsen or if the condition fails to improve as anticipated.  I provided 20 minutes of non-face-to-face time during this encounter.   Karle Plumber, MD

## 2018-10-20 NOTE — Patient Instructions (Signed)
Person Under Monitoring Name: Jason [name]  Location: [address] [place] [address]   Record here the list of visitors to your home since you became ill with respiratory symptoms that led you to consult a health provider:  Visitor Name Date Time In Time Out Did this person come within 6 feet of you? Indicate Y or N Relationship to Person Under Monitoring Phone number Comments   ___/____/____ __:__ AM/PM __:__ AM/PM       ___/____/____ __:__ AM/PM __:__ AM/PM       ___/____/____ __:__ AM/PM __:__ AM/PM       ___/____/____ __:__ AM/PM __:__ AM/PM       ___/____/____ __:__ AM/PM __:__ AM/PM       ___/____/____ __:__ AM/PM __:__ AM/PM       ___/____/____ __:__ AM/PM __:__ AM/PM       ___/____/____ __:__ AM/PM __:__ AM/PM       ___/____/____ __:__ AM/PM __:__ AM/PM       ___/____/____ __:__ AM/PM __:__ AM/PM       ___/____/____ __:__ AM/PM __:__ AM/PM       ___/____/____ __:__ AM/PM __:__ AM/PM       ___/____/____ __:__ AM/PM __:__ AM/PM       ___/____/____ __:__ AM/PM __:__ AM/PM       Jason [name], Division of Public Health, Communicable Disease Branch   Person Under Monitoring Name: Jason [name]  Location: [address] [place] [address]   CORONAVIRUS DISEASE 2019 (COVID-19) Guidance for Persons Under Investigation You are being tested for the virus that causes coronavirus disease 2019 (COVID-19). Public health actions are necessary to ensure protection of your health and the health of others, and to prevent further spread of infection. COVID-19 is caused by a virus that can cause symptoms, such as fever, cough, and shortness of breath. The primary transmission from person to person is by coughing or sneezing. On May 29, 2018, the [employer] announced a [employer] Emergency of International Concern and on May 30, 2018 the U.S. Department of Health and Human Services declared a public health  emergency. If the virus that causesCOVID-19 spreads in the community, it could have severe public health consequences.  As a person under investigation for COVID-19, the [facility] advises you to adhere to the following guidance until your test results are reported to you. If your test result is positive, you will receive additional information from your provider and your local health department at that time.   Remain at home until you are cleared by your health provider or public health authorities.   Keep a log of visitors to your home using the form provided. Any visitors to your home must be aware of your isolation status.  If you plan to move to a new address or leave the county, notify the local health department in your county.  Call a doctor or seek care if you have an urgent medical need. Before seeking medical care, call ahead and get instructions from the provider before arriving at the medical office, clinic or hospital. Notify them that you are being tested for the virus that causes COVID-19 so arrangements can be made, as necessary, to prevent transmission to others in the healthcare setting. Next, notify the local health department in your county.  If a medical emergency arises and you need to call 911, inform the first responders that you are being  tested for the virus that causes COVID-19. Next, notify the local health department in your county.  Adhere to all guidance set forth by the [facility] [employer] for [facility] of patients that is based on guidance from the Center for Disease Control and Prevention with suspected or confirmed COVID-19. It is provided with this guidance for Persons Under Investigation.  Your health and the health of our community are our top priorities. Public Health officials remain available to provide assistance and counseling to you about COVID-19 and compliance  with this guidance.  Provider: ____________________________________________________________ Date: ______/_____/_________  By signing below, you acknowledge that you have read and agree to comply with this Guidance for Persons Under Investigation. ______________________________________________________________ Date: ______/_____/_________  WHO DO I CALL? You can find a list of local health departments here: http://dean.org/https://www.ncdhhs.gov/divisions/public-health/county-healthdepartments Health Department: ____________________________________________________________________ Contact Name: ________________________________________________________________________ Telephone: ___________________________________________________________________________  [facility] [place] [facility], Division of Public Health, Communicable Disease Branch COVID-19 Guidance for Persons Under Investigation July 05, 2018

## 2018-10-20 NOTE — Telephone Encounter (Signed)
Contacted patient to schedule a televisit appointment in 10 days for possible COVID. Patient was scheduled but would like to know what else he can take for his headaches. Please f/u

## 2018-10-20 NOTE — Progress Notes (Signed)
Patient verified DOB Patient has taken medication today. Patient has eaten today. Patient denies pain at this time. Patient complains of intermittent headaches that began yesterday, muscle aches that have increased to fatigue today and a cough that started yesterday and increased today. Patient states muscle aches feel different than him being tired from work.  Patient complains of similar symptoms beginning 3 days ago. Patient and brother live together. Patient also still works full time as a Nature conservation officer.

## 2018-10-21 NOTE — Telephone Encounter (Signed)
Called patient and LVM to return call.   If patient calls back please inform him that he can take a low dose Advil for his headaches.

## 2018-10-21 NOTE — Telephone Encounter (Signed)
Please share with patient he may take low dose advil for HA and to keep his follow up in 10 days

## 2018-10-28 ENCOUNTER — Other Ambulatory Visit: Payer: Self-pay | Admitting: Internal Medicine

## 2018-10-28 DIAGNOSIS — K7031 Alcoholic cirrhosis of liver with ascites: Secondary | ICD-10-CM

## 2018-10-28 MED FILL — FUROSEMIDE 40 MG TAB: 40 | 30 days supply | Qty: 60 | Fill #3

## 2018-10-28 MED FILL — FOLIC ACID 1 MG TABS: 1 | 30 days supply | Qty: 30 | Fill #2

## 2018-10-28 MED FILL — POTASSIUM CL ER 20 MEQ TAB: 20 | 30 days supply | Qty: 60 | Fill #2

## 2018-10-28 MED FILL — PANTOPRAZOLE SOD DR 40 MG T: 40 | 30 days supply | Qty: 30 | Fill #4

## 2018-10-28 MED FILL — SPIRONOLACTONE 100 MG TAB: 100 | 30 days supply | Qty: 30 | Fill #5

## 2018-10-29 MED FILL — LACTULOSE 10 GM/15 ML SOLN: 10 | 8 days supply | Qty: 473 | Fill #0

## 2018-10-29 MED FILL — ONDANSETRON HCL 4 MG TABLET: 4 | 5 days supply | Qty: 20 | Fill #0

## 2018-10-30 ENCOUNTER — Encounter: Payer: Self-pay | Admitting: Internal Medicine

## 2018-10-30 ENCOUNTER — Other Ambulatory Visit: Payer: Self-pay

## 2018-10-30 ENCOUNTER — Ambulatory Visit: Payer: Self-pay | Attending: Internal Medicine | Admitting: Internal Medicine

## 2018-10-30 DIAGNOSIS — Z20822 Contact with and (suspected) exposure to covid-19: Secondary | ICD-10-CM

## 2018-10-30 DIAGNOSIS — R6889 Other general symptoms and signs: Secondary | ICD-10-CM

## 2018-10-30 NOTE — Progress Notes (Signed)
Virtual Visit via Telephone Note Due to current restrictions/limitations of in-office visits due to the COVID-19 pandemic, this scheduled clinical appointment was converted to a telehealth visit  I connected with Krupp on 10/30/18 at  2:30 PM EDT by telephone and verified that I am speaking with the correct person using two identifiers. I am in my office.  The patient is at home.  Only the patient,myself and Mickel Baas from [employer] interpreters [phone]) participated in this encounter.  I discussed the limitations, risks, security and privacy concerns of performing an evaluation and management service by telephone and the availability of in person appointments. I also discussed with the patient that there may be a patient responsible charge related to this service. The patient expressed understanding and agreed to proceed.  History of Present Illness: Patient with history of severe ETOH use disorder, alcohol induced cirrhosis, SBP.  Patient last evaluated 10/20/2018  Pt eval last wk.  Had symptoms concerning for COVID He reports he is feeling better. -pt reports cough is minimal.  No SOB, sore throat No vomiting.  No increase diarrhea above what is usual for him with use of Lactulose No fever He has not used any OTC med for his symptoms in past week He has remained out of work as instructed on last visit His brother is also doing better.  They have remained in separate room with minimal contact.    Outpatient Encounter Medications as of 10/30/2018  Medication Sig  . acidophilus (RISAQUAD) CAPS capsule Take 1 capsule by mouth daily.  . folic acid (FOLVITE) 1 MG tablet Take 1 tablet (1 mg total) by mouth daily.  . furosemide (LASIX) 40 MG tablet Take 1 tablet (40 mg total) by mouth 2 (two) times daily.  Marland Kitchen lactulose, encephalopathy, (CHRONULAC) 10 GM/15ML SOLN TAKE 30 MLS (20 G TOTAL) BY MOUTH 2 (TWO) TIMES DAILY.  . Multiple Vitamin (MULTIVITAMIN WITH MINERALS) TABS tablet  Take 1 tablet by mouth daily.  . ondansetron (ZOFRAN) 4 MG tablet TAKE 1 TABLET (4 MG TOTAL) BY MOUTH EVERY 6 (SIX) HOURS AS NEEDED FOR NAUSEA OR VOMITING.  . pantoprazole (PROTONIX) 40 MG tablet Take 1 tablet (40 mg total) by mouth daily before lunch.  . potassium chloride SA (K-DUR) 20 MEQ tablet Take 2 tablets (40 mEq total) by mouth daily.  Marland Kitchen spironolactone (ALDACTONE) 100 MG tablet Take 1 tablet (100 mg total) by mouth daily.  Marland Kitchen thiamine 100 MG tablet Take 1 tablet (100 mg total) by mouth daily.   No facility-administered encounter medications on file as of 10/30/2018.      Observations/Objective: No direct observation  Assessment and Plan: 1. Suspected Covid-19 Virus Infection -pt much improved and has been in quarantine for 12 dayI think at this times and having not used any antipyretics in last 3 days. Letter written to release him to return to work next wk.  Encourage him to continue social distancing, good handwashing and wearing a mask.   Follow Up Instructions: 3 mths   I discussed the assessment and treatment plan with the patient. The patient was provided an opportunity to ask questions and all were answered. The patient agreed with the plan and demonstrated an understanding of the instructions.   The patient was advised to call back or seek an in-person evaluation if the symptoms worsen or if the condition fails to improve as anticipated.  I provided 14 minutes of non-face-to-face time during this encounter.   Karle Plumber, MD

## 2018-10-30 NOTE — Progress Notes (Signed)
Patient verified DOB Patient has taken medication today. Patient has eaten today. Patient denies pain at this time. Patient complains of cough and runny nose and sore throat. Patient denies fevers. Patient denies any N/V/diarrhea was last noted 10 days ago.

## 2018-11-05 MED FILL — ?CIPROFLOXACIN HCL 250 MG T: 250 | 56 days supply | Qty: 24 | Fill #4

## 2018-11-05 MED FILL — LACTULOSE 10 GM/15 ML SOLN: 10 | 31 days supply | Qty: 1892 | Fill #1

## 2018-11-27 ENCOUNTER — Telehealth: Payer: Self-pay

## 2018-11-27 DIAGNOSIS — K7031 Alcoholic cirrhosis of liver with ascites: Secondary | ICD-10-CM

## 2018-11-27 NOTE — Telephone Encounter (Signed)
-----   Message from Roxine Caddy, [place] sent at 11/27/2018  5:04 PM EDT ----- Patient is due for 3 month repeat of   Labs include Zinc B12 Folate Iron Panel Thiamine  CMP INR AFP   MRI of liver  dx alcoholic cirrhosis

## 2018-11-27 NOTE — Telephone Encounter (Signed)
Please set up these orders and then call the patient once completed

## 2018-11-28 NOTE — Telephone Encounter (Signed)
The patient will need to complete blood work requested by Dr. Bryan Lemma at [facility] office prior to his MRI on 12/08/2018 at [facility] arrival at 2:45p[m for a 3:00 pm appt; patient is to be NPO for at least 4 hours prior to MRI; if the date/time for the MRI do not work for the patient he can call [phone] to change the appt-  Please call and inform the patient of these needs and let me know when this task is completed Thank you so much Bre

## 2018-11-28 NOTE — Telephone Encounter (Signed)
Patient has been notified and verbally understands

## 2018-12-01 ENCOUNTER — Other Ambulatory Visit: Payer: Self-pay | Admitting: Internal Medicine

## 2018-12-01 DIAGNOSIS — K7031 Alcoholic cirrhosis of liver with ascites: Secondary | ICD-10-CM

## 2018-12-01 MED FILL — FOLIC ACID 1 MG TABS: 1 | 30 days supply | Qty: 30 | Fill #3

## 2018-12-01 MED FILL — LACTULOSE 10 GM/15 ML SOLN: 10 | 7 days supply | Qty: 473 | Fill #2

## 2018-12-01 MED FILL — FUROSEMIDE 40 MG TAB: 40 | 30 days supply | Qty: 60 | Fill #4

## 2018-12-01 MED FILL — POTASSIUM CL ER 20 MEQ TAB: 20 | 30 days supply | Qty: 60 | Fill #3

## 2018-12-02 MED FILL — ?SPIRONOLACTONE 100MG TAB: 100 | 30 days supply | Qty: 30 | Fill #0

## 2018-12-02 MED FILL — ?PANTOPRAZOLE SO DR 40MG TA: 40 | 30 days supply | Qty: 30 | Fill #0

## 2018-12-02 MED FILL — ONDANSETRON HCL 4 MG TABLET: 4 | 5 days supply | Qty: 20 | Fill #0

## 2018-12-05 ENCOUNTER — Emergency Department (HOSPITAL_COMMUNITY)
Admission: EM | Admit: 2018-12-05 | Discharge: 2018-12-06 | Disposition: A | Discharge status: Home / Self Care | Payer: Self-pay | Attending: Emergency Medicine | Admitting: Emergency Medicine

## 2018-12-05 ENCOUNTER — Other Ambulatory Visit: Payer: Self-pay

## 2018-12-05 DIAGNOSIS — R110 Nausea: Secondary | ICD-10-CM | POA: Insufficient documentation

## 2018-12-05 DIAGNOSIS — Z87891 Personal history of nicotine dependence: Secondary | ICD-10-CM | POA: Insufficient documentation

## 2018-12-05 DIAGNOSIS — R1084 Generalized abdominal pain: Secondary | ICD-10-CM | POA: Insufficient documentation

## 2018-12-05 DIAGNOSIS — I10 Essential (primary) hypertension: Secondary | ICD-10-CM | POA: Insufficient documentation

## 2018-12-05 LAB — COMPREHENSIVE METABOLIC PANEL
ALT: 57 U/L — ABNORMAL HIGH (ref 0–44)
AST: 103 U/L — ABNORMAL HIGH (ref 15–41)
Albumin: 2.6 g/dL — ABNORMAL LOW (ref 3.5–5.0)
Alkaline Phosphatase: 266 U/L — ABNORMAL HIGH (ref 38–126)
Anion gap: 8 (ref 5–15)
BUN: 14 mg/dL (ref 6–20)
CO2: 19 mmol/L — ABNORMAL LOW (ref 22–32)
Calcium: 7 mg/dL — ABNORMAL LOW (ref 8.9–10.3)
Chloride: 108 mmol/L (ref 98–111)
Creatinine, Ser: 1.07 mg/dL (ref 0.61–1.24)
GFR calc Af Amer: >60 mL/min (ref >60)
GFR calc non Af Amer: >60 mL/min (ref >60)
Glucose, Bld: 112 mg/dL — ABNORMAL HIGH (ref 70–99)
Potassium: 4.9 mmol/L (ref 3.5–5.1)
Sodium: 135 mmol/L (ref 135–145)
Total Bilirubin: 4.6 mg/dL — ABNORMAL HIGH (ref 0.3–1.2)
Total Protein: 5.7 g/dL — ABNORMAL LOW (ref 6.5–8.1)

## 2018-12-05 LAB — CBC
HCT: 32 % — ABNORMAL LOW (ref 39.0–52.0)
Hemoglobin: 10.9 g/dL — ABNORMAL LOW (ref 13.0–17.0)
MCH: 34.3 pg — ABNORMAL HIGH (ref 26.0–34.0)
MCHC: 34.1 g/dL (ref 30.0–36.0)
MCV: 100.6 fL — ABNORMAL HIGH (ref 80.0–100.0)
Platelets: 33 10*3/uL — ABNORMAL LOW (ref 150–400)
RBC: 3.18 MIL/uL — ABNORMAL LOW (ref 4.22–5.81)
RDW: 15.6 % — ABNORMAL HIGH (ref 11.5–15.5)
WBC: 6.1 10*3/uL (ref 4.0–10.5)
nRBC: 0 % (ref 0.0–0.2)

## 2018-12-05 LAB — LIPASE, BLOOD: Lipase: 66 U/L — ABNORMAL HIGH (ref 11–51)

## 2018-12-05 MED ORDER — SODIUM CHLORIDE 0.9% FLUSH
3.0000 mL | Freq: Once | INTRAVENOUS | Status: AC
  Administered 2018-12-06: 3 mL via INTRAVENOUS

## 2018-12-05 MED ORDER — SODIUM CHLORIDE 0.9 % IV BOLUS
1000.0000 mL | Freq: Once | INTRAVENOUS | Status: AC
  Administered 2018-12-05: 1000 mL via INTRAVENOUS

## 2018-12-05 MED ORDER — HYDROMORPHONE HCL 1 MG/ML IJ SOLN
1.0000 mg | Freq: Once | INTRAMUSCULAR | Status: AC
  Administered 2018-12-05: 23:00:00 1 mg via INTRAVENOUS
  Filled 2018-12-05: qty 1

## 2018-12-05 NOTE — ED Notes (Signed)
Please contact [place], brother, at [phone]

## 2018-12-05 NOTE — ED Triage Notes (Signed)
Pt presents with generalized abdominal pain that started 20 mins prior to EMS arrival with associated nausea denies emesis or diarrhea. Pt reports that he has not drank  ethol in at was 3 months, pt presents shaking, with chills, nausea, and abdominal distention, abdomen is soft to touch  pt has tenderness with palpation x 4 quads more apparent to left lower quad.    HX of HTN
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
## 2018-12-05 NOTE — ED Triage Notes (Addendum)
Arrives via EMS from [place]. Generalized abd pain, N/V, hx of cirrhosis. Spanish speaking. CBG 146.

## 2018-12-06 LAB — URINALYSIS, ROUTINE W REFLEX MICROSCOPIC
Bilirubin Urine: NEGATIVE
Glucose, UA: NEGATIVE mg/dL
Ketones, ur: NEGATIVE mg/dL
Leukocytes,Ua: NEGATIVE
Nitrite: NEGATIVE
Protein, ur: NEGATIVE mg/dL
RBC / HPF: >50 RBC/hpf — ABNORMAL HIGH (ref 0–5)
Specific Gravity, Urine: 1.016 (ref 1.005–1.030)
pH: 6 (ref 5.0–8.0)

## 2018-12-06 LAB — LACTIC ACID, PLASMA: Lactic Acid, Venous: 1 mmol/L (ref 0.5–1.9)

## 2018-12-06 NOTE — Discharge Instructions (Addendum)
Keep your scheduled appointment this coming Monday with your doctor. Return to the emergency department with any severe pain, high fever, uncontrolled vomiting or new concern.

## 2018-12-06 NOTE — ED Notes (Signed)
[id number] Jason [name], spanish interpretor used to explain DC instructions, blood cultures and follow up care.

## 2018-12-06 NOTE — ED Notes (Signed)
Unable to capture signature, pt verbalized understanding of discharge instructions

## 2018-12-06 NOTE — ED Notes (Signed)
Two sets of blood cultures obtained.

## 2018-12-08 ENCOUNTER — Other Ambulatory Visit (INDEPENDENT_AMBULATORY_CARE_PROVIDER_SITE_OTHER): Payer: Self-pay

## 2018-12-08 ENCOUNTER — Ambulatory Visit (HOSPITAL_COMMUNITY)
Admission: RE | Admit: 2018-12-08 | Discharge: 2018-12-08 | Disposition: A | Discharge status: Home / Self Care | Payer: Self-pay | Source: Ambulatory Visit | Attending: Gastroenterology | Admitting: Gastroenterology

## 2018-12-08 ENCOUNTER — Other Ambulatory Visit: Payer: Self-pay

## 2018-12-08 ENCOUNTER — Telehealth: Payer: Self-pay | Admitting: Gastroenterology

## 2018-12-08 DIAGNOSIS — K7031 Alcoholic cirrhosis of liver with ascites: Secondary | ICD-10-CM | POA: Insufficient documentation

## 2018-12-08 LAB — IBC PANEL
Iron: 45 ug/dL (ref 42–165)
Saturation Ratios: 24.4 % (ref 20.0–50.0)
Transferrin: 132 mg/dL — ABNORMAL LOW (ref 212.0–360.0)

## 2018-12-08 LAB — COMPREHENSIVE METABOLIC PANEL
ALT: 44 U/L (ref 0–53)
AST: 75 U/L — ABNORMAL HIGH (ref 0–37)
Albumin: 2.5 g/dL — ABNORMAL LOW (ref 3.5–5.2)
Alkaline Phosphatase: 175 U/L — ABNORMAL HIGH (ref 39–117)
BUN: 11 mg/dL (ref 6–23)
CO2: 21 mEq/L (ref 19–32)
Calcium: 8.3 mg/dL — ABNORMAL LOW (ref 8.4–10.5)
Chloride: 104 mEq/L (ref 96–112)
Creatinine, Ser: 0.77 mg/dL (ref 0.40–1.50)
GFR: 114.5 mL/min (ref >60.00)
Glucose, Bld: 120 mg/dL — ABNORMAL HIGH (ref 70–99)
Potassium: 4.2 mEq/L (ref 3.5–5.1)
Sodium: 131 mEq/L — ABNORMAL LOW (ref 135–145)
Total Bilirubin: 4.9 mg/dL — ABNORMAL HIGH (ref 0.2–1.2)
Total Protein: 5.6 g/dL — ABNORMAL LOW (ref 6.0–8.3)

## 2018-12-08 LAB — PROTIME-INR
INR: 2.2 ratio — ABNORMAL HIGH (ref 0.8–1.0)
Prothrombin Time: 25.7 s — ABNORMAL HIGH (ref 9.6–13.1)

## 2018-12-08 LAB — FOLATE: Folate: 20.4 ng/mL (ref >5.9)

## 2018-12-08 LAB — VITAMIN B12: Vitamin B-12: >1500 pg/mL — ABNORMAL HIGH (ref 211–911)

## 2018-12-08 MED ORDER — GADOBUTROL 1 MMOL/ML IV SOLN
10.0000 mL | Freq: Once | INTRAVENOUS | Status: AC | PRN
  Administered 2018-12-08: 10 mL via INTRAVENOUS

## 2018-12-08 NOTE — Telephone Encounter (Signed)
Yes, the office is currently trying to schedule this patient for an EGD with possible banding at [facility]; availability is limited at this time;

## 2018-12-08 NOTE — Telephone Encounter (Signed)
Is there anything I need to do for this patient (after you reveiew the previous message)? Please advise

## 2018-12-08 NOTE — Telephone Encounter (Signed)
Pt called to inform that he was at the [facility] ED this past weekend.

## 2018-12-08 NOTE — Telephone Encounter (Signed)
Looks like he was being seen by his PCM today per ER discharge notes.  I reviewed the ER notes and labs from the ER.  Okay to schedule follow-up appointment next available.  I believe we are also trying to get him in at some point for EGD for variceal screening at [facility].  Thanks.

## 2018-12-09 NOTE — Telephone Encounter (Signed)
Please see first few messages for clarification on patient needs

## 2018-12-09 NOTE — Telephone Encounter (Signed)
Will you please call this patient and get him scheduled for the next available appt and also let him know that the office is trying to get him scheduled for an EGD for variceal screening at [facility] as soon as possible;  Thank you

## 2018-12-09 NOTE — Telephone Encounter (Signed)
Spoke to Mr Jason [name] and scheduled the next available appointment on Tue 12-23-2018 @ 10:40am. I tried to explain about the hospital procedure but I do not think he understood very well even after three attempts.

## 2018-12-11 LAB — ZINC: Zinc: 41 ug/dL — ABNORMAL LOW (ref 60–130)

## 2018-12-11 LAB — AFP TUMOR MARKER: AFP-Tumor Marker: 3.1 ng/mL (ref <6.1)

## 2018-12-11 LAB — VITAMIN B1: Vitamin B1 (Thiamine): 32 nmol/L — ABNORMAL HIGH (ref 8–30)

## 2018-12-17 ENCOUNTER — Other Ambulatory Visit: Payer: Self-pay | Admitting: Internal Medicine

## 2018-12-17 DIAGNOSIS — K7031 Alcoholic cirrhosis of liver with ascites: Secondary | ICD-10-CM

## 2018-12-18 MED FILL — LACTULOSE 10 GM/15 ML SOLN: 10 | 7 days supply | Qty: 473 | Fill #0

## 2018-12-19 MED FILL — ?CIPROFLOXACIN HCL 250 MG T: 250 | 56 days supply | Qty: 24 | Fill #5

## 2018-12-23 ENCOUNTER — Ambulatory Visit (INDEPENDENT_AMBULATORY_CARE_PROVIDER_SITE_OTHER): Payer: Self-pay | Admitting: Gastroenterology

## 2018-12-23 ENCOUNTER — Telehealth: Payer: Self-pay

## 2018-12-23 ENCOUNTER — Encounter: Payer: Self-pay | Admitting: Gastroenterology

## 2018-12-23 ENCOUNTER — Other Ambulatory Visit: Payer: Self-pay

## 2018-12-23 VITALS — BP 134/70 | HR 94 | Temp 98.6°F | Ht 65.0 in | Wt 222.5 lb

## 2018-12-23 DIAGNOSIS — F1021 Alcohol dependence, in remission: Secondary | ICD-10-CM

## 2018-12-23 DIAGNOSIS — K7682 Hepatic encephalopathy: Secondary | ICD-10-CM

## 2018-12-23 DIAGNOSIS — K766 Portal hypertension: Secondary | ICD-10-CM

## 2018-12-23 DIAGNOSIS — K7031 Alcoholic cirrhosis of liver with ascites: Secondary | ICD-10-CM

## 2018-12-23 DIAGNOSIS — K7290 Hepatic failure, unspecified without coma: Secondary | ICD-10-CM

## 2018-12-23 DIAGNOSIS — R600 Localized edema: Secondary | ICD-10-CM

## 2018-12-23 NOTE — Telephone Encounter (Signed)
Covid-19 screening questions   Do you now or have you had a fever in the last 14 days? No  Do you have any respiratory symptoms of shortness of breath or cough now or in the last 14 days? No  Do you have any family members or close contacts with diagnosed or suspected Covid-19 in the past 14 days? No  Have you been tested for Covid-19 and found to be positive? No        

## 2018-12-23 NOTE — Progress Notes (Signed)
P  Chief Complaint:    Cirrhosis  GI History: 38 year old male with decompensated alcoholic cirrhosis complicated by ascites, SBP, hepatic encephalopathy, coagulopathy, alcoholic hepatitis, varices noted on cross-sectional imaging (no EGD today).  Was admitted in 04/2018 with sepsis/bacteremia (Rothia mucilaginosa), superimposed alcoholic hepatitis, e.g., SBP.  Completed 14-day course of IV antibiotics and transition to ciprofloxacin for SBP prophylaxis (paracentesis negative for SBP, but prophylaxis recommended by ID given bacteremia with enteric organism).  Cirrhosis Evaluation: - Etiology: Alcohol - Complications: Hepatic encephalopathy, ascites, SBP, coagulopathy, varices (on CT and MRI, but no EGD done yet) - HCC screening:           -11/2018: AFP normal (3.1)         -04/30/2018, CT: cirrhotic appearing liver, 2 scattered subcentimeter hypodensities in the right liver lobe which were too small to characterize, mild splenomegaly, normal pancreas, no duct dilation, large paraumbilical varices, large proximal gastric varices. Recommended MRI abdomen in 3 to 6 months.         -08/04/2018, MRI three-phase: Several small hepatic cysts, 1.1 x 0.9 cm indeterminate focus of arterial enhancement in segment 2 without capsule or washout appearance (LI-RADS LR 3).  Recommended alternate diagnostic imaging in 3 to 6 months.         -12/08/2018, MRI three-phase: LR 3 lesion no longer seen, mild arterial phase enhancement in the dome of the right hepatic lobe favoring transient hepatic attenuation without underlying lesion.  Prominent GB wall thickening (likely 2/2 hypoalbuminemia).  Trace ascites, cirrhosis with portal hypertension, right gastric varices, small uphill paraesophageal varices, splenomegaly - Variceal screening: To schedule EGD today - Serologic evaluation: Completed 04/2018.  No concomitant injury/disease - Viral hepatitis vaccination: Started hepatitis B series.  Immunity to HAV - Flu  vaccine: 2020 - Pneumonia vaccine: 2020 - Liver biopsy: None - Cirrhosis medications: Lasix 40 mg, Aldactone 100 mg, ciprofloxacin 250 mg weekly, lactulose, potassium -MELD: 25 - Child Pugh score: C (12 pts)  HPI:    Patient is a 38 y.o. male presenting to the [facility]ology [facility] for follow-up.  He was last seen by me in 07/2018.  At that time was doing well, tolerating all medications as prescribed, still with some DOE and some abdominal distention, but otherwise much improved.  Had quit all alcohol at that time (last drink was prior to hospitalization 04/2018).  Still with good social support network.  No relapse.  Was seen via telehealth by his PCM in 10/2018 with concern for COVID of symptoms.  Treated supportively and with quarantine at home.  Seen in the ER on 12/05/2018 with acute onset abdominal pain.  Normal WBC.  Lipase 66.  Treated with pain medications with significant improvement and discharged home. No recurrence of pain since then.  Today, he states he otherwise feels well.  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  No EtOH cravings.  No relapse.  Taking all medications as prescribed.  Good p.o. intake.  Offers no complaints.  Recent labs notable for INR 2.2, Na 131, creatinine 0.77, albumin 2.5, T bili 4.9, AST/ALT 75/44, PLT 33, H/H 11/32 (stable). Mildly reduced zinc (41; start supplement today).  Normal vitamin B1, iron panel, folate, B12, AFP (3.1)  Review of systems:     No chest pain, no SOB, no fevers, no urinary sx   Past Medical History:  Diagnosis Date  . Alcoholic cirrhosis of liver (HCC)   . Hypertension     Patient's surgical history, family medical history, social history, medications and allergies were  all reviewed in Epic    Current Outpatient Medications  Medication Sig Dispense Refill  . acidophilus (RISAQUAD) CAPS capsule Take 1 capsule by mouth daily. 30 capsule 3  . folic acid (FOLVITE) 1 MG tablet Take 1 tablet (1 mg total) by mouth daily. 100 tablet 1   . furosemide (LASIX) 40 MG tablet Take 1 tablet (40 mg total) by mouth 2 (two) times daily. 60 tablet 5  . lactulose, encephalopathy, (CHRONULAC) 10 GM/15ML SOLN TAKE 30 MLS (20 G TOTAL) BY MOUTH 2 (TWO) TIMES DAILY. 473 mL 1  . Multiple Vitamin (MULTIVITAMIN WITH MINERALS) TABS tablet Take 1 tablet by mouth daily. 30 tablet 2  . ondansetron (ZOFRAN) 4 MG tablet TAKE 1 TABLET (4 MG TOTAL) BY MOUTH EVERY 6 (SIX) HOURS AS NEEDED FOR NAUSEA OR VOMITING. 20 tablet 0  . pantoprazole (PROTONIX) 40 MG tablet TAKE 1 TABLET (40 MG TOTAL) BY MOUTH DAILY BEFORE LUNCH. 30 tablet 0  . potassium chloride SA (K-DUR) 20 MEQ tablet Take 2 tablets (40 mEq total) by mouth daily. (Patient taking differently: Take 20 mEq by mouth 2 (two) times daily. ) 60 tablet 11  . spironolactone (ALDACTONE) 100 MG tablet TAKE 1 TABLET (100 MG TOTAL) BY MOUTH DAILY. 30 tablet 0  . thiamine 100 MG tablet Take 1 tablet (100 mg total) by mouth daily. 30 tablet 5   No current facility-administered medications for this visit.     Physical Exam:     Temp 98.6 F (37 C)   Ht 5\' 5"  (1.651 m)   Wt 222 lb 8 oz (100.9 kg)   BMI 37.03 kg/m   GENERAL:  Pleasant male in NAD PSYCH: : Cooperative, normal affect EENT:  conjunctiva pink, mucous membranes moist, neck supple without masses CARDIAC:  RRR, no murmur heard, no peripheral edema PULM: Normal respiratory effort, lungs CTA bilaterally, no wheezing ABDOMEN:  Nondistended, soft, nontender. No obvious masses, no hepatomegaly,  normal bowel sounds SKIN:  turgor, no lesions seen Musculoskeletal:  Normal muscle tone, normal strength NEURO: Alert and oriented x 3, no focal neurologic deficits   IMPRESSION and PLAN:     1) Alcoholic Cirrhosis: Decompensated alcoholic cirrhosis with history of superimposed Alcoholic Hepatitis in 04/2018.  Has since quit alcohol in 04/2018 without any recurrence.  - [facility] radiographic  evidence of varices and elevated MELD  (25) and Child Pugh (C) scores - Repeat labs in 6 months.   - MELD still 25 despite complete abstinence of EtOH for 6+ months. Will send referral to Annam[facility] Major[facility] Drazek at [facility] -Resume Lasix and Aldactone at current dose -Resume lactulose -Resume ciprofloxacin for SBP prophylaxis per prior ID recommendations -Completed hepatitis B vaccine series as scheduled -Repeat RUQ US in 6 months for ongoing HCC screening -Resume low-sodium diet -Resume abstinence of all alcohol -Resume vitamin supplements  2) LI-RADS 3 hepatic lesion/HCC screening: 1.1 x 0.9 cm arterial enhancing lesion in segment 2 of the liver without capsule or typical washout.  This was not seen on repeat MRI earlier this month.   AFP normal at 3.1.  -Ultrasound in 6 months for ongoing HCC screening  3) Ascites: Much improved since starting diuretic therapy.    4) LE Edema: Improved with diuretics and low-sodium diet.  Resume current therapy.  5) Alcohol abuse disorder: Has been alcohol free since hospital discharge in 04/2018.    Again applauded his efforts and continued desire to  maintain complete abstinence of alcohol and encouraged him that this is undoubtedly the most important thing he can do for his overall health and improved morbidity/mortality.  Given absence, is now appropriate to refer to [facility] as above.  6) Portal hypertension: Treatment as above  RTC in 3 to 6 months or sooner as needed  The indications, risks, and benefits of EGD with possible variceal banding were explained to the patient in detail. Risks include but are not limited to bleeding, perforation, adverse reaction to medications, and cardiopulmonary compromise. Sequelae include but are not limited to the possibility of surgery, hositalization, and mortality. The patient verbalized understanding and wished to proceed. All questions answered, referred to scheduler.  Further recommendations pending results of the exam.   I spent a total of 25 minutes of face-to-face time with the patient. Greater than 50% of the time was spent counseling and coordinating care.          Lavena Bullion ,DO, FACG 12/23/2018, 10:57 AM

## 2018-12-23 NOTE — Patient Instructions (Signed)
If you are age 36 or older, your body mass index should be between 23-30. Your Body mass index is 37.03 kg/m. If this is out of the aforementioned range listed, please consider follow up with your Primary Care Provider.  If you are age 73 or younger, your body mass index should be between 19-25. Your Body mass index is 37.03 kg/m. If this is out of the aformentioned range listed, please consider follow up with your Primary Care Provider.   It has been recommended to you by your physician that you have a(n) EGD completed. We did not schedule the procedure(s) today, you will be contacted by our office when this haas been scheduled.   You have been referred to [facility], you will be contacted with this appointment.  Follow up in 3-6 months,   It was a pleasure to see you today!  Vito Cirigliano, D.O.

## 2018-12-30 MED FILL — LACTULOSE 10 GM/15 ML SOLN: 10 | 7 days supply | Qty: 473 | Fill #1

## 2019-01-01 ENCOUNTER — Ambulatory Visit: Payer: Self-pay | Attending: Internal Medicine | Admitting: Internal Medicine

## 2019-01-01 ENCOUNTER — Encounter: Payer: Self-pay | Admitting: Internal Medicine

## 2019-01-01 DIAGNOSIS — R040 Epistaxis: Secondary | ICD-10-CM

## 2019-01-01 DIAGNOSIS — K7460 Unspecified cirrhosis of liver: Secondary | ICD-10-CM

## 2019-01-01 DIAGNOSIS — R21 Rash and other nonspecific skin eruption: Secondary | ICD-10-CM

## 2019-01-01 DIAGNOSIS — K7290 Hepatic failure, unspecified without coma: Secondary | ICD-10-CM

## 2019-01-01 NOTE — Progress Notes (Signed)
Virtual Visit via Telephone Note Due to current restrictions/limitations of in-office visits due to the COVID-19 pandemic, this scheduled clinical appointment was converted to a telehealth visit  I connected with Jason [name] on 01/01/19 at 4:23 p.m by telephone and verified that I am speaking with the correct person using two identifiers. I am in my office.  The patient is at home.  Only the patient, myself and Clifton James from [employer] [phone]) participated in this encounter.  I discussed the limitations, risks, security and privacy concerns of performing an evaluation and management service by telephone and the availability of in person appointments. I also discussed with the patient that there may be a patient responsible charge related to this service. The patient expressed understanding and agreed to proceed.   History of Present Illness: Patient with history of severe ETOH use disorder, alcohol induced cirrhosis, SBP  EtOH cirrhosis: He remains abstinent from all alcoholic beverages.   Saw Dr. Bryan Lemma 12/23/2018.  He noted that MELD score remains at 25.  He plans to do EDG for EV screening Patient continues on furosemide and Spironolactone.  Complains of intermittent nosebleeds from both sides of the nostrils over the past 3 weeks.  He states that it occurs as several small drops of blood.  He denies picking his nose.  He states that the gastroenterologist told him that his nose is dry and that he can use some nasal spray.  Of note his platelet count has remained in the 30s on his last several CBCs.  He also noticed a little redness and itching below his umbilicus over the past few weeks.  No drainage.   Observations/Objective: Results for orders placed or performed in visit on 12/08/18  Vitamin B1  Result Value Ref Range   Vitamin B1 (Thiamine) 32 (H) 8 - 30 nmol/L  IBC panel  Result Value Ref Range   Iron 45 42 - 165 ug/dL   Transferrin 132.0 (L) 212.0  - 360.0 mg/dL   Saturation Ratios 24.4 20.0 - 50.0 %  INR/PT  Result Value Ref Range   INR 2.2 (H) 0.8 - 1.0 ratio   Prothrombin Time 25.7 (H) 9.6 - 13.1 sec  Folate  Result Value Ref Range   Folate 20.4 >5.9 ng/mL  Comp Met (CMET)  Result Value Ref Range   Sodium 131 (L) 135 - 145 mEq/L   Potassium 4.2 3.5 - 5.1 mEq/L   Chloride 104 96 - 112 mEq/L   CO2 21 19 - 32 mEq/L   Glucose, Bld 120 (H) 70 - 99 mg/dL   BUN 11 6 - 23 mg/dL   Creatinine, Ser 0.77 0.40 - 1.50 mg/dL   Total Bilirubin 4.9 (H) 0.2 - 1.2 mg/dL   Alkaline Phosphatase 175 (H) 39 - 117 U/L   AST 75 (H) 0 - 37 U/L   ALT 44 0 - 53 U/L   Total Protein 5.6 (L) 6.0 - 8.3 g/dL   Albumin 2.5 (L) 3.5 - 5.2 g/dL   Calcium 8.3 (L) 8.4 - 10.5 mg/dL   GFR 114.50 >60.00 mL/min  B12  Result Value Ref Range   Vitamin B-12 >1500 (H) 211 - 911 pg/mL  AFP tumor marker  Result Value Ref Range   AFP-Tumor Marker 3.1 <6.1 ng/mL  Zinc  Result Value Ref Range   Zinc 41 (L) 60 - 130 mcg/dL     Assessment and Plan: 1. Epistaxis Caused by hypercoagulability associated with his liver disease and the low platelet count. I  recommend purchasing Afrin nasal spray over-the-counter and using 1 spray in each nostril as needed but no more than twice a week.  If he continues to have bleeding, we can refer him to ENT.  2. Decompensated hepatic cirrhosis ([place]) Followed by GI.  3. Rash Recommend keeping the area dry and clean and using some hydrocortisone cream over-the-counter.  If no improvement he will follow-up   Follow Up Instructions: 3 months.   I discussed the assessment and treatment plan with the patient. The patient was provided an opportunity to ask questions and all were answered. The patient agreed with the plan and demonstrated an understanding of the instructions.   The patient was advised to call back or seek an in-person evaluation if the symptoms worsen or if the condition fails to improve as anticipated.  I  provided 13 minutes of non-face-to-face time during this encounter.   Karle Plumber, MD

## 2019-01-01 NOTE — Progress Notes (Signed)
Pt states he has been having nosebleeds and the GI doctor doctor told him he can use some type of spray

## 2019-01-09 ENCOUNTER — Other Ambulatory Visit: Payer: Self-pay | Admitting: Internal Medicine

## 2019-01-09 DIAGNOSIS — K7031 Alcoholic cirrhosis of liver with ascites: Secondary | ICD-10-CM

## 2019-01-09 MED FILL — FUROSEMIDE 40 MG TAB: 40 | 30 days supply | Qty: 60 | Fill #5

## 2019-01-09 MED FILL — POTASSIUM CL ER 20 MEQ TAB: 20 | 30 days supply | Qty: 60 | Fill #4

## 2019-01-09 MED FILL — ?PANTOPRAZOLE SODI DR 40MGT: 40 | 30 days supply | Qty: 30 | Fill #0

## 2019-01-09 MED FILL — LACTULOSE 10 GM/15 ML SOLN: 10 | 7 days supply | Qty: 473 | Fill #0

## 2019-01-09 MED FILL — ONDANSETRON HCL 4 MG TABLET: 4 | 5 days supply | Qty: 20 | Fill #0

## 2019-01-09 MED FILL — ?FOLIC ACID 1MG TAB: 1 | 30 days supply | Qty: 30 | Fill #4

## 2019-01-22 ENCOUNTER — Other Ambulatory Visit: Payer: Self-pay | Admitting: Internal Medicine

## 2019-01-22 DIAGNOSIS — K7031 Alcoholic cirrhosis of liver with ascites: Secondary | ICD-10-CM

## 2019-01-22 MED FILL — LACTULOSE 10 GM/15 ML SOLN: 10 | 7 days supply | Qty: 473 | Fill #1

## 2019-01-23 MED FILL — ONDANSETRON HCL 4 MG TABLET: 4 | 5 days supply | Qty: 20 | Fill #0

## 2019-02-02 ENCOUNTER — Telehealth: Payer: Self-pay | Admitting: Internal Medicine

## 2019-02-02 DIAGNOSIS — K7031 Alcoholic cirrhosis of liver with ascites: Secondary | ICD-10-CM

## 2019-02-02 MED ORDER — SPIRONOLACTONE 100 MG PO TABS: 100.0000 mg | ORAL_TABLET | Freq: Every day | ORAL | 2 refills | Status: DC

## 2019-02-02 MED ORDER — LACTULOSE ENCEPHALOPATHY 10 GM/15ML PO SOLN: ORAL | 1 refills | Status: DC

## 2019-02-02 MED FILL — ?SPIRONOLACTONE 100MG TAB: 100 | 30 days supply | Qty: 30 | Fill #0

## 2019-02-02 MED FILL — LACTULOSE 10 GM/15 ML SOLN: 10 | 7 days supply | Qty: 473 | Fill #0

## 2019-02-02 NOTE — Telephone Encounter (Signed)
1) Medication(s) Requested (by name): Spironolactone lactulose 2) Pharmacy of Choice:  [facility]

## 2019-02-10 ENCOUNTER — Other Ambulatory Visit: Payer: Self-pay | Admitting: Internal Medicine

## 2019-02-10 DIAGNOSIS — K7031 Alcoholic cirrhosis of liver with ascites: Secondary | ICD-10-CM

## 2019-02-10 MED FILL — ?FUROSEMIDE 40 MG TABLET: 40 | 30 days supply | Qty: 30 | Fill #2

## 2019-02-10 MED FILL — POTASSIUM CL ER 20 MEQ TAB: 20 | 30 days supply | Qty: 60 | Fill #5

## 2019-02-10 MED FILL — ?FOLIC ACID 1MG TAB: 1 | 30 days supply | Qty: 30 | Fill #5

## 2019-02-11 ENCOUNTER — Telehealth: Payer: Self-pay

## 2019-02-11 MED FILL — PANTOPRAZOLE SOD DR 40 MG T: 40 | 30 days supply | Qty: 30 | Fill #0

## 2019-02-11 NOTE — Telephone Encounter (Signed)
I called Dr. Cherlynn June office to inquire about the referral placed for Alcoholic Liver Cirrhosis. [facility] has been trying to get in touch with the patient without success. Last attempt was made on 01/12/2019 before sending a final letter. The patients referral is still on file

## 2019-02-12 NOTE — Telephone Encounter (Signed)
Patient was notified of [facility] failed attempts to reach him and schedule his consult. He states that he is going to call and make an appointment. Patient was given the contact information for Dr. Roosevelt Locks [phone].

## 2019-02-20 MED FILL — LACTULOSE 10 GM/15 ML SOLN: 10 | 7 days supply | Qty: 473 | Fill #1

## 2019-02-20 MED FILL — ?CIPROFLOXACIN HCL 250 MG T: 250 | 56 days supply | Qty: 24 | Fill #6

## 2019-03-06 ENCOUNTER — Encounter: Payer: Self-pay | Admitting: Family Medicine

## 2019-03-06 ENCOUNTER — Ambulatory Visit: Payer: Self-pay

## 2019-03-06 ENCOUNTER — Other Ambulatory Visit: Payer: Self-pay | Admitting: Internal Medicine

## 2019-03-06 ENCOUNTER — Ambulatory Visit: Payer: Self-pay | Attending: Family Medicine | Admitting: Family Medicine

## 2019-03-06 ENCOUNTER — Other Ambulatory Visit: Payer: Self-pay

## 2019-03-06 DIAGNOSIS — R350 Frequency of micturition: Secondary | ICD-10-CM

## 2019-03-06 DIAGNOSIS — R1030 Lower abdominal pain, unspecified: Secondary | ICD-10-CM

## 2019-03-06 DIAGNOSIS — R8290 Unspecified abnormal findings in urine: Secondary | ICD-10-CM

## 2019-03-06 DIAGNOSIS — K7031 Alcoholic cirrhosis of liver with ascites: Secondary | ICD-10-CM

## 2019-03-06 LAB — POCT URINALYSIS DIP (CLINITEK)
Bilirubin, UA: NEGATIVE
Glucose, UA: NEGATIVE mg/dL
Ketones, POC UA: NEGATIVE mg/dL
Leukocytes, UA: NEGATIVE
Nitrite, UA: NEGATIVE
POC PROTEIN,UA: NEGATIVE
Spec Grav, UA: 1.025
Urobilinogen, UA: 1 U/dL
pH, UA: 7

## 2019-03-06 LAB — POCT GLYCOSYLATED HEMOGLOBIN (HGB A1C): Hemoglobin A1C: 4.7 % (ref 4.0–5.6)

## 2019-03-06 MED ORDER — SPIRONOLACTONE 100 MG PO TABS: 100.0000 mg | ORAL_TABLET | Freq: Every day | ORAL | 5 refills | Status: DC

## 2019-03-06 MED ORDER — POTASSIUM CHLORIDE CRYS ER 20 MEQ PO TBCR: 20.0000 meq | EXTENDED_RELEASE_TABLET | Freq: Two times a day (BID) | ORAL | 5 refills | Status: DC

## 2019-03-06 MED ORDER — CIPROFLOXACIN HCL 500 MG PO TABS: 500.0000 mg | ORAL_TABLET | Freq: Two times a day (BID) | ORAL | 0 refills | Status: AC

## 2019-03-06 MED ORDER — FUROSEMIDE 40 MG PO TABS: 40.0000 mg | ORAL_TABLET | Freq: Two times a day (BID) | ORAL | 5 refills | Status: DC

## 2019-03-06 MED ORDER — PANTOPRAZOLE SODIUM 40 MG PO TBEC: 40.0000 mg | DELAYED_RELEASE_TABLET | Freq: Every day | ORAL | 5 refills | Status: DC

## 2019-03-06 MED FILL — FUROSEMIDE 40 MG TAB: 40 | 30 days supply | Qty: 60 | Fill #0

## 2019-03-06 MED FILL — PANTOPRAZOLE SOD DR 40 MG T: 40 | 30 days supply | Qty: 30 | Fill #0

## 2019-03-06 MED FILL — POTASSIUM CL ER 20 MEQ TAB: 20 | 30 days supply | Qty: 60 | Fill #0

## 2019-03-06 MED FILL — ?SPIRONOLACTONE 100MG TAB: 100 | 30 days supply | Qty: 30 | Fill #0

## 2019-03-06 MED FILL — ONDANSETRON HCL 4 MG TABLET: 4 | 5 days supply | Qty: 20 | Fill #0

## 2019-03-06 MED FILL — CIPROFLOXACIN HCL 500 MG TA: 500 | 7 days supply | Qty: 14 | Fill #0

## 2019-03-06 MED FILL — LACTULOSE 10 GM/15 ML SOLN: 10 | 7 days supply | Qty: 473 | Fill #0

## 2019-03-06 MED FILL — ?FOLIC ACID 1MG TAB: 1 | 20 days supply | Qty: 20 | Fill #6

## 2019-03-06 NOTE — Progress Notes (Signed)
  Stomach pain left side a little bit below the chest for about 2 days last week but not having those pains right now  All med refills
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
## 2019-03-07 LAB — COMPREHENSIVE METABOLIC PANEL WITH GFR
ALT: 55 IU/L — ABNORMAL HIGH (ref 0–44)
AST: 98 IU/L — ABNORMAL HIGH (ref 0–40)
Albumin/Globulin Ratio: 1 — ABNORMAL LOW (ref 1.2–2.2)
Albumin: 3.1 g/dL — ABNORMAL LOW (ref 4.0–5.0)
Alkaline Phosphatase: 278 IU/L — ABNORMAL HIGH (ref 39–117)
BUN/Creatinine Ratio: 18 (ref 9–20)
BUN: 11 mg/dL (ref 6–20)
Bilirubin Total: 3.8 mg/dL — ABNORMAL HIGH (ref 0.0–1.2)
CO2: 19 mmol/L — ABNORMAL LOW (ref 20–29)
Calcium: 8.6 mg/dL — ABNORMAL LOW (ref 8.7–10.2)
Chloride: 104 mmol/L (ref 96–106)
Creatinine, Ser: 0.62 mg/dL — ABNORMAL LOW (ref 0.76–1.27)
GFR calc Af Amer: 149 mL/min/1.73
GFR calc non Af Amer: 129 mL/min/1.73
Globulin, Total: 3 g/dL (ref 1.5–4.5)
Glucose: 112 mg/dL — ABNORMAL HIGH (ref 65–99)
Potassium: 4.1 mmol/L (ref 3.5–5.2)
Sodium: 136 mmol/L (ref 134–144)
Total Protein: 6.1 g/dL (ref 6.0–8.5)

## 2019-03-07 LAB — CBC
Hematocrit: 31.6 % — ABNORMAL LOW (ref 37.5–51.0)
Hemoglobin: 11.9 g/dL — ABNORMAL LOW (ref 13.0–17.7)
MCH: 33.7 pg — ABNORMAL HIGH (ref 26.6–33.0)
MCHC: 37.7 g/dL — ABNORMAL HIGH (ref 31.5–35.7)
MCV: 90 fL (ref 79–97)
Platelets: 50 x10E3/uL — CL (ref 150–450)
RBC: 3.53 x10E6/uL — ABNORMAL LOW (ref 4.14–5.80)
RDW: 15.5 % — ABNORMAL HIGH (ref 11.6–15.4)
WBC: 6.3 x10E3/uL (ref 3.4–10.8)

## 2019-03-10 NOTE — Telephone Encounter (Signed)
Hi Kelly, we received a referral from pt's PCP indicating that pt needs to see a liver specialist asap. I called pt who told me that he called [facility] after speaking with Heather on 10/14. He was told that someone who speaks Spanish was going to contact him to make an appt but nobody has called him yet. Could you pls help pt getting an appt? Thank you.

## 2019-03-11 NOTE — Telephone Encounter (Signed)
I spoke with [facility] form Dr Cherlynn June office. She states that there office has tried to contact this patient several times, and mailed him a letter to contact the office to schedule an appointment. Chelsea said she was working from home today and would not be able to have a 3 way conversation with the [place] interpreter. She will be back in the office tomorrow and will have a interpreter available to speak with patient and schedule his appointment if he is available to contact.

## 2019-04-03 ENCOUNTER — Other Ambulatory Visit: Payer: Self-pay | Admitting: Family Medicine

## 2019-04-03 ENCOUNTER — Other Ambulatory Visit: Payer: Self-pay | Admitting: Internal Medicine

## 2019-04-03 DIAGNOSIS — K7031 Alcoholic cirrhosis of liver with ascites: Secondary | ICD-10-CM

## 2019-04-03 DIAGNOSIS — R350 Frequency of micturition: Secondary | ICD-10-CM

## 2019-04-03 DIAGNOSIS — R1030 Lower abdominal pain, unspecified: Secondary | ICD-10-CM

## 2019-04-03 MED FILL — LACTULOSE 10 GM/15 ML SOLN: 10 | 7 days supply | Qty: 473 | Fill #1

## 2019-04-03 MED FILL — POTASSIUM CL ER 20 MEQ TAB: 20 | 30 days supply | Qty: 60 | Fill #1

## 2019-04-03 MED FILL — FUROSEMIDE 40 MG TAB: 40 | 30 days supply | Qty: 60 | Fill #1

## 2019-04-03 MED FILL — PANTOPRAZOLE SOD DR 40 MG T: 40 | 30 days supply | Qty: 30 | Fill #1

## 2019-04-03 MED FILL — SPIRONOLACTONE 100 MG TAB: 100 | 30 days supply | Qty: 30 | Fill #1

## 2019-04-07 MED FILL — ONDANSETRON HCL 4 MG TABLET: 4 | 5 days supply | Qty: 20 | Fill #0

## 2019-04-07 MED FILL — ?FOLIC ACID 1MG TAB: 1 | 30 days supply | Qty: 30 | Fill #0

## 2019-05-05 ENCOUNTER — Other Ambulatory Visit: Payer: Self-pay | Admitting: Family Medicine

## 2019-05-05 ENCOUNTER — Other Ambulatory Visit: Payer: Self-pay | Admitting: Internal Medicine

## 2019-05-05 DIAGNOSIS — K7031 Alcoholic cirrhosis of liver with ascites: Secondary | ICD-10-CM

## 2019-05-05 DIAGNOSIS — R1030 Lower abdominal pain, unspecified: Secondary | ICD-10-CM

## 2019-05-05 DIAGNOSIS — R350 Frequency of micturition: Secondary | ICD-10-CM

## 2019-05-05 MED FILL — ?FOLIC ACID 1MG TAB: 1 | 30 days supply | Qty: 30 | Fill #1

## 2019-05-07 ENCOUNTER — Other Ambulatory Visit: Payer: Self-pay

## 2019-05-07 ENCOUNTER — Ambulatory Visit: Payer: Self-pay | Attending: Internal Medicine | Admitting: Physician Assistant

## 2019-05-07 VITALS — BP 133/73 | HR 75 | Temp 98.0°F | Ht 65.0 in | Wt 210.0 lb

## 2019-05-07 DIAGNOSIS — R109 Unspecified abdominal pain: Secondary | ICD-10-CM

## 2019-05-07 DIAGNOSIS — Z758 Other problems related to medical facilities and other health care: Secondary | ICD-10-CM

## 2019-05-07 DIAGNOSIS — Z789 Other specified health status: Secondary | ICD-10-CM

## 2019-05-07 DIAGNOSIS — E876 Hypokalemia: Secondary | ICD-10-CM

## 2019-05-07 DIAGNOSIS — R519 Headache, unspecified: Secondary | ICD-10-CM

## 2019-05-07 DIAGNOSIS — K7031 Alcoholic cirrhosis of liver with ascites: Secondary | ICD-10-CM

## 2019-05-07 DIAGNOSIS — D649 Anemia, unspecified: Secondary | ICD-10-CM

## 2019-05-07 MED ORDER — IBUPROFEN 600 MG PO TABS: 600.0000 mg | ORAL_TABLET | Freq: Three times a day (TID) | ORAL | 0 refills | Status: DC | PRN

## 2019-05-07 MED ORDER — ONDANSETRON HCL 4 MG PO TABS: 4.0000 mg | ORAL_TABLET | Freq: Four times a day (QID) | ORAL | 1 refills | Status: DC | PRN

## 2019-05-07 MED FILL — ?ONDANSETRON HCL 4 MG TABLE: 4 | 7 days supply | Qty: 30 | Fill #0

## 2019-05-07 MED FILL — ?IBUPROFEN 600 MG TABLETS: 600 | 10 days supply | Qty: 30 | Fill #0

## 2019-05-07 NOTE — Progress Notes (Signed)
[address] Jason [name], is a 39 y.o. male  GYF:[id number]  FFM:[id number]  DOB - Mar 10, 1981  Subjective:  Chief Complaint and HPI: Jason [name] is a 39 y.o. male here today Patient is c/o HA on Monday.  Did not take anything for it.  No further HA since then no vision changes.  Also had abdominal pain at the time but now that has resolved.  No vomiting.  HA felt sharp pains and was frontal and constant.  No weakness or neurological s/sx.  No precipitating factors.  No aura.    No alcohol in 15 months.  Occasional nausea.  No vomiting.  No melena.  No hematochezia.  No new urinary s/sx.    Orpha Bur is interpreter.   ROS:   Constitutional:  No f/c, No night sweats, No unexplained weight loss. EENT:  No vision changes, No blurry vision, No hearing changes. No mouth, throat, or ear problems.  Respiratory: No cough, No SOB Cardiac: No CP, no palpitations GI:  No abd pain, No V/D. He does have occasional nausea GU: No Urinary s/sx Musculoskeletal: No joint pain Neuro: no dizziness, no motor weakness.  Skin: No rash Endocrine:  No polydipsia. No polyuria.  Psych: Denies SI/HI  No problems updated.  ALLERGIES: No Known Allergies  PAST MEDICAL HISTORY: Past Medical History:  Diagnosis Date  . Alcoholic cirrhosis of liver (HCC)   . Hypertension     MEDICATIONS AT HOME: Prior to Admission medications   Medication Sig Start Date End Date Taking? Authorizing Provider  acidophilus (RISAQUAD) CAPS capsule Take 1 capsule by mouth daily. 05/10/18  Yes Emokpae, Courage, MD  folic acid (FOLVITE) 1 MG tablet TAKE 1 TABLET (1 MG TOTAL) BY MOUTH DAILY. 04/07/19  Yes Marcine Matar, MD  furosemide (LASIX) 40 MG tablet Take 1 tablet (40 mg total) by mouth 2 (two) times daily. 03/06/19  Yes Fulp, Cammie, MD  lactulose, encephalopathy, (CHRONULAC) 10 GM/15ML SOLN TAKE 30 MLS (20 G TOTAL) BY MOUTH 2 (TWO) TIMES DAILY. 03/06/19  Yes Marcine Matar, MD  Multiple Vitamin (MULTIVITAMIN  WITH MINERALS) TABS tablet Take 1 tablet by mouth daily. 05/10/18  Yes Emokpae, Courage, MD  ondansetron (ZOFRAN) 4 MG tablet TAKE 1 TABLET (4 MG TOTAL) BY MOUTH EVERY 6 (SIX) HOURS AS NEEDED FOR NAUSEA OR VOMITING. 04/07/19  Yes Marcine Matar, MD  pantoprazole (PROTONIX) 40 MG tablet Take 1 tablet (40 mg total) by mouth daily before lunch. 03/06/19  Yes Fulp, Cammie, MD  potassium chloride SA (KLOR-CON) 20 MEQ tablet Take 1 tablet (20 mEq total) by mouth 2 (two) times daily. 03/06/19  Yes Fulp, Cammie, MD  spironolactone (ALDACTONE) 100 MG tablet Take 1 tablet (100 mg total) by mouth daily. 03/06/19  Yes Fulp, Cammie, MD  thiamine 100 MG tablet Take 1 tablet (100 mg total) by mouth daily. 06/10/18  Yes Marcine Matar, MD  ibuprofen (ADVIL) 600 MG tablet Take 1 tablet (600 mg total) by mouth every 8 (eight) hours as needed. Use sparingly 05/07/19   Anders Simmonds, PA-C     Objective:  EXAM:   Vitals:   05/07/19 1341  BP: 133/73  Pulse: 75  Temp: 98 F (36.7 C)  TempSrc: Oral  SpO2: 100%  Weight: 210 lb (95.3 kg)  Height: 5\' 5"  (1.651 m)    General appearance : A&OX3. NAD. Non-toxic-appearing HEENT: Atraumatic and Normocephalic.  PERRLA. EOM intact.   Neck: supple, no JVD. No cervical lymphadenopathy. No thyromegaly Chest/Lungs:  Breathing-non-labored,  Good air entry bilaterally, breath sounds normal without rales, rhonchi, or wheezing  CVS: S1 S2 regular, no murmurs, gallops, rubs  Abdomen: Bowel sounds present, Non tender and not distended with no gaurding, rigidity or rebound. Extremities: Bilateral Lower Ext shows no edema, both legs are warm to touch with = pulse throughout Neurology:  CN II-XII grossly intact, Non focal.   Psych:  TP linear. J/I WNL. Normal speech. Appropriate eye contact and affect.  Skin:  No Rash  Data Review Lab Results  Component Value Date   HGBA1C 4.7 03/06/2019     Assessment & Plan   1. Nonintractable headache, unspecified chronicity  pattern, unspecified headache type No red flags today. To ED if develops severe HA.  Patient verbalizes understanding - Comprehensive metabolic panel - TSH - ibuprofen (ADVIL) 600 MG tablet; Take 1 tablet (600 mg total) by mouth every 8 (eight) hours as needed. Use sparingly  Dispense: 30 tablet; Refill: 0  2. Alcoholic cirrhosis of liver with ascites (HCC) No alcohol in 15 months!!  Keep up the good work - Comprehensive metabolic panel  3. Hypokalemia - Comprehensive metabolic panel  4. Anemia, unspecified type - CBC with Differential  5. Language barrier stratus interpreters used and additional time performing visit was required.   6. Abdominal pain, unspecified abdominal location resolved  Patient have been counseled extensively about nutrition and exercise  Return in about 2 months (around 07/05/2019) for pcp appt.  The patient was given clear instructions to go to ER or return to medical center if symptoms don't improve, worsen or new problems develop. The patient verbalized understanding. The patient was told to call to get lab results if they haven't heard anything in the next week.     Freeman Caldron, PA-C [facility] [place], [place]   05/07/2019, 2:03 PM

## 2019-05-07 NOTE — Patient Instructions (Signed)
Dolor de cabeza general sin causa General Headache Without Cause El dolor de cabeza es un dolor o malestar que se siente en la zona de la cabeza o del cuello. Hay muchas causas y tipos de dolores de cabeza. En algunos casos, es posible que no se encuentre la causa. Siga estas indicaciones en su casa: Controle su afeccin para detectar cualquier cambio. Infrmele a su mdico acerca de los cambios. Siga estos pasos para controlar su afeccin: Control del dolor      Tome los medicamentos de venta libre y los recetados solamente como se lo haya indicado el mdico.  Cuando sienta dolor de cabeza acustese en un cuarto oscuro y tranquilo.  Si se lo indican, aplquese hielo en la cabeza y en la zona del cuello: ? Ponga el hielo en una bolsa plstica. ? Coloque una toalla entre la piel y la bolsa. ? Coloque el hielo durante 20minutos, 2a3veces al da.  Si se lo indican, aplique calor en la zona afectada. Use la fuente de calor que el mdico le recomiende, como una compresa de calor hmedo o una almohadilla trmica. ? Coloque una toalla entre la piel y la fuente de calor. ? Aplique calor durante 20 a 30minutos. ? Retire la fuente de calor si la piel se pone de color rojo brillante. Esto es muy importante si no puede sentir dolor, calor o fro. Puede correr un riesgo mayor de sufrir quemaduras.  Mantenga las luces tenues si las luces brillantes le molestan o sus dolores de cabeza empeoran. Comida y bebida  Mantenga un horario para las comidas.  Si bebe alcohol: ? Limite la cantidad que bebe a lo siguiente:  De 0 a 1 medida por da para las mujeres.  De 0 a 2 medidas por da para los hombres. ? Est atento a la cantidad de alcohol que hay en las bebidas que toma. En [place], una medida equivale a una botella de cerveza de 12oz (355ml), un vaso de vino de 5oz (148ml) o un vaso de una bebida alcohlica de alta graduacin de 1oz (44ml).  Deje de tomar cafena o reduzca  la cantidad que consume. Indicaciones generales   Lleve un registro diario para averiguar si ciertas cosas provocan los dolores de cabeza. Registre, por ejemplo, lo siguiente: ? Lo que usted come y bebe. ? El tiempo que duerme. ? Algn cambio en su dieta o en los medicamentos.  Hgase masajes o pruebe otras formas de relajarse.  Limite el estrs.  Sintese con la espalda recta. No contraiga (tensione) los msculos.  No consuma ningn producto que contenga nicotina o tabaco. Estos incluyen los cigarrillos, el tabaco para mascar y los cigarrillos electrnicos. Si necesita ayuda para dejar de fumar, consulte al mdico.  Haga ejercicios con regularidad tal como se lo indic el mdico.  Duerma lo suficiente. Esto a menudo significa entre 7 y 9horas de sueo cada noche.  Concurra a todas las visitas de control como se lo haya indicado el mdico. Esto es importante. Comunquese con un mdico si:  Los medicamentos no logran aliviar los sntomas.  Tiene un dolor de cabeza que es diferente a los otros dolores de cabeza.  Tiene malestar estomacal (nuseas) o vomita.  Tiene fiebre. Solicite ayuda inmediatamente si:  El dolor de cabeza empeora rpidamente.  El dolor empeora despus de hacer mucha actividad fsica.  Sigue vomitando.  Presenta rigidez en el cuello.  Tiene dificultad para ver.  Tiene dificultad para hablar.  Siente dolor en el ojo   o en el odo.  Sus msculos estn dbiles, o pierde el control muscular.  Pierde el equilibrio o tiene problemas para caminar.  Siente que va a desvanecerse (perder el conocimiento) o se desmaya.  Est desorientado (confundido).  Tiene una convulsin. Resumen  El dolor de cabeza es un dolor o malestar que se siente en la zona de la cabeza o del cuello.  Hay muchas causas y tipos de dolores de cabeza. En algunos casos, es posible que no se encuentre la causa.  Lleve un diario como ayuda para determinar la causa de los dolores  de cabeza. Controle su afeccin para detectar cualquier cambio. Infrmele a su mdico acerca de los cambios.  Comunquese con un mdico si tiene un dolor de cabeza que es diferente de lo habitual o si el dolor de cabeza no se alivia con los medicamentos.  Solicite ayuda de inmediato si el dolor de cabeza es muy intenso, vomita, tiene dificultad para ver, pierde el equilibrio o tiene una convulsin. Esta informacin no tiene como fin reemplazar el consejo del mdico. Asegrese de hacerle al mdico cualquier pregunta que tenga. Document Revised: 12/18/2017 Document Reviewed: 12/18/2017 Elsevier Patient Education  2020 Elsevier Inc.  

## 2019-05-08 ENCOUNTER — Telehealth (INDEPENDENT_AMBULATORY_CARE_PROVIDER_SITE_OTHER): Payer: Self-pay

## 2019-05-08 ENCOUNTER — Other Ambulatory Visit: Payer: Self-pay | Admitting: Physician Assistant

## 2019-05-08 DIAGNOSIS — D696 Thrombocytopenia, unspecified: Secondary | ICD-10-CM

## 2019-05-08 DIAGNOSIS — K7031 Alcoholic cirrhosis of liver with ascites: Secondary | ICD-10-CM

## 2019-05-08 LAB — CBC WITH DIFFERENTIAL/PLATELET
Basophils Absolute: 0 10*3/uL (ref 0.0–0.2)
Basos: 1 %
EOS (ABSOLUTE): 0.1 10*3/uL (ref 0.0–0.4)
Eos: 2 %
Hematocrit: 37.6 % (ref 37.5–51.0)
Hemoglobin: 13.6 g/dL (ref 13.0–17.7)
Immature Grans (Abs): 0 10*3/uL (ref 0.0–0.1)
Immature Granulocytes: 0 %
Lymphocytes Absolute: 1.6 10*3/uL (ref 0.7–3.1)
Lymphs: 30 %
MCH: 34.2 pg — ABNORMAL HIGH (ref 26.6–33.0)
MCHC: 36.2 g/dL — ABNORMAL HIGH (ref 31.5–35.7)
MCV: 95 fL (ref 79–97)
Monocytes Absolute: 0.8 10*3/uL (ref 0.1–0.9)
Monocytes: 16 %
Neutrophils Absolute: 2.7 10*3/uL (ref 1.4–7.0)
Neutrophils: 51 %
Platelets: 33 10*3/uL — CL (ref 150–450)
RBC: 3.98 x10E6/uL — ABNORMAL LOW (ref 4.14–5.80)
RDW: 15.4 % (ref 11.6–15.4)
WBC: 5.3 10*3/uL (ref 3.4–10.8)

## 2019-05-08 LAB — COMPREHENSIVE METABOLIC PANEL
ALT: 48 IU/L — ABNORMAL HIGH (ref 0–44)
AST: 73 IU/L — ABNORMAL HIGH (ref 0–40)
Albumin/Globulin Ratio: 1 — ABNORMAL LOW (ref 1.2–2.2)
Albumin: 3.2 g/dL — ABNORMAL LOW (ref 4.0–5.0)
Alkaline Phosphatase: 266 IU/L — ABNORMAL HIGH (ref 39–117)
BUN/Creatinine Ratio: 19 (ref 9–20)
BUN: 12 mg/dL (ref 6–20)
Bilirubin Total: 2.8 mg/dL — ABNORMAL HIGH (ref 0.0–1.2)
CO2: 21 mmol/L (ref 20–29)
Calcium: 8.8 mg/dL (ref 8.7–10.2)
Chloride: 109 mmol/L — ABNORMAL HIGH (ref 96–106)
Creatinine, Ser: 0.62 mg/dL — ABNORMAL LOW (ref 0.76–1.27)
GFR calc Af Amer: 148 mL/min/{1.73_m2} (ref >59)
GFR calc non Af Amer: 128 mL/min/{1.73_m2} (ref >59)
Globulin, Total: 3.1 g/dL (ref 1.5–4.5)
Glucose: 90 mg/dL (ref 65–99)
Potassium: 3.8 mmol/L (ref 3.5–5.2)
Sodium: 139 mmol/L (ref 134–144)
Total Protein: 6.3 g/dL (ref 6.0–8.5)

## 2019-05-08 LAB — TSH: TSH: 0.708 u[IU]/mL (ref 0.450–4.500)

## 2019-05-08 MED ORDER — LACTULOSE ENCEPHALOPATHY 10 GM/15ML PO SOLN: ORAL | 1 refills | Status: DC

## 2019-05-08 MED FILL — LACTULOSE 10 GM/15 ML SOLN: 10 | 7 days supply | Qty: 473 | Fill #0

## 2019-05-08 NOTE — Telephone Encounter (Signed)
-----   Message from Anders Simmonds, [place] sent at 05/08/2019  8:22 AM EST ----- Please call patient.  Platelets are very low and liver function is still poor.  I am referring him to hematology.  If he has any abnormal bleeding or a fall he should go to the ED bc he is at risk for abnormal bleeding.  Lactulose was RF and he should continue taking that.  Please make him an appointment with his PCP in 3 weeks for further instruction.  Thanks, Georgian Co, PA-C

## 2019-05-08 NOTE — Telephone Encounter (Signed)
Call placed to patient using language interpreter Bethena Midget [phone]) he verified his date of birth. He is aware that Platelets are very low and liver function is still poor. I am referring him to hematology. Instructed him to go to the emergency room If he has any abnormal bleeding or a fall because  he is at risk for abnormal bleeding. Lactulose was RF and he should continue taking that. Patient verbalized understanding of results after having all questions answered. Maryjean Morn, CMA

## 2019-05-11 MED FILL — POTASSIUM CL ER 20 MEQ TABL: 20 | 30 days supply | Qty: 60 | Fill #2

## 2019-05-11 MED FILL — ?FUROSEMIDE 40 MG TABLET: 40 | 30 days supply | Qty: 60 | Fill #2

## 2019-05-11 MED FILL — PANTOPRAZOLE SOD DR 40 MG T: 40 | 30 days supply | Qty: 30 | Fill #2

## 2019-05-11 MED FILL — ?SPIRONOLACTONE 100MG TAB: 100 | 30 days supply | Qty: 30 | Fill #2

## 2019-05-25 MED FILL — LACTULOSE 10 GM/15 ML SOLN: 10 | 7 days supply | Qty: 473 | Fill #1

## 2019-05-29 ENCOUNTER — Telehealth: Payer: Self-pay | Admitting: Oncology

## 2019-05-29 ENCOUNTER — Encounter: Payer: Self-pay | Admitting: Oncology

## 2019-05-29 NOTE — Telephone Encounter (Signed)
A new hem appt has been scheduled for the pt to see Dr. Clelia Croft on 2/11 at 2pm. I notified the referring office to notify the pt. Letter mailed.

## 2019-06-01 ENCOUNTER — Encounter (HOSPITAL_COMMUNITY): Payer: Self-pay

## 2019-06-01 ENCOUNTER — Emergency Department (HOSPITAL_COMMUNITY): Payer: Self-pay

## 2019-06-01 ENCOUNTER — Emergency Department (HOSPITAL_COMMUNITY)
Admission: EM | Admit: 2019-06-01 | Discharge: 2019-06-01 | Disposition: A | Discharge status: Home / Self Care | Payer: Self-pay | Attending: Emergency Medicine | Admitting: Emergency Medicine

## 2019-06-01 ENCOUNTER — Other Ambulatory Visit: Payer: Self-pay

## 2019-06-01 DIAGNOSIS — M545 Low back pain: Secondary | ICD-10-CM | POA: Insufficient documentation

## 2019-06-01 DIAGNOSIS — Y9301 Activity, walking, marching and hiking: Secondary | ICD-10-CM | POA: Insufficient documentation

## 2019-06-01 DIAGNOSIS — W19XXXA Unspecified fall, initial encounter: Secondary | ICD-10-CM

## 2019-06-01 DIAGNOSIS — S82841A Displaced bimalleolar fracture of right lower leg, initial encounter for closed fracture: Secondary | ICD-10-CM | POA: Insufficient documentation

## 2019-06-01 DIAGNOSIS — W109XXA Fall (on) (from) unspecified stairs and steps, initial encounter: Secondary | ICD-10-CM | POA: Insufficient documentation

## 2019-06-01 DIAGNOSIS — Y999 Unspecified external cause status: Secondary | ICD-10-CM | POA: Insufficient documentation

## 2019-06-01 DIAGNOSIS — Z79899 Other long term (current) drug therapy: Secondary | ICD-10-CM | POA: Insufficient documentation

## 2019-06-01 DIAGNOSIS — I10 Essential (primary) hypertension: Secondary | ICD-10-CM | POA: Insufficient documentation

## 2019-06-01 DIAGNOSIS — Y929 Unspecified place or not applicable: Secondary | ICD-10-CM | POA: Insufficient documentation

## 2019-06-01 MED ORDER — OXYCODONE HCL 5 MG PO CAPS: 5.0000 mg | ORAL_CAPSULE | Freq: Four times a day (QID) | ORAL | 0 refills | Status: DC | PRN

## 2019-06-01 MED ORDER — OXYCODONE HCL 5 MG PO TABS
5.0000 mg | ORAL_TABLET | Freq: Once | ORAL | Status: AC
  Administered 2019-06-01: 5 mg via ORAL
  Filled 2019-06-01: qty 1
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
## 2019-06-01 NOTE — Discharge Instructions (Addendum)
Please read and follow all provided instructions.  You have been seen today after an injury to your right ankle- this is fracture in two places.  We have placed you in a splint- please keep this clean & dry and intact on your leg until you have followed up with orthopedics. Do not put any weight on this extremity Please call orthopedic surgery tomorrow to ensure your appointment this Wednesday (06/03/19) at 8:30 AM.   Home care instructions: -- *PRICE in the first 24-48 hours after injury: Protect with splint Rest Ice- Do not apply ice pack directly to your splint place towel or similar between your splint and ice/ice pack. Apply ice for 20 min, then remove for 40 min while awake Compression- splint Elevate affected extremity above the level of your heart when not walking around for the first 24-48 hours   Medications:  Please take ibuprofen per over the counter dosing to help with pain/swelling.  If your pain is not alleviated by ibuprofen please take percocet.  -Oxycodone-this is a narcotic/controlled substance medication that has potential addicting qualities.  We recommend that you take 1-2 tablets every 6 hours as needed for severe pain.  Do not drive or operate heavy machinery when taking this medicine as it can be sedating. Do not drink alcohol or take other sedating medications when taking this medicine for safety reasons.  Keep this out of reach of small children.    We have prescribed you new medication(s) today. Discuss the medications prescribed today with your pharmacist as they can have adverse effects and interactions with your other medicines including over the counter and prescribed medications. Seek medical evaluation if you start to experience new or abnormal symptoms after taking one of these medicines, seek care immediately if you start to experience difficulty breathing, feeling of your throat closing, facial swelling, or rash as these could be indications of a more serious  allergic reaction  Follow-up instructions: Please follow-up with the orthopedic surgeon in your discharge instructions this Wednesday @ 8:30 (06/03/19).   Return instructions:  Please return if your digits or extremity are numb or tingling, appear gray or blue, or you have severe pain (also elevate the extremity and loosen splint or wrap if you were given one) Please return if you have redness or fevers.  Please return to the Emergency Department if you experience worsening symptoms.  Please return if you have any other emergent concerns. Additional Information:  Your vital signs today were: BP (!) 146/75 (BP Location: Right Arm)   Pulse 84   Temp 98 F (36.7 C) (Oral)   Resp 16   Ht 5' 5" (1.651 m)   Wt 95.3 kg   SpO2 99%   BMI 34.95 kg/m  If your blood pressure (BP) was elevated above 135/85 this visit, please have this repeated by your doctor within one month. ---------------      English to Spanish translation:  Lea y siga todas las instrucciones proporcionadas.  Lo han visto hoy despus de una lesin en el tobillo derecho; se trata de Academic librarian. Le hemos colocado una frula; mantngala limpia, seca e intacta en la pierna hasta que haya realizado un seguimiento con la ortopedia. No ponga ningn peso sobre esta extremidad Por favor llame a ciruga ortopdica maana para asegurar su cita este mircoles (06/03/19) a las 8:30 AM.  Instrucciones de cuidado en el hogar: - * PRECIO en las primeras 24-48 horas despus de la lesin: Proteger con frula Descanso Hielo:  no aplique la compresa de hielo directamente en la toalla de lugar de la frula o similar entre la frula y la compresa de hielo / hielo. Aplique hielo durante 20 minutos, luego retrelo durante 40 minutos mientras est despierto Frula de compresin [employer] extremidad afectada por encima del nivel de su corazn cuando no camine durante las primeras 24-48 horas  Medicamentos: Tome ibuprofeno en  dosis de venta libre para Best boy o la hinchazn. Si su dolor no se alivia con ibuprofeno, tome percocet. -Oxicodona: este es un medicamento narctico / sustancia controlada que tiene propiedades adictivas potenciales. Le recomendamos que tome 1-2 comprimidos cada 6 horas segn sea necesario para el dolor intenso. No conduzca ni maneje maquinaria pesada mientras toma este medicamento, ya que puede ser sedante. No beba alcohol ni tome otros medicamentos sedantes al tomar este medicamento por razones de seguridad. Mantenga esto fuera del alcance de los nios pequeos.  Hoy le hemos recetado nuevos medicamentos. Discuta los medicamentos recetados hoy con su farmacutico, ya que pueden tener efectos adversos e interacciones con sus otros medicamentos, incluidos los medicamentos recetados y de [place]. Busque una evaluacin mdica si comienza a experimentar sntomas nuevos o anormales despus de tomar uno de estos medicamentos, busque atencin mdica de inmediato si comienza a experimentar dificultad para respirar, sensacin de cierre de la garganta, hinchazn facial o sarpullido, ya que estos podran ser indicios de un problema ms grave reaccin alrgica   Instrucciones de seguimiento: Haga un seguimiento con el cirujano ortopdico en sus instrucciones de alta este mircoles a las 8:30 (06/03/19).  Instrucciones de devolucin: 1. Regrese si sus dedos o extremidad estn entumecidos u hormiguean, se ven grises o azules, o si tiene dolor severo (tambin [employer] la extremidad y afloje la frula o venda si le dieron [place]) 2. Regrese si tiene enrojecimiento o fiebre. 3. Regrese al Departamento de Emergencias si experimenta un empeoramiento de los sntomas. 4. Regrese si tiene otras inquietudes emergentes. Informacin Adicional:  Tus signos vitales de hoy fueron: BP (!) 146/75 (Ubicacin de BP: Brazo derecho)  Pulse 84  Temp. 98  F (36.7  C) (Oral)  Resp 16  Estatura 5 '5 "(1.651 m)  Peso  95.3 kg  SpO2 99%  IMC 34.95 kg / m Si su presin arterial (PA) se elev por encima de 135/85 en esta visita, pdale a su mdico que lo repita dentro de un mes. ---------------

## 2019-06-01 NOTE — ED Triage Notes (Signed)
Patient states he was walking down a flight of steps and stepped on a water bottle. Patient reports that he  Right foot bent backwards and he twisted his right ankle.

## 2019-06-02 ENCOUNTER — Telehealth: Payer: Self-pay | Admitting: Internal Medicine

## 2019-06-02 NOTE — Telephone Encounter (Signed)
Pt called since he when to the ED on 06/01/19 due to an accident at the job, [facility] Jason [name] is a 39 y.o. male with a history of hypertension and cirrhosis who presents to the emergency department status post fall shortly prior to arrival with complaints of right ankle pain.  Patient states he was walking down the steps, misstep, and fell.  He fell down a few steps.  He did not hit his head or lose consciousness.  He states having pain primarily to the right ankle as well as a bit to the right knee into the lower back.  Pain is worse with movements and attempts to bear weight.  No alleviating factors.  He denies numbness, tingling, weakness, headache, neck pain, chest pain, or abdominal pain.  Denies blood thinner use., He is calling to inform the PCP about his accident

## 2019-06-03 ENCOUNTER — Other Ambulatory Visit: Payer: Self-pay

## 2019-06-03 ENCOUNTER — Encounter (HOSPITAL_BASED_OUTPATIENT_CLINIC_OR_DEPARTMENT_OTHER): Payer: Self-pay | Admitting: Orthopedic Surgery

## 2019-06-03 NOTE — Progress Notes (Signed)
Spoke with:  Sante NPO: No food after midnight/Clear liquids until 8:30 AM DOS Arrival time: 0930 AM Labs: Istat 8, EKG (ECHO 05/03/2018 in epic) AM medications: Pantoprazole Pre op orders: Yes Ride home: Tonye Becket (brother) [phone]

## 2019-06-03 NOTE — Progress Notes (Signed)
PCP - Dr. Timoteo Expose Cardiologist - N/A  Chest x-ray - N/A EKG - N/A Stress Test -  N/A ECHO -  N/A Cardiac Cath -  N/A  Sleep Study -  N/A CPAP -  N/A  Fasting Blood Sugar -  N/A Checks Blood Sugar _ N/A____ times a day  Blood Thinner Instructions:   N/A Aspirin Instructions:  N/A  Last Dose:  N/A  Anesthesia review:  N/A  Patient denies shortness of breath, fever, cough and chest pain at PAT appointment   Patient verbalized understanding of instructions that were given to them at the PAT appointment. Patient was also instructed that they will need to review over the PAT instructions again at home before surgery.

## 2019-06-05 ENCOUNTER — Other Ambulatory Visit (HOSPITAL_COMMUNITY)
Admission: RE | Admit: 2019-06-05 | Discharge: 2019-06-05 | Disposition: A | Discharge status: Home / Self Care | Payer: Self-pay | Source: Ambulatory Visit | Attending: Orthopedic Surgery | Admitting: Orthopedic Surgery

## 2019-06-05 DIAGNOSIS — Z01812 Encounter for preprocedural laboratory examination: Secondary | ICD-10-CM | POA: Insufficient documentation

## 2019-06-05 DIAGNOSIS — Z20822 Contact with and (suspected) exposure to covid-19: Secondary | ICD-10-CM | POA: Insufficient documentation

## 2019-06-05 LAB — SARS CORONAVIRUS 2 (TAT 6-24 HRS): SARS Coronavirus 2: NEGATIVE

## 2019-06-08 NOTE — Progress Notes (Signed)
Spoke with Bonney Leitz, surgery scheduler for Dr. Wandra Feinstein to clarify order for MRSA swab. She spoke with Hunt Oris, PA-C and he said order for MRSA swab is not needed, so order discontinued.

## 2019-06-08 NOTE — H&P (Signed)
[name] [name]   Jason [name] [place] [address] [phone] A Division of [facility]  RE: Jason, [name]                                  [id number]         DOB: 1981/01/06 INITIAL EVALUATION 06/03/2019  Reason for visit:  Right ankle injury.   HPI: He fell down some [name] and was seen in the [facility] ER with a right ankle fracture and referred to me.  This occurred on 06/01/2019. I reviewed the x-rays.  He definitely has an acute bimalleolar equivalent fracture.  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    OBJECTIVE: The patient is a well appearing male, in no apparent distress.  Neurovascularly intact to the toes.  Painless passive motion here.    IMAGES: None today.    ASSESSMENT/PLAN:  Right ankle fracture.  I recommend open reduction and internal fixation of this.  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  I do not think we would be helping him at all putting hardware on the medial side.  We will primarily focus on fixing the lateral side.     Jason [name].  Jason [name], M.D.  Electronically verified by Jason [name]. Jason [name], M.D. TDM:pmw D 06/03/19 T 06/05/19

## 2019-06-09 ENCOUNTER — Ambulatory Visit (HOSPITAL_BASED_OUTPATIENT_CLINIC_OR_DEPARTMENT_OTHER): Payer: Self-pay | Admitting: Anesthesiology

## 2019-06-09 ENCOUNTER — Encounter (HOSPITAL_BASED_OUTPATIENT_CLINIC_OR_DEPARTMENT_OTHER): Payer: Self-pay | Admitting: Orthopedic Surgery

## 2019-06-09 ENCOUNTER — Other Ambulatory Visit: Payer: Self-pay

## 2019-06-09 ENCOUNTER — Encounter (HOSPITAL_BASED_OUTPATIENT_CLINIC_OR_DEPARTMENT_OTHER)
Admission: RE | Disposition: A | Discharge status: Home / Self Care | Payer: Self-pay | Source: Home / Self Care | Attending: Orthopedic Surgery

## 2019-06-09 ENCOUNTER — Ambulatory Visit (HOSPITAL_BASED_OUTPATIENT_CLINIC_OR_DEPARTMENT_OTHER)
Admission: RE | Admit: 2019-06-09 | Discharge: 2019-06-09 | Disposition: A | Discharge status: Home / Self Care | Payer: Self-pay | Attending: Orthopedic Surgery | Admitting: Orthopedic Surgery

## 2019-06-09 DIAGNOSIS — I10 Essential (primary) hypertension: Secondary | ICD-10-CM | POA: Insufficient documentation

## 2019-06-09 DIAGNOSIS — Z6835 Body mass index (BMI) 35.0-35.9, adult: Secondary | ICD-10-CM | POA: Insufficient documentation

## 2019-06-09 DIAGNOSIS — K219 Gastro-esophageal reflux disease without esophagitis: Secondary | ICD-10-CM | POA: Insufficient documentation

## 2019-06-09 DIAGNOSIS — Z87891 Personal history of nicotine dependence: Secondary | ICD-10-CM | POA: Insufficient documentation

## 2019-06-09 DIAGNOSIS — S82891A Other fracture of right lower leg, initial encounter for closed fracture: Secondary | ICD-10-CM

## 2019-06-09 DIAGNOSIS — E669 Obesity, unspecified: Secondary | ICD-10-CM | POA: Insufficient documentation

## 2019-06-09 DIAGNOSIS — W109XXA Fall (on) (from) unspecified stairs and steps, initial encounter: Secondary | ICD-10-CM | POA: Insufficient documentation

## 2019-06-09 DIAGNOSIS — S82841A Displaced bimalleolar fracture of right lower leg, initial encounter for closed fracture: Secondary | ICD-10-CM | POA: Insufficient documentation

## 2019-06-09 HISTORY — DX: Nocturia: R35.1

## 2019-06-09 HISTORY — PX: ORIF ANKLE FRACTURE: SHX5408

## 2019-06-09 HISTORY — DX: Gastro-esophageal reflux disease without esophagitis: K21.9

## 2019-06-09 HISTORY — DX: Other fracture of unspecified lower leg, initial encounter for closed fracture: S82.899A

## 2019-06-09 HISTORY — DX: Thrombocytopenia, unspecified: D69.6

## 2019-06-09 HISTORY — DX: Anemia, unspecified: D64.9

## 2019-06-09 LAB — CBC
HCT: 39.2 % (ref 39.0–52.0)
Hemoglobin: 13.2 g/dL (ref 13.0–17.0)
MCH: 33.8 pg (ref 26.0–34.0)
MCHC: 33.7 g/dL (ref 30.0–36.0)
MCV: 100.3 fL — ABNORMAL HIGH (ref 80.0–100.0)
Platelets: 31 10*3/uL — ABNORMAL LOW (ref 150–400)
RBC: 3.91 MIL/uL — ABNORMAL LOW (ref 4.22–5.81)
RDW: 15.6 % — ABNORMAL HIGH (ref 11.5–15.5)
WBC: 5 10*3/uL (ref 4.0–10.5)
nRBC: 0 % (ref 0.0–0.2)

## 2019-06-09 LAB — TYPE AND SCREEN
ABO/RH(D): A POS
Antibody Screen: NEGATIVE

## 2019-06-09 LAB — POCT I-STAT, CHEM 8
BUN: 12 mg/dL (ref 6–20)
Calcium, Ion: 1.3 mmol/L (ref 1.15–1.40)
Chloride: 108 mmol/L (ref 98–111)
Creatinine, Ser: 0.4 mg/dL — ABNORMAL LOW (ref 0.61–1.24)
Glucose, Bld: 100 mg/dL — ABNORMAL HIGH (ref 70–99)
HCT: 37 % — ABNORMAL LOW (ref 39.0–52.0)
Hemoglobin: 12.6 g/dL — ABNORMAL LOW (ref 13.0–17.0)
Potassium: 3.9 mmol/L (ref 3.5–5.1)
Sodium: 141 mmol/L (ref 135–145)
TCO2: 22 mmol/L (ref 22–32)

## 2019-06-09 LAB — PROTIME-INR
INR: 1.4 — ABNORMAL HIGH (ref 0.8–1.2)
Prothrombin Time: 17.4 seconds — ABNORMAL HIGH (ref 11.4–15.2)

## 2019-06-09 SURGERY — OPEN REDUCTION INTERNAL FIXATION (ORIF) ANKLE FRACTURE
Anesthesia: Monitor Anesthesia Care | Site: Ankle | Laterality: Right

## 2019-06-09 MED ORDER — 0.9 % SODIUM CHLORIDE (POUR BTL) OPTIME
TOPICAL | Status: DC | PRN
  Administered 2019-06-09: 500 mL

## 2019-06-09 MED ORDER — GABAPENTIN 300 MG PO CAPS
ORAL_CAPSULE | ORAL | Status: AC
  Filled 2019-06-09: qty 1

## 2019-06-09 MED ORDER — PROPOFOL 500 MG/50ML IV EMUL
INTRAVENOUS | Status: DC | PRN
  Administered 2019-06-09: 100 ug/kg/min via INTRAVENOUS

## 2019-06-09 MED ORDER — ROPIVACAINE HCL 5 MG/ML IJ SOLN: INTRAMUSCULAR | Status: DC | PRN

## 2019-06-09 MED ORDER — CEFAZOLIN SODIUM-DEXTROSE 2-4 GM/100ML-% IV SOLN
INTRAVENOUS | Status: AC
  Filled 2019-06-09: qty 100

## 2019-06-09 MED ORDER — MIDAZOLAM HCL 5 MG/5ML IJ SOLN
INTRAMUSCULAR | Status: DC | PRN
  Administered 2019-06-09 (×2): 1 mg via INTRAVENOUS
  Administered 2019-06-09: 2 mg via INTRAVENOUS

## 2019-06-09 MED ORDER — BACLOFEN 10 MG PO TABS: 10.0000 mg | ORAL_TABLET | Freq: Three times a day (TID) | ORAL | 0 refills | Status: DC | PRN

## 2019-06-09 MED ORDER — ACETAMINOPHEN 500 MG PO TABS
ORAL_TABLET | ORAL | Status: AC
  Filled 2019-06-09: qty 2

## 2019-06-09 MED ORDER — MIDAZOLAM HCL 2 MG/2ML IJ SOLN
INTRAMUSCULAR | Status: AC
  Filled 2019-06-09: qty 2

## 2019-06-09 MED ORDER — GABAPENTIN 300 MG PO CAPS
300.0000 mg | ORAL_CAPSULE | Freq: Once | ORAL | Status: AC
  Administered 2019-06-09: 300 mg via ORAL
  Filled 2019-06-09: qty 1

## 2019-06-09 MED ORDER — CELECOXIB 200 MG PO CAPS
ORAL_CAPSULE | ORAL | Status: AC
  Filled 2019-06-09: qty 1

## 2019-06-09 MED ORDER — LIDOCAINE 2% (20 MG/ML) 5 ML SYRINGE
INTRAMUSCULAR | Status: AC
  Filled 2019-06-09: qty 5

## 2019-06-09 MED ORDER — OXYCODONE HCL 5 MG PO TABS: 5.0000 mg | ORAL_TABLET | ORAL | 0 refills | Status: AC | PRN

## 2019-06-09 MED ORDER — OXYCODONE HCL 5 MG/5ML PO SOLN
5.0000 mg | Freq: Once | ORAL | Status: DC | PRN
  Filled 2019-06-09: qty 5

## 2019-06-09 MED ORDER — CELECOXIB 200 MG PO CAPS
200.0000 mg | ORAL_CAPSULE | Freq: Once | ORAL | Status: AC
  Administered 2019-06-09: 10:00:00 200 mg via ORAL
  Filled 2019-06-09: qty 1

## 2019-06-09 MED ORDER — OXYCODONE HCL 5 MG PO TABS
5.0000 mg | ORAL_TABLET | Freq: Once | ORAL | Status: DC | PRN
  Filled 2019-06-09: qty 1

## 2019-06-09 MED ORDER — ONDANSETRON HCL 4 MG/2ML IJ SOLN
INTRAMUSCULAR | Status: AC
  Filled 2019-06-09: qty 2

## 2019-06-09 MED ORDER — DEXAMETHASONE SODIUM PHOSPHATE 4 MG/ML IJ SOLN: INTRAMUSCULAR | Status: DC | PRN

## 2019-06-09 MED ORDER — FENTANYL CITRATE (PF) 100 MCG/2ML IJ SOLN
INTRAMUSCULAR | Status: DC | PRN
  Administered 2019-06-09 (×4): 25 ug via INTRAVENOUS

## 2019-06-09 MED ORDER — FENTANYL CITRATE (PF) 100 MCG/2ML IJ SOLN
INTRAMUSCULAR | Status: AC
  Filled 2019-06-09: qty 2

## 2019-06-09 MED ORDER — PROPOFOL 500 MG/50ML IV EMUL
INTRAVENOUS | Status: AC
  Filled 2019-06-09: qty 50

## 2019-06-09 MED ORDER — CEFAZOLIN SODIUM-DEXTROSE 2-4 GM/100ML-% IV SOLN
2.0000 g | INTRAVENOUS | Status: AC
  Administered 2019-06-09: 12:00:00 2 g via INTRAVENOUS
  Filled 2019-06-09: qty 100

## 2019-06-09 MED ORDER — OXYCODONE HCL 5 MG PO TABS: 5.0000 mg | ORAL_TABLET | ORAL | 0 refills | Status: DC | PRN

## 2019-06-09 MED ORDER — ACETAMINOPHEN 325 MG PO TABS
650.0000 mg | ORAL_TABLET | Freq: Once | ORAL | Status: DC
  Filled 2019-06-09: qty 2

## 2019-06-09 MED ORDER — ENSURE PRE-SURGERY PO LIQD
296.0000 mL | Freq: Once | ORAL | Status: DC
  Filled 2019-06-09: qty 296

## 2019-06-09 MED ORDER — CHLORHEXIDINE GLUCONATE 4 % EX LIQD
60.0000 mL | Freq: Once | CUTANEOUS | Status: DC
  Filled 2019-06-09: qty 118

## 2019-06-09 MED ORDER — ACETAMINOPHEN 160 MG/5ML PO SOLN
ORAL | Status: AC
  Filled 2019-06-09: qty 20.3

## 2019-06-09 MED ORDER — HYDROMORPHONE HCL 1 MG/ML IJ SOLN
0.2500 mg | INTRAMUSCULAR | Status: DC | PRN
  Filled 2019-06-09: qty 0.5

## 2019-06-09 MED ORDER — ONDANSETRON HCL 4 MG/2ML IJ SOLN
INTRAMUSCULAR | Status: DC | PRN
  Administered 2019-06-09: 4 mg via INTRAVENOUS

## 2019-06-09 MED ORDER — KETOROLAC TROMETHAMINE 30 MG/ML IJ SOLN
30.0000 mg | Freq: Once | INTRAMUSCULAR | Status: DC | PRN
  Filled 2019-06-09: qty 1

## 2019-06-09 MED ORDER — ONDANSETRON HCL 4 MG/2ML IJ SOLN
4.0000 mg | Freq: Once | INTRAMUSCULAR | Status: DC | PRN
  Filled 2019-06-09: qty 2

## 2019-06-09 MED ORDER — PROPOFOL 10 MG/ML IV BOLUS
INTRAVENOUS | Status: DC | PRN
  Administered 2019-06-09 (×2): 20 ug via INTRAVENOUS

## 2019-06-09 MED ORDER — MIDAZOLAM HCL 2 MG/2ML IJ SOLN
0.5000 mg | Freq: Once | INTRAMUSCULAR | Status: AC
  Administered 2019-06-09: 2 mg via INTRAVENOUS
  Filled 2019-06-09: qty 2

## 2019-06-09 MED ORDER — ROPIVACAINE HCL 5 MG/ML IJ SOLN
INTRAMUSCULAR | Status: DC | PRN
  Administered 2019-06-09: 40 mL via PERINEURAL

## 2019-06-09 MED ORDER — LACTATED RINGERS IV SOLN
INTRAVENOUS | Status: DC
  Filled 2019-06-09 (×2): qty 1000

## 2019-06-09 MED ORDER — FENTANYL CITRATE (PF) 100 MCG/2ML IJ SOLN
100.0000 ug | Freq: Once | INTRAMUSCULAR | Status: AC
  Administered 2019-06-09: 50 ug via INTRAVENOUS
  Filled 2019-06-09: qty 2

## 2019-06-09 MED ORDER — DEXAMETHASONE SODIUM PHOSPHATE 10 MG/ML IJ SOLN
INTRAMUSCULAR | Status: AC
  Filled 2019-06-09: qty 1

## 2019-06-09 MED FILL — ?BACLOFEN 10 MG TABLET: 10 | 6 days supply | Qty: 20 | Fill #0

## 2019-06-09 SURGICAL SUPPLY — 72 items
BANDAGE ESMARK 6X9 LF (GAUZE/BANDAGES/DRESSINGS) ×1 IMPLANT
BIT DRILL 3.5X122MM AO FIT (BIT) ×2 IMPLANT
BLADE SURG 15 STRL LF DISP TIS (BLADE) ×2 IMPLANT
BLADE SURG 15 STRL SS (BLADE) ×2
BNDG COHESIVE 4X5 TAN STRL (GAUZE/BANDAGES/DRESSINGS) ×2 IMPLANT
BNDG ELASTIC 4X5.8 VLCR STR LF (GAUZE/BANDAGES/DRESSINGS) ×2 IMPLANT
BNDG ELASTIC 6X5.8 VLCR STR LF (GAUZE/BANDAGES/DRESSINGS) ×2 IMPLANT
BNDG ESMARK 6X9 LF (GAUZE/BANDAGES/DRESSINGS) ×2
BNDG GAUZE ELAST 4 BULKY (GAUZE/BANDAGES/DRESSINGS) ×2 IMPLANT
CHLORAPREP W/TINT 26 (MISCELLANEOUS) ×2 IMPLANT
COUNTERSINK (MISCELLANEOUS) ×2
COVER BACK TABLE 60X90IN (DRAPES) ×2 IMPLANT
COVER MAYO STAND STRL (DRAPES) ×2 IMPLANT
COVER WAND RF STERILE (DRAPES) ×2 IMPLANT
CUFF TOURN SGL QUICK 24 (TOURNIQUET CUFF)
CUFF TOURN SGL QUICK 34 (TOURNIQUET CUFF) ×1
CUFF TRNQT CYL 24X4X16.5-23 (TOURNIQUET CUFF) IMPLANT
CUFF TRNQT CYL 34X4.125X (TOURNIQUET CUFF) ×1 IMPLANT
DECANTER SPIKE VIAL GLASS SM (MISCELLANEOUS) IMPLANT
DRAPE EXTREMITY T 121X128X90 (DISPOSABLE) ×2 IMPLANT
DRAPE IMP U-DRAPE 54X76 (DRAPES) ×2 IMPLANT
DRAPE OEC MINIVIEW 54X84 (DRAPES) ×2 IMPLANT
DRAPE U-SHAPE 47X51 STRL (DRAPES) ×2 IMPLANT
DRILL 2.6X122MM WL AO SHAFT (BIT) ×2 IMPLANT
DRSG EMULSION OIL 3X3 NADH (GAUZE/BANDAGES/DRESSINGS) ×2 IMPLANT
DRSG PAD ABDOMINAL 8X10 ST (GAUZE/BANDAGES/DRESSINGS) ×2 IMPLANT
ELECT REM PT RETURN 9FT ADLT (ELECTROSURGICAL) ×2
ELECTRODE REM PT RTRN 9FT ADLT (ELECTROSURGICAL) ×1 IMPLANT
GAUZE SPONGE 4X4 12PLY STRL (GAUZE/BANDAGES/DRESSINGS) ×2 IMPLANT
GLOVE BIO SURGEON STRL SZ7.5 (GLOVE) ×4 IMPLANT
GLOVE BIOGEL PI IND STRL 8 (GLOVE) ×2 IMPLANT
GLOVE BIOGEL PI INDICATOR 8 (GLOVE) ×2
GOWN STRL REUS W/ TWL LRG LVL3 (GOWN DISPOSABLE) ×2 IMPLANT
GOWN STRL REUS W/ TWL XL LVL3 (GOWN DISPOSABLE) ×1 IMPLANT
GOWN STRL REUS W/TWL LRG LVL3 (GOWN DISPOSABLE) ×2
GOWN STRL REUS W/TWL XL LVL3 (GOWN DISPOSABLE) ×1
NEEDLE HYPO 22GX1.5 SAFETY (NEEDLE) IMPLANT
NS IRRIG 1000ML POUR BTL (IV SOLUTION) ×2 IMPLANT
PACK BASIN DAY SURGERY FS (CUSTOM PROCEDURE TRAY) ×2 IMPLANT
PAD CAST 4YDX4 CTTN HI CHSV (CAST SUPPLIES) ×2 IMPLANT
PADDING CAST ABS 4INX4YD NS (CAST SUPPLIES) ×2
PADDING CAST ABS COTTON 4X4 ST (CAST SUPPLIES) ×2 IMPLANT
PADDING CAST COTTON 4X4 STRL (CAST SUPPLIES) ×2
PADDING CAST COTTON 6X4 STRL (CAST SUPPLIES) ×4 IMPLANT
PENCIL BUTTON HOLSTER BLD 10FT (ELECTRODE) ×2 IMPLANT
PLATE TUBUAL 1/3 6H (Plate) ×2 IMPLANT
SCREW CANC 2.5XFT HEX12X4X (Screw) ×1 IMPLANT
SCREW CANCELLOUS 4.0X12MM (Screw) ×1 IMPLANT
SCREW CANCELLOUS 4.0X14 (Screw) ×2 IMPLANT
SCREW CORTEX ST MATTA 3.5X12MM (Screw) ×4 IMPLANT
SCREW CORTEX ST MATTA 3.5X14 (Screw) ×2 IMPLANT
SCREW CORTEX ST MATTA 3.5X26MM (Screw) ×2 IMPLANT
SCREW COUNTERSINK (MISCELLANEOUS) ×1 IMPLANT
SPLINT FAST PLASTER 5X30 (CAST SUPPLIES) ×20
SPLINT PLASTER CAST FAST 5X30 (CAST SUPPLIES) ×20 IMPLANT
SPONGE LAP 4X18 RFD (DISPOSABLE) ×2 IMPLANT
SUCTION FRAZIER HANDLE 10FR (MISCELLANEOUS) ×1
SUCTION TUBE FRAZIER 10FR DISP (MISCELLANEOUS) ×1 IMPLANT
SUT ETHILON 3 0 PS 1 (SUTURE) ×4 IMPLANT
SUT MNCRL AB 4-0 PS2 18 (SUTURE) IMPLANT
SUT MON AB 2-0 CT1 36 (SUTURE) IMPLANT
SUT MON AB 3-0 SH 27 (SUTURE)
SUT MON AB 3-0 SH27 (SUTURE) IMPLANT
SUT VIC AB 0 CT1 36 (SUTURE) ×2 IMPLANT
SUT VIC AB 2-0 CT1 (SUTURE) ×2 IMPLANT
SUT VIC AB 2-0 SH 27 (SUTURE)
SUT VIC AB 2-0 SH 27XBRD (SUTURE) IMPLANT
SYR BULB 3OZ (MISCELLANEOUS) ×2 IMPLANT
SYR CONTROL 10ML LL (SYRINGE) IMPLANT
TOWEL OR 17X26 10 PK STRL BLUE (TOWEL DISPOSABLE) ×4 IMPLANT
TUBE CONNECTING 12X1/4 (SUCTIONS) ×2 IMPLANT
countersink ×2 IMPLANT

## 2019-06-09 NOTE — Progress Notes (Signed)
Assisted Dr. Finucane with right, ultrasound guided, popliteal, adductor canal block. Side rails up, monitors on throughout procedure. See vital signs in flow sheet. Tolerated Procedure well. 

## 2019-06-09 NOTE — Anesthesia Preprocedure Evaluation (Addendum)
Anesthesia Evaluation  Patient identified by MRN, date of birth, ID band Patient awake    Reviewed: Allergy & Precautions, NPO status , Patient's Chart, lab work & pertinent test results  Airway Mallampati: II  TM Distance: >3 FB Neck ROM: Full    Dental no notable dental hx.    Pulmonary neg pulmonary ROS, former smoker,    Pulmonary exam normal breath sounds clear to auscultation       Cardiovascular hypertension, Pt. on medications Normal cardiovascular exam Rhythm:Regular Rate:Normal  Echo 05/03/2018: Normal LV size with EF 55-60%. Normal RV size and systolicfunction. No signifcant valvular abnormalities. If there isconcern for endocarditis, valves were not seen well enough todefinitively exclude vegetation. Mild pulmonary hypertension.    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   Anesthesia Other Findings   Reproductive/Obstetrics negative OB ROS                            Anesthesia Physical Anesthesia Plan  ASA: IV  Anesthesia Plan: Regional and MAC   Post-op Pain Management:  Regional for Post-op pain   Induction:   PONV Risk Score and Plan: 1 and Ondansetron  Airway Management Planned: Simple Face Mask and Natural Airway  Additional Equipment: None  Intra-op Plan:   Post-operative Plan: Extubation in OR  Informed Consent: I have reviewed the patients History and Physical, chart, labs and discussed the procedure including  the risks, benefits and alternatives for the proposed anesthesia with the patient or authorized representative who has indicated his/her understanding and acceptance.     Dental advisory given  Plan Discussed with: CRNA  Anesthesia Plan Comments: (Full labs and type and screen preop No tylenol, no NSAIDS)       Anesthesia Quick Evaluation

## 2019-06-09 NOTE — Interval H&P Note (Signed)
I participated in the care of this patient and agree with the above history, physical and evaluation. I performed a review of the history and a physical exam as detailed   Ginna Schuur Daniel Derek Huneycutt MD  

## 2019-06-09 NOTE — Discharge Instructions (Signed)
It is very important for you to Elevate your leg - Toes above nose as much as possible to reduce pain / swelling. If needed, you may increase pain medication for the first few days post op to 2 tablets every 4 hours.  Weight Bearing:  Non weight bearing affected leg.  Diet: As you were doing prior to hospitalization   Shower:  You have a splint on, leave the splint in place and keep the splint dry with a plastic bag.  Dressing:  You have a splint. Leave the splint in place and we will change your bandages during your first follow-up appointment.  You may loosen and re-apply ace wrap if it feels too tight.    Activity:  Increase activity slowly as tolerated, but follow the weight bearing instructions below.  The rules on driving is that you can not be taking narcotics while you drive, and you must feel in control of the vehicle.    To prevent constipation:  Narcotic medicines cause constipation.  Wean these as soon as is appropriate.   You may use a stool softener such as -  Colace (over the counter) 100 mg by mouth twice a day  Drink plenty of fluids (prune juice may be helpful) and high fiber foods Miralax (over the counter) for constipation as needed.    Itching:  If you experience itching with your medications, try taking only a single pain pill, or even half a pain pill at a time.  You can also use benadryl over the counter for itching or also to help with sleep.   Precautions:  If you experience chest pain or shortness of breath - call 911 immediately for transfer to the hospital emergency department!!  If you develop a fever greater that 101 F, purulent drainage from wound, increased redness or drainage from wound, or calf pain -- Call the office at [phone]                                                 Follow- Up Appointment:  Please call for an appointment to be seen in 1-2 weeks [place] - [phone]   Post Anesthesia Home Care Instructions  Activity: Get plenty  of rest for the remainder of the day. A responsible individual must stay with you for 24 hours following the procedure.  For the next 24 hours, DO NOT: -Drive a car -Advertising copywriter -Drink alcoholic beverages -Take any medication unless instructed by your physician -Make any legal decisions or sign important papers.  Meals: Start with liquid foods such as gelatin or soup. Progress to regular foods as tolerated. Avoid greasy, spicy, heavy foods. If nausea and/or vomiting occur, drink only clear liquids until the nausea and/or vomiting subsides. Call your physician if vomiting continues.  Special Instructions/Symptoms: Your throat may feel dry or sore from the anesthesia or the breathing tube placed in your throat during surgery. If this causes discomfort, gargle with warm salt water. The discomfort should disappear within 24 hours.  If you had a scopolamine patch placed behind your ear for the management of post- operative nausea and/or vomiting:  1. The medication in the patch is effective for 72 hours, after which it should be removed.  Wrap patch in a tissue and discard in the trash. Wash hands thoroughly with soap and water. 2. You may remove the patch  earlier than 72 hours if you experience unpleasant side effects which may include dry mouth, dizziness or visual disturbances. 3. Avoid touching the patch. Wash your hands with soap and water after contact with the patch.    Information for Discharge Teaching: EXPAREL (bupivacaine liposome injectable suspension)   Your surgeon or anesthesiologist gave you EXPAREL(bupivacaine) to help control your pain after surgery.   EXPAREL is a local anesthetic that provides pain relief by numbing the tissue around the surgical site.  EXPAREL is designed to release pain medication over time and can control pain for up to 72 hours.  Depending on how you respond to EXPAREL, you may require less pain medication during your recovery.  Possible side  effects:  Temporary loss of sensation or ability to move in the area where bupivacaine was injected.  Nausea, vomiting, constipation  Rarely, numbness and tingling in your mouth or lips, lightheadedness, or anxiety may occur.  Call your doctor right away if you think you may be experiencing any of these sensations, or if you have other questions regarding possible side effects.  Follow all other discharge instructions given to you by your surgeon or nurse. Eat a healthy diet and drink plenty of water or other fluids.  If you return to the hospital for any reason within 96 hours following the administration of EXPAREL, it is important for health care providers to know that you have received this anesthetic. A teal colored band has been placed on your arm with the date, time and amount of EXPAREL you have received in order to alert and inform your health care providers. Please leave this armband in place for the full 96 hours following administration, and then you may remove the band.  Regional Anesthesia Blocks  1. Numbness or the inability to move the "blocked" extremity may last from 3-48 hours after placement. The length of time depends on the medication injected and your individual response to the medication. If the numbness is not going away after 48 hours, call your surgeon.  2. The extremity that is blocked will need to be protected until the numbness is gone and the  Strength has returned. Because you cannot feel it, you will need to take extra care to avoid injury. Because it may be weak, you may have difficulty moving it or using it. You may not know what position it is in without looking at it while the block is in effect.  3. For blocks in the legs and feet, returning to weight bearing and walking needs to be done carefully. You will need to wait until the numbness is entirely gone and the strength has returned. You should be able to move your leg and foot normally before you try and  bear weight or walk. You will need someone to be with you when you first try to ensure you do not fall and possibly risk injury.  4. Bruising and tenderness at the needle site are common side effects and will resolve in a few days.  5. Persistent numbness or new problems with movement should be communicated to the surgeon or the [facility] [phone] [facility] [phone]).

## 2019-06-09 NOTE — Anesthesia Procedure Notes (Signed)
Anesthesia Regional Block: Popliteal block   Pre-Anesthetic Checklist: ,, timeout performed, Correct Patient, Correct Site, Correct Laterality, Correct Procedure, Correct Position, site marked, Risks and benefits discussed,  Surgical consent,  Pre-op evaluation,  At surgeon's request and post-op pain management  Laterality: Right  Prep: Maximum Sterile Barrier Precautions used, chloraprep       Needles:  Injection technique: Single-shot  Needle Type: Echogenic Stimulator Needle     Needle Length: 9cm  Needle Gauge: 22     Additional Needles:   Procedures:,,,, ultrasound used (permanent image in chart),,,,  Narrative:  Start time: 06/09/2019 10:15 AM End time: 06/09/2019 10:20 AM Injection made incrementally with aspirations every 5 mL.  Performed by: Personally  Anesthesiologist: Lannie Fields, DO  Additional Notes: Monitors applied. No increased pain on injection. No increased resistance to injection. Injection made in 5cc increments. Good needle visualization. Patient tolerated procedure well.

## 2019-06-09 NOTE — Transfer of Care (Signed)
Immediate Anesthesia Transfer of Care Note  Patient: Jason [name]  Procedure(s) Performed: Procedure(s) (LRB): RIGHT OPEN REDUCTION INTERNAL FIXATION (ORIF) ANKLE FRACTURE (Right)  Patient Location: PACU  Anesthesia Type: General  Level of Consciousness: awake, sedated, patient cooperative and responds to stimulation  Airway & Oxygen Therapy: Patient Spontanous Breathing and Patient connected to RA and soft FM   Post-op Assessment: Report given to PACU RN, Post -op Vital signs reviewed and stable and Patient moving all extremities  Post vital signs: Reviewed and stable  Complications: No apparent anesthesia complications

## 2019-06-09 NOTE — Anesthesia Postprocedure Evaluation (Signed)
Anesthesia Post Note  Patient: Jason [name]  Procedure(s) Performed: RIGHT OPEN REDUCTION INTERNAL FIXATION (ORIF) ANKLE FRACTURE (Right Ankle)     Patient location during evaluation: PACU Anesthesia Type: Regional and MAC Level of consciousness: awake and alert Pain management: pain level controlled Vital Signs Assessment: post-procedure vital signs reviewed and stable Respiratory status: spontaneous breathing, nonlabored ventilation and respiratory function stable Cardiovascular status: blood pressure returned to baseline and stable Postop Assessment: no apparent nausea or vomiting Anesthetic complications: no    Last Vitals:  Vitals:   06/09/19 1239 06/09/19 1245  BP: 123/78 119/66  Pulse: 76 75  Resp: 16 13  Temp: 36.6 C   SpO2: 99% 97%    Last Pain:  Vitals:   06/09/19 1239  TempSrc:   PainSc: 0-No pain                 Pervis Hocking

## 2019-06-09 NOTE — Anesthesia Procedure Notes (Signed)
Procedure Name: MAC Date/Time: 06/09/2019 11:32 AM Performed by: Justice Rocher, CRNA Pre-anesthesia Checklist: Patient identified, Emergency Drugs available, Suction available, Patient being monitored and Timeout performed Patient Re-evaluated:Patient Re-evaluated prior to induction Oxygen Delivery Method: Simple face mask Preoxygenation: Pre-oxygenation with 100% oxygen Induction Type: IV induction Ventilation: Oral airway inserted - appropriate to patient size Placement Confirmation: CO2 detector,  positive ETCO2 and breath sounds checked- equal and bilateral

## 2019-06-09 NOTE — Anesthesia Procedure Notes (Signed)
Anesthesia Regional Block: Adductor canal block   Pre-Anesthetic Checklist: ,, timeout performed, Correct Patient, Correct Site, Correct Laterality, Correct Procedure, Correct Position, site marked, Risks and benefits discussed,  Surgical consent,  Pre-op evaluation,  At surgeon's request and post-op pain management  Laterality: Right  Prep: Maximum Sterile Barrier Precautions used, chloraprep       Needles:  Injection technique: Single-shot  Needle Type: Echogenic Stimulator Needle     Needle Length: 9cm  Needle Gauge: 22     Additional Needles:   Procedures:,,,, ultrasound used (permanent image in chart),,,,  Narrative:  Start time: 06/09/2019 10:10 AM End time: 06/09/2019 10:15 AM Injection made incrementally with aspirations every 5 mL.  Performed by: Personally  Anesthesiologist: Lannie Fields, DO  Additional Notes: Monitors applied. No increased pain on injection. No increased resistance to injection. Injection made in 5cc increments. Good needle visualization. Patient tolerated procedure well.

## 2019-06-10 NOTE — Op Note (Signed)
06/09/2019  7:44 AM  PATIENT:  Jason [name]    PRE-OPERATIVE DIAGNOSIS:  RIGHT ANKLE FRACTURE  POST-OPERATIVE DIAGNOSIS:  Same  PROCEDURE:  RIGHT OPEN REDUCTION INTERNAL FIXATION (ORIF) ANKLE FRACTURE  SURGEON:  Jason Apley, MD  ASSISTANT: Aquilla Hacker, PA-C, he was present and scrubbed throughout the case, critical for completion in a timely fashion, and for retraction, instrumentation, and closure.   ANESTHESIA:   Block and sedation   PREOPERATIVE INDICATIONS:  Jason [name] is a  39 y.o. male with a diagnosis of RIGHT ANKLE FRACTURE who failed conservative measures and elected for surgical management.    The risks benefits and alternatives were discussed with the patient preoperatively including but not limited to the risks of infection, bleeding, nerve injury, cardiopulmonary complications, the need for revision surgery, among others, and the patient was willing to proceed.  OPERATIVE IMPLANTS: stryker ankle plate  OPERATIVE FINDINGS: Unstable ankle fracture. Stable syndesmosis post op  BLOOD LOSS: min  COMPLICATIONS: none  TOURNIQUET TIME: 15-25min  OPERATIVE PROCEDURE:  Patient was identified in the preoperative holding area and site was marked by me He was transported to the operating theater and placed on the table in supine position taking care to pad all bony prominences. After a preincinduction time out anesthesia was induced. The right lower extremity was prepped and draped in normal sterile fashion and a pre-incision timeout was performed. Jason [name] received ancef for preoperative antibiotics.   I made a lateral incision of roughly 7 cm dissection was carried down sharply to the distal fibula and then spreading dissection was used proximally to protect the superficial peroneal nerve. I sharply incised the periosteum and took care to protect the peroneal tendons. I then debrided the fracture site and  performed a reduction maneuver which was held in place with a clamp.   I placed a lag screw across the fracture  I then selected a 6-hole one third tubular plate and placed in a neutralization fashion care was taken distally so as not to penetrate the joint with the cancellus screws.  I then stressed the syndesmosis and it was stable   The wound was then thoroughly irrigated and closed using a 0 Vicryl and absorbable Monocryl sutures. He was placed in a short leg splint.   POST OPERATIVE PLAN: Non-weightbearing. DVT prophylaxis will consist of mobilization and chemical px

## 2019-06-11 ENCOUNTER — Encounter: Payer: Self-pay | Admitting: Oncology

## 2019-06-16 ENCOUNTER — Other Ambulatory Visit: Payer: Self-pay

## 2019-06-16 ENCOUNTER — Ambulatory Visit: Payer: Self-pay | Attending: Internal Medicine

## 2019-06-16 ENCOUNTER — Other Ambulatory Visit: Payer: Self-pay | Admitting: Physician Assistant

## 2019-06-16 DIAGNOSIS — K7031 Alcoholic cirrhosis of liver with ascites: Secondary | ICD-10-CM

## 2019-06-16 MED FILL — FOLIC ACID 1 MG TABS: 1 | 30 days supply | Qty: 30 | Fill #2

## 2019-06-16 MED FILL — ?PANTOPRAZOLE SO DR 40MG TA: 40 | 30 days supply | Qty: 30 | Fill #3

## 2019-06-16 MED FILL — ?SPIRONOLACTONE 100MG TAB: 100 | 30 days supply | Qty: 30 | Fill #3

## 2019-06-16 MED FILL — POTASSIUM CL ER 20 MEQ TAB: 20 | 30 days supply | Qty: 60 | Fill #3

## 2019-06-16 MED FILL — ?FUROSEMIDE 40 MG TABLET: 40 | 30 days supply | Qty: 60 | Fill #3

## 2019-06-17 MED FILL — LACTULOSE 10 GM/15 ML SOLN: 10 | 15 days supply | Qty: 473 | Fill #0

## 2019-06-25 ENCOUNTER — Other Ambulatory Visit: Payer: Self-pay

## 2019-06-25 ENCOUNTER — Encounter: Payer: Self-pay | Admitting: Internal Medicine

## 2019-06-25 ENCOUNTER — Ambulatory Visit: Payer: Self-pay | Attending: Internal Medicine | Admitting: Internal Medicine

## 2019-06-25 VITALS — BP 131/78 | HR 100 | Temp 98.2°F | Resp 16 | Wt 225.4 lb

## 2019-06-25 DIAGNOSIS — S82892A Other fracture of left lower leg, initial encounter for closed fracture: Secondary | ICD-10-CM

## 2019-06-25 DIAGNOSIS — K7030 Alcoholic cirrhosis of liver without ascites: Secondary | ICD-10-CM

## 2019-06-25 DIAGNOSIS — F1021 Alcohol dependence, in remission: Secondary | ICD-10-CM

## 2019-06-25 DIAGNOSIS — S82892S Other fracture of left lower leg, sequela: Secondary | ICD-10-CM

## 2019-06-25 DIAGNOSIS — D696 Thrombocytopenia, unspecified: Secondary | ICD-10-CM

## 2019-06-25 NOTE — Progress Notes (Signed)
Patient ID: Jason [name], male    DOB: 1981-01-29  MRN: [id number]  CC: Hospital follow-up  Subjective: Jason [name] is a 39 y.o. male who presents for hosp f/u His concerns today include:  Patient with history of severeETOH usedisorder, alcohol induced cirrhosis, SBP  Pt fell while going up some stairs 06/02/2019.  Sustained RT ankle fx.  Underwent ORIF 06/09/2019 by Dr.Murphy.  Saw surgeon 8 days ago and leg placed in boot.  Told to f/u in 4 wks.  Told to call for earlier appt if he has any drainage.  No drainage so far.    ETOH use disorder/cirrhosis:  Clean for past 16 mths -no swelling in LLE No increase abdominal girth to suggest ascites Last saw Dr. Bryan Lemma in August of last year.  He had an MRI of the liver done 11/2018.  He reports compliance with furosemide and spironolactone. Patient Active Problem List   Diagnosis Date Noted  . Former smoker 06/10/2018  . Ascites   . Decompensated hepatic cirrhosis ([place]) 05/01/2018  . SBP (spontaneous bacterial peritonitis) ([place]) 05/01/2018  . Macrocytic anemia 05/01/2018  . Thrombocytopenia ([place]) 05/01/2018  . Hypokalemia 05/01/2018  . Liver lesion, right lobe 05/01/2018  . Liver failure ([place]) 05/01/2018  . Liver failure without hepatic coma ([place])   . Alcohol abuse   . Hematemesis 03/04/2018  . Thrombocytopenia ([place]) 03/04/2018  . Alcoholic hepatitis with ascites   . Alcoholic cirrhosis of liver with ascites ([place]) 06/29/2017  . BMI 40.0-44.9, adult ([place]) 06/29/2017     Current Outpatient Medications on File Prior to Visit  Medication Sig Dispense Refill  . acidophilus (RISAQUAD) CAPS capsule Take 1 capsule by mouth daily. 30 capsule 3  . baclofen (LIORESAL) 10 MG tablet Take 1 tablet (10 mg total) by mouth 3 (three) times daily as needed for muscle spasms. 20 each 0  . folic acid (FOLVITE) 1 MG tablet TAKE 1 TABLET (1 MG TOTAL) BY MOUTH DAILY. 30 tablet 2  . furosemide (LASIX) 40 MG tablet Take 1  tablet (40 mg total) by mouth 2 (two) times daily. 60 tablet 5  . lactulose, encephalopathy, (CHRONULAC) 10 GM/15ML SOLN TAKE 30 ML BY MOUTH TWICE DAILY 473 mL 1  . Multiple Vitamin (MULTIVITAMIN WITH MINERALS) TABS tablet Take 1 tablet by mouth daily. 30 tablet 2  . ondansetron (ZOFRAN) 4 MG tablet Take 1 tablet (4 mg total) by mouth every 6 (six) hours as needed for nausea or vomiting. 30 tablet 1  . pantoprazole (PROTONIX) 40 MG tablet Take 1 tablet (40 mg total) by mouth daily before lunch. 30 tablet 5  . potassium chloride SA (KLOR-CON) 20 MEQ tablet Take 1 tablet (20 mEq total) by mouth 2 (two) times daily. 60 tablet 5  . spironolactone (ALDACTONE) 100 MG tablet Take 1 tablet (100 mg total) by mouth daily. 30 tablet 5  . thiamine 100 MG tablet Take 1 tablet (100 mg total) by mouth daily. 30 tablet 5   No current facility-administered medications on file prior to visit.    No Known Allergies  Social History   Socioeconomic History  . Marital status: Legally Separated    Spouse name: Not on file  . Number of children: Not on file  . Years of education: Not on file  . Highest education level: Not on file  Occupational History  . Occupation: Curator Engineer, materials)  Tobacco Use  . Smoking status: Former Research scientist (life sciences)  . Smokeless tobacco: Never Used  . Tobacco comment:  said he has tried it  Substance and Sexual Activity  . Alcohol use: Not Currently    Comment: 03/05/2018  . Drug use: Never  . Sexual activity: Yes  Other Topics Concern  . Not on file  Social History Narrative   ** Merged History Encounter **       Lives in [place] with wife and daughter.    Social Determinants of Health   Financial Resource Strain:   . Difficulty of Paying Living Expenses: Not on file  Food Insecurity:   . Worried About Programme researcher, broadcasting/film/video in the Last Year: Not on file  . Ran Out of Food in the Last Year: Not on file  Transportation Needs:   . Lack of Transportation (Medical): Not on file    . Lack of Transportation (Non-Medical): Not on file  Physical Activity:   . Days of Exercise per Week: Not on file  . Minutes of Exercise per Session: Not on file  Stress:   . Feeling of Stress : Not on file  Social Connections:   . Frequency of Communication with Friends and Family: Not on file  . Frequency of Social Gatherings with Friends and Family: Not on file  . Attends Religious Services: Not on file  . Active Member of Clubs or Organizations: Not on file  . Attends Banker Meetings: Not on file  . Marital Status: Not on file  Intimate Partner Violence:   . Fear of Current or Ex-Partner: Not on file  . Emotionally Abused: Not on file  . Physically Abused: Not on file  . Sexually Abused: Not on file    Family History  Problem Relation Age of Onset  . Colon cancer Neg Hx   . Esophageal cancer Neg Hx   . Healthy Mother   . Healthy Daughter     Past Surgical History:  Procedure Laterality Date  . FOOT SURGERY Left    around 2014. a scar on lower leg  . ORIF ANKLE FRACTURE Right 06/09/2019   Procedure: RIGHT OPEN REDUCTION INTERNAL FIXATION (ORIF) ANKLE FRACTURE;  Surgeon: Sheral Apley, MD;  Location: [facility];  Service: Orthopedics;  Laterality: Right;    ROS: Review of Systems Negative except as stated above  PHYSICAL EXAM: BP 131/78   Pulse 100   Temp 98.2 F (36.8 C)   Resp 16   Wt 225 lb 6.4 oz (102.2 kg)   SpO2 98%   BMI 37.51 kg/m   Physical Exam  General appearance - alert, well appearing, and in no distress Mental status - normal mood, behavior, speech, dress, motor activity, and thought processes Abdomen - soft, nontender, nondistended, no masses or organomegaly Extremities -right lower extremity is in a surgical boot.  I did not remove it.  No edema of the left lower extremity. CMP Latest Ref Rng & Units 06/09/2019 05/07/2019 03/06/2019  Glucose 70 - 99 mg/dL 009(F) 90 818(E)  BUN 6 - 20 mg/dL 12 12 11   Creatinine  0.61 - 1.24 mg/dL ) 9.93(Z) 1.69(C)  Sodium 135 - 145 mmol/L 141 139 136  Potassium 3.5 - 5.1 mmol/L 3.9 3.8 4.1  Chloride 98 - 111 mmol/L 108 109(H) 104  CO2 20 - 29 mmol/L - 21 19(L)  Calcium 8.7 - 10.2 mg/dL - 8.8 7.89(F)  Total Protein 6.0 - 8.5 g/dL - 6.3 6.1  Total Bilirubin 0.0 - 1.2 mg/dL - 2.8(H) 3.8(H)  Alkaline Phos 39 - 117 IU/L - 266(H) 278(H)  AST  0 - 40 IU/L - 73(H) 98(H)  ALT 0 - 44 IU/L - 48(H) 55(H)   Lipid Panel  No results found for: CHOL, TRIG, HDL, CHOLHDL, VLDL, LDLCALC, LDLDIRECT  CBC    Component Value Date/Time   WBC 5.0 06/09/2019 0910   RBC 3.91 (L) 06/09/2019 0910   HGB 12.6 (L) 06/09/2019 1000   HGB 13.6 05/07/2019 1413   HCT 37.0 (L) 06/09/2019 1000   HCT 37.6 05/07/2019 1413   PLT 31 (L) 06/09/2019 0910   PLT 33 (LL) 05/07/2019 1413   MCV 100.3 (H) 06/09/2019 0910   MCV 95 05/07/2019 1413   MCH 33.8 06/09/2019 0910   MCHC 33.7 06/09/2019 0910   RDW 15.6 (H) 06/09/2019 0910   RDW 15.4 05/07/2019 1413   LYMPHSABS 1.6 05/07/2019 1413   MONOABS 0.8 05/19/2018 1020   EOSABS 0.1 05/07/2019 1413   BASOSABS 0.0 05/07/2019 1413    ASSESSMENT AND PLAN: 1. Ankle fracture, left, sequela He has had surgery and is followed by orthopedic surgeon Dr. Eulah Pont.  2. Alcoholic cirrhosis of liver without ascites (HCC) 3. Alcohol use disorder, severe, in sustained remission (HCC) Commended him on remaining free of alcohol. Still has significant cirrhosis is evident with the low platelet count and abnormal LFTs.  Plans to follow-up with GI  4. Thrombocytopenia (HCC) See #3 above   Patient was given the opportunity to ask questions.  Patient verbalized understanding of the plan and was able to repeat key elements of the plan.  [employer] interpreter used during this encounter. #[id number]   No orders of the defined types were placed in this encounter.    Requested Prescriptions    No prescriptions requested or ordered in this encounter    Return in  about 3 months (around 09/22/2019).  Jonah Blue, MD, FACP

## 2019-07-03 ENCOUNTER — Ambulatory Visit: Payer: Self-pay | Admitting: Gastroenterology

## 2019-07-07 ENCOUNTER — Ambulatory Visit: Payer: Self-pay | Admitting: Internal Medicine

## 2019-07-13 ENCOUNTER — Other Ambulatory Visit: Payer: Self-pay

## 2019-07-13 ENCOUNTER — Ambulatory Visit: Payer: Self-pay | Admitting: Gastroenterology

## 2019-07-13 ENCOUNTER — Ambulatory Visit (INDEPENDENT_AMBULATORY_CARE_PROVIDER_SITE_OTHER): Payer: Self-pay | Admitting: Gastroenterology

## 2019-07-13 ENCOUNTER — Encounter: Payer: Self-pay | Admitting: Gastroenterology

## 2019-07-13 VITALS — BP 128/76 | HR 90 | Temp 97.8°F | Ht 65.0 in | Wt 220.0 lb

## 2019-07-13 DIAGNOSIS — K7031 Alcoholic cirrhosis of liver with ascites: Secondary | ICD-10-CM

## 2019-07-13 DIAGNOSIS — K7030 Alcoholic cirrhosis of liver without ascites: Secondary | ICD-10-CM

## 2019-07-13 DIAGNOSIS — Z23 Encounter for immunization: Secondary | ICD-10-CM

## 2019-07-13 DIAGNOSIS — K7682 Hepatic encephalopathy: Secondary | ICD-10-CM

## 2019-07-13 DIAGNOSIS — K766 Portal hypertension: Secondary | ICD-10-CM

## 2019-07-13 DIAGNOSIS — K7290 Hepatic failure, unspecified without coma: Secondary | ICD-10-CM

## 2019-07-13 MED ORDER — FOLIC ACID 1 MG PO TABS: 1.0000 mg | ORAL_TABLET | Freq: Every day | ORAL | 2 refills | Status: DC

## 2019-07-13 MED ORDER — LACTULOSE ENCEPHALOPATHY 10 GM/15ML PO SOLN: ORAL | 1 refills | Status: DC

## 2019-07-13 MED FILL — FOLIC ACID 1 MG TABS: 1 | 30 days supply | Qty: 30 | Fill #0

## 2019-07-13 MED FILL — LACTULOSE 10 GM/15 ML SOLN: 10 | 15 days supply | Qty: 473 | Fill #0

## 2019-07-13 NOTE — Progress Notes (Signed)
P  Chief Complaint:    Cirrhosis follow-up  GI History: 39 year old male with decompensated alcoholic cirrhosis complicated by ascites, SBP, hepatic encephalopathy, coagulopathy, alcoholic hepatitis, varices noted on cross-sectional imaging (no EGD to date). Was admitted in 04/2018 with sepsis/bacteremia (Rothia mucilaginosa), superimposed alcoholic hepatitis, e.g., SBP. Completed 14-day course of IV antibiotics and transition to ciprofloxacin for SBP prophylaxis (paracentesis negative for SBP, but prophylaxis recommended by ID given bacteremia with enteric organism).  Cirrhosis Evaluation: -Etiology: Alcohol -Complications: Hepatic encephalopathy, ascites, SBP, coagulopathy, varices (on CT and MRI, but no EGD done yet) -HCC screening:  -11/2018: AFP normal (3.1) -04/30/2018, RX:[id number] appearing liver, 2 scattered subcentimeter hypodensities in the right liver lobe -08/04/2018, MRI three-phase: Several small hepatic cysts, 1.1 x 0.9 cm indeterminate focus of arterial enhancement in segment 2 without capsule or washout appearance (LI-RADS LR 3)         -12/08/2018, MRI three-phase: LR 3 lesion no longer seen, mild arterial phase enhancement in the dome of the right hepatic lobe favoring transient hepatic attenuation without underlying lesion.  Prominent GB wall thickening (likely 2/2 hypoalbuminemia).  Trace ascites, cirrhosis with portal hypertension, right gastric varices, small uphill paraesophageal varices, splenomegaly -Variceal screening: To schedule EGD today -Serologic evaluation: Completed 04/2018. No concomitant injury/disease -Viral hepatitis vaccination: Started hepatitis B series. Immunity to HAV -Flu vaccine: 2020 -Pneumonia vaccine: 2020 -Liver biopsy: None -Cirrhosis medications:Lasix 40 mg, Aldactone 100 mg, ciprofloxacin 250 mg weekly, lactulose, potassium -MELD:25 --> 14 -Child Pugh score: C (10 pts)  HPI:     Patient is a 39  y.o. male presenting to the [facility]ology [facility] for follow-up.  Was last seen by me on 12/23/2018.  At that time was doing well, abstaining from all alcohol (last drink was prior to hospitalization 04/2018).  Good social support network and no relapse.  At that time, had plan for EGD at [facility] for EV screening, but not yet completed.  Was referred to [facility] given elevated MELD (25 points) despite EtOH abstinence. Was seen last week 07/09/19. Needs to secure insurance before OLT listing- has appt on 3/30 about this.    Since that appointment, right ankle surgery (ORIF) after fall at work.  Otherwise, feels well.  Some pain on his left side, but mostly because he's been sleeping on his left to avoid right-sided ankle boot/fracture.  Otherwise, good p.o. intake, no issues tolerating medications as prescribed.   -06/09/2019: INR 1.4 (improved), H/H 13/39, PLT 31 -05/07/2019: T bili 2.8 (improved), albumin 3.2, AST/ALT 73/48 (improved), ALP 266, protein 6.3, creatinine 0.62, NA 139; H/H 13/37, PLT 33 - MELD: 14 -No imaging since MRI 8/20 as above  History obtained via interpreter   Review of systems:     No chest pain, no SOB, no fevers, no urinary sx   Past Medical History:  Diagnosis Date  . Alcoholic cirrhosis of liver ([place])   . Anemia   . Ankle fracture    Right  . GERD (gastroesophageal reflux disease)   . Hypertension   . Nocturia   . Thrombocytopenia ([place])     Patient's surgical history, family medical history, social history, medications and allergies were all reviewed in Epic    Current Outpatient Medications  Medication Sig Dispense Refill  . acidophilus (RISAQUAD) CAPS capsule Take 1 capsule by mouth daily. 30 capsule 3  . baclofen (LIORESAL) 10 MG tablet Take 1 tablet (10 mg total) by mouth 3 (three) times daily as needed for muscle spasms. 20 each 0  .  folic acid (FOLVITE) 1 MG tablet TAKE 1 TABLET (1 MG TOTAL) BY MOUTH DAILY. 30 tablet 2  . furosemide (LASIX) 40 MG  tablet Take 1 tablet (40 mg total) by mouth 2 (two) times daily. 60 tablet 5  . lactulose, encephalopathy, (CHRONULAC) 10 GM/15ML SOLN TAKE 30 ML BY MOUTH TWICE DAILY 473 mL 1  . Multiple Vitamin (MULTIVITAMIN WITH MINERALS) TABS tablet Take 1 tablet by mouth daily. 30 tablet 2  . ondansetron (ZOFRAN) 4 MG tablet Take 1 tablet (4 mg total) by mouth every 6 (six) hours as needed for nausea or vomiting. 30 tablet 1  . pantoprazole (PROTONIX) 40 MG tablet Take 1 tablet (40 mg total) by mouth daily before lunch. 30 tablet 5  . potassium chloride SA (KLOR-CON) 20 MEQ tablet Take 1 tablet (20 mEq total) by mouth 2 (two) times daily. 60 tablet 5  . spironolactone (ALDACTONE) 100 MG tablet Take 1 tablet (100 mg total) by mouth daily. 30 tablet 5  . thiamine 100 MG tablet Take 1 tablet (100 mg total) by mouth daily. 30 tablet 5   No current facility-administered medications for this visit.    Physical Exam:     BP 128/76   Pulse 90   Temp 97.8 F (36.6 C)   Ht 5\' 5"  (1.651 m)   Wt 220 lb (99.8 kg) Comment: with boot  BMI 36.61 kg/m   GENERAL:  Pleasant male in NAD PSYCH: : Cooperative, normal affect EENT:  conjunctiva pink, mucous membranes moist, neck supple without masses CARDIAC:  RRR, no murmur heard, no peripheral edema PULM: Normal respiratory effort, lungs CTA bilaterally, no wheezing ABDOMEN:  Nondistended, soft, nontender. No obvious masses, no hepatomegaly,  normal bowel sounds SKIN:  turgor, no lesions seen Musculoskeletal: In place on right foot/ankle  NEURO: Alert and oriented x 3, no focal neurologic deficits   IMPRESSION and PLAN:    1)Alcoholic Cirrhosis: Decompensated alcoholic cirrhosis with history of superimposed Alcoholic Hepatitis in 04/2018. Has since quit alcohol in 04/2018 without any recurrence.  Labs with some improvement in hepatic synthetic function.  -Plan for EGD at [facility] for EV screening.Plan to be done at [facility] given radiographic evidence of varices and  elevated MELD (14) and Child Pugh (C) scores -Repeat labs in 6 months.  - Continue follow-up with [facility] in 3 months -Discussed major barrier to OLT his lack of insurance.  He has had appointment at the end of this month to assist in this -Resume Lasix and Aldactone at current dose -Resume lactulose -Resume ciprofloxacin for SBP prophylaxis per prior ID recommendations -Complete hepatitis B vaccine series today -Repeat [facility] for ongoing HCC screening -Resume low-sodium diet -Resume abstinence of all alcohol -Resume vitamin supplements -Folic acid and lactulose refilled  2)Hepatoma screening: 1.1 x 0.9 cm arterial enhancing lesion in segment 2 of the liver without capsule or typical washout.  This was not seen on repeat MRI 11/2018. AFP normal at 3.1.  -RUQ 12/2018 as above  3) Ascites: Well-controlled on current diuretics  4) LE Edema: Well-controlled with diuretics and low-sodium diet. Resume current therapy.  5) Alcohol abuse disorder: Has been alcohol free since hospital discharge in 04/2018.   Again applauded his efforts and continued desire to maintain complete abstinence of alcohol and encouraged him that this is undoubtedly the most important thing he can do for his overall health and improved morbidity/mortality.    6) Portal hypertension: Treatment as above  7) Hepatic encephalopathy: -Well-controlled on lactulose  The indications, risks, and benefits of EGD were explained to the patient in detail. Risks include but are not limited to bleeding, perforation, adverse reaction to medications, and cardiopulmonary compromise. Sequelae include but are not limited to the possibility of surgery, hositalization, and mortality.  Elevated risks due to underlying cirrhosis and possibility for esophageal variceal banding.  The patient verbalized understanding and wished to proceed. All questions answered, referred to scheduler. Further recommendations pending results  of the exam.         Shellia Cleverly ,DO, FACG 07/13/2019, 2:09 PM

## 2019-07-13 NOTE — H&P (View-Only) (Signed)
P  Chief Complaint:    Cirrhosis follow-up  GI History: 39 year old Jason [name] with decompensated alcoholic cirrhosis complicated by ascites, SBP, hepatic encephalopathy, coagulopathy, alcoholic hepatitis, varices noted on cross-sectional imaging (no EGD to date). Was admitted in 04/2018 with sepsis/bacteremia (Rothia mucilaginosa), superimposed alcoholic hepatitis, e.g., SBP. Completed 14-day course of IV antibiotics and transition to ciprofloxacin for SBP prophylaxis (paracentesis negative for SBP, but prophylaxis recommended by ID given bacteremia with enteric organism).  Cirrhosis Evaluation: -Etiology: Alcohol -Complications: Hepatic encephalopathy, ascites, SBP, coagulopathy, varices (on CT and MRI, but no EGD done yet) -HCC screening:  -11/2018: AFP normal (3.1) -04/30/2018, RX:[id number] appearing liver, 2 scattered subcentimeter hypodensities in the right liver lobe -08/04/2018, MRI three-phase: Several small hepatic cysts, 1.1 x 0.9 cm indeterminate focus of arterial enhancement in segment 2 without capsule or washout appearance (LI-RADS LR 3)         -12/08/2018, MRI three-phase: LR 3 lesion no longer seen, mild arterial phase enhancement in the dome of the right hepatic lobe favoring transient hepatic attenuation without underlying lesion.  Prominent GB wall thickening (likely 2/2 hypoalbuminemia).  Trace ascites, cirrhosis with portal hypertension, right gastric varices, small uphill paraesophageal varices, splenomegaly -Variceal screening: To schedule EGD today -Serologic evaluation: Completed 04/2018. No concomitant injury/disease -Viral hepatitis vaccination: Started hepatitis B series. Immunity to HAV -Flu vaccine: 2020 -Pneumonia vaccine: 2020 -Liver biopsy: None -Cirrhosis medications:Lasix 40 mg, Aldactone 100 mg, ciprofloxacin 250 mg weekly, lactulose, potassium -MELD:25 --> 14 -Child Pugh score: C (10 pts)  HPI:     Patient is a 39  y.o. Jason [name] presenting to the [facility] for follow-up.  Was last seen by me on 12/23/2018.  At that time was doing well, abstaining from all alcohol (last drink was prior to hospitalization 04/2018).  Good social support network and no relapse.  At that time, had plan for EGD at [facility] for EV screening, but not yet completed.  Was referred to [facility] given elevated MELD (25 points) despite EtOH abstinence. Was seen last week 07/09/19. Needs to secure insurance before OLT listing- has appt on 3/30 about this.    Since that appointment, right ankle surgery (ORIF) after fall at work.  Otherwise, feels well.  Some pain on his left side, but mostly because he's been sleeping on his left to avoid right-sided ankle boot/fracture.  Otherwise, good p.o. intake, no issues tolerating medications as prescribed.   -06/09/2019: INR 1.4 (improved), H/H 13/39, PLT 31 -05/07/2019: T bili 2.8 (improved), albumin 3.2, AST/ALT 73/48 (improved), ALP 266, protein 6.3, creatinine 0.62, NA 139; H/H 13/37, PLT 33 - MELD: 14 -No imaging since MRI 8/20 as above  History obtained via interpreter   Review of systems:     No chest pain, no SOB, no fevers, no urinary sx   Past Medical History:  Diagnosis Date  . Alcoholic cirrhosis of liver ([place])   . Anemia   . Ankle fracture    Right  . GERD (gastroesophageal reflux disease)   . Hypertension   . Nocturia   . Thrombocytopenia ([place])     Patient's surgical history, family medical history, social history, medications and allergies were all reviewed in Epic    Current Outpatient Medications  Medication Sig Dispense Refill  . acidophilus (RISAQUAD) CAPS capsule Take 1 capsule by mouth daily. 30 capsule 3  . baclofen (LIORESAL) 10 MG tablet Take 1 tablet (10 mg total) by mouth 3 (three) times daily as needed for muscle spasms. 20 each 0  .  folic acid (FOLVITE) 1 MG tablet TAKE 1 TABLET (1 MG TOTAL) BY MOUTH DAILY. 30 tablet 2  . furosemide (LASIX) 40 MG  tablet Take 1 tablet (40 mg total) by mouth 2 (two) times daily. Jason tablet 5  . lactulose, encephalopathy, (CHRONULAC) 10 GM/15ML SOLN TAKE 30 ML BY MOUTH TWICE DAILY 473 mL 1  . Multiple Vitamin (MULTIVITAMIN WITH MINERALS) TABS tablet Take 1 tablet by mouth daily. 30 tablet 2  . ondansetron (ZOFRAN) 4 MG tablet Take 1 tablet (4 mg total) by mouth every 6 (six) hours as needed for nausea or vomiting. 30 tablet 1  . pantoprazole (PROTONIX) 40 MG tablet Take 1 tablet (40 mg total) by mouth daily before lunch. 30 tablet 5  . potassium chloride SA (KLOR-CON) 20 MEQ tablet Take 1 tablet (20 mEq total) by mouth 2 (two) times daily. Jason tablet 5  . spironolactone (ALDACTONE) 100 MG tablet Take 1 tablet (100 mg total) by mouth daily. 30 tablet 5  . thiamine 100 MG tablet Take 1 tablet (100 mg total) by mouth daily. 30 tablet 5   No current facility-administered medications for this visit.    Physical Exam:     BP 128/76   Pulse 90   Temp 97.8 F (36.6 C)   Ht 5' 5" (1.651 m)   Wt 220 lb (99.8 kg) Comment: with boot  BMI 36.61 kg/m   GENERAL:  Pleasant Jason [name] in NAD PSYCH: : Cooperative, normal affect EENT:  conjunctiva pink, mucous membranes moist, neck supple without masses CARDIAC:  RRR, no murmur heard, no peripheral edema PULM: Normal respiratory effort, lungs CTA bilaterally, no wheezing ABDOMEN:  Nondistended, soft, nontender. No obvious masses, no hepatomegaly,  normal bowel sounds SKIN:  turgor, no lesions seen Musculoskeletal: In place on right foot/ankle  NEURO: Alert and oriented x 3, no focal neurologic deficits   IMPRESSION and PLAN:    1)Alcoholic Cirrhosis: Decompensated alcoholic cirrhosis with history of superimposed Alcoholic Hepatitis in 04/2018. Has since quit alcohol in 04/2018 without any recurrence.  Labs with some improvement in hepatic synthetic function.  -Plan for EGD at [facility] for EV screening.Plan to be done at [facility] given radiographic evidence of varices and  elevated MELD (14) and Child Pugh (C) scores -Repeat labs in 6 months.  - Continue follow-up with [facility] in 3 months -Discussed major barrier to OLT his lack of insurance.  He has had appointment at the end of this month to assist in this -Resume Lasix and Aldactone at current dose -Resume lactulose -Resume ciprofloxacin for SBP prophylaxis per prior ID recommendations -Complete hepatitis B vaccine series today -Repeat RUQ US for ongoing HCC screening -Resume low-sodium diet -Resume abstinence of all alcohol -Resume vitamin supplements -Folic acid and lactulose refilled  2)Hepatoma screening: 1.1 x 0.9 cm arterial enhancing lesion in segment 2 of the liver without capsule or typical washout.  This was not seen on repeat MRI 11/2018. AFP normal at 3.1.  -RUQ US as above  3) Ascites: Well-controlled on current diuretics  4) LE Edema: Well-controlled with diuretics and low-sodium diet. Resume current therapy.  5) Alcohol abuse disorder: Has been alcohol free since hospital discharge in 04/2018.   Again applauded his efforts and continued desire to maintain complete abstinence of alcohol and encouraged him that this is undoubtedly the most important thing he can do for his overall health and improved morbidity/mortality.    6) Portal hypertension: Treatment as above  7) Hepatic encephalopathy: -Well-controlled on lactulose       The indications, risks, and benefits of EGD were explained to the patient in detail. Risks include but are not limited to bleeding, perforation, adverse reaction to medications, and cardiopulmonary compromise. Sequelae include but are not limited to the possibility of surgery, hositalization, and mortality.  Elevated risks due to underlying cirrhosis and possibility for esophageal variceal banding.  The patient verbalized understanding and wished to proceed. All questions answered, referred to scheduler. Further recommendations pending results  of the exam.         Shellia Cleverly ,DO, FACG 07/13/2019, 2:09 PM

## 2019-07-13 NOTE — Patient Instructions (Addendum)
You have been scheduled for an abdominal ultrasound at [facility] (1st floor of hospital) on 07/16/19 at 10. Please arrive 15 minutes prior to your appointment for registration. Make certain not to have anything to eat or drink 6 hours prior to your appointment. Should you need to reschedule your appointment, please contact radiology at [phone]. This test typically takes about 30 minutes to perform   You have been scheduled for an endoscopy. Please follow written instructions given to you at your visit today. If you use inhalers (even only as needed), please bring them with you on the day of your procedure. Your physician has requested that you go to www.startemmi.com and enter the access code given to you at your visit today. This web site gives a general overview about your procedure. However, you should still follow specific instructions given to you by our office regarding your preparation for the procedure.  We have sent the following medications to your pharmacy for you to pick up at your convenience:   Follow up in 3-6 months  It was a pleasure to see you today!  Doristine Locks, D.O.    Due to recent COVID-19 restrictions implemented by our local and state authorities and in an effort to keep both patients and staff as safe as possible, our hospital system now requires COVID-19 testing prior to any scheduled hospital procedure. Please go to our [facility] location drive thru testing site ([address], [place], [place] [address]) on 08/06/19 at  945am. There will be multiple testing areas, the first checkpoint being for pre-procedure/surgery testing. Get into the right (yellow) lane that leads to the PAT testing team. You will not be billed at the time of testing but may receive a bill later depending on your insurance. The approximate cost of the test is $100. You must agree to quarantine from the time of your testing until the procedure date on 08/10/19 .  This should include staying at home with ONLY the people you live with. Avoid take-out, grocery store shopping or leaving the house for any non-emergent reason. Failure to have your COVID-19 test done on the date and time you have been scheduled will result in cancellation of procedure. Please call our office at [phone] if you have any questions.

## 2019-07-16 ENCOUNTER — Ambulatory Visit (HOSPITAL_BASED_OUTPATIENT_CLINIC_OR_DEPARTMENT_OTHER)
Admission: RE | Admit: 2019-07-16 | Discharge: 2019-07-16 | Disposition: A | Discharge status: Home / Self Care | Payer: Self-pay | Source: Ambulatory Visit | Attending: Gastroenterology | Admitting: Gastroenterology

## 2019-07-16 ENCOUNTER — Other Ambulatory Visit: Payer: Self-pay

## 2019-07-16 DIAGNOSIS — K7031 Alcoholic cirrhosis of liver with ascites: Secondary | ICD-10-CM | POA: Insufficient documentation

## 2019-07-16 DIAGNOSIS — K7030 Alcoholic cirrhosis of liver without ascites: Secondary | ICD-10-CM | POA: Insufficient documentation

## 2019-07-28 ENCOUNTER — Other Ambulatory Visit: Payer: Self-pay

## 2019-07-28 ENCOUNTER — Ambulatory Visit: Payer: Self-pay | Attending: Internal Medicine

## 2019-07-28 MED FILL — LACTULOSE 10 GM/15 ML SOLN: 10 | 15 days supply | Qty: 473 | Fill #1

## 2019-07-28 MED FILL — ?PANTOPRAZOLE SO DR 40MG TA: 40 | 30 days supply | Qty: 30 | Fill #4

## 2019-07-28 MED FILL — ?FUROSEMIDE 40 MG TABLET: 40 | 30 days supply | Qty: 60 | Fill #4

## 2019-07-28 MED FILL — ?SPIRONOLACTONE 100MG TAB: 100 | 30 days supply | Qty: 30 | Fill #4

## 2019-07-28 MED FILL — FOLIC ACID 1 MG TABS: 1 | 30 days supply | Qty: 30 | Fill #0

## 2019-07-28 MED FILL — POTASSIUM CL ER 20 MEQ TABL: 20 | 30 days supply | Qty: 60 | Fill #4

## 2019-08-06 ENCOUNTER — Other Ambulatory Visit (HOSPITAL_COMMUNITY)
Admission: RE | Admit: 2019-08-06 | Discharge: 2019-08-06 | Disposition: A | Discharge status: Home / Self Care | Payer: HRSA Program | Source: Ambulatory Visit | Attending: Gastroenterology | Admitting: Gastroenterology

## 2019-08-06 DIAGNOSIS — Z01812 Encounter for preprocedural laboratory examination: Secondary | ICD-10-CM | POA: Insufficient documentation

## 2019-08-06 DIAGNOSIS — Z20822 Contact with and (suspected) exposure to covid-19: Secondary | ICD-10-CM | POA: Insufficient documentation

## 2019-08-06 LAB — SARS CORONAVIRUS 2 (TAT 6-24 HRS): SARS Coronavirus 2: NEGATIVE

## 2019-08-09 NOTE — Anesthesia Preprocedure Evaluation (Signed)
Anesthesia Evaluation  Patient identified by MRN, date of birth, ID band Patient awake    Reviewed: Allergy & Precautions, NPO status , Patient's Chart, lab work & pertinent test results  Airway Mallampati: II  TM Distance: >3 FB Neck ROM: Full    Dental  (+) Dental Advisory Given   Pulmonary neg pulmonary ROS, former smoker,    Pulmonary exam normal breath sounds clear to auscultation       Cardiovascular hypertension, Pt. on medications Normal cardiovascular exam Rhythm:Regular Rate:Normal  Echo 05/03/2018: Normal LV size with EF 55-60%. Normal RV size and systolicfunction. No signifcant valvular abnormalities. If there isconcern for endocarditis, valves were not seen well enough todefinitively exclude vegetation. Mild pulmonary hypertension.    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   Anesthesia Other Findings   Reproductive/Obstetrics negative OB ROS                             Anesthesia Physical  Anesthesia Plan  ASA: IV  Anesthesia Plan: MAC   Post-op Pain Management:    Induction: Intravenous  PONV Risk Score and Plan: 1 and Ondansetron, Propofol infusion, TIVA, Treatment may vary due to age or medical condition and Midazolam  Airway Management Planned: Natural Airway  Additional Equipment: None  Intra-op Plan:   Post-operative Plan:   Informed Consent: I have reviewed the patients History and Physical, chart, labs and  discussed the procedure including the risks, benefits and alternatives for the proposed anesthesia with the patient or authorized representative who has indicated his/her understanding and acceptance.     Dental advisory given  Plan Discussed with: CRNA  Anesthesia Plan Comments:         Anesthesia Quick Evaluation

## 2019-08-10 ENCOUNTER — Encounter (HOSPITAL_COMMUNITY): Payer: Self-pay | Admitting: Gastroenterology

## 2019-08-10 ENCOUNTER — Other Ambulatory Visit: Payer: Self-pay

## 2019-08-10 ENCOUNTER — Encounter (HOSPITAL_COMMUNITY)
Admission: RE | Disposition: A | Discharge status: Home / Self Care | Payer: Self-pay | Source: Home / Self Care | Attending: Gastroenterology

## 2019-08-10 ENCOUNTER — Ambulatory Visit (HOSPITAL_COMMUNITY): Payer: Self-pay | Admitting: Anesthesiology

## 2019-08-10 ENCOUNTER — Telehealth: Payer: Self-pay | Admitting: Internal Medicine

## 2019-08-10 ENCOUNTER — Ambulatory Visit (HOSPITAL_COMMUNITY)
Admission: RE | Admit: 2019-08-10 | Discharge: 2019-08-10 | Disposition: A | Discharge status: Home / Self Care | Payer: Self-pay | Attending: Gastroenterology | Admitting: Gastroenterology

## 2019-08-10 DIAGNOSIS — Z6836 Body mass index (BMI) 36.0-36.9, adult: Secondary | ICD-10-CM | POA: Insufficient documentation

## 2019-08-10 DIAGNOSIS — K2950 Unspecified chronic gastritis without bleeding: Secondary | ICD-10-CM | POA: Insufficient documentation

## 2019-08-10 DIAGNOSIS — K766 Portal hypertension: Secondary | ICD-10-CM | POA: Insufficient documentation

## 2019-08-10 DIAGNOSIS — Z79899 Other long term (current) drug therapy: Secondary | ICD-10-CM | POA: Insufficient documentation

## 2019-08-10 DIAGNOSIS — Z87891 Personal history of nicotine dependence: Secondary | ICD-10-CM | POA: Insufficient documentation

## 2019-08-10 DIAGNOSIS — I8510 Secondary esophageal varices without bleeding: Secondary | ICD-10-CM | POA: Insufficient documentation

## 2019-08-10 DIAGNOSIS — K3189 Other diseases of stomach and duodenum: Secondary | ICD-10-CM | POA: Insufficient documentation

## 2019-08-10 DIAGNOSIS — K7460 Unspecified cirrhosis of liver: Secondary | ICD-10-CM

## 2019-08-10 DIAGNOSIS — K7030 Alcoholic cirrhosis of liver without ascites: Secondary | ICD-10-CM

## 2019-08-10 DIAGNOSIS — R600 Localized edema: Secondary | ICD-10-CM | POA: Insufficient documentation

## 2019-08-10 DIAGNOSIS — K7290 Hepatic failure, unspecified without coma: Secondary | ICD-10-CM | POA: Insufficient documentation

## 2019-08-10 DIAGNOSIS — K219 Gastro-esophageal reflux disease without esophagitis: Secondary | ICD-10-CM | POA: Insufficient documentation

## 2019-08-10 DIAGNOSIS — I10 Essential (primary) hypertension: Secondary | ICD-10-CM | POA: Insufficient documentation

## 2019-08-10 DIAGNOSIS — I8500 Esophageal varices without bleeding: Secondary | ICD-10-CM

## 2019-08-10 DIAGNOSIS — E669 Obesity, unspecified: Secondary | ICD-10-CM | POA: Insufficient documentation

## 2019-08-10 DIAGNOSIS — B9681 Helicobacter pylori [H. pylori] as the cause of diseases classified elsewhere: Secondary | ICD-10-CM | POA: Insufficient documentation

## 2019-08-10 DIAGNOSIS — K7031 Alcoholic cirrhosis of liver with ascites: Secondary | ICD-10-CM | POA: Insufficient documentation

## 2019-08-10 HISTORY — PX: ESOPHAGEAL BANDING: SHX5518

## 2019-08-10 HISTORY — PX: ESOPHAGOGASTRODUODENOSCOPY (EGD) WITH PROPOFOL: SHX5813

## 2019-08-10 HISTORY — PX: BIOPSY: SHX5522

## 2019-08-10 SURGERY — ESOPHAGOGASTRODUODENOSCOPY (EGD) WITH PROPOFOL
Anesthesia: Monitor Anesthesia Care

## 2019-08-10 MED ORDER — ONDANSETRON HCL 4 MG/2ML IJ SOLN
INTRAMUSCULAR | Status: DC | PRN
  Administered 2019-08-10: 4 mg via INTRAVENOUS

## 2019-08-10 MED ORDER — LACTATED RINGERS IV SOLN
INTRAVENOUS | Status: DC
  Administered 2019-08-10: 08:00:00 1000 mL via INTRAVENOUS

## 2019-08-10 MED ORDER — FAMOTIDINE 20 MG PO TABS: 20.0000 mg | ORAL_TABLET | Freq: Every day | ORAL | 4 refills | Status: DC

## 2019-08-10 MED ORDER — PROPOFOL 500 MG/50ML IV EMUL
INTRAVENOUS | Status: DC | PRN
  Administered 2019-08-10: 150 ug/kg/min via INTRAVENOUS
  Administered 2019-08-10 (×2): 20 mg via INTRAVENOUS
  Administered 2019-08-10: 50 mg via INTRAVENOUS

## 2019-08-10 MED ORDER — LIDOCAINE HCL (CARDIAC) PF 100 MG/5ML IV SOSY
PREFILLED_SYRINGE | INTRAVENOUS | Status: DC | PRN
  Administered 2019-08-10: 100 mg via INTRATRACHEAL

## 2019-08-10 MED ORDER — SODIUM CHLORIDE 0.9 % IV SOLN: INTRAVENOUS | Status: DC

## 2019-08-10 MED ORDER — PROPOFOL 500 MG/50ML IV EMUL
INTRAVENOUS | Status: AC
  Filled 2019-08-10: qty 100

## 2019-08-10 MED FILL — ONDANSETRON HCL 4 MG TABLET: 4 | 7 days supply | Qty: 30 | Fill #1

## 2019-08-10 MED FILL — FAMOTIDINE 20 MG TABS: 20 | 30 days supply | Qty: 30 | Fill #0

## 2019-08-10 MED FILL — LACTULOSE 10 GM/15 ML SOLN: 10 | 8 days supply | Qty: 473 | Fill #0

## 2019-08-10 SURGICAL SUPPLY — 15 items

## 2019-08-10 NOTE — Transfer of Care (Signed)
Immediate Anesthesia Transfer of Care Note  Patient: Jason [name]  Procedure(s) Performed: Procedure(s): ESOPHAGOGASTRODUODENOSCOPY (EGD) WITH PROPOFOL (N/A)  Patient Location: PACU  Anesthesia Type:MAC  Level of Consciousness:  sedated, patient cooperative and responds to stimulation  Airway & Oxygen Therapy:Patient Spontanous Breathing and Patient connected to face mask oxgen  Post-op Assessment:  Report given to PACU RN and Post -op Vital signs reviewed and stable  Post vital signs:  Reviewed and stable  Last Vitals:  Vitals:   08/10/19 0729 08/10/19 0847  BP: 137/75 (!) 145/76  Pulse: 72 89  Resp: 20 13  Temp: 37.1 C   SpO2: 814% 481%    Complications: No apparent anesthesia complications

## 2019-08-10 NOTE — Interval H&P Note (Signed)
History and Physical Interval Note:  08/10/2019 7:32 AM  Jason [name] Jason [name]  has presented today for surgery, with the diagnosis of cirrhosis.  The various methods of treatment have been discussed with the patient and family. After consideration of risks, benefits and other options for treatment, the patient has consented to  Procedure(s): ESOPHAGOGASTRODUODENOSCOPY (EGD) WITH PROPOFOL (N/A) and possible esophageal band ligation as a surgical intervention.  The patient's history has been reviewed, patient examined, no change in status, stable for surgery.  I have reviewed the patient's chart and labs.  Questions were answered to the patient's satisfaction.     [name]

## 2019-08-10 NOTE — Op Note (Signed)
[facility] Patient Name: Carolyne LittlesJose Arriola Gonzalez Procedure Date: 08/10/2019 MRN: [id number] Attending MD: Doristine LocksVito Bianca Raneri , MD Date of Birth: December 07, 1980 CSN: [id number] Age: 1934 Admit Type: Outpatient Procedure:                Upper GI endoscopy Indications:              Cirrhosis rule out esophageal varices                           39 yo male with a history of decompensated                            alcoholic cirrhosis complicated by ascites, SBP,                            hepatic encephalopathy, coagulopathy, alcoholic                            hepatitis (2020), varices noted on cross-sectional                            imaging, presents for index EGD for variceal                            screening. No prior history of variceal bleed. Providers:                Doristine LocksVito Najmo Pardue, MD, Tillie Fantasiaonna Pickering, RN, Adolph PollackElizabeth                            Mathai RN,Guillaume Gretchen ShortAwaka, Technician Referring MD:              Medicines:                Monitored Anesthesia Care Complications:            No immediate complications. Estimated Blood Loss:     Estimated blood loss was minimal. Procedure:                Pre-Anesthesia Assessment:                           - Prior to the procedure, a History and Physical                            was performed, and patient medications and                            allergies were reviewed. The patient's tolerance of                            previous anesthesia was also reviewed. The risks                            and benefits of the procedure and the sedation                            options  and risks were discussed with the patient.                            All questions were answered, and informed consent                            was obtained. Prior Anticoagulants: The patient has                            taken no previous anticoagulant or antiplatelet                            agents. ASA Grade Assessment: IV - A patient with                             severe systemic disease that is a constant threat                            to life. After reviewing the risks and benefits,                            the patient was deemed in satisfactory condition to                            undergo the procedure.                           After obtaining informed consent, the endoscope was                            passed under direct vision. Throughout the                            procedure, the patient's blood pressure, pulse, and                            oxygen saturations were monitored continuously. The                            [id number]-[id number] ([id number] Olympus gastroscope was                            introduced through the mouth, and advanced to the                            second part of duodenum. The upper GI endoscopy was                            accomplished without difficulty. The patient                            tolerated the procedure well. Scope In: Scope Out: Findings:      Single column of grade II varices was found in the lower  third of the       esophagus, with a red wale sign present. The varix was 5 mm in largest       diameter. Two bands were successfully placed with complete eradication,       resulting in deflation of the varix. There was no bleeding during the       procedure.      The Z-line was regular and was found 42 cm from the incisors.      The upper third of the esophagus and middle third of the esophagus were       normal.      Mild portal hypertensive gastropathy was found in the gastric fundus and       in the gastric body. Biopsies were taken with a cold forceps for       histology. Estimated blood loss was minimal.      The incisura and gastric antrum were normal.      The duodenal bulb, first portion of the duodenum and second portion of       the duodenum were normal. Impression:               - Single column of Grade II esophageal varices with                             high risk stigmata (red wale sign). Completely                            eradicated with 2 bands placed.                           - Z-line regular, 42 cm from the incisors.                           - Normal upper third of esophagus and middle third                            of esophagus.                           - Portal hypertensive gastropathy. Biopsied.                           - Normal incisura and antrum.                           - Normal duodenal bulb, first portion of the                            duodenum and second portion of the duodenum. Moderate Sedation:      Not Applicable - Patient had care per Anesthesia. Recommendation:           - Patient has a contact number available for                            emergencies. The signs and symptoms of potential  delayed complications were discussed with the                            patient. Return to normal activities tomorrow.                            Written discharge instructions were provided to the                            patient.                           - Soft diet today.                           - Continue present medications.                           - Await pathology results.                           - Repeat upper endoscopy in 3-8 weeks to evalaute                            for retreatment and for surveillance.                           - Return to GI clinic in 3 months.                           - PPI nor Beta blocker started at this time due to                            history of SBP. Procedure Code(s):        --- Professional ---                           [phone], Esophagogastroduodenoscopy, flexible,                            transoral; with band ligation of esophageal/gastric                            varices                           43239, Esophagogastroduodenoscopy, flexible,                            transoral; with biopsy, single or multiple Diagnosis Code(s):         --- Professional ---                           K74.60, Unspecified cirrhosis of liver                           I85.10, Secondary esophageal varices without  bleeding                           K76.6, Portal hypertension                           K31.89, Other diseases of stomach and duodenum CPT copyright 2019 American Medical Association. All rights reserved. The codes documented in this report are preliminary and upon coder review may  be revised to meet current compliance requirements. Doristine Locks, MD 08/10/2019 8:52:35 AM Number of Addenda: 0

## 2019-08-10 NOTE — Telephone Encounter (Signed)
Pt wants to know is it ok if he gets the covid shot

## 2019-08-10 NOTE — Anesthesia Postprocedure Evaluation (Signed)
Anesthesia Post Note  Patient: Jason [name]  Procedure(s) Performed: ESOPHAGOGASTRODUODENOSCOPY (EGD) WITH PROPOFOL (N/A ) BIOPSY     Patient location during evaluation: PACU Anesthesia Type: MAC Level of consciousness: awake and alert Pain management: pain level controlled Vital Signs Assessment: post-procedure vital signs reviewed and stable Respiratory status: spontaneous breathing Cardiovascular status: stable Anesthetic complications: no    Last Vitals:  Vitals:   08/10/19 0850 08/10/19 0900  BP: 138/84 (!) 148/80  Pulse: 85 75  Resp: 20 13  Temp:    SpO2: 100% 100%    Last Pain:  Vitals:   08/10/19 0900  TempSrc:   PainSc: 0-No pain                 Nolon Nations

## 2019-08-11 ENCOUNTER — Other Ambulatory Visit: Payer: Self-pay

## 2019-08-11 ENCOUNTER — Encounter: Payer: Self-pay | Admitting: *Deleted

## 2019-08-11 LAB — SURGICAL PATHOLOGY

## 2019-08-11 NOTE — Telephone Encounter (Signed)
Contacted pt and Jason [name] interpreted. Made pt aware that he can get the COVID Vaccine. Pt doesn't have any questions or concerns

## 2019-08-12 ENCOUNTER — Other Ambulatory Visit: Payer: Self-pay | Admitting: Gastroenterology

## 2019-08-12 DIAGNOSIS — A048 Other specified bacterial intestinal infections: Secondary | ICD-10-CM

## 2019-08-12 MED ORDER — METRONIDAZOLE 250 MG PO TABS: 250.0000 mg | ORAL_TABLET | Freq: Four times a day (QID) | ORAL | 0 refills | Status: AC

## 2019-08-12 MED ORDER — DOXYCYCLINE HYCLATE 100 MG PO TABS: 100.0000 mg | ORAL_TABLET | Freq: Two times a day (BID) | ORAL | 0 refills | Status: AC

## 2019-08-12 MED ORDER — BISMUTH SUBSALICYLATE 262 MG PO CHEW: 524.0000 mg | CHEWABLE_TABLET | Freq: Four times a day (QID) | ORAL | 0 refills | Status: AC

## 2019-08-12 MED ORDER — FAMOTIDINE 20 MG PO TABS: ORAL_TABLET | ORAL | 3 refills | Status: DC

## 2019-08-12 MED FILL — ?DOXYCYCLINE HYCLATE 100MG: 100 | 14 days supply | Qty: 28 | Fill #0

## 2019-08-12 MED FILL — metroNIDAZOLE 250 MG TABS: 250 | 14 days supply | Qty: 56 | Fill #0

## 2019-08-13 ENCOUNTER — Encounter: Payer: Self-pay | Admitting: Gastroenterology

## 2019-08-24 ENCOUNTER — Other Ambulatory Visit: Payer: Self-pay | Admitting: Physician Assistant

## 2019-08-24 DIAGNOSIS — K7031 Alcoholic cirrhosis of liver with ascites: Secondary | ICD-10-CM

## 2019-08-24 MED FILL — ?SPIRONOLACTONE 100MG TAB: 100 | 30 days supply | Qty: 30 | Fill #5

## 2019-08-24 MED FILL — FOLIC ACID 1 MG TABS: 1 | 30 days supply | Qty: 30 | Fill #1

## 2019-08-24 MED FILL — POTASSIUM CL ER 20 MEQ TABL: 20 | 30 days supply | Qty: 60 | Fill #5

## 2019-08-24 MED FILL — ?FUROSEMIDE 40 MG TABLET: 40 | 30 days supply | Qty: 60 | Fill #5

## 2019-08-24 MED FILL — LACTULOSE 10 GM/15 ML SOLN: 10 | 8 days supply | Qty: 473 | Fill #1

## 2019-08-24 MED FILL — BACLOFEN 10 MG TABLET: 10 | 6 days supply | Qty: 20 | Fill #0

## 2019-08-26 MED FILL — ONDANSETRON HCL 4 MG TABLET: 4 | 7 days supply | Qty: 30 | Fill #0

## 2019-09-03 ENCOUNTER — Ambulatory Visit: Payer: Self-pay | Attending: Internal Medicine | Admitting: Internal Medicine

## 2019-09-03 ENCOUNTER — Other Ambulatory Visit: Payer: Self-pay

## 2019-09-03 ENCOUNTER — Encounter: Payer: Self-pay | Admitting: Internal Medicine

## 2019-09-03 DIAGNOSIS — K7460 Unspecified cirrhosis of liver: Secondary | ICD-10-CM

## 2019-09-03 DIAGNOSIS — K7290 Hepatic failure, unspecified without coma: Secondary | ICD-10-CM

## 2019-09-03 DIAGNOSIS — I8510 Secondary esophageal varices without bleeding: Secondary | ICD-10-CM

## 2019-09-03 NOTE — Progress Notes (Signed)
Virtual Visit via Telephone Note Due to current restrictions/limitations of in-office visits due to the COVID-19 pandemic, this scheduled clinical appointment was converted to a telehealth visit  I connected with [place] on 09/03/19 at 4:11 p.m by telephone and verified that I am speaking with the correct person using two identifiers. I am in my office.  The patient is at home.  Only the patient , myself and Mila Merry from [employer] [phone]) participated in this encounter.   I discussed the limitations, risks, security and privacy concerns of performing an evaluation and management service by telephone and the availability of in person appointments. I also discussed with the patient that there may be a patient responsible charge related to this service. The patient expressed understanding and agreed to proceed.   History of Present Illness: Patient with history of severeETOH usedisorder in remission, alcohol induced cirrhosis, SBP, varices  Still followed by GI Dr. Bryan Lemma Had recent EGD.  He has grade 2 esophageal varices with high risk stigmata.  Gastroenterologist has in his note that PPI nor nonselective beta-blocker were started because of history of SBP Compliant with meds including Spironolactone and Furosemide.  No increase abdominal girth recently.  No LE edema.  Limiting salt.  Good appetite. He remains free of ETOH Recent liver US 06/2019 revealed no liver lesions Not being seen at [facility] due to lack of insurance.  Outpatient Encounter Medications as of 09/03/2019  Medication Sig  . acidophilus (RISAQUAD) CAPS capsule Take 1 capsule by mouth daily. (Patient not taking: Reported on 08/04/2019)  . baclofen (LIORESAL) 10 MG tablet Take 1 tablet (10 mg total) by mouth 3 (three) times daily as needed for muscle spasms.  . famotidine (PEPCID) 20 MG tablet Take 1 tablet (20 mg total) by mouth daily.  . famotidine (PEPCID) 20 MG tablet Take  Pepcid 20 mg twice daily for 14 days then take pepcid 20 mg daily  . folic acid (FOLVITE) 1 MG tablet Take 1 tablet (1 mg total) by mouth daily.  . furosemide (LASIX) 40 MG tablet Take 1 tablet (40 mg total) by mouth 2 (two) times daily.  Marland Kitchen lactulose, encephalopathy, (CHRONULAC) 10 GM/15ML SOLN TAKE 30 ML BY MOUTH TWICE DAILY (Patient taking differently: Take 20 g by mouth 2 (two) times daily. )  . Multiple Vitamin (MULTIVITAMIN WITH MINERALS) TABS tablet Take 1 tablet by mouth daily.  . ondansetron (ZOFRAN) 4 MG tablet TAKE 1 TABLET (4 MG TOTAL) BY MOUTH EVERY 6 (SIX) HOURS AS NEEDED FOR NAUSEA OR VOMITING.  . potassium chloride SA (KLOR-CON) 20 MEQ tablet Take 1 tablet (20 mEq total) by mouth 2 (two) times daily.  Marland Kitchen spironolactone (ALDACTONE) 100 MG tablet Take 1 tablet (100 mg total) by mouth daily.  Marland Kitchen thiamine 100 MG tablet Take 1 tablet (100 mg total) by mouth daily.   No facility-administered encounter medications on file as of 09/03/2019.      Observations/Objective: Lab Results  Component Value Date   WBC 5.0 06/09/2019   HGB 12.6 (L) 06/09/2019   HCT 37.0 (L) 06/09/2019   MCV 100.3 (H) 06/09/2019   PLT 31 (L) 06/09/2019     Chemistry      Component Value Date/Time   NA 141 06/09/2019 1000   NA 139 05/07/2019 1413   K 3.9 06/09/2019 1000   CL 108 06/09/2019 1000   CO2 21 05/07/2019 1413   BUN 12 06/09/2019 1000   BUN 12 05/07/2019 1413   CREATININE 0.40 (L) 06/09/2019  1000      Component Value Date/Time   CALCIUM 8.8 05/07/2019 1413   ALKPHOS 266 (H) 05/07/2019 1413   AST 73 (H) 05/07/2019 1413   ALT 48 (H) 05/07/2019 1413   BILITOT 2.8 (H) 05/07/2019 1413       Assessment and Plan: 1. Decompensated hepatic cirrhosis (HCC) 2.  Esophageal varices and cirrhosis Followed by GI and has a follow-up appointment with him again in about 3 weeks.  Commended him and encouraged him to remain free of alcohol.  Continue current medications - CBC; Future - Comprehensive  metabolic panel; Future   Follow Up Instructions: 3 to 4 months   I discussed the assessment and treatment plan with the patient. The patient was provided an opportunity to ask questions and all were answered. The patient agreed with the plan and demonstrated an understanding of the instructions.   The patient was advised to call back or seek an in-person evaluation if the symptoms worsen or if the condition fails to improve as anticipated.  I provided 18 minutes of non-face-to-face time during this encounter.   Jonah Blue, MD

## 2019-09-10 ENCOUNTER — Ambulatory Visit: Payer: Self-pay | Attending: Internal Medicine

## 2019-09-10 ENCOUNTER — Other Ambulatory Visit: Payer: Self-pay

## 2019-09-10 DIAGNOSIS — K7290 Hepatic failure, unspecified without coma: Secondary | ICD-10-CM

## 2019-09-10 DIAGNOSIS — K7460 Unspecified cirrhosis of liver: Secondary | ICD-10-CM

## 2019-09-11 LAB — COMPREHENSIVE METABOLIC PANEL
ALT: 32 IU/L (ref 0–44)
AST: 63 IU/L — ABNORMAL HIGH (ref 0–40)
Albumin/Globulin Ratio: 1.1 — ABNORMAL LOW (ref 1.2–2.2)
Albumin: 3.2 g/dL — ABNORMAL LOW (ref 4.0–5.0)
Alkaline Phosphatase: 311 IU/L — ABNORMAL HIGH (ref 39–117)
BUN/Creatinine Ratio: 13 (ref 9–20)
BUN: 10 mg/dL (ref 6–20)
Bilirubin Total: 2.6 mg/dL — ABNORMAL HIGH (ref 0.0–1.2)
CO2: 21 mmol/L (ref 20–29)
Calcium: 8.7 mg/dL (ref 8.7–10.2)
Chloride: 104 mmol/L (ref 96–106)
Creatinine, Ser: 0.79 mg/dL (ref 0.76–1.27)
GFR calc Af Amer: 134 mL/min/{1.73_m2} (ref >59)
GFR calc non Af Amer: 116 mL/min/{1.73_m2} (ref >59)
Globulin, Total: 2.9 g/dL (ref 1.5–4.5)
Glucose: 125 mg/dL — ABNORMAL HIGH (ref 65–99)
Potassium: 3.9 mmol/L (ref 3.5–5.2)
Sodium: 135 mmol/L (ref 134–144)
Total Protein: 6.1 g/dL (ref 6.0–8.5)

## 2019-09-11 LAB — CBC
Hematocrit: 37.9 % (ref 37.5–51.0)
Hemoglobin: 13.5 g/dL (ref 13.0–17.7)
MCH: 33.6 pg — ABNORMAL HIGH (ref 26.6–33.0)
MCHC: 35.6 g/dL (ref 31.5–35.7)
MCV: 94 fL (ref 79–97)
Platelets: 43 10*3/uL — CL (ref 150–450)
RBC: 4.02 x10E6/uL — ABNORMAL LOW (ref 4.14–5.80)
RDW: 14.1 % (ref 11.6–15.4)
WBC: 5.3 10*3/uL (ref 3.4–10.8)

## 2019-09-23 ENCOUNTER — Other Ambulatory Visit: Payer: Self-pay | Admitting: Internal Medicine

## 2019-09-23 ENCOUNTER — Other Ambulatory Visit: Payer: Self-pay | Admitting: Gastroenterology

## 2019-09-23 ENCOUNTER — Other Ambulatory Visit: Payer: Self-pay | Admitting: Family Medicine

## 2019-09-23 DIAGNOSIS — K7031 Alcoholic cirrhosis of liver with ascites: Secondary | ICD-10-CM

## 2019-09-23 MED FILL — FOLIC ACID 1 MG TABS: 1 | 30 days supply | Qty: 30 | Fill #2

## 2019-09-23 MED FILL — LACTULOSE 10 GM/15 ML SOLN: 10 | 15 days supply | Qty: 473 | Fill #0

## 2019-09-23 MED FILL — ?SPIRONOLACTONE 100MG TAB: 100 | 30 days supply | Qty: 30 | Fill #1

## 2019-09-24 MED FILL — ?FUROSEMIDE 4OMG TABLETS: 40 | 30 days supply | Qty: 60 | Fill #0

## 2019-09-24 MED FILL — POTASSIUM CL ER 20 MEQ TABL: 20 | 30 days supply | Qty: 60 | Fill #0

## 2019-10-19 ENCOUNTER — Other Ambulatory Visit: Payer: Self-pay | Admitting: Gastroenterology

## 2019-10-19 DIAGNOSIS — K7031 Alcoholic cirrhosis of liver with ascites: Secondary | ICD-10-CM

## 2019-10-19 MED FILL — FAMOTIDINE 20 MG TABS: 20 | 30 days supply | Qty: 30 | Fill #1

## 2019-10-19 MED FILL — ONDANSETRON HCL 4 MG TABLET: 4 | 7 days supply | Qty: 30 | Fill #1

## 2019-10-19 MED FILL — LACTULOSE 10 GM/15 ML SOLN: 10 | 15 days supply | Qty: 473 | Fill #1

## 2019-10-20 MED FILL — FOLIC ACID 1 MG TABS: 1 | 30 days supply | Qty: 30 | Fill #0

## 2019-11-03 ENCOUNTER — Other Ambulatory Visit: Payer: Self-pay | Admitting: Gastroenterology

## 2019-11-03 DIAGNOSIS — K7031 Alcoholic cirrhosis of liver with ascites: Secondary | ICD-10-CM

## 2019-11-03 MED FILL — LACTULOSE 10 GM/15 ML SOLN: 10 | 15 days supply | Qty: 473 | Fill #0

## 2019-11-03 MED FILL — ?SPIRONOLACTONE 100MG TAB: 100 | 30 days supply | Qty: 30 | Fill #2

## 2019-11-03 MED FILL — FOLIC ACID 1 MG TABS: 1 | 30 days supply | Qty: 30 | Fill #0

## 2019-11-03 MED FILL — POTASSIUM CL ER 20 MEQ TABL: 20 | 30 days supply | Qty: 60 | Fill #1

## 2019-11-12 MED FILL — LACTULOSE 10 GM/15 ML SOLN: 10 | 7 days supply | Qty: 473 | Fill #0

## 2019-11-19 MED FILL — LACTULOSE 10 GM/15 ML SOLN: 10 | 7 days supply | Qty: 473 | Fill #1

## 2019-11-19 MED FILL — FAMOTIDINE 20 MG TABS: 20 | 30 days supply | Qty: 30 | Fill #2

## 2019-11-20 MED FILL — ?FUROSEMIDE 40 MG TABLET: 40 | 30 days supply | Qty: 60 | Fill #1

## 2019-11-27 ENCOUNTER — Other Ambulatory Visit: Payer: Self-pay | Admitting: Internal Medicine

## 2019-11-27 DIAGNOSIS — K7031 Alcoholic cirrhosis of liver with ascites: Secondary | ICD-10-CM

## 2019-11-27 MED FILL — ONDANSETRON HCL 4 MG TABLET: 4 | 7 days supply | Qty: 30 | Fill #0

## 2019-11-27 MED FILL — FOLIC ACID 1 MG TABS: 1 | 30 days supply | Qty: 30 | Fill #1

## 2019-11-27 NOTE — Telephone Encounter (Signed)
Requested medication (s) are due for refill today -unsure  Requested medication (s) are on the active medication list -yes  Future visit scheduled -no  Last refill: 10/19/19  Notes to clinic: Request for non delegated Rx  Requested Prescriptions  Pending Prescriptions Disp Refills   ondansetron (ZOFRAN) 4 MG tablet [Pharmacy Med Name: ONDANSETRON HCL 4 MG TABLET 4 Tablet] 30 tablet 1    Sig: TAKE 1 TABLET (4 MG TOTAL) BY MOUTH EVERY 6 (SIX) HOURS AS NEEDED FOR NAUSEA OR VOMITING.      Not Delegated - Gastroenterology: Antiemetics Failed - 11/27/2019 12:07 PM      Failed - This refill cannot be delegated      Passed - Valid encounter within last 6 months    Recent Outpatient Visits           2 months ago Decompensated hepatic cirrhosis [facility])   [facility] Marcine Matar, MD   5 months ago Ankle fracture, left, sequela   [facility] Jonah Blue B, MD   6 months ago Nonintractable headache, unspecified chronicity pattern, unspecified headache type   [facility] [place], [place], [place]   8 months ago Alcoholic cirrhosis of liver with ascites (HCC)   [facility] [place], [place], MD   11 months ago Epistaxis   [place] [facility] Marcine Matar, MD                  Requested Prescriptions  Pending Prescriptions Disp Refills   ondansetron (ZOFRAN) 4 MG tablet [Pharmacy Med Name: ONDANSETRON HCL 4 MG TABLET 4 Tablet] 30 tablet 1    Sig: TAKE 1 TABLET (4 MG TOTAL) BY MOUTH EVERY 6 (SIX) HOURS AS NEEDED FOR NAUSEA OR VOMITING.      Not Delegated - Gastroenterology: Antiemetics Failed - 11/27/2019 12:07 PM      Failed - This refill cannot be delegated      Passed - Valid encounter within last 6 months    Recent Outpatient Visits           2 months ago Decompensated hepatic cirrhosis [facility])   [place] [facility] Marcine Matar, MD   5 months ago Ankle fracture, left, sequela   [facility] Jonah Blue B, MD   6 months ago Nonintractable headache, unspecified chronicity pattern, unspecified headache type   [facility] [place], [place], [place]   8 months ago Alcoholic cirrhosis of liver with ascites [facility])   [place] [facility] [place], [place], [place]   11 months ago Epistaxis   [facility] Marcine Matar, MD

## 2019-12-03 ENCOUNTER — Other Ambulatory Visit: Payer: Self-pay | Admitting: Family Medicine

## 2019-12-03 ENCOUNTER — Other Ambulatory Visit: Payer: Self-pay | Admitting: Internal Medicine

## 2019-12-03 DIAGNOSIS — K7031 Alcoholic cirrhosis of liver with ascites: Secondary | ICD-10-CM

## 2019-12-03 MED FILL — ?SPIRONOLACTONE 100MG TAB: 100 | 30 days supply | Qty: 30 | Fill #0

## 2019-12-16 ENCOUNTER — Other Ambulatory Visit: Payer: Self-pay | Admitting: Gastroenterology

## 2019-12-16 DIAGNOSIS — K7031 Alcoholic cirrhosis of liver with ascites: Secondary | ICD-10-CM

## 2019-12-16 MED FILL — ONDANSETRON HCL 4 MG TABLET: 4 | 7 days supply | Qty: 30 | Fill #1

## 2019-12-16 MED FILL — ?SPIRONOLACTONE 100MG TAB: 100 | 30 days supply | Qty: 30 | Fill #0

## 2019-12-16 MED FILL — FAMOTIDINE 20 MG TABS: 20 | 30 days supply | Qty: 30 | Fill #3

## 2019-12-16 MED FILL — ?FUROSEMIDE 4OMG TABLETS: 40 | 30 days supply | Qty: 60 | Fill #2

## 2019-12-16 MED FILL — POTASSIUM CL ER 20 MEQ TAB: 20 | 30 days supply | Qty: 60 | Fill #2

## 2019-12-17 ENCOUNTER — Other Ambulatory Visit: Payer: Self-pay | Admitting: Gastroenterology

## 2019-12-17 MED FILL — LACTULOSE 10 GM/15 ML SOLN: 10 | 7 days supply | Qty: 473 | Fill #0

## 2019-12-22 ENCOUNTER — Ambulatory Visit (INDEPENDENT_AMBULATORY_CARE_PROVIDER_SITE_OTHER): Payer: Self-pay | Admitting: Gastroenterology

## 2019-12-22 ENCOUNTER — Encounter: Payer: Self-pay | Admitting: Gastroenterology

## 2019-12-22 VITALS — BP 138/82 | HR 83 | Ht 65.0 in | Wt 214.5 lb

## 2019-12-22 DIAGNOSIS — I8510 Secondary esophageal varices without bleeding: Secondary | ICD-10-CM

## 2019-12-22 DIAGNOSIS — R600 Localized edema: Secondary | ICD-10-CM

## 2019-12-22 DIAGNOSIS — K7682 Hepatic encephalopathy: Secondary | ICD-10-CM

## 2019-12-22 DIAGNOSIS — K7290 Hepatic failure, unspecified without coma: Secondary | ICD-10-CM

## 2019-12-22 DIAGNOSIS — K7460 Unspecified cirrhosis of liver: Secondary | ICD-10-CM

## 2019-12-22 DIAGNOSIS — K766 Portal hypertension: Secondary | ICD-10-CM

## 2019-12-22 DIAGNOSIS — K7031 Alcoholic cirrhosis of liver with ascites: Secondary | ICD-10-CM

## 2019-12-22 DIAGNOSIS — I8500 Esophageal varices without bleeding: Secondary | ICD-10-CM

## 2019-12-22 NOTE — Progress Notes (Signed)
P  Chief Complaint:    Cirrhosis follow-up  GI History: 39 year old male with decompensated alcoholic cirrhosis complicated by ascites, SBP, hepatic encephalopathy, coagulopathy, alcoholic hepatitis, varices.Was admitted in 04/2018 with sepsis/bacteremia (Rothia mucilaginosa), superimposed alcoholic hepatitis, ascites, SBP. Completed 14-day course of IV antibiotics and transition to ciprofloxacin for SBP prophylaxis (paracentesis negative for SBP, but prophylaxis recommended by ID given bacteremia with enteric organism).  Cirrhosis Evaluation: -Etiology: Alcohol -Complications: Hepatic encephalopathy, ascites, SBP, coagulopathy, varices -HCC screening:  -11/2018: AFP normal (3.1) -04/30/2018, TG:GYIRSWNIO appearing liver, 2 scattered subcentimeter hypodensities in the right liver lobe -08/04/2018, MRI three-phase: Several small hepatic cysts, 1.1 x 0.9 cm indeterminate focus of arterial enhancement in segment 2 without capsule or washout appearance (LI-RADS LR 3) -12/08/2018, MRI three-phase: LR 3 lesion no longer seen, mild arterial phase enhancement in the dome of the right hepatic lobe favoring transient hepatic attenuation without underlying lesion. Prominent GB wall thickening (likely 2/2hypoalbuminemia). Trace ascites, cirrhosis with portal hypertension, right gastric varices, small uphill paraesophageal varices, splenomegaly         -07/16/19 RUQ [place]: Cirrhotic liver, no lesions/HCC. E/o portal HTN -Variceal surveillance: Needs repeat EGD for EV banding -Serologic evaluation: Completed 04/2018. No concomitant injury/disease -Viral hepatitis vaccination: Started hepatitis B series. Immunity to HAV -Flu vaccine: 2020 -Pneumonia vaccine: 2020 -Liver biopsy: None -Cirrhosis medications:Lasix 40 mg, Aldactone 100 mg, ciprofloxacin 250 mg weekly, lactulose, potassium -MELD:25 --> 14 -Child Pugh score: C (10pts)  - EGD (08/10/19): Grade 2  EV with red wale- 2 bands placed. Mild PHG. Repeat 3-8 weeks. No BB due to hx of SBP   HPI:     Patient is a 39 y.o. male presenting to the Gastroenterology Clinic for follow-up. Currently w/p any complaints. Still sober.   Was seen in the [facility] on 12/16/2019.  Provided with work in Spanish to assist in applying for [employer], but he still hasnt filled out paperwork for insurance.  Otherwise, this is the main barrier to liver transplant.  He does have a "orange card", and is looking to resume through his PCM before expiration next month.  Plan for follow-up in the Hematology clinic once he has secured insurance.  Will need MRI and AFP in fall 2021 for ongoing hepatoma screening.   History obtained via interpreter present in the exam room.   Review of systems:     No chest pain, no SOB, no fevers, no urinary sx   Past Medical History:  Diagnosis Date  . Alcoholic cirrhosis of liver (HCC)   . Anemia   . Ankle fracture    Right  . GERD (gastroesophageal reflux disease)   . Hypertension   . Nocturia   . Thrombocytopenia (HCC)     Patient's surgical history, family medical history, social history, medications and allergies were all reviewed in Epic    Current Outpatient Medications  Medication Sig Dispense Refill  . acidophilus (RISAQUAD) CAPS capsule Take 1 capsule by mouth daily. 30 capsule 3  . baclofen (LIORESAL) 10 MG tablet Take 1 tablet (10 mg total) by mouth 3 (three) times daily as needed for muscle spasms. 20 each 0  . famotidine (PEPCID) 20 MG tablet Take 1 tablet (20 mg total) by mouth daily. 90 tablet 4  . folic acid (FOLVITE) 1 MG tablet TAKE 1 TABLET (1 MG TOTAL) BY MOUTH DAILY. 30 tablet 2  . furosemide (LASIX) 40 MG tablet TAKE 1 TABLET (40 MG TOTAL) BY MOUTH 2 (TWO) TIMES DAILY. 60 tablet 5  .  lactulose, encephalopathy, (CHRONULAC) 10 GM/15ML SOLN TAKE 30 ML BY MOUTH TWICE DAILY. 473 mL 1  . Multiple Vitamin (MULTIVITAMIN WITH MINERALS) TABS  tablet Take 1 tablet by mouth daily. 30 tablet 2  . ondansetron (ZOFRAN) 4 MG tablet TAKE 1 TABLET (4 MG TOTAL) BY MOUTH EVERY 6 (SIX) HOURS AS NEEDED FOR NAUSEA OR VOMITING. 30 tablet 1  . potassium chloride SA (KLOR-CON) 20 MEQ tablet TAKE 1 TABLET (20 MEQ TOTAL) BY MOUTH 2 (TWO) TIMES DAILY. 60 tablet 5  . spironolactone (ALDACTONE) 100 MG tablet TAKE 1 TABLET (100 MG TOTAL) BY MOUTH DAILY. 30 tablet 2  . thiamine 100 MG tablet Take 1 tablet (100 mg total) by mouth daily. 30 tablet 5   No current facility-administered medications for this visit.    Physical Exam:     BP 138/82   Pulse 83   Ht 5\' 5"  (1.651 m)   Wt 214 lb 8 oz (97.3 kg)   BMI 35.69 kg/m   GENERAL:  Pleasant male in NAD PSYCH: : Cooperative, normal affect EENT:  conjunctiva pink, mucous membranes moist, neck supple without masses CARDIAC:  RRR, no murmur heard, no peripheral edema PULM: Normal respiratory effort, lungs CTA bilaterally, no wheezing ABDOMEN:  Nondistended, soft, nontender. No obvious masses, no hepatomegaly,  normal bowel sounds SKIN:  turgor, no lesions seen Musculoskeletal:  Normal muscle tone, normal strength NEURO: Alert and oriented x 3, no focal neurologic deficits   IMPRESSION and PLAN:    1)Alcoholic Cirrhosis: Decompensated alcoholic cirrhosis with history of superimposed Alcoholic Hepatitisin 04/2018. Has since quit alcohol in 04/2018 without any recurrence.  Labs with some improvement in hepatic synthetic function.  MELD(14) and Child Pugh(C)  -Plan for repeat EGD at [facility] for EV surveillance/serial banding.  -Repeat labs in 37months.  -Continue follow-up with [facility] after able to secure insurance -We had a long discussion regarding his major barrier to OLT being his lack of insurance.    Discussed filling out appropriate paperwork that has been previously provided to him.  Can also discuss with his employer  Re: insurance benefits -Resume Lasix and Aldactone at  current dose -Resume lactulose -Resume ciprofloxacin for SBP prophylaxis per prior ID recommendations -Completedhepatitis B vaccine series -MRI liver in 01/2020 forongoing HCC screening -Resume low-sodium diet -Resume abstinence of all alcohol -Resume vitamin supplements  2)Hepatoma screening: 1.1 x 0.9 cm arterial enhancing lesion in segment 2 of the liver without capsule or typical washout.This was not seen on repeat MRI 11/2018.AFP normal at 3.1.  Subsequent RUQ 12/2018 unremarkable.  -Repeat MRI for ongoing HCC screening as above along with repeat AFP at that time  3) Ascites: Well-controlled on current diuretics  4) LE Edema: Well-controlled with diuretics and low-sodium diet. Resume current therapy.  5) Alcohol abuse disorder: Has been alcohol free since hospital discharge in 04/2018.Again applauded his efforts and continued desire to maintain complete abstinence of alcohol and encouraged him that this is undoubtedly the most important thing he can do for his overall health and improved morbidity/mortality.  6) Portal hypertension: Treatment as above  7) Hepatic encephalopathy: -Well-controlled on lactulose  8) Esophageal varices: -EGD in 07/2019 with grade 2 esophageal varices with red wheal sign.  Responded to band ligation.  Unable to tolerate beta-blocker due to history of SBP. -Schedule repeat upper endoscopy with serial banding for eradication     The indications, risks, and benefits of EGD with repeat banding were explained to the patient in detail. Risks include but are not  limited to bleeding, perforation, adverse reaction to medications, and cardiopulmonary compromise. Sequelae include but are not limited to the possibility of surgery, hositalization, and mortality.  Elevated risks due to underlying cirrhosis and possibility for esophageal variceal banding.  The patient verbalized understanding and wished to proceed. All questions answered, referred to  scheduler. Further recommendations pending results of the exam.            Shellia Cleverly ,DO, FACG 12/22/2019, 12:06 PM

## 2019-12-22 NOTE — Patient Instructions (Addendum)
If you are age 37 or older, your body mass index should be between 23-30. Your Body mass index is 35.69 kg/m. If this is out of the aforementioned range listed, please consider follow up with your Primary Care Provider.  If you are age 10 or younger, your body mass index should be between 19-25. Your Body mass index is 35.69 kg/m. If this is out of the aformentioned range listed, please consider follow up with your Primary Care Provider.   Your provider has requested that you go to the basement level for lab work at [address] [place], [place], [place] before today. Press "B" on the elevator. The lab is located at the first door on the left as you exit the elevator. Go for labs in TWO MONTHS  You have been scheduled for an endoscopy. Please follow written instructions given to you at your visit today. If you use inhalers (even only as needed), please bring them with you on the day of your procedure.   You need to have an MRI in October.  Please call scheduling to make this appointment. [phone]  The order has been placed.  Low sodium diet given   It was a pleasure to see you today!  Doristine Locks, D.O.    Plan de alimentacin con bajo contenido de sodio Low-Sodium Eating Plan [place], que es uno de los elementos que constituyen la sal, ayuda a Pharmacologist un equilibrio saludable de los fluidos corporales. Mucha cantidad de sodio puede aumentar la presin arterial y Radio producer que el organismo retenga lquidos y residuos. El mdico o el nutricionista podran recomendarle que siga este plan si tiene presin arterial alta (hipertensin), enfermedad renal, enfermedad heptica o insuficiencia cardaca. El consumo de una menor cantidad de sodio puede ayudar a [employer] presin arterial, reducir la hinchazn y Physicist, medical, el hgado y los riones. Consejos para seguir [employer] plan [place] generales  La mayora de las personas que sigan este plan deben limitar la ingesta de sodio a entre  1500y2000mg (miligramos) por Futures trader. Lectura de las etiquetas de los alimentos   La etiqueta de informacin nutricional indica la cantidad de sodio en una porcin de alimento. Si come ms de una porcin, debe multiplicar la cantidad indicada de sodio por la cantidad de porciones.  Elija alimentos con menos de 140mg  de sodio por porcin.  Evite consumir alimentos que tengan 300mg  de sodio o ms por porcin. De compras  Busque productos con bajo contenido de sodio, en cuyas etiquetas suele indicarse "bajo contenido de sodio" o "sin agregado de sal".  Siempre controle el contenido de sodio, incluso si en la etiqueta de los alimentos dice "sin sal" o "sin agregado de sal".  Compre alimentos frescos. ? Evite los alimentos enlatados y comidas precocidas o congeladas. ? Evite las carnes enlatadas, curadas o procesadas.  Compre panes que tengan menos de 80mg  de sodio por . Coccin  Consuma ms comida casera y menos de restaurante, de bufs y comida rpida.  Evite agregar sal cuando cocine. Use hierbas o aderezos sin sal, en lugar de sal de mesa o sal marina. Consulte al mdico o farmacutico antes de usar sustitutos de la sal.  Cocine con aceites de origen vegetal, como el de canola, girasol u [place]. Planificacin de las comidas  Cuando coma en un restaurante, pida que preparen su comida con menos sal o, en lo posible, sin nada de sal.  Evite consumir alimentos que contengan GMS (glutamato monosdico). El GMS suele agregarse a la comida  [place], al consom y a algunos alimentos enlatados. Qu alimentos se recomiendan? Los alimentos enumerados a continuacin no constituyen Water quality scientist. Hable con el nutricionista sobre las mejores opciones alimenticias para usted. Cereales Cereales con bajo contenido de sodio, como [place], arroz y trigo inflados, y trigo triturado. Galletas con bajo contenido de [place]. Arroz sin sal. Pastas sin sal. Pan con bajo contenido de sodio. Panes y  pastas integrales. Verduras Verduras frescas o congeladas. Verduras enlatadas "sin agregado de sal". Salsa de tomate y pastas "sin agregado de sal". Jugos de tomate y verduras con bajo contenido de sodio o reducidos en sodio. Nils Pyle Frutas frescas, congeladas o enlatadas. Jugo de frutas. Carnes y otros alimentos proteicos Carne de vaca o ave, pescado y mariscos frescos o congelados (sin agregado de sal). Atn y salmn enlatado con bajo contenido de [place]. Frutos secos sin sal. Lentejas, frijoles y guisantes secos, sin agregado de sal. Frijoles enlatados sin sal. Huevos. Mantequillas de frutos secos sin sal. Lcteos Leche. Leche de soja. Quesos que, por naturaleza, tengan bajo contenido de sodio, por ejemplo, la ricota, la mozzarella fresca o el queso suizo; o quesos con bajo contenido de sodio o reducidos en sodio. Queso crema. Yogur. Grasas y aceites [place] sin sal. Margarina sin sal ni grasas trans. Aceites vegetales, como de canola u oliva. Condimentos y otros alimentos Hierbas y especias frescas y secas. Aderezos sin sal. Mostaza y ktchup con bajo contenido de [place]. Aderezos para ensalada sin sodio. [place] light sin sodio. Rbano picante fresco o refrigerado. Jugo de limn. [place]. [place]opas caseras o con contenido de sodio bajo o reducido. Palomitas de maz y pretzels sin sal. Papas fritas sin sal o con bajo contenido de sal. Qu alimentos no se recomiendan? Los alimentos enumerados a continuacin no constituyen Water quality scientist. Hable con el nutricionista sobre las mejores opciones alimenticias para usted. Cereales Cereales instantneos para comer caliente. Mezclas para bizcochos, panqueques y rellenos de pan. Crutones. Mezclas para pastas o arroz con condimento. Envases comerciales de sopa de fideos. Macarrones con queso envasados o congelados. Galletas saladas comunes. Harina leudante. Verduras [place], verduras en escabeche y salsas. Aceitunas. Papas fritas. Anillos de cebollas.  Verduras enlatadas regulares (que no sean con bajo contenido de sodio o reducidas en sodio). Pasta y salsa de tomates enlatadas regulares (que no sean con bajo contenido de sodio o reducidas en sodio). Jugos de tomate y verduras regulares (que no sean con bajo contenido de sodio o reducidos en sodio). Verduras Hydrologist. Carnes y otros alimentos proteicos Carne de vaca o pescado que est [place], [place], [place], condimentada o en escabeche. [place], jamn, salchichas, hotdogs, carne en conserva, carne ahumada, embutidos envasados, carne de cerdo salada, cecina, arenques en escabeche, anchoas, atn enlatado comn, sardinas, nueces saladas. Lcteos Quesos para untar y quesos procesados. Requesn. Queso azul. Queso feta. Queso en hebras. Queso cottage comn. Suero de [place]. Leche enlatada. Grasas y aceites Mantequilla con sal. Margarina comn. Mantequilla clarificada. Grasa de panceta. Condimentos y otros alimentos Sal de cebolla, sal de ajo, sal condimentada, sal de mesa y sal marina. Salsas en lata y envasadas. Salsa [place]. Salsa trtara. Salsa barbacoa. Salsa teriyaki. Salsa de soja, incluso la que tiene contenido reducido de [place]. Salsa de carne. Salsa de pescado. Salsa de [place]. Salsa rosada. Rbano picante envasado. [place] y [employer]. Saborizantes y tiernizantes para carne. Caldo en cubitos. Salsa picante y salsa tabasco. Escabeches envasados o ya preparados. Aderezos para tacos prefabricados o envasados. Salsas. Aderezos comunes para ensalada. Salsa. Nachos y  papas fritas envasadas. Maz inflado y frituras de maz. Palomitas de maz y pretzels con sal. Sopas enlatadas o en polvo. Pizza. Pasteles y entradas congeladas. Resumen  El consumo de una menor cantidad de sodio puede ayudar a [employer] presin arterial, reducir la hinchazn y Physicist, medical, el hgado y los riones.  La [employer] de las personas que sigan este plan deben limitar la ingesta de sodio a entre  1500y2000mg (miligramos) por Futures trader.  Los [employer], envasados y [address] tienen alto contenido de [place]. La pizza, la comida rpida y la comida de los restaurantes tambin contienen mucho sodio. Tambin aporta sodio al agregar sal a las comidas.  Intente cocinar en su hogar, coma ms frutas y verduras frescas, y coma menos comidas rpidas y alimentos enlatados, procesados o preparados. Esta informacin no tiene Theme park manager el consejo del mdico. Asegrese de hacerle al mdico cualquier pregunta que tenga. Document Revised: 08/23/2016 Document Reviewed: 08/23/2016 Elsevier Patient Education  2020 [employer].

## 2020-01-14 ENCOUNTER — Other Ambulatory Visit: Payer: Self-pay | Admitting: Internal Medicine

## 2020-01-14 DIAGNOSIS — K7031 Alcoholic cirrhosis of liver with ascites: Secondary | ICD-10-CM

## 2020-01-14 MED FILL — POTASSIUM CL ER 20 MEQ TAB: 20 | 30 days supply | Qty: 60 | Fill #3

## 2020-01-14 MED FILL — LACTULOSE 10 GM/15 ML SOLN: 10 | 7 days supply | Qty: 473 | Fill #0

## 2020-01-14 MED FILL — FOLIC ACID 1 MG TABS: 1 | 30 days supply | Qty: 30 | Fill #2

## 2020-01-14 MED FILL — FAMOTIDINE 20 MG TABS: 20 | 30 days supply | Qty: 30 | Fill #1

## 2020-01-14 MED FILL — ?SPIRONOLACTONE 100MG TAB: 100 | 30 days supply | Qty: 30 | Fill #1

## 2020-01-14 NOTE — Telephone Encounter (Signed)
Requested medication (s) are due for refill today: yes  Requested medication (s) are on the active medication list: {yes  Last refill: 11/27/19  #30  1 refill  Future visit scheduled no  Notes to clinic:not delegated  Requested Prescriptions  Pending Prescriptions Disp Refills   ondansetron (ZOFRAN) 4 MG tablet [Pharmacy Med Name: ONDANSETRON HCL 4 MG TABLET 4 Tablet] 30 tablet 1    Sig: TAKE 1 TABLET (4 MG TOTAL) BY MOUTH EVERY 6 (SIX) HOURS AS NEEDED FOR NAUSEA OR VOMITING.      Not Delegated - Gastroenterology: Antiemetics Failed - 01/14/2020  4:07 PM      Failed - This refill cannot be delegated      Passed - Valid encounter within last 6 months    Recent Outpatient Visits           4 months ago Decompensated hepatic cirrhosis [facility])   [facility] Marcine Matar, MD   6 months ago Ankle fracture, left, sequela   [facility] Jonah Blue B, MD   8 months ago Nonintractable headache, unspecified chronicity pattern, unspecified headache type   [facility] [place], [place], [place]   10 months ago Alcoholic cirrhosis of liver with ascites [facility])   [facility] Cain Saupe, MD   1 year ago Epistaxis   [facility] Marcine Matar, MD

## 2020-01-15 MED FILL — ONDANSETRON HCL 4 MG TABLET: 4 | 7 days supply | Qty: 30 | Fill #0

## 2020-01-29 ENCOUNTER — Telehealth: Payer: Self-pay | Admitting: Gastroenterology

## 2020-01-29 NOTE — Telephone Encounter (Signed)
Covid screening on 02/04/20 at 8 am.  Letter mailed to patient.

## 2020-01-29 NOTE — Telephone Encounter (Signed)
Pt informed

## 2020-01-29 NOTE — Telephone Encounter (Signed)
Pt is scheduled for EGD with Dr. Salena Saner on 10/11 and needs to know if he needs to schedule a Covid test. Pls advise.

## 2020-02-04 ENCOUNTER — Other Ambulatory Visit (HOSPITAL_COMMUNITY)
Admission: RE | Admit: 2020-02-04 | Discharge: 2020-02-04 | Disposition: A | Discharge status: Home / Self Care | Payer: HRSA Program | Source: Ambulatory Visit | Attending: Gastroenterology | Admitting: Gastroenterology

## 2020-02-04 DIAGNOSIS — Z01818 Encounter for other preprocedural examination: Secondary | ICD-10-CM | POA: Diagnosis present

## 2020-02-04 DIAGNOSIS — Z20822 Contact with and (suspected) exposure to covid-19: Secondary | ICD-10-CM | POA: Diagnosis not present

## 2020-02-04 LAB — SARS CORONAVIRUS 2 (TAT 6-24 HRS): SARS Coronavirus 2: NEGATIVE

## 2020-02-08 ENCOUNTER — Ambulatory Visit (HOSPITAL_COMMUNITY): Payer: Self-pay | Admitting: Certified Registered Nurse Anesthetist

## 2020-02-08 ENCOUNTER — Encounter (HOSPITAL_COMMUNITY)
Admission: RE | Disposition: A | Discharge status: Home / Self Care | Payer: Self-pay | Source: Home / Self Care | Attending: Gastroenterology

## 2020-02-08 ENCOUNTER — Encounter (HOSPITAL_COMMUNITY): Payer: Self-pay | Admitting: Gastroenterology

## 2020-02-08 ENCOUNTER — Other Ambulatory Visit: Payer: Self-pay

## 2020-02-08 ENCOUNTER — Ambulatory Visit (HOSPITAL_COMMUNITY)
Admission: RE | Admit: 2020-02-08 | Discharge: 2020-02-08 | Disposition: A | Discharge status: Home / Self Care | Payer: Self-pay | Attending: Gastroenterology | Admitting: Gastroenterology

## 2020-02-08 DIAGNOSIS — K851 Biliary acute pancreatitis without necrosis or infection: Secondary | ICD-10-CM

## 2020-02-08 DIAGNOSIS — K2950 Unspecified chronic gastritis without bleeding: Secondary | ICD-10-CM | POA: Insufficient documentation

## 2020-02-08 DIAGNOSIS — K7031 Alcoholic cirrhosis of liver with ascites: Secondary | ICD-10-CM | POA: Insufficient documentation

## 2020-02-08 DIAGNOSIS — K2289 Other specified disease of esophagus: Secondary | ICD-10-CM | POA: Insufficient documentation

## 2020-02-08 DIAGNOSIS — I10 Essential (primary) hypertension: Secondary | ICD-10-CM | POA: Insufficient documentation

## 2020-02-08 DIAGNOSIS — Z8719 Personal history of other diseases of the digestive system: Secondary | ICD-10-CM | POA: Insufficient documentation

## 2020-02-08 DIAGNOSIS — K7030 Alcoholic cirrhosis of liver without ascites: Secondary | ICD-10-CM

## 2020-02-08 DIAGNOSIS — Z09 Encounter for follow-up examination after completed treatment for conditions other than malignant neoplasm: Secondary | ICD-10-CM | POA: Insufficient documentation

## 2020-02-08 DIAGNOSIS — Z87891 Personal history of nicotine dependence: Secondary | ICD-10-CM | POA: Insufficient documentation

## 2020-02-08 DIAGNOSIS — K2970 Gastritis, unspecified, without bleeding: Secondary | ICD-10-CM

## 2020-02-08 DIAGNOSIS — F1010 Alcohol abuse, uncomplicated: Secondary | ICD-10-CM | POA: Insufficient documentation

## 2020-02-08 DIAGNOSIS — K7290 Hepatic failure, unspecified without coma: Secondary | ICD-10-CM | POA: Insufficient documentation

## 2020-02-08 DIAGNOSIS — K766 Portal hypertension: Secondary | ICD-10-CM | POA: Insufficient documentation

## 2020-02-08 DIAGNOSIS — K7460 Unspecified cirrhosis of liver: Secondary | ICD-10-CM

## 2020-02-08 DIAGNOSIS — I8500 Esophageal varices without bleeding: Secondary | ICD-10-CM

## 2020-02-08 HISTORY — PX: ESOPHAGOGASTRODUODENOSCOPY (EGD) WITH PROPOFOL: SHX5813

## 2020-02-08 HISTORY — PX: BIOPSY: SHX5522

## 2020-02-08 SURGERY — ESOPHAGOGASTRODUODENOSCOPY (EGD) WITH PROPOFOL
Anesthesia: Monitor Anesthesia Care

## 2020-02-08 MED ORDER — PROPOFOL 10 MG/ML IV BOLUS
INTRAVENOUS | Status: DC | PRN
  Administered 2020-02-08 (×2): 40 mg via INTRAVENOUS

## 2020-02-08 MED ORDER — LIDOCAINE HCL (CARDIAC) PF 100 MG/5ML IV SOSY
PREFILLED_SYRINGE | INTRAVENOUS | Status: DC | PRN
  Administered 2020-02-08: 100 mg via INTRAVENOUS

## 2020-02-08 MED ORDER — SODIUM CHLORIDE 0.9 % IV SOLN: INTRAVENOUS | Status: DC

## 2020-02-08 MED ORDER — LACTATED RINGERS IV SOLN: INTRAVENOUS | Status: DC

## 2020-02-08 MED ORDER — PROPOFOL 500 MG/50ML IV EMUL
INTRAVENOUS | Status: DC | PRN
  Administered 2020-02-08: 170 ug/kg/min via INTRAVENOUS

## 2020-02-08 SURGICAL SUPPLY — 14 items

## 2020-02-08 NOTE — Discharge Instructions (Signed)
YOU HAD AN ENDOSCOPIC PROCEDURE TODAY: Refer to the procedure report and other information in the discharge instructions given to you for any specific questions about what was found during the examination. If this information does not answer your questions, please call [place] office at [phone] to clarify.   YOU SHOULD EXPECT: Some feelings of bloating in the abdomen. Passage of more gas than usual. Walking can help get rid of the air that was put into your GI tract during the procedure and reduce the bloating.  DIET: Your first meal following the procedure should be a light meal and then it is ok to progress to your normal diet. A half-sandwich or bowl of soup is an example of a good first meal. Heavy or fried foods are harder to digest and may make you feel nauseous or bloated. Drink plenty of fluids but you should avoid alcoholic beverages for 24 hours. .    ACTIVITY: Your care partner should take you home directly after the procedure. You should plan to take it easy, moving slowly for the rest of the day. You can resume normal activity the day after the procedure however YOU SHOULD NOT DRIVE, use power tools, machinery or perform tasks that involve climbing or major physical exertion for 24 hours (because of the sedation medicines used during the test).   SYMPTOMS TO REPORT IMMEDIATELY: A gastroenterologist can be reached at any hour. Please call [phone]  for any of the following symptoms:   . Following upper endoscopy (EGD, EUS, ERCP, esophageal dilation) Vomiting of blood or coffee ground material  New, significant abdominal pain  New, significant chest pain or pain under the shoulder blades  Painful or persistently difficult swallowing  New shortness of breath  Black, tarry-looking or red, bloody stools  FOLLOW UP:  If any biopsies were taken you will be contacted by phone or by letter within the next 1-3 weeks. Call [phone]  if you have not heard about the biopsies in 3  weeks.  Please also call with any specific questions about appointments or follow up tests.  USTED TUVO UN PROCEDIMIENTO ENDOSCPICO HOY EN [facility]:   Lea el informe del procedimiento que se le entreg para cualquier pregunta especfica sobre lo que se Dentist.  Si el informe del examen no responde a sus preguntas, por favor llame a su gastroenterlogo para aclararlo.  Si usted solicit que no se le den [employer] de lo que se Clinical cytogeneticist en su procedimiento al [employer] va a cuidar, entonces el informe del procedimiento se ha incluido en un sobre sellado para que usted lo revise despus cuando le sea ms conveniente.   LO QUE PUEDE ESPERAR: Algunas sensaciones de hinchazn en el abdomen.  Puede tener ms gases de lo normal.  El caminar puede ayudarle a eliminar el aire que se le puso en el tracto gastrointestinal durante el procedimiento y reducir la hinchazn.    Tenga en cuenta:  Es posible que note un poco de irritacin y congestin en la nariz o algn drenaje.  Esto es debido al oxgeno [employer] durante su procedimiento.  No hay que preocuparse y esto debe desaparecer ms o Regulatory affairs officer.    Despus de la endoscopia superior (EGD)  Vmitos de Retail buyer o material como caf molido   Dolor en el pecho o dolor debajo de los omplatos que antes no tena   Dolor o dificultad persistente para tragar  Falta de aire que antes no tena  Fiebre de 100F o ms  Heces fecales negras y pegajosas   Para asuntos urgentes o de Associate Professor, puede comunicarse con un gastroenterlogo a cualquier hora llamando al [phone].  DIETA:  Recomendamos una comida pequea al principio, pero luego puede continuar con su dieta normal.  Tome muchos lquidos, Tax adviser las bebidas alcohlicas durante 24 horas.    ACTIVIDAD:  Debe planear tomarse las cosas con calma por el resto del da y no debe CONDUCIR ni usar maquinaria pesada Patent examiner (debido a los  medicamentos de sedacin utilizados durante el examen).     SEGUIMIENTO: Nuestro personal llamar al nmero que aparece en su historial al siguiente da hbil de su procedimiento para ver cmo se siente y para responder cualquier pregunta o inquietud que pueda tener con respecto a la informacin que se le dio despus del procedimiento. Si no podemos contactarle, le dejaremos un mensaje.  Sin embargo, si se siente bien y no tiene English as a second language teacher, no es necesario que nos devuelva la llamada.  Asumiremos que ha regresado a sus actividades diarias normales sin incidentes. Si se le tomaron algunas biopsias, le contactaremos por telfono o por carta en las prximas 3 semanas.  Si no ha sabido [employer] biopsias en el transcurso de 3 semanas, por favor llmenos al [phone].   FIRMAS/CONFIDENCIALIDAD: Usted y/o el acompaante que le cuide han firmado documentos que se ingresarn en su historial mdico electrnico.  Estas firmas atestiguan el hecho de que la informacin anterior

## 2020-02-08 NOTE — H&P (Signed)
P  Chief Complaint:    Esophageal varices surveillance, cirrhosis  GI History: 39 year old male with decompensated alcoholic cirrhosis complicated by ascites, SBP, hepatic encephalopathy, coagulopathy, alcoholic hepatitis, varices.Was admitted in 04/2018 with sepsis/bacteremia (Rothia mucilaginosa), superimposed alcoholic hepatitis, ascites, SBP. Completed 14-day course of IV antibiotics and transition to ciprofloxacin for SBP prophylaxis (paracentesis negative for SBP, but prophylaxis recommended by ID given bacteremia with enteric organism).  Cirrhosis Evaluation: -Etiology: Alcohol -Complications: Hepatic encephalopathy, ascites, SBP, coagulopathy, varices -HCC screening:  -11/2018: AFP normal (3.1) -04/30/2018, XH:BZJIRCVEL appearing liver, 2 scattered subcentimeter hypodensities in the right liver lobe -08/04/2018, MRI three-phase: Several small hepatic cysts, 1.1 x 0.9 cm indeterminate focus of arterial enhancement in segment 2 without capsule or washout appearance (LI-RADS LR 3) -12/08/2018, MRI three-phase: LR 3 lesion no longer seen, mild arterial phase enhancement in the dome of the right hepatic lobe favoring transient hepatic attenuation without underlying lesion. Prominent GB wall thickening (likely 2/2hypoalbuminemia). Trace ascites, cirrhosis with portal hypertension, right gastric varices, small uphill paraesophageal varices, splenomegaly         -07/16/19 RUQ [place]: Cirrhotic liver, no lesions/HCC. E/o portal HTN -Variceal surveillance: Needs repeat EGD for EV banding scheduled today -Serologic evaluation: Completed 04/2018. No concomitant injury/disease -Viral hepatitis vaccination: Started hepatitis B series. Immunity to HAV -Flu vaccine: 2020 -Pneumonia vaccine: 2020 -Liver biopsy: None -Cirrhosis medications:Lasix 40 mg, Aldactone 100 mg, ciprofloxacin 250 mg weekly, lactulose, potassium -MELD:25-->14 -Child Pugh score: C  (10pts)  - EGD (08/10/19): Grade 2 EV with red wale- 2 bands placed. Mild PHG. Repeat 3-8 weeks. No BB due to hx of SBP   HPI:     Patient is a 39 y.o. male presenting to [facility] for EV surveillance and repeat banding as needed. Unable to tx with BB due to hx of SBP.    Review of systems:     No chest pain, no SOB, no fevers, no urinary sx   Past Medical History:  Diagnosis Date  . Alcoholic cirrhosis of liver (HCC)   . Anemia   . Ankle fracture    Right  . GERD (gastroesophageal reflux disease)   . Hypertension   . Nocturia   . Thrombocytopenia (HCC)     Patient's surgical history, family medical history, social history, medications and allergies were all reviewed in Epic    No current facility-administered medications for this encounter.    Physical Exam:     There were no vitals taken for this visit.  GENERAL:  Pleasant male in NAD PSYCH: : Cooperative, normal affect EENT:  conjunctiva pink, mucous membranes moist, neck supple without masses CARDIAC:  RRR, no murmur heard, no peripheral edema PULM: Normal respiratory effort, lungs CTA bilaterally, no wheezing ABDOMEN:  Nondistended, soft, nontender. No obvious masses, no hepatomegaly,  normal bowel sounds SKIN:  turgor, no lesions seen Musculoskeletal:  Normal muscle tone, normal strength NEURO: Alert and oriented x 3, no focal neurologic deficits   IMPRESSION and PLAN:    1) Alcoholic Cirrhosis: Decompensated alcoholic cirrhosis with history of superimposed Alcoholic Hepatitisin 04/2018. Has since quit alcohol in 04/2018 without any recurrence.Labs with some improvement in hepatic synthetic function.  MELD(14) and Child Pugh(C)  -Plan for repeat EGD today for EV surveillance/serial banding.  -Repeat labs and HCC screening to be scheduled -Continue follow-up with [facility] after able to secure insurance -Resume Lasix and Aldactone at current dose -Resume lactulose -Resume  ciprofloxacin for SBP prophylaxis per prior ID recommendations -Completedhepatitis B vaccine series -Will  need to schedule MRI liver forongoing HCC screening -Resume low-sodium diet -Resume abstinence of all alcohol -Resume vitamin supplements  2)Hepatoma screening: 1.1 x 0.9 cm arterial enhancing lesion in segment 2 of the liver without capsule or typical washout.This was not seen on repeat MRI8/2020.AFP normal at 3.1.  Subsequent RUQ US unremarkable. -Repeat MRI for ongoing HCC screening as above along with repeat AFP this month  3) Ascites: Well-controlled on current diuretics  4) LE Edema: Well-controlledwith diuretics and low-sodium diet. Resume current therapy.  5) Alcohol abuse disorder: Has been alcohol free since hospital discharge in 04/2018.  6) Portal hypertension: Treatment as above  7) Hepatic encephalopathy: -Well-controlled on lactulose  8) Esophageal varices: -EGD in 07/2019 with grade 2 esophageal varices with red wheal sign.  Responded to band ligation.  Unable to tolerate beta-blocker due to history of SBP. -Repeat upper endoscopy with serial banding for eradication today          Shellia Cleverly ,DO, FACG 02/08/2020, 11:41 AM

## 2020-02-08 NOTE — Anesthesia Preprocedure Evaluation (Signed)
Anesthesia Evaluation  Patient identified by MRN, date of birth, ID band Patient awake    Reviewed: Allergy & Precautions, NPO status , Patient's Chart, lab work & pertinent test results  Airway Mallampati: III  TM Distance: >3 FB Neck ROM: Full    Dental  (+) Teeth Intact, Dental Advisory Given   Pulmonary former smoker,    Pulmonary exam normal        Cardiovascular hypertension, Pt. on medications  Rhythm:Regular Rate:Normal     Neuro/Psych PSYCHIATRIC DISORDERS negative neurological ROS     GI/Hepatic GERD  Medicated,(+) Cirrhosis   Esophageal Varices    ,   Endo/Other  negative endocrine ROS  Renal/GU negative Renal ROS     Musculoskeletal negative musculoskeletal ROS (+)   Abdominal (+) + obese,   Peds  Hematology   Anesthesia Other Findings   Reproductive/Obstetrics                             Anesthesia Physical Anesthesia Plan  ASA: III  Anesthesia Plan: MAC   Post-op Pain Management:    Induction: Intravenous  PONV Risk Score and Plan: 0 and Propofol infusion  Airway Management Planned: Natural Airway and Simple Face Mask  Additional Equipment: None  Intra-op Plan:   Post-operative Plan:   Informed Consent: I have reviewed the patients History and Physical, chart, labs and discussed the procedure including the risks, benefits and alternatives for the proposed anesthesia with the patient or authorized representative who has indicated his/her understanding and acceptance.       Plan Discussed with: CRNA  Anesthesia Plan Comments: (Lab Results      Component                Value               Date                      WBC                      5.3                 09/10/2019                HGB                      13.5                09/10/2019                HCT                      37.9                09/10/2019                MCV                      94                   09/10/2019                PLT                      43 (LL)             09/10/2019  Covid-19 Nucleic Acid Test Results Lab Results      Component                Value               Date                      SARSCOV2NAA              NEGATIVE            02/04/2020                SARSCOV2NAA              NEGATIVE            08/06/2019                [place]              NEGATIVE            06/05/2019           )        Anesthesia Quick Evaluation

## 2020-02-08 NOTE — Transfer of Care (Signed)
Immediate Anesthesia Transfer of Care Note  Patient: Jason [name]  Procedure(s) Performed: ESOPHAGOGASTRODUODENOSCOPY (EGD) WITH PROPOFOL (N/A ) BIOPSY  Patient Location: PACU  Anesthesia Type:MAC  Level of Consciousness: awake, alert  and oriented  Airway & Oxygen Therapy: Patient Spontanous Breathing and Patient connected to face mask oxygen  Post-op Assessment: Report given to RN and Post -op Vital signs reviewed and stable  Post vital signs: Reviewed and stable  Last Vitals:  Vitals Value Taken Time  BP    Temp    Pulse    Resp    SpO2      Last Pain:  Vitals:   02/08/20 1206  TempSrc: Oral  PainSc: 0-No pain         Complications: No complications documented.

## 2020-02-08 NOTE — Anesthesia Postprocedure Evaluation (Signed)
Anesthesia Post Note  Patient: Jason [name]  Procedure(s) Performed: ESOPHAGOGASTRODUODENOSCOPY (EGD) WITH PROPOFOL (N/A ) BIOPSY     Patient location during evaluation: PACU Anesthesia Type: MAC Level of consciousness: awake and alert Pain management: pain level controlled Vital Signs Assessment: post-procedure vital signs reviewed and stable Respiratory status: spontaneous breathing, nonlabored ventilation, respiratory function stable and patient connected to nasal cannula oxygen Cardiovascular status: stable and blood pressure returned to baseline Postop Assessment: no apparent nausea or vomiting Anesthetic complications: no   No complications documented.  Last Vitals:  Vitals:   02/08/20 1250 02/08/20 1300  BP: (!) 142/62 (!) 141/55  Pulse: 71 71  Resp: 14 20  Temp:    SpO2: 100% 100%    Last Pain:  Vitals:   02/08/20 1300  TempSrc:   PainSc: 0-No pain                 Effie Berkshire

## 2020-02-08 NOTE — Op Note (Signed)
[facility] Patient Name: Jason LittlesJose Arriola [name] Procedure Date: 02/08/2020 MRN: [id number] Attending MD: Doristine LocksVito Lucita Montoya , MD Date of Birth: 1980-08-19 CSN: [id number] Age: [age] Admit Type: Outpatient Procedure:                Upper GI endoscopy Indications:              Follow-up of esophageal varices. EGD on 08/10/19                            with Grade 2 esophageal varices, 1 with red wale,                            treated with variceal ligation with 2 bands placed.                            He presents today for ongoing surveillance. Providers:                Doristine LocksVito Derek Huneycutt, MD, Dwain SarnaPatricia Ford, RN, Lawson Radararlene                            Davis, Technician, Stephanie [place], CRNA Referring MD:              Medicines:                Monitored Anesthesia Care Complications:            No immediate complications. Estimated Blood Loss:     Estimated blood loss was minimal. Procedure:                Pre-Anesthesia Assessment:                           - Prior to the procedure, a History and Physical                            was performed, and patient medications and                            allergies were reviewed. The patient's tolerance of                            previous anesthesia was also reviewed. The risks                            and benefits of the procedure and the sedation                            options and risks were discussed with the patient.                            All questions were answered, and informed consent                            was obtained. Prior Anticoagulants: The patient has  taken no previous anticoagulant or antiplatelet                            agents. ASA Grade Assessment: III - A patient with                            severe systemic disease. After reviewing the risks                            and benefits, the patient was deemed in                            satisfactory condition to  undergo the procedure.                           After obtaining informed consent, the endoscope was                            passed under direct vision. Throughout the                            procedure, the patient's blood pressure, pulse, and                            oxygen saturations were monitored continuously. The                            GIF-H190 ([id number]) Olympus gastroscope was                            introduced through the mouth, and advanced to the                            second part of duodenum. The upper GI endoscopy was                            accomplished without difficulty. The patient                            tolerated the procedure well. Scope In: Scope Out: Findings:      A small post variceal banding scar was found in the lower third of the       esophagus. The scar tissue was healthy in appearance.      The examined esophagus was otherwise normal. The previously noted       esophageal varices were not present on this study.      Scattered mild inflammation characterized by erythema was found in the       gastric fundus and in the gastric body. Biopsies were taken with a cold       forceps for Helicobacter pylori testing. Estimated blood loss was       minimal.      The incisura, gastric antrum and pylorus were normal.      The examined duodenum was normal. Impression:               -  Scar in the lower third of the esophagus.                           - Normal esophagus.                           - Gastritis. Biopsied.                           - Normal incisura, antrum and pylorus.                           - Normal examined duodenum. Moderate Sedation:      Not Applicable - Patient had care per Anesthesia. Recommendation:           - Patient has a contact number available for                            emergencies. The signs and symptoms of potential                            delayed complications were discussed with the                             patient. Return to normal activities tomorrow.                            Written discharge instructions were provided to the                            patient.                           - Resume previous diet.                           - Continue present medications.                           - Await pathology results.                           - Repeat upper endoscopy in 1 year for surveillance.                           - Return to GI clinic as previously scheduled.                           - Proceed with MRI and labs as scheduled this month                            for ongoing HCC screening. Procedure Code(s):        --- Professional ---                           [phone], Esophagogastroduodenoscopy, flexible,  transoral; with biopsy, single or multiple Diagnosis Code(s):        --- Professional ---                           K22.8, Other specified diseases of esophagus                           K29.70, Gastritis, unspecified, without bleeding                           I85.00, Esophageal varices without bleeding CPT copyright 2019 American Medical Association. All rights reserved. The codes documented in this report are preliminary and upon coder review may  be revised to meet current compliance requirements. Doristine Locks, MD 02/08/2020 12:39:41 PM Number of Addenda: 0

## 2020-02-08 NOTE — Interval H&P Note (Signed)
History and Physical Interval Note:  02/08/2020 11:51 AM  Kiko Victorio Palm Mykell Rawl  has presented today for surgery, with the diagnosis of cirrhosis, esophageal varices.  The various methods of treatment have been discussed with the patient and family. After consideration of risks, benefits and other options for treatment, the patient has consented to  Procedure(s) with comments: ESOPHAGOGASTRODUODENOSCOPY (EGD) WITH PROPOFOL (N/A) - EGD with banding ESOPHAGEAL BANDING (N/A) as a surgical intervention.  The patient's history has been reviewed, patient examined, no change in status, stable for surgery.  I have reviewed the patient's chart and labs.  Questions were answered to the patient's satisfaction.     Verlin Dike Abbigayle Toole

## 2020-02-09 ENCOUNTER — Encounter: Payer: Self-pay | Admitting: Gastroenterology

## 2020-02-09 ENCOUNTER — Other Ambulatory Visit: Payer: Self-pay

## 2020-02-09 LAB — SURGICAL PATHOLOGY

## 2020-02-10 ENCOUNTER — Encounter (HOSPITAL_COMMUNITY): Payer: Self-pay | Admitting: Gastroenterology

## 2020-02-16 ENCOUNTER — Other Ambulatory Visit: Payer: Self-pay | Admitting: Gastroenterology

## 2020-02-16 DIAGNOSIS — K7031 Alcoholic cirrhosis of liver with ascites: Secondary | ICD-10-CM

## 2020-02-16 MED FILL — FUROSEMIDE 40 MG TAB: 40 | 30 days supply | Qty: 60 | Fill #3

## 2020-02-16 MED FILL — POTASSIUM CL ER 20 MEQ TAB: 20 | 30 days supply | Qty: 60 | Fill #4

## 2020-02-16 MED FILL — ?FAMOTIDINE 20MG TABLET: 20 | 30 days supply | Qty: 30 | Fill #2

## 2020-02-16 MED FILL — ONDANSETRON HCL 4 MG TABLET: 4 | 7 days supply | Qty: 30 | Fill #0

## 2020-02-16 MED FILL — LACTULOSE 10 GM/15 ML SOLN: 10 | 7 days supply | Qty: 473 | Fill #1

## 2020-02-16 MED FILL — ?SPIRONOLACTONE 100MG TAB: 100 | 30 days supply | Qty: 30 | Fill #2

## 2020-02-19 ENCOUNTER — Other Ambulatory Visit: Payer: Self-pay | Admitting: Gastroenterology

## 2020-02-22 MED FILL — FOLIC ACID 1 MG TABS: 1 | 30 days supply | Qty: 30 | Fill #0

## 2020-04-19 ENCOUNTER — Other Ambulatory Visit: Payer: Self-pay | Admitting: General Surgery

## 2020-04-19 ENCOUNTER — Telehealth: Payer: Self-pay | Admitting: Gastroenterology

## 2020-04-19 ENCOUNTER — Other Ambulatory Visit: Payer: Self-pay | Admitting: Gastroenterology

## 2020-04-19 DIAGNOSIS — K7031 Alcoholic cirrhosis of liver with ascites: Secondary | ICD-10-CM

## 2020-04-19 MED ORDER — FOLIC ACID 1 MG PO TABS: 1.0000 mg | ORAL_TABLET | Freq: Every day | ORAL | 2 refills | Status: DC

## 2020-04-19 MED ORDER — FAMOTIDINE 20 MG PO TABS
20.0000 mg | ORAL_TABLET | Freq: Every day | ORAL | 4 refills | Status: DC
  Filled 2020-08-25: qty 30, 30d supply, fill #0
  Filled 2020-10-07: qty 30, 30d supply, fill #1
  Filled 2020-11-10: qty 30, 30d supply, fill #2
  Filled 2020-12-13: qty 30, 30d supply, fill #3
  Filled 2021-01-23: qty 30, 30d supply, fill #4
  Filled 2021-02-22: qty 30, 30d supply, fill #5
  Filled 2021-04-03: qty 30, 30d supply, fill #6

## 2020-04-19 MED ORDER — LACTULOSE ENCEPHALOPATHY 10 GM/15ML PO SOLN: 20.0000 g | Freq: Two times a day (BID) | ORAL | 1 refills | Status: DC

## 2020-04-19 MED FILL — POTASSIUM CL ER 20 MEQ TAB: 20 | 30 days supply | Qty: 60 | Fill #5

## 2020-04-19 MED FILL — ?FUROSEMIDE 40 MG TABLET: 40 | 30 days supply | Qty: 60 | Fill #4

## 2020-04-19 MED FILL — FOLIC ACID 1 MG TABS: 1 | 30 days supply | Qty: 30 | Fill #0

## 2020-04-19 MED FILL — LACTULOSE 10 GM/15 ML SOLN: 10 | 30 days supply | Qty: 1800 | Fill #0

## 2020-04-19 MED FILL — ?FAMOTIDINE 20MG TABLET: 20 | 30 days supply | Qty: 30 | Fill #3

## 2020-04-19 NOTE — Telephone Encounter (Signed)
Patient requesting refill on Pepcid, Folic acid and Lactulose

## 2020-04-21 ENCOUNTER — Other Ambulatory Visit: Payer: Self-pay | Admitting: General Surgery

## 2020-04-21 DIAGNOSIS — K7031 Alcoholic cirrhosis of liver with ascites: Secondary | ICD-10-CM

## 2020-04-21 MED ORDER — SPIRONOLACTONE 100 MG PO TABS: 100.0000 mg | ORAL_TABLET | Freq: Every day | ORAL | 2 refills | Status: DC

## 2020-04-21 MED FILL — ?SPIRONOLACTONE 100MG TAB: 100 | 30 days supply | Qty: 30 | Fill #0

## 2020-05-03 ENCOUNTER — Other Ambulatory Visit: Payer: Self-pay | Admitting: Internal Medicine

## 2020-05-03 DIAGNOSIS — K7031 Alcoholic cirrhosis of liver with ascites: Secondary | ICD-10-CM

## 2020-05-03 NOTE — Telephone Encounter (Signed)
Requested medication (s) are due for refill today: yes  Requested medication (s) are on the active medication list: yes  Last refill:  01/14/20 #30 0 refills  Future visit scheduled: yes   Notes to clinic:  not delegated per protocol     Requested Prescriptions  Pending Prescriptions Disp Refills   ondansetron (ZOFRAN) 4 MG tablet [Pharmacy Med Name: ONDANSETRON HCL 4 MG TABLET 4 Tablet] 30 tablet 0    Sig: TAKE 1 TABLET (4 MG TOTAL) BY MOUTH EVERY 6 (SIX) HOURS AS NEEDED FOR NAUSEA OR VOMITING.      Not Delegated - Gastroenterology: Antiemetics Failed - 05/03/2020 12:51 PM      Failed - This refill cannot be delegated      Failed - Valid encounter within last 6 months    Recent Outpatient Visits           8 months ago Decompensated hepatic cirrhosis [facility])   [place] [facility] Marcine Matar, MD   10 months ago Ankle fracture, left, sequela   [facility] Marcine Matar, MD   12 months ago Nonintractable headache, unspecified chronicity pattern, unspecified headache type   [facility] [place], [place], [place]   1 year ago Alcoholic cirrhosis of liver with ascites [facility])   [facility] Cain Saupe, MD   1 year ago Epistaxis   [facility] Marcine Matar, MD       Future Appointments             In 1 month Laural Benes Binnie Rail, MD [facility]

## 2020-05-04 MED FILL — ONDANSETRON HCL 4 MG TABLET: 4 | 7 days supply | Qty: 30 | Fill #0

## 2020-05-23 ENCOUNTER — Other Ambulatory Visit: Payer: Self-pay | Admitting: Internal Medicine

## 2020-05-23 DIAGNOSIS — K7031 Alcoholic cirrhosis of liver with ascites: Secondary | ICD-10-CM

## 2020-05-23 MED FILL — ?FAMOTIDINE 20MG TABLET: 20 | 30 days supply | Qty: 30 | Fill #4

## 2020-05-23 MED FILL — ?FUROSEMIDE 40 MG TABLET: 40 | 30 days supply | Qty: 60 | Fill #5

## 2020-05-23 MED FILL — ONDANSETRON HCL 4 MG TABLET: 4 | 7 days supply | Qty: 30 | Fill #0

## 2020-05-23 MED FILL — FOLIC ACID 1 MG TABS: 1 | 30 days supply | Qty: 30 | Fill #1

## 2020-05-23 MED FILL — POTASSIUM CL ER 20 MEQ TAB: 20 | 30 days supply | Qty: 60 | Fill #0

## 2020-05-23 MED FILL — ?SPIRONOLACTONE 100MG TAB: 100 | 30 days supply | Qty: 30 | Fill #1

## 2020-06-16 ENCOUNTER — Encounter: Payer: Self-pay | Admitting: Internal Medicine

## 2020-06-16 ENCOUNTER — Other Ambulatory Visit: Payer: Self-pay | Admitting: Internal Medicine

## 2020-06-16 ENCOUNTER — Ambulatory Visit: Payer: Self-pay | Attending: Internal Medicine | Admitting: Internal Medicine

## 2020-06-16 ENCOUNTER — Other Ambulatory Visit: Payer: Self-pay

## 2020-06-16 VITALS — BP 131/79 | HR 79 | Resp 16 | Wt 219.0 lb

## 2020-06-16 DIAGNOSIS — F1021 Alcohol dependence, in remission: Secondary | ICD-10-CM

## 2020-06-16 DIAGNOSIS — E669 Obesity, unspecified: Secondary | ICD-10-CM

## 2020-06-16 DIAGNOSIS — M546 Pain in thoracic spine: Secondary | ICD-10-CM

## 2020-06-16 DIAGNOSIS — Z23 Encounter for immunization: Secondary | ICD-10-CM

## 2020-06-16 DIAGNOSIS — K7030 Alcoholic cirrhosis of liver without ascites: Secondary | ICD-10-CM

## 2020-06-16 DIAGNOSIS — D696 Thrombocytopenia, unspecified: Secondary | ICD-10-CM

## 2020-06-16 DIAGNOSIS — I8500 Esophageal varices without bleeding: Secondary | ICD-10-CM

## 2020-06-16 MED ORDER — SPIRONOLACTONE 100 MG PO TABS: 100.0000 mg | ORAL_TABLET | Freq: Every day | ORAL | 2 refills | Status: DC

## 2020-06-16 MED ORDER — FUROSEMIDE 40 MG PO TABS: 40.0000 mg | ORAL_TABLET | Freq: Two times a day (BID) | ORAL | 5 refills | Status: DC

## 2020-06-16 MED ORDER — ONDANSETRON HCL 4 MG PO TABS: 4.0000 mg | ORAL_TABLET | Freq: Four times a day (QID) | ORAL | 0 refills | Status: DC | PRN

## 2020-06-16 NOTE — Progress Notes (Signed)
Pt states his back hurts only when he bends down

## 2020-06-16 NOTE — Progress Notes (Signed)
Patient ID: Jason [name], male    DOB: July 15, 1980  MRN: [id number]  CC: Follow-up   Subjective: Jason [name] is a 40 y.o. male who presents for chronic ds management.  Daughter is with him His concerns today include:  Patient with history of severeETOH usedisorder in remission, alcohol induced cirrhosis, SBP, varices  Cirrhosis: still followed by Dr. Barron Alvine.  Had EGD 01/2020.  This revealed a scar from previous variceal banding.  No new variceal seen.  Some inflammation in the stomach.  Pathology showed mild chronic gastritis.  H. pylori negative. He has remain free of ETOH Requesting RF on Zofran.  Rxn last him several mths Taking Lactulose as prescribed.  Has average 3 soft BMs a day.  Taking Furosemide and Spironolactone.  No Swelling in legs and abdomen.    Obese:  wgh stable since 01/2020.  Admits that he can do better with eating habits.  He tends to overeat.  Complains of some mild pain in the mid thoracic area of his back on both sides x10 days.  No initiating factors.  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  Denies any dysuria.  No radiation of pain down the legs.  No numbness or tingling.  He feels it more when he lays down and when he bends over.   HM: Needs flu vaccine. Completed 2 shots of COVID-19 Pfizer.  Due for booster shot. Patient Active Problem List   Diagnosis Date Noted  . Portal hypertensive gastropathy (HCC)   . Esophageal varices without bleeding (HCC)   . Former smoker 06/10/2018  . Ascites   . Hepatic cirrhosis (HCC) 05/01/2018  . SBP (spontaneous bacterial peritonitis) (HCC) 05/01/2018  . Macrocytic anemia 05/01/2018  . Thrombocytopenia (HCC) 05/01/2018  . Hypokalemia 05/01/2018  . Liver lesion, right lobe 05/01/2018  . Liver failure (HCC) 05/01/2018  . Liver failure without hepatic coma (HCC)   . Alcohol abuse   . Hematemesis 03/04/2018  . Thrombocytopenia (HCC) 03/04/2018  . Alcoholic hepatitis  with ascites   . Alcoholic cirrhosis of liver with ascites (HCC) 06/29/2017  . BMI 40.0-44.9, adult (HCC) 06/29/2017     Current Outpatient Medications on File Prior to Visit  Medication Sig Dispense Refill  . acidophilus (RISAQUAD) CAPS capsule Take 1 capsule by mouth daily. 30 capsule 3  . famotidine (PEPCID) 20 MG tablet Take 1 tablet (20 mg total) by mouth daily. 90 tablet 4  . folic acid (FOLVITE) 1 MG tablet Take 1 tablet (1 mg total) by mouth daily. 30 tablet 2  . furosemide (LASIX) 40 MG tablet TAKE 1 TABLET (40 MG TOTAL) BY MOUTH 2 (TWO) TIMES DAILY. (Patient taking differently: Take 40 mg by mouth 2 (two) times daily. Morning & afternoon.) 60 tablet 5  . lactulose, encephalopathy, (CHRONULAC) 10 GM/15ML SOLN Take 30 mLs (20 g total) by mouth 2 (two) times daily. 1800 mL 1  . Menthol, Topical Analgesic, (ICY HOT EX) Place 1 patch onto the skin daily as needed (pain.).    Marland Kitchen Multiple Vitamin (MULTIVITAMIN WITH MINERALS) TABS tablet Take 1 tablet by mouth daily. 30 tablet 2  . ondansetron (ZOFRAN) 4 MG tablet TAKE 1 TABLET (4 MG TOTAL) BY MOUTH EVERY 6 (SIX) HOURS AS NEEDED FOR NAUSEA OR VOMITING. 30 tablet 0  . potassium chloride SA (KLOR-CON) 20 MEQ tablet TAKE 1 TABLET (20 MEQ TOTAL) BY MOUTH 2 (TWO) TIMES DAILY. 60 tablet 5  . spironolactone (ALDACTONE) 100 MG tablet TAKE 1 TABLET (100 MG  TOTAL) BY MOUTH DAILY. 30 tablet 2  . thiamine 100 MG tablet Take 1 tablet (100 mg total) by mouth daily. 30 tablet 5   No current facility-administered medications on file prior to visit.    No Known Allergies  Social History   Socioeconomic History  . Marital status: Legally Separated    Spouse name: Not on file  . Number of children: Not on file  . Years of education: Not on file  . Highest education level: Not on file  Occupational History  . Occupation: Education administratorainter Conservation officer, historic buildings(construction)  Tobacco Use  . Smoking status: Former Games developermoker  . Smokeless tobacco: Never Used  . Tobacco comment: said he  has tried it  Advertising account plannerVaping Use  . Vaping Use: Never used  Substance and Sexual Activity  . Alcohol use: Not Currently    Comment: 03/05/2018  . Drug use: Never  . Sexual activity: Yes  Other Topics Concern  . Not on file  Social History Narrative   ** Merged History Encounter **       [place] in [place] and daughter.    Social Determinants of Health   Financial Resource Strain: Not on file  Food Insecurity: Not on file  Transportation Needs: Not on file  Physical Activity: Not on file  Stress: Not on file  Social Connections: Not on file  Intimate Partner Violence: Not on file    Family History  Problem Relation Age of Onset  . Colon cancer Neg Hx   . Esophageal cancer Neg Hx   . Healthy Mother   . Healthy Daughter     Past Surgical History:  Procedure Laterality Date  . BIOPSY  08/10/2019   Procedure: BIOPSY;  Surgeon: Shellia Cleverlyirigliano, Vito V, DO;  Location: [facility];  Service: Gastroenterology;;  . BIOPSY  02/08/2020   Procedure: BIOPSY;  Surgeon: Shellia Cleverlyirigliano, Vito V, DO;  Location: [facility];  Service: Gastroenterology;;  . ESOPHAGEAL BANDING  08/10/2019   Procedure: ESOPHAGEAL BANDING;  Surgeon: Shellia Cleverlyirigliano, Vito V, DO;  Location: [facility];  Service: Gastroenterology;;  . ESOPHAGOGASTRODUODENOSCOPY (EGD) WITH PROPOFOL N/A 08/10/2019   Procedure: ESOPHAGOGASTRODUODENOSCOPY (EGD) WITH PROPOFOL;  Surgeon: Shellia Cleverlyirigliano, Vito V, DO;  Location: [facility];  Service: Gastroenterology;  Laterality: N/A;  . ESOPHAGOGASTRODUODENOSCOPY (EGD) WITH PROPOFOL N/A 02/08/2020   Procedure: ESOPHAGOGASTRODUODENOSCOPY (EGD) WITH PROPOFOL;  Surgeon: Shellia Cleverlyirigliano, Vito V, DO;  Location: [facility];  Service: Gastroenterology;  Laterality: N/A;  . FOOT SURGERY Left    around 2014. a scar on lower leg  . ORIF ANKLE FRACTURE Right 06/09/2019   Procedure: RIGHT OPEN REDUCTION INTERNAL FIXATION (ORIF) ANKLE FRACTURE;  Surgeon: Sheral ApleyMurphy, Timothy D, MD;  [facility];   Service: Orthopedics;  Laterality: Right;    ROS: Review of Systems Negative except as stated above  PHYSICAL EXAM: BP 131/79   Pulse 79   Resp 16   Wt 219 lb (99.3 kg)   SpO2 99%   BMI 36.44 kg/m   Wt Readings from Last 3 Encounters:  06/16/20 219 lb (99.3 kg)  02/08/20 220 lb (99.8 kg)  12/22/19 214 lb 8 oz (97.3 kg)    Physical Exam  General appearance - alert, well appearing, middle-age Hispanic male and in no distress Mental status - normal mood, behavior, speech, dress, motor activity, and thought processes Chest - clear to auscultation, no wheezes, rales or rhonchi, symmetric air entry Heart - normal rate, regular rhythm, normal S1, S2, no murmurs, rubs, clicks or gallops Abdomen -obese, soft, nontender, nondistended,  no masses or organomegaly Musculoskeletal -slight tenderness on palpation of the paraspinal muscles in the mid thoracic region.  No tenderness on palpation of the thoracic spine.  Power in the lower extremities 5/5 bilaterally. Extremities -some varicose veins in the lower extremities but no lower extremity edema.  {male adult   CMP Latest Ref Rng & Units 09/10/2019 06/09/2019 05/07/2019  Glucose 65 - 99 mg/dL 144(Y) 185(U) 90  BUN 6 - 20 mg/dL 10 12 12   Creatinine 0.76 - 1.27 mg/dL 3.14) 9.70(Y)  Sodium 134 - 144 mmol/L 135 141 139  Potassium 3.5 - 5.2 mmol/L 3.9 3.9 3.8  Chloride 96 - 106 mmol/L 104 108 109(H)  CO2 20 - 29 mmol/L 21 - 21  Calcium 8.7 - 10.2 mg/dL 8.7 - 8.8  Total Protein 6.0 - 8.5 g/dL 6.1 - 6.3  Total Bilirubin 0.0 - 1.2 mg/dL 2.6(H) - 2.8(H)  Alkaline Phos 39 - 117 IU/L 311(H) - 266(H)  AST 0 - 40 IU/L 63(H) - 73(H)  ALT 0 - 44 IU/L 32 - 48(H)   Lipid Panel  No results found for: CHOL, TRIG, HDL, CHOLHDL, VLDL, LDLCALC, LDLDIRECT  CBC    Component Value Date/Time   WBC 5.3 09/10/2019 1432   WBC 5.0 06/09/2019 0910   RBC 4.02 (L) 09/10/2019 1432   RBC 3.91 (L) 06/09/2019 0910   HGB 13.5 09/10/2019 1432   HCT 37.9  09/10/2019 1432   PLT 43 (LL) 09/10/2019 1432   MCV 94 09/10/2019 1432   MCH 33.6 (H) 09/10/2019 1432   MCH 33.8 06/09/2019 0910   MCHC 35.6 09/10/2019 1432   MCHC 33.7 06/09/2019 0910   RDW 14.1 09/10/2019 1432   LYMPHSABS 1.6 05/07/2019 1413   MONOABS 0.8 05/19/2018 1020   EOSABS 0.1 05/07/2019 1413   BASOSABS 0.0 05/07/2019 1413    ASSESSMENT AND PLAN: 1. Alcoholic cirrhosis of liver without ascites (HCC) Compensated.  Continue his current medications including furosemide and Aldactazide. - ondansetron (ZOFRAN) 4 MG tablet; Take 1 tablet (4 mg total) by mouth every 6 (six) hours as needed for nausea or vomiting.  Dispense: 30 tablet; Refill: 0 - CBC - spironolactone (ALDACTONE) 100 MG tablet; Take 1 tablet (100 mg total) by mouth daily.  Dispense: 30 tablet; Refill: 2 - furosemide (LASIX) 40 MG tablet; Take 1 tablet (40 mg total) by mouth 2 (two) times daily.  Dispense: 60 tablet; Refill: 5  2. Alcohol use disorder, severe, in sustained remission (HCC) Commended him on his sustained remission.  He will continue to remain free of alcohol.  3. Esophageal varices without bleeding, unspecified esophageal varices type (HCC) Varices have healed based on last EGD finding in October of last year. - CBC  4. Thrombocytopenia (HCC) Due to cirrhosis  5. Acute bilateral thoracic back pain Deemed to be MSK in nature unlikely related to his work.  I recommend that he tries to avoid doing any heavy lifting for the next 1 to 2 weeks. Use a heating pad when sitting and laying down.  Purchase and use some icy hot over-the-counter as needed.  6. Obesity (BMI 35.0-39.9 without comorbidity) Dietary counseling given.  Printed information also provided. - Comprehensive metabolic panel - Hemoglobin A1c - Lipid panel  7. Need for influenza vaccination Given today. I have submitted his name to our COVID-19 vaccine clinic so they can call him and schedule appointment for the booster  shot.  Patient was given the opportunity to ask questions.  Patient verbalized understanding of the plan  and was able to repeat key elements of the plan.  AMN interpreter used during this encounter.#[id number]  No orders of the defined types were placed in this encounter.    Requested Prescriptions    No prescriptions requested or ordered in this encounter    No follow-ups on file.  Jonah Blue, MD, FACP

## 2020-06-16 NOTE — Patient Instructions (Signed)
Alimentacin saludable Healthy Eating Seguir una modalidad de alimentacin saludable puede ayudarlo a Science writer y Theatre manager un peso saludable, reducir el riesgo de tener enfermedades crnicas y vivir Ardelia Mems vida larga y productiva. Es importante que siga una modalidad de alimentacin saludable con un nivel adecuado de caloras para su cuerpo. Debe cubrir sus necesidades nutricionales principalmente a travs de los alimentos, escogiendo una variedad de alimentos ricos en nutrientes. Cules son algunos consejos para seguir este plan? Lea las etiquetas de los alimentos  Lea las etiquetas y elija las que digan lo siguiente: ? Reducido en sodio o con bajo contenido de [place]. ? Jugos con 100% jugo de fruta. ? Alimentos con bajo contenido de grasas saturadas y alto contenido de grasas poliinsaturadas y Veterinary surgeon. ? Alimentos con cereales integrales, como trigo integral, trigo partido, arroz integral y arroz salvaje. ? Cereales integrales fortificados con cido flico. Se recomienda a las mujeres embarazadas o que desean quedar embarazadas.  Lea las etiquetas y evite: ? Los alimentos con una gran cantidad de Nurse, learning disability. Estos incluyen los alimentos que contienen azcar moreno, endulzante a base de maz, jarabe de maz, dextrosa, fructosa, glucosa, jarabe de maz de alta fructosa, miel, azcar invertido, lactosa, jarabe de [place], maltosa, [place], azcar sin refinar, sacarosa, trehalosa y azcar turbinado.  No consuma ms que las siguientes cantidades de azcar agregada por da:  6 cucharaditas (25 g) las mujeres.  9 cucharaditas (38 g) los hombres. ? Los alimentos que contienen almidones y cereales refinados o procesados. ? Los productos de cereales refinados, como harina blanca, harina de maz desgerminada, pan blanco y arroz blanco. Al ir de compras  Elija refrigerios ricos en nutrientes, como verduras, frutas enteras y frutos secos. Evite los refrigerios con alto contenido de caloras y  Location manager, como las papas fritas, los refrigerios frutales y los caramelos.  Use alios y productos para untar a base de aceite con los Building surveyor de grasas slidas como la [place], la margarina en barra o el queso crema.  Limite las salsas, las mezclas y los productos "instantneos" preelaborados como el arroz saborizado, los fideos instantneos y las pastas listas para comer.  Pruebe ms fuentes de protena vegetal, como tofu, tempeh, frijoles negros, edamame, lentejas, frutos secos y semillas.  Explore planes de alimentacin como la dieta mediterrnea o la dieta vegetariana. Al cocinar  Use aceite para Lobbyist de grasas slidas como [place], margarina en barra o [place] de cerdo.  En lugar de frer, trate de cocinar en el horno, en la plancha o en la parrilla, o hervir los alimentos.  Retire la parte grasa de las carnes antes de cocinarlas.  Cocine las verduras al vapor en agua o caldo. Planificacin de las comidas  En las comidas, imagine dividir su plato en cuartos: ? La mitad del plato tiene frutas y verduras. ? Un cuarto del plato tiene cereales integrales. ? Un cuarto del plato tiene protena, especialmente carnes [place], aves, huevos, tofu, frijoles o frutos secos.  Incluya lcteos descremados en su dieta diaria.   Estilo de vida  Elija opciones saludables en todos los mbitos, como en el hogar, el [place], la [place], los restaurantes y [place].  Prepare los alimentos de un modo seguro: ? Lvese las manos despus de manipular carnes crudas. ? [place] de preparacin de los alimentos limpias lavndolas regularmente con agua caliente y [place]. ? Mantenga las carnes crudas separadas de los alimentos que estn listos para comer como las frutas y las verduras. ?  Cocine los frutos de mar, carnes, aves y huevos hasta alcanzar la temperatura interna recomendada. ? Almacene los alimentos a temperaturas seguras. En  general:  Mantenga los alimentos fros a una temperatura de 40F (4,4C) o inferior.  Mantenga los alimentos calientes a una temperatura de 140F (60C) o superior.  Mantenga el congelador a una temperatura de 0 F (-17,8C) o inferior.  Los alimentos dejan de ser seguros para su consumo cuando han estado a una temperatura de entre 40 y 140F (4,4 y 60C) por ms de 2horas. Qu alimentos debo consumir? Frutas Propngase comer el equivalente a 2tazas de frutas frescas, enlatadas (en su jugo natural) o congeladas cada da. Algunos ejemplos de equivalentes a 1taza de frutas son 1manzana pequea, 8fresas grandes, 1taza de fruta enlatada, taza de fruta desecada o 1 taza de jugo 100%. Verduras Propngase comer el equivalente a 2 o 3tazas de verduras frescas y congeladas cada da, incluyendo diferentes variedades y colores. Algunos ejemplos de equivalentes a 1taza de verduras son 2zanahorias medianas, 2 tazas de verduras de hoja verde crudas, 1taza de verduras cortadas (crudas o cocidas) o 1papa mediana al horno. Granos Propngase comer el equivalente a 6onzas de cereales integrales por da. Algunos ejemplos de equivalentes a 1onza de cereales son 1rebanada de pan, 1taza de cereal listo para comer, 3tazas de palomitas de maz o  taza de arroz, pasta o cereales cocidos. Carnes y otras protenas Propngase comer el equivalente a 5 o 6onzas de protena por da. Algunos ejemplos de equivalentes a 1 onza de protena son 1huevo, taza de frutos secos o semillas o 1 cucharada (16g) de mantequilla de man. Un corte de carne o pescado del tamao de un mazo de cartas equivale aproximadamente a 3 a 4 onzas.  De las protenas que consume cada semana, intente que al menos 8onzas provengan de frutos de mar. Esto incluye el salmn, la trucha, el arenque y las anchoas. Lcteos Propngase comer el equivalente a 3tazas de lcteos descremados o con bajo contenido de grasa cada da.  Algunos ejemplos de equivalentes a 1taza de lcteos son 1taza (240ml) de leche, 8onzas (250g) de yogur, 1onzas (44g) de queso natural o 1 taza (240ml) de leche de soja fortificada. Grasas y aceites  Propngase consumir alrededor de 5 cucharaditas (21g) por da. Elija grasas monoinsaturadas, como el aceite de canola y de oliva, aguacate, mantequilla de man y la mayora de los frutos secos, o bien grasas poliinsaturadas, como el aceite de girasol, maz y soja, nueces, piones, semillas de ssamo, semillas de girasol y semillas de lino. Bebidas  Propngase beber seis vasos de 8 onzas de agua por da. Limite el caf a entre tres y cinco tazas de 8 onzas por da.  Limite el consumo de bebidas con cafena que tengan caloras agregadas, como los refrescos y las bebidas energizantes.  Limite el consumo de alcohol a no ms de 1medida por da si es mujer y no est embarazada, y a 2medidas por da si es hombre. Una medida equivale a 12onzas de cerveza (355ml), 5onzas de vino (148ml) o 1onzas de bebidas alcohlicas de alta graduacin (44ml). Condimentos y otros alimentos  Eviteagregar cantidades excesivas de sal a los alimentos. Pruebe darles sabor con hierbas y especias en lugar de sal.  Evite agregar azcar a los alimentos.  Pruebe usar alios, salsas y productos untables a base de aceite en lugar de grasas slidas. Esta informacin se basa en las pautas generales de nutricin de los [place]. Para obtener ms informacin, visite choosemyplate.gov. Las   cantidades exactas pueden variar en funcin de sus necesidades nutricionales. Resumen  Un plan de alimentacin saludable puede ayudarlo a mantener un peso saludable, reducir el riesgo de tener enfermedades crnicas y mantenerse activo durante toda su vida.  Planifique sus comidas. Asegrese de consumir las porciones correctas de una variedad de alimentos ricos en nutrientes.  En lugar de frer, trate de cocinar en el horno, en la  plancha o en la parrilla, o hervir los alimentos.  Elija opciones saludables en todos los mbitos, como en el hogar, el trabajo, la escuela, los restaurantes y las tiendas. Esta informacin no tiene como fin reemplazar el consejo del mdico. Asegrese de hacerle al mdico cualquier pregunta que tenga. Document Revised: 09/09/2017 Document Reviewed: 09/09/2017 Elsevier Patient Education  2021 Elsevier Inc.  

## 2020-06-17 LAB — COMPREHENSIVE METABOLIC PANEL
ALT: 38 IU/L (ref 0–44)
AST: 57 IU/L — ABNORMAL HIGH (ref 0–40)
Albumin/Globulin Ratio: 1.5 (ref 1.2–2.2)
Albumin: 3.8 g/dL — ABNORMAL LOW (ref 4.0–5.0)
Alkaline Phosphatase: 207 IU/L — ABNORMAL HIGH (ref 44–121)
BUN/Creatinine Ratio: 13 (ref 9–20)
BUN: 8 mg/dL (ref 6–20)
Bilirubin Total: 1.9 mg/dL — ABNORMAL HIGH (ref 0.0–1.2)
CO2: 22 mmol/L (ref 20–29)
Calcium: 9 mg/dL (ref 8.7–10.2)
Chloride: 105 mmol/L (ref 96–106)
Creatinine, Ser: 0.64 mg/dL — ABNORMAL LOW (ref 0.76–1.27)
GFR calc Af Amer: 145 mL/min/{1.73_m2} (ref >59)
GFR calc non Af Amer: 125 mL/min/{1.73_m2} (ref >59)
Globulin, Total: 2.5 g/dL (ref 1.5–4.5)
Glucose: 79 mg/dL (ref 65–99)
Potassium: 3.6 mmol/L (ref 3.5–5.2)
Sodium: 141 mmol/L (ref 134–144)
Total Protein: 6.3 g/dL (ref 6.0–8.5)

## 2020-06-17 LAB — CBC
Hematocrit: 42.6 % (ref 37.5–51.0)
Hemoglobin: 14.9 g/dL (ref 13.0–17.7)
MCH: 33.3 pg — ABNORMAL HIGH (ref 26.6–33.0)
MCHC: 35 g/dL (ref 31.5–35.7)
MCV: 95 fL (ref 79–97)
Platelets: 44 10*3/uL — CL (ref 150–450)
RBC: 4.48 x10E6/uL (ref 4.14–5.80)
RDW: 13.9 % (ref 11.6–15.4)
WBC: 6 10*3/uL (ref 3.4–10.8)

## 2020-06-17 LAB — LIPID PANEL
Chol/HDL Ratio: 1.8 ratio (ref 0.0–5.0)
Cholesterol, Total: 150 mg/dL (ref 100–199)
HDL: 85 mg/dL (ref >39)
LDL Chol Calc (NIH): 52 mg/dL (ref 0–99)
Triglycerides: 65 mg/dL (ref 0–149)
VLDL Cholesterol Cal: 13 mg/dL (ref 5–40)

## 2020-06-17 LAB — HEMOGLOBIN A1C
Est. average glucose Bld gHb Est-mCnc: 85 mg/dL
Hgb A1c MFr Bld: 4.6 % — ABNORMAL LOW (ref 4.8–5.6)

## 2020-06-17 MED FILL — ONDANSETRON HCL 4 MG TABLET: 4 | 7 days supply | Qty: 30 | Fill #0

## 2020-06-17 MED FILL — ?FUROSEMIDE 40 MG TABLET: 40 | 30 days supply | Qty: 60 | Fill #0

## 2020-06-17 MED FILL — SPIRONOLACTONE 100 MG TAB: 100 | 30 days supply | Qty: 30 | Fill #0

## 2020-06-29 MED FILL — ?FUROSEMIDE 40 MG TABLET: 40 | 30 days supply | Qty: 60 | Fill #0

## 2020-06-29 MED FILL — ONDANSETRON HCL 4 MG TABLET: 4 | 7 days supply | Qty: 30 | Fill #0

## 2020-06-29 MED FILL — ?SPIRONOLACTON 100MG TABL: 100 | 30 days supply | Qty: 30 | Fill #0

## 2020-07-07 MED FILL — ?SPIRONOLACTONE 100MG TAB: 100 | 30 days supply | Qty: 30 | Fill #0

## 2020-07-07 MED FILL — ONDANSETRON HCL 4 MG TABLET: 4 | 7 days supply | Qty: 30 | Fill #0

## 2020-07-07 MED FILL — ?FUROSEMIDE 40 MG TABLET: 40 | 30 days supply | Qty: 60 | Fill #0

## 2020-07-19 ENCOUNTER — Ambulatory Visit: Payer: Self-pay | Attending: Internal Medicine

## 2020-07-19 DIAGNOSIS — Z23 Encounter for immunization: Secondary | ICD-10-CM

## 2020-07-19 MED FILL — ?FAMOTIDINE 20MG TABLET: 20 | 30 days supply | Qty: 30 | Fill #5

## 2020-07-19 MED FILL — FOLIC ACID 1 MG TABS: 1 | 30 days supply | Qty: 30 | Fill #2

## 2020-07-19 MED FILL — LACTULOSE 10 GM/15 ML SOLN: 10 | 30 days supply | Qty: 1800 | Fill #1

## 2020-07-19 MED FILL — POTASSIUM CL ER 20 MEQ TAB: 20 | 30 days supply | Qty: 60 | Fill #1

## 2020-07-19 NOTE — Progress Notes (Signed)
   Covid-19 Vaccination Clinic  Name:  Beecher Furio    MRN: [id number] DOB: June 07, 1980  07/19/2020  Mr. Candee Furbish was observed post Covid-19 immunization for 15 minutes without incident. He was provided with Vaccine Information Sheet and instruction to access the V-Safe system. Vaccinated by Markus Jarvis.  Mr. Rashidi Loh was instructed to call 911 with any severe reactions post vaccine: Marland Kitchen Difficulty breathing  . Swelling of face and throat  . A fast heartbeat  . A bad rash all over body  . Dizziness and weakness   Immunizations Administered    Name Date Dose VIS Date Route   PFIZER Comrnaty(Gray TOP) Covid-19 Vaccine 07/19/2020  3:32 PM 0.3 mL 04/07/2020 Intramuscular   Manufacturer: [employer], [employer]   Lot: [id number]   NDC: [phone]

## 2020-07-30 ENCOUNTER — Other Ambulatory Visit: Payer: Self-pay

## 2020-08-09 IMAGING — MR MRI ABDOMEN WITH AND WITHOUT CONTRAST
9 of 18 series · 21 of 48 positions shown · IV contrast (gadavist)
Comparison: Ultrasound from 03/06/2018

CLINICAL DATA: Hepatic cirrhosis.  Ascites.

EXAM:
MRI ABDOMEN WITHOUT AND WITH CONTRAST
TECHNIQUE: Multiplanar multisequence MR imaging of the abdomen was performed
both before and after the administration of intravenous contrast.
CONTRAST:  10 cc Gadavist

[Series 3: T2 fat-sat · axial · 5.0mm · 0.90mm/px · z∈[-122,+143]mm · 2 of 54 slices shown]
[im 1/54]
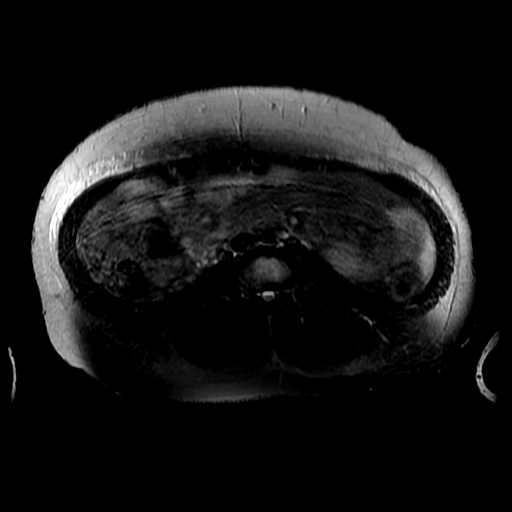
[im 54/54]
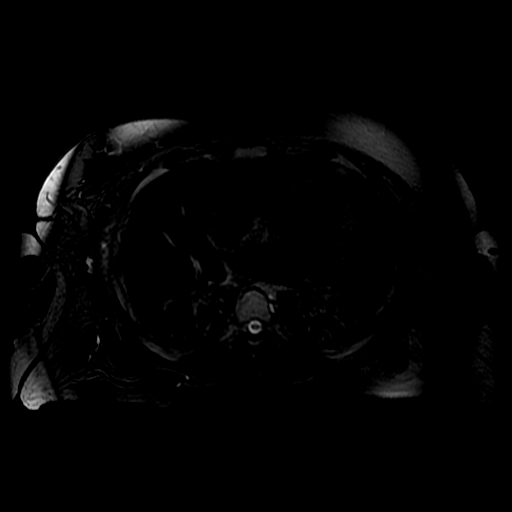

[Series 4: DWI b500 · axial · 6.0mm · 1.72mm/px · z∈[-123,+150]mm · 2 of 72 slices shown]
[im 1/72]
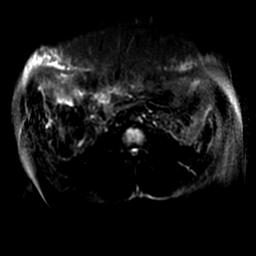
[im 72/72]
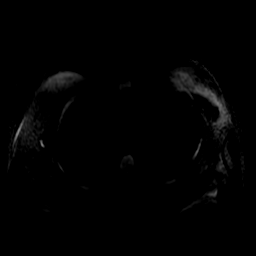

[Series 5: T2 · axial · 5.0mm · 0.90mm/px · z∈[-122,+143]mm · 2 of 54 slices shown (1 of 2)]
[im 1/54]
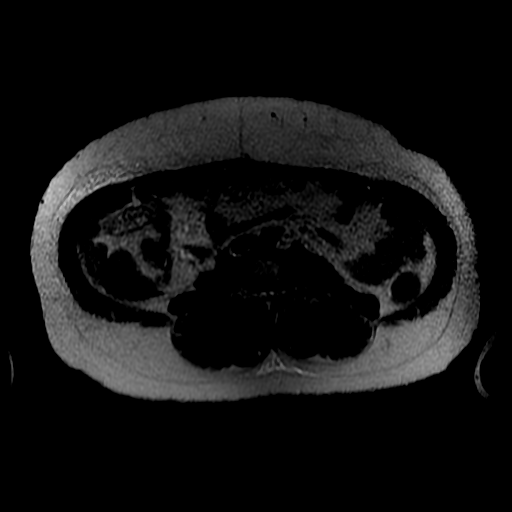
[im 54/54]
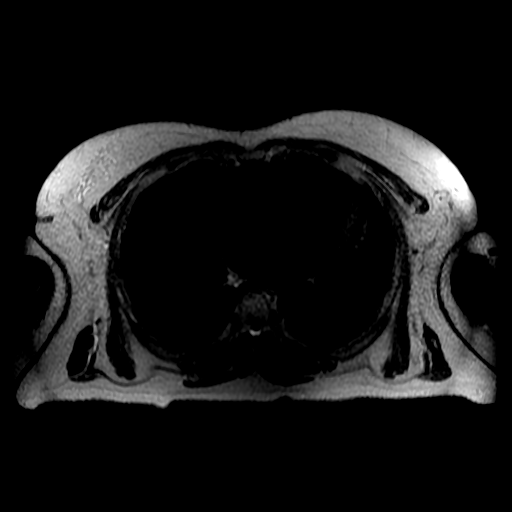

[Series 6: T2 · coronal · 5.0mm · 0.78mm/px · 2 of 52 slices shown (2 of 2)]
[im 1/52]
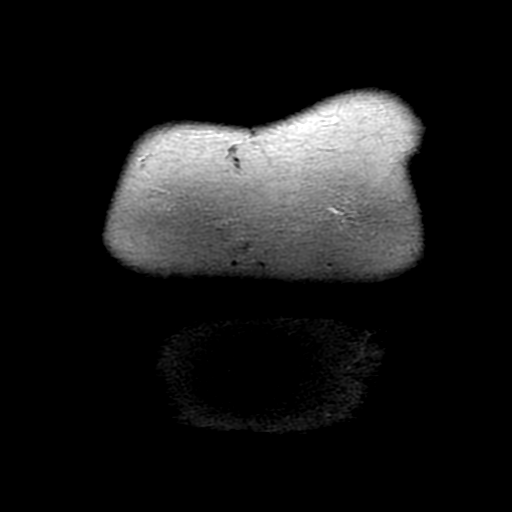
[im 52/52]
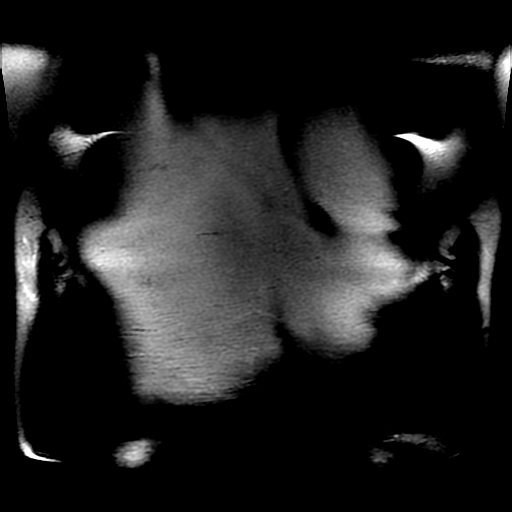

[Series 7: bSSFP · axial · 5.0mm · 0.90mm/px · z∈[-122,+143]mm · 2 of 54 slices shown]
[im 1/54]
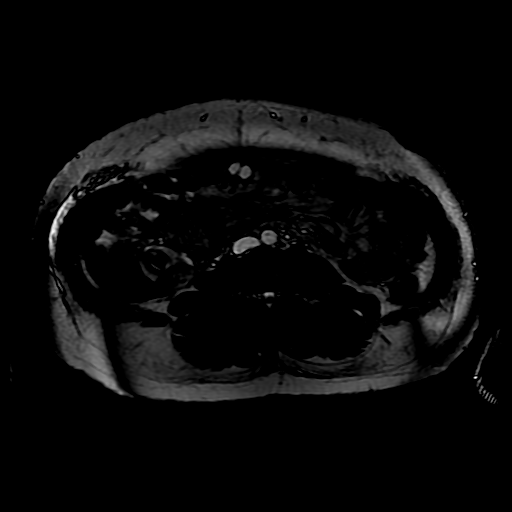
[im 54/54]
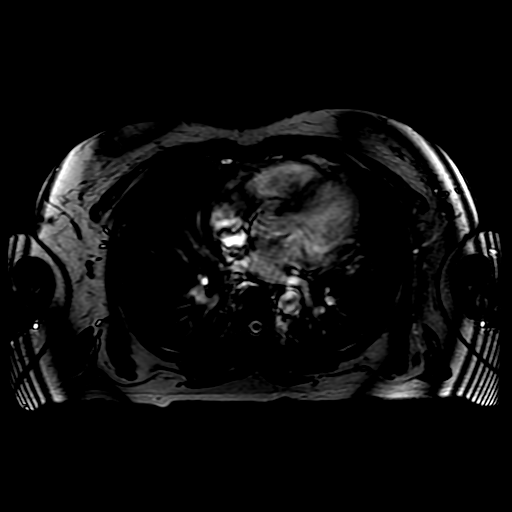

[Series 8: ax dualecho bh · axial · 5.0mm · 0.90mm/px · z∈[-122,+143]mm · 4 of 108 slices shown]
[im 1/108]
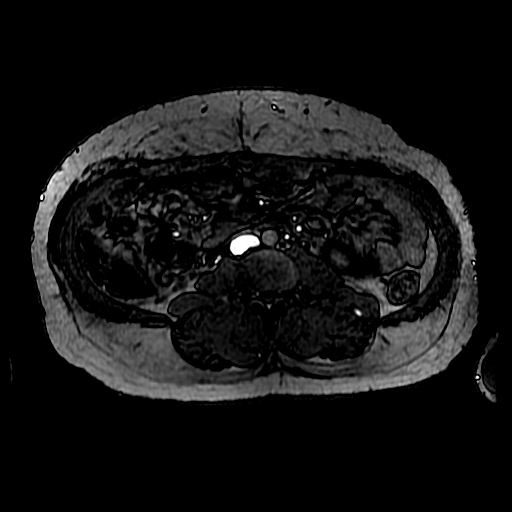
[im 36/108]
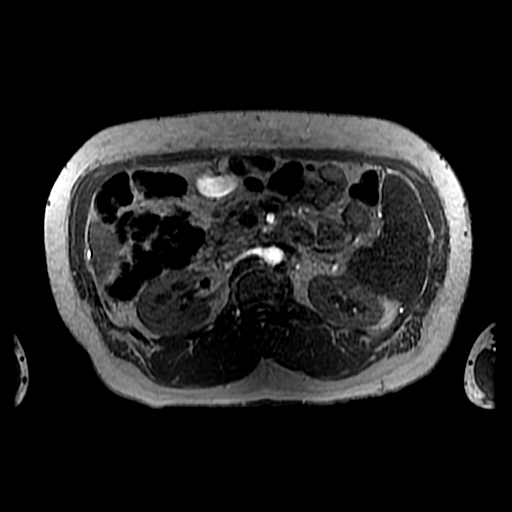
[im 72/108]
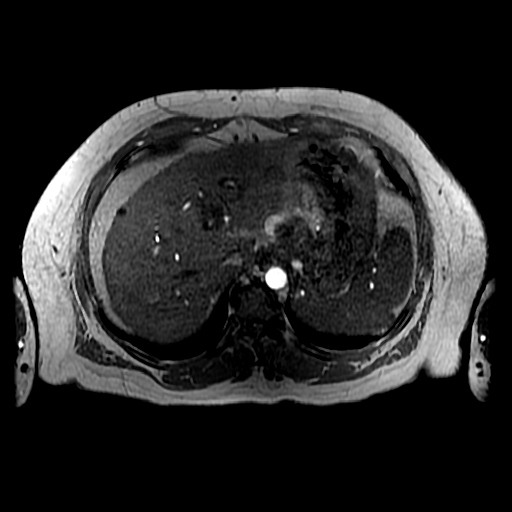
[im 108/108]
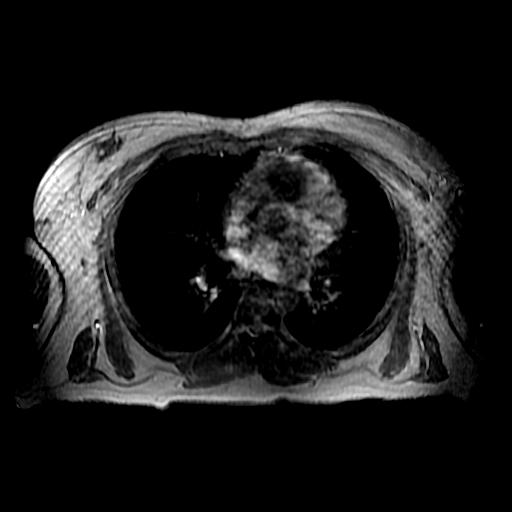

[Series 400: DWI · axial · 6.0mm · 1.72mm/px · 1 of 36 slices shown]
[im 1/36]
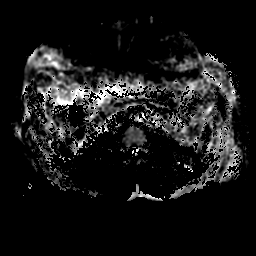

[Series 900: T1 dynamic · axial · 5.6mm · 0.86mm/px · z∈[-102,+141]mm · 3 of 88 slices shown (1 of 2)]
[im 1/88]
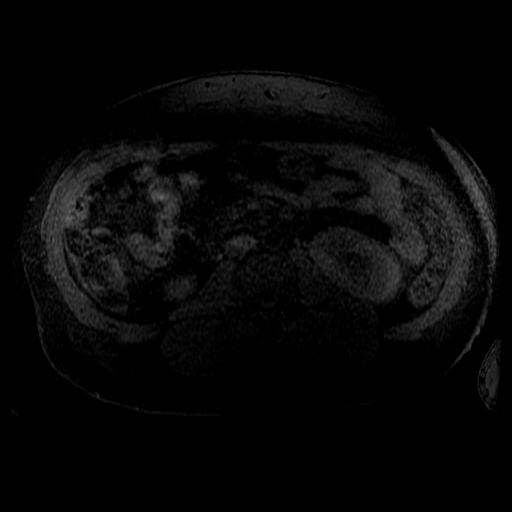
[im 44/88]
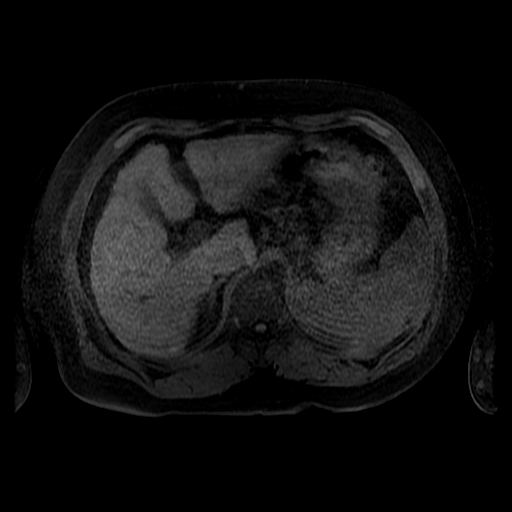
[im 88/88]
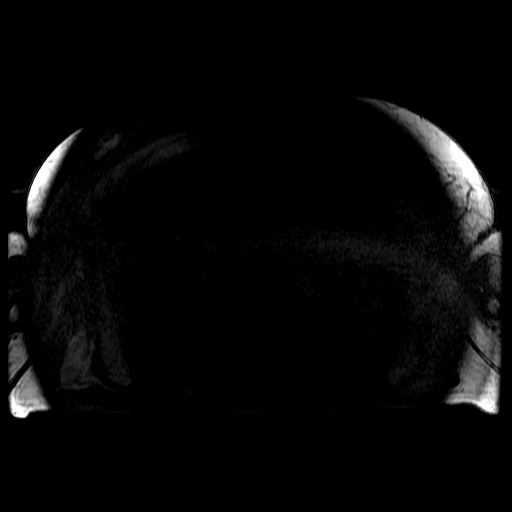

[Series 901: T1 dynamic · axial · 5.6mm · 0.86mm/px · z∈[-102,+141]mm · 3 of 88 slices shown (2 of 2)]
[im 1/88]
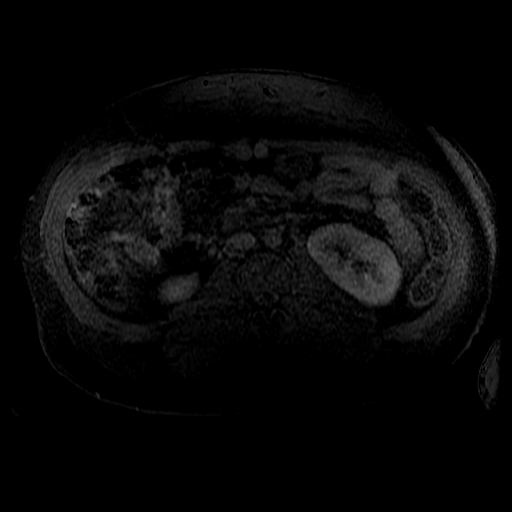
[im 44/88]
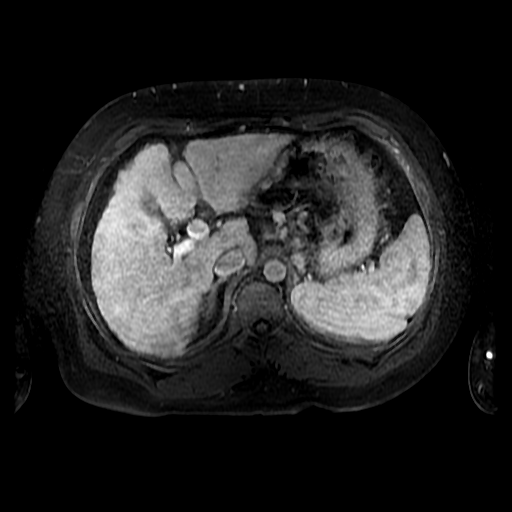
[im 88/88]
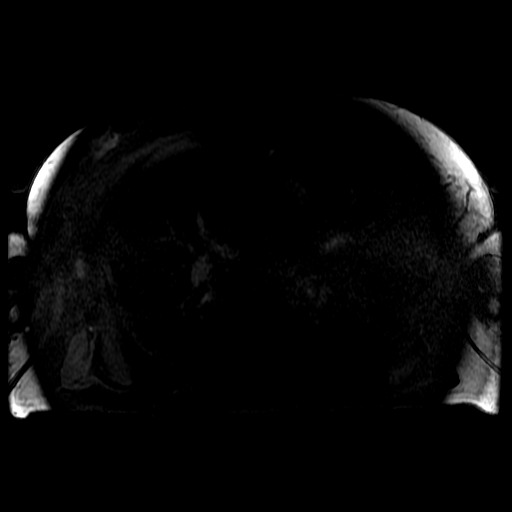

[21 of 48 positions shown; findings below may reference images not displayed]

FINDINGS: Lower chest: Suspected airway thickening in the lung bases. Mild
cardiomegaly.

Hepatobiliary: Several small hepatic cysts are present.

A 1.1 by 0.9 cm focus of arterial phase enhancement segment 2 of the
liver is shown on image [id number], and is not readily apparent on any
of the other series. No capsule or washout appearance (KEVIN
category LR-3).

Cirrhotic liver contour. Recanalized umbilical vein. Mild
gallbladder wall thickening. No definite stones. No biliary
dilatation.

Pancreas:  Unremarkable

Spleen: The spleen measures 14.4 by 6.4 by 17.0 cm (volume = 820
cm^3).

Adrenals/Urinary Tract:  Unremarkable where included.

Stomach/Bowel: Unremarkable

Vascular/Lymphatic: Mild uphill paraesophageal varices. Gastric
varices noted. Recanalized umbilical vein.

Other:  No supplemental non-categorized findings.

Musculoskeletal: Unremarkable
IMPRESSION: 1. A 1.1 by 0.9 cm focus of arterial phase enhancement in segment 2
of the liver is KEVIN category LR 3 (indeterminate). No washout or
capsule appearance. Consider repeat are alternative diagnostic
imaging in 3-6 months time.
2. Cirrhosis with signs of portal venous hypertension including
recanalized umbilical vein varices.
3. Suspected airway thickening in the lung bases.
4. Mild cardiomegaly.
5. Moderate splenomegaly.
6. Wall thickening in the gallbladder is nonspecific; although
inflammation is a possible cause, hypoproteinemia/hypoalbuminemia
would also be potential common causes.

## 2020-08-21 MED FILL — Furosemide Tab 40 MG: ORAL | 30 days supply | Qty: 60 | Fill #0 | Status: AC

## 2020-08-22 ENCOUNTER — Other Ambulatory Visit: Payer: Self-pay

## 2020-08-22 ENCOUNTER — Other Ambulatory Visit: Payer: Self-pay | Admitting: Internal Medicine

## 2020-08-22 DIAGNOSIS — K7030 Alcoholic cirrhosis of liver without ascites: Secondary | ICD-10-CM

## 2020-08-22 MED FILL — Potassium Chloride Microencapsulated Crys ER Tab 20 mEq: ORAL | 30 days supply | Qty: 60 | Fill #0 | Status: AC

## 2020-08-22 MED FILL — Spironolactone Tab 100 MG: ORAL | 30 days supply | Qty: 30 | Fill #0 | Status: AC

## 2020-08-22 NOTE — Telephone Encounter (Signed)
Requested medication (s) are due for refill today: no Requested medication (s) are on the active medication list: yes  Last refill:  07/07/2020  Future visit scheduled:  no  Notes to clinic:  this refill cannot be delegated    Requested Prescriptions  Pending Prescriptions Disp Refills   ondansetron (ZOFRAN) 4 MG tablet 30 tablet 0    Sig: TAKE 1 TABLET (4 MG TOTAL) BY MOUTH EVERY 6 (SIX) HOURS AS NEEDED FOR NAUSEA OR VOMITING.      Not Delegated - Gastroenterology: Antiemetics Failed - 08/22/2020  1:04 PM      Failed - This refill cannot be delegated      Passed - Valid encounter within last 6 months    Recent Outpatient Visits           2 months ago Alcoholic cirrhosis of liver without ascites [facility])   [place] [facility] Marcine Matar, MD   11 months ago Decompensated hepatic cirrhosis [facility])   [place] [facility] Marcine Matar, MD   1 year ago Ankle fracture, left, sequela   [place] [facility] Marcine Matar, MD   1 year ago Nonintractable headache, unspecified chronicity pattern, unspecified headache type   [facility] [place], [place], [place]   1 year ago Alcoholic cirrhosis of liver with ascites [facility])   [place] [facility] Cain Saupe, MD

## 2020-08-23 ENCOUNTER — Other Ambulatory Visit: Payer: Self-pay

## 2020-08-24 ENCOUNTER — Other Ambulatory Visit: Payer: Self-pay

## 2020-08-24 ENCOUNTER — Other Ambulatory Visit: Payer: Self-pay | Admitting: Gastroenterology

## 2020-08-24 DIAGNOSIS — K7031 Alcoholic cirrhosis of liver with ascites: Secondary | ICD-10-CM

## 2020-08-24 MED ORDER — ONDANSETRON HCL 4 MG PO TABS
ORAL_TABLET | ORAL | 0 refills | Status: DC
  Filled 2020-08-24: qty 30, 8d supply, fill #0

## 2020-08-25 ENCOUNTER — Other Ambulatory Visit: Payer: Self-pay

## 2020-08-25 MED ORDER — FOLIC ACID 1 MG PO TABS
1.0000 mg | ORAL_TABLET | Freq: Every day | ORAL | 2 refills | Status: DC
  Filled 2020-08-25: qty 30, 30d supply, fill #0
  Filled 2020-10-07: qty 30, 30d supply, fill #1
  Filled 2020-11-10: qty 30, 30d supply, fill #2

## 2020-09-13 DIAGNOSIS — I10 Essential (primary) hypertension: Secondary | ICD-10-CM | POA: Insufficient documentation

## 2020-09-13 DIAGNOSIS — K7682 Hepatic encephalopathy: Secondary | ICD-10-CM | POA: Insufficient documentation

## 2020-09-13 DIAGNOSIS — I8500 Esophageal varices without bleeding: Secondary | ICD-10-CM

## 2020-09-13 DIAGNOSIS — F1021 Alcohol dependence, in remission: Secondary | ICD-10-CM | POA: Diagnosis present

## 2020-09-13 HISTORY — DX: Hepatic encephalopathy: K76.82

## 2020-09-15 ENCOUNTER — Other Ambulatory Visit: Payer: Self-pay | Admitting: Nurse Practitioner

## 2020-09-15 DIAGNOSIS — K7031 Alcoholic cirrhosis of liver with ascites: Secondary | ICD-10-CM

## 2020-09-15 DIAGNOSIS — K769 Liver disease, unspecified: Secondary | ICD-10-CM

## 2020-09-19 ENCOUNTER — Ambulatory Visit: Payer: 59 | Attending: Internal Medicine | Admitting: Internal Medicine

## 2020-09-19 ENCOUNTER — Ambulatory Visit: Payer: Self-pay | Admitting: *Deleted

## 2020-09-19 ENCOUNTER — Encounter: Payer: Self-pay | Admitting: Internal Medicine

## 2020-09-19 ENCOUNTER — Other Ambulatory Visit: Payer: Self-pay

## 2020-09-19 ENCOUNTER — Other Ambulatory Visit: Payer: Self-pay | Admitting: Internal Medicine

## 2020-09-19 ENCOUNTER — Other Ambulatory Visit (HOSPITAL_COMMUNITY)
Admission: RE | Admit: 2020-09-19 | Discharge: 2020-09-19 | Disposition: A | Discharge status: Home / Self Care | Payer: 59 | Source: Ambulatory Visit | Attending: Internal Medicine | Admitting: Internal Medicine

## 2020-09-19 VITALS — BP 132/84 | HR 81 | Resp 16 | Wt 227.6 lb

## 2020-09-19 DIAGNOSIS — R310 Gross hematuria: Secondary | ICD-10-CM | POA: Insufficient documentation

## 2020-09-19 DIAGNOSIS — M545 Low back pain, unspecified: Secondary | ICD-10-CM | POA: Diagnosis not present

## 2020-09-19 DIAGNOSIS — K7030 Alcoholic cirrhosis of liver without ascites: Secondary | ICD-10-CM | POA: Diagnosis not present

## 2020-09-19 DIAGNOSIS — R319 Hematuria, unspecified: Secondary | ICD-10-CM | POA: Diagnosis not present

## 2020-09-19 LAB — POCT URINALYSIS DIP (CLINITEK)
Glucose, UA: NEGATIVE mg/dL
Ketones, POC UA: NEGATIVE mg/dL
Leukocytes, UA: NEGATIVE
Nitrite, UA: NEGATIVE
POC PROTEIN,UA: NEGATIVE
Spec Grav, UA: 1.02 (ref 1.010–1.025)
Urobilinogen, UA: >=8 E.U./dL — AB
pH, UA: 7.5 (ref 5.0–8.0)

## 2020-09-19 MED ORDER — LACTULOSE ENCEPHALOPATHY 10 GM/15ML PO SOLN
ORAL | 1 refills | Status: DC
  Filled 2020-09-19: qty 1800, 30d supply, fill #0
  Filled 2020-11-10: qty 1800, 30d supply, fill #1

## 2020-09-19 MED ORDER — SPIRONOLACTONE 100 MG PO TABS
ORAL_TABLET | Freq: Every day | ORAL | 2 refills | Status: DC
Start: 2020-09-19 — End: 2021-01-18
  Filled 2020-09-19: qty 30, 30d supply, fill #0
  Filled 2020-10-07 – 2020-11-10 (×2): qty 30, 30d supply, fill #1
  Filled 2020-12-13: qty 30, 30d supply, fill #2

## 2020-09-19 MED ORDER — DICLOFENAC SODIUM 1 % EX GEL
2.0000 g | Freq: Four times a day (QID) | CUTANEOUS | 0 refills | Status: DC
  Filled 2020-09-19: qty 100, 12d supply, fill #0

## 2020-09-19 NOTE — Progress Notes (Signed)
Patient ID: Jason [name], male    DOB: [birth year]  MRN: [id number]  CC: Hematuria   Subjective: Jason [name] is a 40 y.o. male who presents for UC visit for hematuria His concerns today include:  Patient with history of severeETOH usedisorderin remission, alcohol induced cirrhosis, SBP, varices  Pt c/o pain mid line lumbar area x 2 wks Most noticeable when sitting down and bending over.  Does not notice when up moving around. No radiation, numbness or tingling. No fever.  Rates pain 4-5/10.  Not taking anything for the pain.  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  Yesterday Notice blood in urine with a small clot in the urine.  some stinging at end of urination for the past 8 days and today. No dischg from penis.  Sexaully active with same male partner.  He requested refill on several of his medications for cirrhosis. Patient Active Problem List   Diagnosis Date Noted  . Portal hypertensive gastropathy (HCC)   . Esophageal varices without bleeding (HCC)   . Former smoker 06/10/2018  . Ascites   . Hepatic cirrhosis (HCC) 05/01/2018  . SBP (spontaneous bacterial peritonitis) (HCC) 05/01/2018  . Macrocytic anemia 05/01/2018  . Thrombocytopenia (HCC) 05/01/2018  . Hypokalemia 05/01/2018  . Liver lesion, right lobe 05/01/2018  . Liver failure (HCC) 05/01/2018  . Liver failure without hepatic coma (HCC)   . Alcohol use disorder, severe, in sustained remission (HCC)   . Hematemesis 03/04/2018  . Thrombocytopenia (HCC) 03/04/2018  . Alcoholic hepatitis with ascites   . Alcoholic cirrhosis of liver with ascites (HCC) 06/29/2017     Current Outpatient Medications on File Prior to Visit  Medication Sig Dispense Refill  . acidophilus (RISAQUAD) CAPS capsule Take 1 capsule by mouth daily. 30 capsule 3  . famotidine (PEPCID) 20 MG tablet Take 1 tablet (20 mg total) by mouth daily. 90 tablet 4  . folic acid  (FOLVITE) 1 MG tablet Take 1 tablet (1 mg total) by mouth daily. 30 tablet 2  . furosemide (LASIX) 40 MG tablet TAKE 1 TABLET (40 MG TOTAL) BY MOUTH 2 (TWO) TIMES DAILY. 60 tablet 5  . Menthol, Topical Analgesic, (ICY HOT EX) Place 1 patch onto the skin daily as needed (pain.).    Marland Kitchen Multiple Vitamin (MULTIVITAMIN WITH MINERALS) TABS tablet Take 1 tablet by mouth daily. 30 tablet 2  . ondansetron (ZOFRAN) 4 MG tablet TAKE 1 TABLET (4 MG TOTAL) BY MOUTH EVERY 6 (SIX) HOURS AS NEEDED FOR NAUSEA OR VOMITING. 30 tablet 0  . potassium chloride SA (KLOR-CON) 20 MEQ tablet TAKE 1 TABLET (20 MEQ TOTAL) BY MOUTH 2 (TWO) TIMES DAILY. 60 tablet 5  . thiamine 100 MG tablet Take 1 tablet (100 mg total) by mouth daily. 30 tablet 5   No current facility-administered medications on file prior to visit.    No Known Allergies  Social History   Socioeconomic History  . Marital status: Legally Separated    Spouse name: Not on file  . Number of children: Not on file  . Years of education: Not on file  . Highest education level: Not on file  Occupational History  . Occupation: Education administrator Conservation officer, historic buildings)  Tobacco Use  . Smoking status: Former Games developer  . Smokeless tobacco: Never Used  . Tobacco comment: said he has tried it  Advertising account planner  . Vaping Use: Never used  Substance and Sexual Activity  . Alcohol use: Not  Currently    Comment: 03/05/2018  . Drug use: Never  . Sexual activity: Yes  Other Topics Concern  . Not on file  Social History Narrative   ** Merged History Encounter **       Lives in [place] with wife and daughter.    Social Determinants of Health   Financial Resource Strain: Not on file  Food Insecurity: Not on file  Transportation Needs: Not on file  Physical Activity: Not on file  Stress: Not on file  Social Connections: Not on file  Intimate Partner Violence: Not on file    Family History  Problem Relation Age of Onset  . Colon cancer Neg Hx   . Esophageal cancer Neg Hx   .  Healthy Mother   . Healthy Daughter     Past Surgical History:  Procedure Laterality Date  . BIOPSY  08/10/2019   Procedure: BIOPSY;  Surgeon: Shellia Cleverly, DO;  Location: [facility];  Service: Gastroenterology;;  . BIOPSY  02/08/2020   Procedure: BIOPSY;  Surgeon: Shellia Cleverly, DO;  Location: [facility];  Service: Gastroenterology;;  . ESOPHAGEAL BANDING  08/10/2019   Procedure: ESOPHAGEAL BANDING;  Surgeon: Shellia Cleverly, DO;  Location: [facility];  Service: Gastroenterology;;  . ESOPHAGOGASTRODUODENOSCOPY (EGD) WITH PROPOFOL N/A 08/10/2019   Procedure: ESOPHAGOGASTRODUODENOSCOPY (EGD) WITH PROPOFOL;  Surgeon: Shellia Cleverly, DO;  [facility];  Service: Gastroenterology;  Laterality: N/A;  . ESOPHAGOGASTRODUODENOSCOPY (EGD) WITH PROPOFOL N/A 02/08/2020   Procedure: ESOPHAGOGASTRODUODENOSCOPY (EGD) WITH PROPOFOL;  Surgeon: Shellia Cleverly, DO;  Location[facility];  Service: Gastroenterology;  Laterality: N/A;  . FOOT SURGERY Left    around 2014. a scar on lower leg  . ORIF ANKLE FRACTURE Right 06/09/2019   Procedure: RIGHT OPEN REDUCTION INTERNAL FIXATION (ORIF) ANKLE FRACTURE;  Surgeon: Sheral Apley, MD;  Location: [facility];  Service: Orthopedics;  Laterality: Right;    ROS: Review of Systems Negative except as stated above  PHYSICAL EXAM: BP 132/84   Pulse 81   Resp 16   Wt 227 lb 9.6 oz (103.2 kg)   SpO2 99%   BMI 37.87 kg/m   Physical Exam  General appearance - alert, well appearing, and in no distress Mental status - normal mood, behavior, speech, dress, motor activity, and thought processes Abdomen: Normal bowel sounds, soft, nontender no palpable masses. GU Male -chaperone was CMA [place]: No penile lesions or discharge from the penis.  Musculoskeletal -no tenderness on palpation of the lumbar spine or surrounding paraspinal muscles.  Straight leg raise negative.  No tenderness on palpation of the flank  area.  CMP Latest Ref Rng & Units 06/16/2020 09/10/2019 06/09/2019  Glucose 65 - 99 mg/dL 79 779(T) 903(E)  BUN 6 - 20 mg/dL 8 10 12   Creatinine 0.76 - 1.27 mg/dL ) 0.92(Z 3.00)  Sodium 134 - 144 mmol/L 141 135 141  Potassium 3.5 - 5.2 mmol/L 3.6 3.9 3.9  Chloride 96 - 106 mmol/L 105 104 108  CO2 20 - 29 mmol/L 22 21 -  Calcium 8.7 - 10.2 mg/dL 9.0 8.7 -  Total Protein 6.0 - 8.5 g/dL 6.3 6.1 -  Total Bilirubin 0.0 - 1.2 mg/dL 7.62(U) 2.6(H) -  Alkaline Phos 44 - 121 IU/L 207(H) 311(H) -  AST 0 - 40 IU/L 57(H) 63(H) -  ALT 0 - 44 IU/L 38 32 -   Lipid Panel     Component Value Date/Time   CHOL 150 06/16/2020 1647   TRIG  65 06/16/2020 1647   HDL 85 06/16/2020 1647   CHOLHDL 1.8 06/16/2020 1647   LDLCALC 52 06/16/2020 1647    CBC    Component Value Date/Time   WBC 6.0 06/16/2020 1647   WBC 5.0 06/09/2019 0910   RBC 4.48 06/16/2020 1647   RBC 3.91 (L) 06/09/2019 0910   HGB 14.9 06/16/2020 1647   HCT 42.6 06/16/2020 1647   PLT 44 (LL) 06/16/2020 1647   MCV 95 06/16/2020 1647   MCH 33.3 (H) 06/16/2020 1647   MCH 33.8 06/09/2019 0910   MCHC 35.0 06/16/2020 1647   MCHC 33.7 06/09/2019 0910   RDW 13.9 06/16/2020 1647   LYMPHSABS 1.6 05/07/2019 1413   MONOABS 0.8 05/19/2018 1020   EOSABS 0.1 05/07/2019 1413   BASOSABS 0.0 05/07/2019 1413    ASSESSMENT AND PLAN: 1. Gross hematuria -Urine dipstick today with small amount of blood.  Differential diagnosis include kidney stone versus UTI versus bladder lesion.  Doubt kidney stone as the pain in the lower back is in the lumbar region and midline with no radiation.  We will send the urine for microscopy, cytology to check for STI and will refer to urology - POCT URINALYSIS DIP (CLINITEK) - Urinalysis, Routine w reflex microscopic - Urine cytology ancillary only - Ambulatory referral to check for  2. Acute midline low back pain without sciatica Likely musculoskeletal.  Doubt kidney stone.  Recommend Voltaren gel as  needed.  3. Alcoholic cirrhosis of liver without ascites (HCC) - spironolactone (ALDACTONE) 100 MG tablet; TAKE 1 TABLET (100 MG TOTAL) BY MOUTH DAILY.  Dispense: 30 tablet; Refill: 2 - lactulose, encephalopathy, (CHRONULAC) 10 GM/15ML SOLN; TAKE 30 MLS (20 G TOTAL) BY MOUTH 2 (TWO) TIMES DAILY.  Dispense: 1800 mL; Refill: 1    Patient was given the opportunity to ask questions.  Patient verbalized understanding of the plan and was able to repeat key elements of the plan.  [employer] Language interpreter used during this encounter. #[id number], Lynnell Chad  Orders Placed This Encounter  Procedures  . Urinalysis, Routine w reflex microscopic  . Ambulatory referral to Urology  . POCT URINALYSIS DIP (CLINITEK)     Requested Prescriptions   Signed Prescriptions Disp Refills  . diclofenac Sodium (VOLTAREN) 1 % GEL 100 g 0    Sig: Apply 2 g topically 4 (four) times daily. To lower back  . spironolactone (ALDACTONE) 100 MG tablet 30 tablet 2    Sig: TAKE 1 TABLET (100 MG TOTAL) BY MOUTH DAILY.  Marland Kitchen lactulose, encephalopathy, (CHRONULAC) 10 GM/15ML SOLN 1800 mL 1    Sig: TAKE 30 MLS (20 G TOTAL) BY MOUTH 2 (TWO) TIMES DAILY.    No follow-ups on file.  Jonah Blue, MD, FACP

## 2020-09-19 NOTE — Telephone Encounter (Signed)
Patient states he started seeing blood in urine yesterday. He reports some burning and back pain as well. Appointment has been scheduled.  Reason for Disposition . [1] Pain or burning with passing urine AND [2] side (flank) or back pain present  Answer Assessment - Initial Assessment Questions 1. COLOR of URINE: "Describe the color of the urine."  (e.g., tea-colored, pink, red, blood clots, bloody)     red 2. ONSET: "When did the bleeding start?"      yesterday 3. EPISODES: "How many times has there been blood in the urine?" or "How many times today?"     3- every time, once  4. PAIN with URINATION: "Is there any pain with passing your urine?" If Yes, ask: "How bad is the pain?"  (Scale 1-10; or mild, moderate, severe)    - MILD - complains slightly about urination hurting    - MODERATE - interferes with normal activities      - SEVERE - excruciating, unwilling or unable to urinate because of the pain      Yes-burning with urination, mild 5. FEVER: "Do you have a fever?" If Yes, ask: "What is your temperature, how was it measured, and when did it start?"     no 6. ASSOCIATED SYMPTOMS: "Are you passing urine more frequently than usual?"     no 7. OTHER SYMPTOMS: "Do you have any other symptoms?" (e.g., back/flank pain, abdominal pain, vomiting)     Back pain 8. PREGNANCY: "Is there any chance you are pregnant?" "When was your last menstrual period?"     n/a  Protocols used: URINE - BLOOD IN-A-AH

## 2020-09-20 LAB — URINALYSIS, ROUTINE W REFLEX MICROSCOPIC
Bilirubin, UA: NEGATIVE
Glucose, UA: NEGATIVE
Ketones, UA: NEGATIVE
Leukocytes,UA: NEGATIVE
Nitrite, UA: NEGATIVE
Protein,UA: NEGATIVE
Specific Gravity, UA: 1.018 (ref 1.005–1.030)
Urobilinogen, Ur: 2 mg/dL — ABNORMAL HIGH (ref 0.2–1.0)
pH, UA: 7.5 (ref 5.0–7.5)

## 2020-09-20 LAB — MICROSCOPIC EXAMINATION
Bacteria, UA: NONE SEEN
Casts: NONE SEEN /lpf
Epithelial Cells (non renal): NONE SEEN /hpf (ref 0–10)

## 2020-09-21 ENCOUNTER — Other Ambulatory Visit: Payer: Self-pay | Admitting: Internal Medicine

## 2020-09-21 DIAGNOSIS — R310 Gross hematuria: Secondary | ICD-10-CM

## 2020-09-21 LAB — URINE CYTOLOGY ANCILLARY ONLY
Chlamydia: NEGATIVE
Comment: NEGATIVE
Comment: NEGATIVE
Comment: NORMAL
Neisseria Gonorrhea: NEGATIVE
Trichomonas: NEGATIVE

## 2020-09-21 NOTE — Progress Notes (Signed)
Let patient know that the urinalysis does show some blood in the urine along with some crystals.  This may indicate that he has kidney stones.  I have ordered a CAT scan of the abdomen to evaluate for kidney stones.  Please schedule for patient.

## 2020-09-23 ENCOUNTER — Telehealth: Payer: Self-pay

## 2020-09-23 NOTE — Telephone Encounter (Signed)
My Chart

## 2020-09-30 ENCOUNTER — Other Ambulatory Visit: Payer: Self-pay

## 2020-09-30 ENCOUNTER — Ambulatory Visit (HOSPITAL_COMMUNITY)
Admission: RE | Admit: 2020-09-30 | Discharge: 2020-09-30 | Disposition: A | Discharge status: Home / Self Care | Payer: 59 | Source: Ambulatory Visit | Attending: Internal Medicine | Admitting: Internal Medicine

## 2020-09-30 DIAGNOSIS — R310 Gross hematuria: Secondary | ICD-10-CM | POA: Diagnosis not present

## 2020-10-07 ENCOUNTER — Ambulatory Visit
Admission: RE | Admit: 2020-10-07 | Discharge: 2020-10-07 | Disposition: A | Discharge status: Home / Self Care | Payer: 59 | Source: Ambulatory Visit | Attending: Nurse Practitioner | Admitting: Nurse Practitioner

## 2020-10-07 ENCOUNTER — Other Ambulatory Visit: Payer: Self-pay

## 2020-10-07 ENCOUNTER — Other Ambulatory Visit: Payer: Self-pay | Admitting: Internal Medicine

## 2020-10-07 DIAGNOSIS — K7031 Alcoholic cirrhosis of liver with ascites: Secondary | ICD-10-CM

## 2020-10-07 DIAGNOSIS — K769 Liver disease, unspecified: Secondary | ICD-10-CM

## 2020-10-07 DIAGNOSIS — K7030 Alcoholic cirrhosis of liver without ascites: Secondary | ICD-10-CM

## 2020-10-07 MED ORDER — ONDANSETRON HCL 4 MG PO TABS
ORAL_TABLET | ORAL | 0 refills | Status: DC
  Filled 2020-10-07: qty 18, 21d supply, fill #0
  Filled 2020-11-10: qty 12, 3d supply, fill #1

## 2020-10-07 MED FILL — Furosemide Tab 40 MG: ORAL | 30 days supply | Qty: 60 | Fill #1 | Status: AC

## 2020-10-07 MED FILL — Potassium Chloride Microencapsulated Crys ER Tab 20 mEq: ORAL | 30 days supply | Qty: 60 | Fill #1 | Status: AC

## 2020-10-07 NOTE — Telephone Encounter (Signed)
  Notes to clinic; this refill cannot be delegated     Requested Prescriptions  Pending Prescriptions Disp Refills   ondansetron (ZOFRAN) 4 MG tablet 30 tablet 0    Sig: TAKE 1 TABLET (4 MG TOTAL) BY MOUTH EVERY 6 (SIX) HOURS AS NEEDED FOR NAUSEA OR VOMITING.      Not Delegated - Gastroenterology: Antiemetics Failed - 10/07/2020 10:12 AM      Failed - This refill cannot be delegated      Passed - Valid encounter within last 6 months    Recent Outpatient Visits           2 weeks ago Gross hematuria   [place] [facility] B, MD   3 months ago Alcoholic cirrhosis of liver without ascites [facility])   [place] [facility] Marcine Matar, MD   1 year ago Decompensated hepatic cirrhosis [facility])   [place] [facility] Marcine Matar, MD   1 year ago Ankle fracture, left, sequela   [place] [facility] Marcine Matar, MD   1 year ago Nonintractable headache, unspecified chronicity pattern, unspecified headache type   [facility] [place], [place], [place]

## 2020-11-10 ENCOUNTER — Other Ambulatory Visit: Payer: Self-pay

## 2020-11-10 MED ORDER — SULFAMETHOXAZOLE-TRIMETHOPRIM 800-160 MG PO TABS
ORAL_TABLET | ORAL | 0 refills | Status: DC
  Filled 2020-11-10: qty 60, 30d supply, fill #0

## 2020-11-10 MED FILL — Potassium Chloride Microencapsulated Crys ER Tab 20 mEq: ORAL | 30 days supply | Qty: 60 | Fill #2 | Status: AC

## 2020-11-10 MED FILL — Furosemide Tab 40 MG: ORAL | 30 days supply | Qty: 60 | Fill #2 | Status: AC

## 2020-12-13 ENCOUNTER — Other Ambulatory Visit: Payer: Self-pay | Admitting: Internal Medicine

## 2020-12-13 ENCOUNTER — Other Ambulatory Visit: Payer: Self-pay

## 2020-12-13 DIAGNOSIS — K7031 Alcoholic cirrhosis of liver with ascites: Secondary | ICD-10-CM

## 2020-12-13 DIAGNOSIS — K7030 Alcoholic cirrhosis of liver without ascites: Secondary | ICD-10-CM

## 2020-12-13 MED ORDER — LACTULOSE ENCEPHALOPATHY 10 GM/15ML PO SOLN
ORAL | 1 refills | Status: DC
  Filled 2020-12-13: qty 1800, 30d supply, fill #0
  Filled 2021-01-23: qty 1800, 30d supply, fill #1

## 2020-12-13 MED FILL — Potassium Chloride Microencapsulated Crys ER Tab 20 mEq: ORAL | 30 days supply | Qty: 60 | Fill #3 | Status: AC

## 2020-12-13 MED FILL — Furosemide Tab 40 MG: ORAL | 30 days supply | Qty: 60 | Fill #3 | Status: AC

## 2020-12-13 NOTE — Telephone Encounter (Signed)
Requested medications are due for refill today.  yes  Requested medications are on the active medications list.  yes  Last refill. 08/25/2020  Future visit scheduled.   no  Notes to clinic.  Medication was prescribed by Dr. Barron Alvine.

## 2020-12-13 NOTE — Telephone Encounter (Signed)
Requested medications are due for refill today.  yes  Requested medications are on the active medications list.  yes  Last refill. 10/07/2020  Future visit scheduled.   no  Notes to clinic.  Medication not delegated.

## 2020-12-13 NOTE — Telephone Encounter (Signed)
Requested Prescriptions  Pending Prescriptions Disp Refills  . lactulose, encephalopathy, (CHRONULAC) 10 GM/15ML SOLN 1800 mL 1    Sig: TAKE 30 MLS (20 G TOTAL) BY MOUTH 2 (TWO) TIMES DAILY.     Gastroenterology:  Laxatives Passed - 12/13/2020  4:54 PM      Passed - Valid encounter within last 12 months    Recent Outpatient Visits          2 months ago Gross hematuria   [place] [facility] Jonah Blue B, MD   6 months ago Alcoholic cirrhosis of liver without ascites [facility])   [facility] Marcine Matar, MD   1 year ago Decompensated hepatic cirrhosis [facility])   [place] [facility] Marcine Matar, MD   1 year ago Ankle fracture, left, sequela   [place] [facility] Marcine Matar, MD   1 year ago Nonintractable headache, unspecified chronicity pattern, unspecified headache type   [facility] [place], [place], [place]             . ondansetron (ZOFRAN) 4 MG tablet 30 tablet 0    Sig: TAKE 1 TABLET (4 MG TOTAL) BY MOUTH EVERY 6 (SIX) HOURS AS NEEDED FOR NAUSEA OR VOMITING.     Not Delegated - Gastroenterology: Antiemetics Failed - 12/13/2020  4:54 PM      Failed - This refill cannot be delegated      Passed - Valid encounter within last 6 months    Recent Outpatient Visits          2 months ago Gross hematuria   [place] [facility] Jonah Blue B, MD   6 months ago Alcoholic cirrhosis of liver without ascites [facility])   [place] [facility] Marcine Matar, MD   1 year ago Decompensated hepatic cirrhosis [facility])   [place] [facility] Marcine Matar, MD   1 year ago Ankle fracture, left, sequela   [place] [facility] Marcine Matar, MD   1 year ago Nonintractable headache, unspecified chronicity pattern, unspecified headache type   [facility]ness [place], [place], [place]

## 2020-12-14 ENCOUNTER — Other Ambulatory Visit: Payer: Self-pay

## 2020-12-14 MED ORDER — ONDANSETRON HCL 4 MG PO TABS
4.0000 mg | ORAL_TABLET | Freq: Four times a day (QID) | ORAL | 0 refills | Status: DC | PRN
Start: 2020-12-14 — End: 2021-02-24
  Filled 2020-12-14: qty 30, 8d supply, fill #0

## 2020-12-14 MED ORDER — FOLIC ACID 1 MG PO TABS
1.0000 mg | ORAL_TABLET | Freq: Every day | ORAL | 2 refills | Status: DC
  Filled 2020-12-14: qty 30, 30d supply, fill #0
  Filled 2021-01-18: qty 30, 30d supply, fill #1
  Filled 2021-02-22: qty 30, 30d supply, fill #2

## 2020-12-28 ENCOUNTER — Encounter: Payer: Self-pay | Admitting: Internal Medicine

## 2020-12-28 NOTE — Progress Notes (Signed)
Patient seen at [facility] urology on 12/16/2020 for further work-up of microscopic hematuria.  He had a CT scan renal stone protocol on 09/30/2020.  This revealed bilateral small nonobstructing calculi measuring up to 4 mm in the mid left renal collecting system.  No hydronephrosis or ureteral calculi seen.  The prostate was unremarkable.  Patient had negative cystoscopy on 12/16/2020 Dr. Modena Slater.

## 2021-01-18 ENCOUNTER — Other Ambulatory Visit: Payer: Self-pay

## 2021-01-18 ENCOUNTER — Other Ambulatory Visit: Payer: Self-pay | Admitting: Internal Medicine

## 2021-01-18 DIAGNOSIS — K7031 Alcoholic cirrhosis of liver with ascites: Secondary | ICD-10-CM

## 2021-01-18 DIAGNOSIS — K7030 Alcoholic cirrhosis of liver without ascites: Secondary | ICD-10-CM

## 2021-01-18 MED ORDER — SPIRONOLACTONE 100 MG PO TABS
ORAL_TABLET | Freq: Every day | ORAL | 2 refills | Status: DC
  Filled 2021-01-18: qty 30, 30d supply, fill #0
  Filled 2021-02-22: qty 30, 30d supply, fill #1
  Filled 2021-04-03: qty 30, 30d supply, fill #2

## 2021-01-18 MED ORDER — POTASSIUM CHLORIDE CRYS ER 20 MEQ PO TBCR
EXTENDED_RELEASE_TABLET | Freq: Two times a day (BID) | ORAL | 2 refills | Status: DC
  Filled 2021-01-18: qty 60, 30d supply, fill #0
  Filled 2021-02-22: qty 60, 30d supply, fill #1
  Filled 2021-04-03: qty 60, 30d supply, fill #2

## 2021-01-18 MED FILL — Furosemide Tab 40 MG: ORAL | 30 days supply | Qty: 60 | Fill #4 | Status: AC

## 2021-01-18 NOTE — Telephone Encounter (Signed)
Requested medication (s) are due for refill today - yes  Requested medication (s) are on the active medication list -yes  Future visit scheduled -no  Last refill: 12/14/20 #30  Notes to clinic: Request RF: non delegated Rx  Requested Prescriptions  Pending Prescriptions Disp Refills   ondansetron (ZOFRAN) 4 MG tablet 30 tablet 0    Sig: Take 1 tablet (4 mg total) by mouth every 6 (six) hours as needed for nausea or vomiting.     Not Delegated - Gastroenterology: Antiemetics Failed - 01/18/2021  3:20 PM      Failed - This refill cannot be delegated      Passed - Valid encounter within last 6 months    Recent Outpatient Visits           4 months ago Gross hematuria   [place] [facility] Jonah Blue B, MD   7 months ago Alcoholic cirrhosis of liver without ascites [facility])   [place] [facility] Marcine Matar, MD   1 year ago Decompensated hepatic cirrhosis [facility])   [place] [facility] Marcine Matar, MD   1 year ago Ankle fracture, left, sequela   [place] [facility] Marcine Matar, MD   1 year ago Nonintractable headache, unspecified chronicity pattern, unspecified headache type   [facility] [place], [place], [place]              Signed Prescriptions Disp Refills   potassium chloride SA (KLOR-CON) 20 MEQ tablet 60 tablet 2    Sig: TAKE 1 TABLET (20 MEQ TOTAL) BY MOUTH 2 (TWO) TIMES DAILY.     Endocrinology:  Minerals - Potassium Supplementation Failed - 01/18/2021  3:20 PM      Failed - Cr in normal range and within 360 days    Creatinine, Ser  Date Value Ref Range Status  06/16/2020 0.64 (L) 0.76 - 1.27 mg/dL Final          Passed - K in normal range and within 360 days    Potassium  Date Value Ref Range Status  06/16/2020 3.6 3.5 - 5.2 mmol/L Final          Passed - Valid encounter within last 12 months    Recent Outpatient Visits            4 months ago Gross hematuria   [place] [facility] Jonah Blue B, MD   7 months ago Alcoholic cirrhosis of liver without ascites [facility])   [place] [facility] Marcine Matar, MD   1 year ago Decompensated hepatic cirrhosis [facility])   [place] [facility] Marcine Matar, MD   1 year ago Ankle fracture, left, sequela   [place] [facility] Jonah Blue B, MD   1 year ago Nonintractable headache, unspecified chronicity pattern, unspecified headache type   [facility] [place], [place], [place]               spironolactone (ALDACTONE) 100 MG tablet 30 tablet 2    Sig: TAKE 1 TABLET (100 MG TOTAL) BY MOUTH DAILY.     Cardiovascular: Diuretics - Aldosterone Antagonist Failed - 01/18/2021  3:20 PM      Failed - Cr in normal range and within 360 days    Creatinine, Ser  Date Value Ref Range Status  06/16/2020 0.64 (L) 0.76 -  1.27 mg/dL Final          Passed - K in normal range and within 360 days    Potassium  Date Value Ref Range Status  06/16/2020 3.6 3.5 - 5.2 mmol/L Final          Passed - Na in normal range and within 360 days    Sodium  Date Value Ref Range Status  06/16/2020 141 134 - 144 mmol/L Final          Passed - Last BP in normal range    BP Readings from Last 1 Encounters:  09/19/20 132/84          Passed - Valid encounter within last 6 months    Recent Outpatient Visits           4 months ago Gross hematuria   [place] [facility] Jonah Blue B, MD   7 months ago Alcoholic cirrhosis of liver without ascites [facility])   [place] [facility] Marcine Matar, MD   1 year ago Decompensated hepatic cirrhosis [facility])   [place] [facility] Marcine Matar, MD   1 year ago Ankle fracture, left, sequela   [place] [facility] Marcine Matar, MD   1 year ago Nonintractable headache, unspecified chronicity pattern, unspecified headache type   [facility] [place], [place], [place]                 Requested Prescriptions  Pending Prescriptions Disp Refills   ondansetron (ZOFRAN) 4 MG tablet 30 tablet 0    Sig: Take 1 tablet (4 mg total) by mouth every 6 (six) hours as needed for nausea or vomiting.     Not Delegated - Gastroenterology: Antiemetics Failed - 01/18/2021  3:20 PM      Failed - This refill cannot be delegated      Passed - Valid encounter within last 6 months    Recent Outpatient Visits           4 months ago Gross hematuria   [place] [facility] Jonah Blue B, MD   7 months ago Alcoholic cirrhosis of liver without ascites [facility])   [place] [facility] Marcine Matar, MD   1 year ago Decompensated hepatic cirrhosis [facility])   [place] Marcine Matar, MD   1 year ago Ankle fracture, left, sequela   [place] [facility] Marcine Matar, MD   1 year ago Nonintractable headache, unspecified chronicity pattern, unspecified headache type   [facility] And Wellness [place], [place], [place]              Signed Prescriptions Disp Refills   potassium chloride SA (KLOR-CON) 20 MEQ tablet 60 tablet 2    Sig: TAKE 1 TABLET (20 MEQ TOTAL) BY MOUTH 2 (TWO) TIMES DAILY.     Endocrinology:  Minerals - Potassium Supplementation Failed - 01/18/2021  3:20 PM      Failed - Cr in normal range and within 360 days    Creatinine, Ser  Date Value Ref Range Status  06/16/2020 0.64 (L) 0.76 - 1.27 mg/dL Final          Passed - K in normal range and within 360 days    Potassium  Date Value Ref Range Status  06/16/2020 3.6 3.5 - 5.2 mmol/L Final  Passed - Valid encounter within last 12 months    Recent Outpatient Visits           4 months ago  Gross hematuria   [place] [facility] Jonah Blue B, MD   7 months ago Alcoholic cirrhosis of liver without ascites [facility])   [place] [facility] Marcine Matar, MD   1 year ago Decompensated hepatic cirrhosis [facility])   [place] [facility] Marcine Matar, MD   1 year ago Ankle fracture, left, sequela   [place] [facility] Marcine Matar, MD   1 year ago Nonintractable headache, unspecified chronicity pattern, unspecified headache type   [facility] And Wellness [place], [place], [place]               spironolactone (ALDACTONE) 100 MG tablet 30 tablet 2    Sig: TAKE 1 TABLET (100 MG TOTAL) BY MOUTH DAILY.     Cardiovascular: Diuretics - Aldosterone Antagonist Failed - 01/18/2021  3:20 PM      Failed - Cr in normal range and within 360 days    Creatinine, Ser  Date Value Ref Range Status  06/16/2020 0.64 (L) 0.76 - 1.27 mg/dL Final          Passed - K in normal range and within 360 days    Potassium  Date Value Ref Range Status  06/16/2020 3.6 3.5 - 5.2 mmol/L Final          Passed - Na in normal range and within 360 days    Sodium  Date Value Ref Range Status  06/16/2020 141 134 - 144 mmol/L Final          Passed - Last BP in normal range    BP Readings from Last 1 Encounters:  09/19/20 132/84          Passed - Valid encounter within last 6 months    Recent Outpatient Visits           4 months ago Gross hematuria   [place] [facility] Jonah Blue B, MD   7 months ago Alcoholic cirrhosis of liver without ascites [facility])   [place] [facility] Marcine Matar, MD   1 year ago Decompensated hepatic cirrhosis [facility] [place] [facility])   [place] [facility] Wellness Marcine Matar, MD   1 year ago Ankle fracture, left, sequela   [place] [facility] And [facility] Marcine Matar, MD   1 year  ago Nonintractable headache, unspecified chronicity pattern, unspecified headache type   [place] [facility] And [facility]ness [place], [place], [place]

## 2021-01-23 ENCOUNTER — Other Ambulatory Visit: Payer: Self-pay

## 2021-01-25 ENCOUNTER — Other Ambulatory Visit: Payer: Self-pay

## 2021-02-06 ENCOUNTER — Ambulatory Visit: Payer: Self-pay | Admitting: *Deleted

## 2021-02-06 NOTE — Telephone Encounter (Signed)
Called pt made aware of scheduled appt / Discussed with pt if symptoms persist or worsening to seek care in the UC or Ed. Verbalized understanding and agreement

## 2021-02-06 NOTE — Telephone Encounter (Signed)
Reason for Disposition  [1] MODERATE pain (e.g., interferes with normal activities, limping) AND [2] present > 3 days  Answer Assessment - Initial Assessment Questions 1. ONSET: "When did the pain start?"      Saturday night 02/04/21 2. LOCATION: "Where is the pain located?"      Right leg from groin to back of knee 3. PAIN: "How bad is the pain?"    (Scale 1-10; or mild, moderate, severe)   -  MILD (1-3): doesn't interfere with normal activities    -  MODERATE (4-7): interferes with normal activities (e.g., work or school) or awakens from sleep, limping    -  SEVERE (8-10): excruciating pain, unable to do any normal activities, unable to walk     Can be severe at times and cause difficulty walking and need to rest before walking again .  4. WORK OR EXERCISE: "Has there been any recent work or exercise that involved this part of the body?"      No  5. CAUSE: "What do you think is causing the leg pain?"     Not sure  6. OTHER SYMPTOMS: "Do you have any other symptoms?" (e.g., chest pain, back pain, breathing difficulty, swelling, rash, fever, numbness, weakness)     No  7. PREGNANCY: "Is there any chance you are pregnant?" "When was your last menstrual period?"     na  Protocols used: Leg Pain-A-AH

## 2021-02-06 NOTE — Telephone Encounter (Signed)
Call received by patient using interpreter [phone]. Patient c/o right leg pain from groin to back of knee since Saturday night 02/04/21. Denies injury or any activity to cause pain. Pain can be severe at times and unable to walk and allows to rest then can walk. Taking advil with some relief but then pain returns . Denies fever, swelling , no abdominal pain or urination difficulty. Denies calf pain or thigh pain. Denies chest pain or difficulty breathing . No available appt unitl 02/22/21. Recommended mobile bus , noted mammogram for mobile bus this week. Recommended to go to [facility] website on computer for televisit appt if needed. Please advise . Instructed patient to go to [facility] or ED for worsening symptoms. Care advise given. Patient verbalized understanding of care advise and to call back or go to [facility] or ED if symptoms worsen or call 911 for chest pain or difficulty breathing.

## 2021-02-06 NOTE — Telephone Encounter (Signed)
Pt needs spanish speaker/interpreter. He has new unexplained pain in his leg, not affecting his mobility, however he feels this when he   Best contact:  ** The message may be incomplete. It was sent as a result of a timeout. **   Patient has already been triaged today by Angie- call closed.

## 2021-02-09 ENCOUNTER — Encounter: Payer: Self-pay | Admitting: Physician Assistant

## 2021-02-09 ENCOUNTER — Other Ambulatory Visit: Payer: Self-pay

## 2021-02-09 ENCOUNTER — Ambulatory Visit: Payer: 59 | Attending: Physician Assistant | Admitting: Physician Assistant

## 2021-02-09 VITALS — BP 128/78 | HR 69 | Temp 98.8°F | Resp 18 | Ht 65.0 in | Wt 232.0 lb

## 2021-02-09 DIAGNOSIS — M79651 Pain in right thigh: Secondary | ICD-10-CM

## 2021-02-09 DIAGNOSIS — R1031 Right lower quadrant pain: Secondary | ICD-10-CM | POA: Diagnosis not present

## 2021-02-09 LAB — POCT URINALYSIS DIP (CLINITEK)
Bilirubin, UA: NEGATIVE
Glucose, UA: NEGATIVE mg/dL
Ketones, POC UA: NEGATIVE mg/dL
Leukocytes, UA: NEGATIVE
Nitrite, UA: NEGATIVE
POC PROTEIN,UA: NEGATIVE
Spec Grav, UA: 1.02 (ref 1.010–1.025)
Urobilinogen, UA: 1 E.U./dL
pH, UA: 7 (ref 5.0–8.0)

## 2021-02-09 MED ORDER — CYCLOBENZAPRINE HCL 10 MG PO TABS
10.0000 mg | ORAL_TABLET | Freq: Three times a day (TID) | ORAL | 0 refills | Status: DC | PRN
  Filled 2021-02-09: qty 30, 10d supply, fill #0

## 2021-02-09 NOTE — Progress Notes (Signed)
Established Patient Office Visit  Subjective:  Patient ID: Jason [name], male    DOB: 1981/03/29  Age: 40 y.o. MRN: [id number]  CC:  Chief Complaint  Patient presents with   Groin Pain     HPI [facility] [place] states that he has been having pain for the last 5 days, states that it is on the right side of his groin area and radiates down to his right knee.  Also endorses that the pain sometimes starts in his right knee and radiates up into his groin area.  When he points he points to the lower area of his groin closer to the top of his thigh.  Denies injury or trauma, lifting anything heavy, denies feeling a bulge or any type of testicular pain.  Reports that he has used Advil with a little relief.   Patient does have a significant history of kidney stones.  Note from recent interaction with primary care provider :  Patient seen at [facility] urology on 12/16/2020 for further work-up of microscopic hematuria.  He had a CT scan renal stone protocol on 09/30/2020.  This revealed bilateral small nonobstructing calculi measuring up to 4 mm in the mid left renal collecting system.  No hydronephrosis or ureteral calculi seen.  The prostate was unremarkable.  Patient had negative cystoscopy on 12/16/2020 Dr. Modena Slater  Patient today does state that he does not feel that this is the same type of pain that he had after he "passed a kidney stone"  Due to language barrier, an interpreter was present during the history-taking and subsequent discussion (and for part of the physical exam) with this patient.    Past Medical History:  Diagnosis Date   Alcoholic cirrhosis of liver (HCC)    Anemia    Ankle fracture    Right   GERD (gastroesophageal reflux disease)    Hypertension    Nocturia    Thrombocytopenia (HCC)     Past Surgical History:  Procedure Laterality Date   BIOPSY  08/10/2019   Procedure: BIOPSY;  Surgeon: Shellia Cleverly, DO;  Location: [facility]  ENDOSCOPY;  Service: Gastroenterology;;   BIOPSY  02/08/2020   Procedure: BIOPSY;  Surgeon: Shellia Cleverly, DO;  Location: [facility] ENDOSCOPY;  Service: Gastroenterology;;   ESOPHAGEAL BANDING  08/10/2019   Procedure: ESOPHAGEAL BANDING;  Surgeon: Shellia Cleverly, DO;  Location: [facility] ENDOSCOPY;  Service: Gastroenterology;;   ESOPHAGOGASTRODUODENOSCOPY (EGD) WITH PROPOFOL N/A 08/10/2019   Procedure: ESOPHAGOGASTRODUODENOSCOPY (EGD) WITH PROPOFOL;  Surgeon: Shellia Cleverly, DO;  Location: [facility] ENDOSCOPY;  Service: Gastroenterology;  Laterality: N/A;   ESOPHAGOGASTRODUODENOSCOPY (EGD) WITH PROPOFOL N/A 02/08/2020   Procedure: ESOPHAGOGASTRODUODENOSCOPY (EGD) WITH PROPOFOL;  Surgeon: Shellia Cleverly, DO;  Location: [facility] ENDOSCOPY;  Service: Gastroenterology;  Laterality: N/A;   FOOT SURGERY Left    around 2014. a scar on lower leg   ORIF ANKLE FRACTURE Right 06/09/2019   Procedure: RIGHT OPEN REDUCTION INTERNAL FIXATION (ORIF) ANKLE FRACTURE;  Surgeon: Sheral Apley, MD;  Location: [facility];  Service: Orthopedics;  Laterality: Right;    Family History  Problem Relation Age of Onset   Colon cancer Neg Hx    Esophageal cancer Neg Hx    Healthy Mother    Healthy Daughter     Social History   Socioeconomic History   Marital status: Legally Separated    Spouse name: Not on file   Number of children: Not on file   Years of education: Not on  file   Highest education level: Not on file  Occupational History   Occupation: Education administrator Conservation officer, historic buildings)  Tobacco Use   Smoking status: Former   Smokeless tobacco: Never   Tobacco comments:    said he has tried it  Building services engineer Use: Never used  Substance and Sexual Activity   Alcohol use: Not Currently    Comment: 03/05/2018   Drug use: Never   Sexual activity: Yes  Other Topics Concern   Not on file  Social History Narrative   ** Merged History Encounter **       Lives in [place] with wife and daughter.     Social Determinants of Health   Financial Resource Strain: Not on file  Food Insecurity: Not on file  Transportation Needs: Not on file  Physical Activity: Not on file  Stress: Not on file  Social Connections: Not on file  Intimate Partner Violence: Not on file    Outpatient Medications Prior to Visit  Medication Sig Dispense Refill   acidophilus (RISAQUAD) CAPS capsule Take 1 capsule by mouth daily. 30 capsule 3   famotidine (PEPCID) 20 MG tablet Take 1 tablet (20 mg total) by mouth daily. 90 tablet 4   folic acid (FOLVITE) 1 MG tablet Take 1 tablet (1 mg total) by mouth daily. 30 tablet 2   Menthol, Topical Analgesic, (ICY HOT EX) Place 1 patch onto the skin daily as needed (pain.).     Multiple Vitamin (MULTIVITAMIN WITH MINERALS) TABS tablet Take 1 tablet by mouth daily. 30 tablet 2   ondansetron (ZOFRAN) 4 MG tablet Take 1 tablet (4 mg total) by mouth every 6 (six) hours as needed for nausea or vomiting. 30 tablet 0   potassium chloride SA (KLOR-CON) 20 MEQ tablet TAKE 1 TABLET (20 MEQ TOTAL) BY MOUTH 2 (TWO) TIMES DAILY. 60 tablet 2   spironolactone (ALDACTONE) 100 MG tablet TAKE 1 TABLET (100 MG TOTAL) BY MOUTH DAILY. 30 tablet 2   thiamine 100 MG tablet Take 1 tablet (100 mg total) by mouth daily. 30 tablet 5   diclofenac Sodium (VOLTAREN) 1 % GEL Apply 2 g topically 4 (four) times daily. To lower back (Patient not taking: Reported on 02/09/2021) 100 g 0   furosemide (LASIX) 40 MG tablet TAKE 1 TABLET (40 MG TOTAL) BY MOUTH 2 (TWO) TIMES DAILY. (Patient not taking: Reported on 02/09/2021) 60 tablet 5   lactulose, encephalopathy, (CHRONULAC) 10 GM/15ML SOLN TAKE 30 MLS (20 G TOTAL) BY MOUTH 2 (TWO) TIMES DAILY. (Patient not taking: Reported on 02/09/2021) 1800 mL 1   sulfamethoxazole-trimethoprim (BACTRIM DS) 800-160 MG tablet Take 1 tablet by mouth twice daily 60 tablet 0   No facility-administered medications prior to visit.    No Known Allergies  ROS Review of Systems   Constitutional:  Negative for chills and fever.  HENT: Negative.    Eyes: Negative.   Respiratory:  Negative for shortness of breath.   Cardiovascular:  Negative for chest pain.  Gastrointestinal:  Negative for abdominal pain, blood in stool, constipation, diarrhea, nausea, rectal pain and vomiting.  Endocrine: Negative.   Genitourinary:  Negative for dysuria, hematuria and testicular pain.  Musculoskeletal:  Positive for arthralgias and myalgias.  Skin: Negative.   Allergic/Immunologic: Negative.   Neurological:  Negative for headaches.  Hematological: Negative.   Psychiatric/Behavioral: Negative.       Objective:    Physical Exam Vitals and nursing note reviewed. Exam conducted with a chaperone present.  Constitutional:  General: He is not in acute distress.    Appearance: Normal appearance. He is obese. He is not ill-appearing.  HENT:     Head: Normocephalic and atraumatic.     Right Ear: External ear normal.     Left Ear: External ear normal.     Nose: Nose normal.     Mouth/Throat:     Mouth: Mucous membranes are moist.     Pharynx: Oropharynx is clear.  Eyes:     Extraocular Movements: Extraocular movements intact.     Conjunctiva/sclera: Conjunctivae normal.     Pupils: Pupils are equal, round, and reactive to light.  Cardiovascular:     Rate and Rhythm: Normal rate and regular rhythm.     Pulses: Normal pulses.     Heart sounds: Normal heart sounds.  Pulmonary:     Effort: Pulmonary effort is normal.     Breath sounds: Normal breath sounds.  Abdominal:     General: Abdomen is flat.     Palpations: Abdomen is soft.     Tenderness: There is no abdominal tenderness.     Hernia: There is no hernia in the left inguinal area, right femoral area, left femoral area or right inguinal area.  Genitourinary:    Penis: Normal.      Testes: Normal.        Right: Swelling not present.        Left: Swelling not present.  Musculoskeletal:        General: Normal range  of motion.     Cervical back: Normal range of motion and neck supple.  Skin:    General: Skin is warm and dry.  Neurological:     General: No focal deficit present.     Mental Status: He is alert and oriented to person, place, and time.  Psychiatric:        Mood and Affect: Mood normal.        Behavior: Behavior normal.        Thought Content: Thought content normal.        Judgment: Judgment normal.    BP 128/78 (BP Location: Right Arm, Patient Position: Sitting, Cuff Size: Large)   Pulse 69   Temp 98.8 F (37.1 C) (Oral)   Resp 18   Ht 5\' 5"  (1.651 m)   Wt 232 lb (105.2 kg)   SpO2 99%   BMI 38.61 kg/m  Wt Readings from Last 3 Encounters:  02/09/21 232 lb (105.2 kg)  09/19/20 227 lb 9.6 oz (103.2 kg)  06/16/20 219 lb (99.3 kg)     Health Maintenance Due  Topic Date Due   TETANUS/TDAP  Never done   INFLUENZA VACCINE  11/28/2020    There are no preventive care reminders to display for this patient.  Lab Results  Component Value Date   TSH 0.708 05/07/2019   Lab Results  Component Value Date   WBC 6.0 06/16/2020   HGB 14.9 06/16/2020   HCT 42.6 06/16/2020   MCV 95 06/16/2020   PLT 44 (LL) 06/16/2020   Lab Results  Component Value Date   NA 141 06/16/2020   K 3.6 06/16/2020   CO2 22 06/16/2020   GLUCOSE 79 06/16/2020   BUN 8 06/16/2020   CREATININE 0.64 (L) 06/16/2020   BILITOT 1.9 (H) 06/16/2020   ALKPHOS 207 (H) 06/16/2020   AST 57 (H) 06/16/2020   ALT 38 06/16/2020   PROT 6.3 06/16/2020   ALBUMIN 3.8 (L) 06/16/2020   CALCIUM 9.0 06/16/2020  ANIONGAP 8 12/05/2018   GFR 114.50 12/08/2018   Lab Results  Component Value Date   CHOL 150 06/16/2020   Lab Results  Component Value Date   HDL 85 06/16/2020   Lab Results  Component Value Date   LDLCALC 52 06/16/2020   Lab Results  Component Value Date   TRIG 65 06/16/2020   Lab Results  Component Value Date   CHOLHDL 1.8 06/16/2020   Lab Results  Component Value Date   HGBA1C 4.6 (L)  06/16/2020      Assessment & Plan:   Problem List Items Addressed This Visit   None Visit Diagnoses     Groin pain, right    -  Primary   Relevant Orders   POCT URINALYSIS DIP (CLINITEK) (Completed)   Acute pain of right thigh       Relevant Medications   cyclobenzaprine (FLEXERIL) 10 MG tablet   Other Relevant Orders   Basic Metabolic Panel       Meds ordered this encounter  Medications   cyclobenzaprine (FLEXERIL) 10 MG tablet    Sig: Take 1 tablet (10 mg total) by mouth 3 (three) times daily as needed for muscle spasms.    Dispense:  30 tablet    Refill:  0    Order Specific Question:   Supervising Provider    Answer:   Delford Field, PATRICK E [[id number]]   1. Groin pain, right UA negative for urinary tract infection, no physical finding of hernia, no CVA tenderness.  Trial Flexeril, patient education given on supportive care.  Patient to follow-up with this provider in 2 weeks.  Red flags given for prompt reevaluation. - POCT URINALYSIS DIP (CLINITEK)  2. Acute pain of right thigh  - Basic Metabolic Panel - cyclobenzaprine (FLEXERIL) 10 MG tablet; Take 1 tablet (10 mg total) by mouth 3 (three) times daily as needed for muscle spasms.  Dispense: 30 tablet; Refill: 0    I have reviewed the patient's medical history (PMH, PSH, Social History, Family History, Medications, and allergies) , and have been updated if relevant. I spent 30 minutes reviewing chart and  face to face time with patient.   Follow-up: Return in about 2 weeks (around 02/23/2021).    Kasandra Knudsen Mayers, PA-C

## 2021-02-09 NOTE — Patient Instructions (Signed)
You will use Flexeril every 8 hours as needed to help you with your pain.  I encourage you to do gentle stretching, and get plenty of rest.  We will call you with today's lab results.  Roney Jaffe, PA-C Physician Assistant [facility] [email or web address]   Distensin inguinal Adductor Muscle Strain Una distensin inguinal, tambin llamada distensin o tirn en la ingle, es una lesin en los msculos o los tendones de la parte superior interna del muslo. Estos msculos se llaman aductores o msculos de la ingle. Son los que permiten mover las piernas cruzando el cuerpo o juntar las piernas. Una distensin muscular se produce cuando un msculo se estira demasiado y algunas fibras musculares se rompen. Una distensin inguinal puede variar de leve a grave, segn la cantidad de fibras musculares afectadas y si estas se han desgarrado parcial o totalmente. Cules son las causas? Las distensiones inguinales generalmente ocurren al hacer ejercicio o al practicar deportes. La lesin ocurre cuando se ejerce una fuerza violenta y sbita en un msculo, y el msculo se extiende demasiado. Tambin hay ms probabilidades de que suceda cuando no se hace precalentamiento de los msculos o si usted no est en condiciones adecuadas. Esta lesin puede deberse a lo siguiente: Event organiser o muy rpido los msculos aductores, generalmente durante un movimiento de lado a lado, con un cambio repentino de direccin. Sobrecargar repetidamente los msculos aductores durante un tiempo prolongado. Realizar actividades vigorosas sin antes Freight forwarder los msculos aductores. Cules son los signos o los sntomas? Los sntomas de esta afeccin incluyen los siguientes: Dolor y sensibilidad en el rea inguinal. Comienza como un dolor agudo y persiste como un dolor sordo. Sensacin de crujido o chasquido cuando se produce la lesin (en caso de las  distensiones graves). Hinchazn o moretones. Espasmos musculares. Debilidad en las piernas. Rigidez en la zona de la ingle, con disminucin de la capacidad de mover los msculos afectados. Cmo se diagnostica? Esta afeccin se puede diagnosticar en funcin de lo siguiente: Sus sntomas y la descripcin de cmo se produjo la lesin. Un examen fsico. Pruebas de diagnstico por imgenes, por ejemplo: Radiografas. En algunos casos son necesarias para descartar una fractura sea o problemas en el cartlago. Ecografa, exploracin por tomografa computarizada (TC) o resonancia magntica (RM). Estos estudios pueden realizarse si el mdico sospecha que el msculo est desgarrado totalmente o si necesita determinar la presencia de otras lesiones. Cmo se trata? Una distensin inguinal se curar por s sola. Si es necesario, el tratamiento para esta afeccin puede ser: [facility] La sigla PRHCE corresponde a proteccin de la zona lesionada, reposo, hielo, presin (compresin) y elevacin. Medicamentos para Primary school teacher y la hinchazn (antiinflamatorios). Muletas. Es posible que le indiquen que los utilice durante los primeros [address]  [address] para Forensic scientist. Segn la gravedad de la distensin inguinal, el tiempo de recuperacin puede variar de unas pocas semanas a varios meses. Las lesiones graves requieren 4 a 6 semanas para su recuperacin. En estos casos, la curacin completa puede demorar de 4 a 5 meses. Siga estas indicaciones en su casa: Tratamiento de PRHCE  Proteja al msculo de nuevas lesiones. Reposo. No use el msculo lesionado si siente dolor. Si se lo indican, aplique hielo sobre la zona de la lesin: Ponga el hielo en una bolsa plstica. Coloque una toalla entre la piel y Copy. Coloque el hielo durante 20 minutos, 2 a 3 veces por da. Hgalo durante los [employer] despus de la lesin.  Comprima la zona lesionada con una venda elstica como se lo haya indicado  el mdico. Cuando est sentado o acostado, levante (eleve) la zona lesionada por encima del nivel del corazn. Indicaciones generales [employer] de venta libre y los recetados solamente como se lo haya indicado el mdico. Cam[place], elongue y haga ejercicio como se lo haya indicado el mdico. Realice estas actividades solo si puede hacerlas sin Financial risk analyst. Siga el plan de tratamiento como se lo haya indicado el mdico. Esto puede incluir: Fisioterapia. Masajes. Estimulacin elctrica local (estimulacin nerviosa elctrica transcutnea, NET). Cmo se evita? Precaliente y elongue adecuadamente antes de hacer actividad fsica. Reljese y elongue despus de hacer actividad fsica. Dele al cuerpo tiempo para [employer] perodos de [place] fsica. Asegrese de usar el equipo que sea apto para usted. Tome medidas de seguridad y sea responsable al hacer [place], para evitar los resbalones y las cadas. Mantenga un buen estado fsico, que incluye lo siguiente: Acondicionamiento adecuado de los [employer] [place], flexibilidad y resistencia general. Comunquese con un mdico si: Siente un dolor cada vez ms intenso o presenta hinchazn en la zona afectada. Los sntomas no mejoran o empeoran. Resumen Una distensin inguinal, tambin llamada distensin o tirn en la ingle, es una lesin en los msculos o los tendones de la parte superior interna del muslo. Una distensin muscular se produce cuando un msculo se estira demasiado y algunas fibras musculares se rompen. Segn la gravedad de la distensin inguinal, el tiempo de recuperacin puede variar de unas pocas semanas a varios meses. Esta informacin no tiene Theme park manager el consejo del mdico. Asegrese de hacerle al mdico cualquier pregunta que tenga. Document Revised: 10/18/2017 Document Reviewed: 10/18/2017 Elsevier Patient Education  2022 [employer].

## 2021-02-09 NOTE — Progress Notes (Signed)
Patient reports pain in the right groin for 6 days. Patient reports pain increased throughout the day. Patient reports similar pain years ago. Patient denies pain with urination and BM. Patient has eaten and taken medication today.

## 2021-02-10 LAB — BASIC METABOLIC PANEL
BUN/Creatinine Ratio: 13 (ref 9–20)
BUN: 8 mg/dL (ref 6–20)
CO2: 20 mmol/L (ref 20–29)
Calcium: 9.2 mg/dL (ref 8.7–10.2)
Chloride: 108 mmol/L — ABNORMAL HIGH (ref 96–106)
Creatinine, Ser: 0.61 mg/dL — ABNORMAL LOW (ref 0.76–1.27)
Glucose: 99 mg/dL (ref 70–99)
Potassium: 3.9 mmol/L (ref 3.5–5.2)
Sodium: 144 mmol/L (ref 134–144)
eGFR: 127 mL/min/{1.73_m2} (ref >59)

## 2021-02-22 ENCOUNTER — Other Ambulatory Visit: Payer: Self-pay

## 2021-02-22 ENCOUNTER — Encounter: Payer: Self-pay | Admitting: Physician Assistant

## 2021-02-22 ENCOUNTER — Ambulatory Visit: Payer: 59 | Attending: Physician Assistant | Admitting: Physician Assistant

## 2021-02-22 VITALS — BP 133/84 | HR 70 | Temp 98.7°F | Resp 18 | Ht 65.0 in | Wt 235.0 lb

## 2021-02-22 DIAGNOSIS — M79672 Pain in left foot: Secondary | ICD-10-CM

## 2021-02-22 DIAGNOSIS — L84 Corns and callosities: Secondary | ICD-10-CM

## 2021-02-22 DIAGNOSIS — M79651 Pain in right thigh: Secondary | ICD-10-CM | POA: Diagnosis not present

## 2021-02-22 NOTE — Progress Notes (Signed)
Patient has eaten today. Patient has not taken any medication today. Patient denies any pain or discomfort at this time. Patient is aware of blood work results.

## 2021-02-22 NOTE — Progress Notes (Signed)
Patient informed during follow up

## 2021-02-22 NOTE — Progress Notes (Signed)
Established Patient Office Visit  Subjective:  Patient ID: Jason [name], male    DOB: 1980-11-03  Age: 40 y.o. MRN: [id number]  CC:  Chief Complaint  Patient presents with   Follow-up    HPI [place] today that the groin pain has greatly improved but is still present.  States that he has used the flexeril but states it made him sleepy and nauseas so he has stopped using it and has been stretching and resting the leg with relief .  States today that he has a callus on the bottom of his left foot, has been present for the past  8 years  Does cause discomfort has tried shaving it without relief.  Due to language barrier, an interpreter was present during the history-taking and subsequent discussion (and for part of the physical exam) with this patient.   Past Medical History:  Diagnosis Date   Alcoholic cirrhosis of liver ([place])    Anemia    Ankle fracture    Right   GERD (gastroesophageal reflux disease)    Hypertension    Nocturia    Thrombocytopenia ([place])     Past Surgical History:  Procedure Laterality Date   BIOPSY  08/10/2019   Procedure: BIOPSY;  Surgeon: Lavena Bullion, DO;  Location: [facility];  Service: Gastroenterology;;   BIOPSY  02/08/2020   Procedure: BIOPSY;  Surgeon: Lavena Bullion, DO;  [facility]: [facility];  Service: Gastroenterology;;   ESOPHAGEAL BANDING  08/10/2019   Procedure: ESOPHAGEAL BANDING;  Surgeon: Lavena Bullion, DO;  [facility];  Service: Gastroenterology;;   ESOPHAGOGASTRODUODENOSCOPY (EGD) WITH PROPOFOL N/A 08/10/2019   Procedure: ESOPHAGOGASTRODUODENOSCOPY (EGD) WITH PROPOFOL;  Surgeon: Lavena Bullion, DO;  [facility];  Service: Gastroenterology;  Laterality: N/A;   ESOPHAGOGASTRODUODENOSCOPY (EGD) WITH PROPOFOL N/A 02/08/2020   Procedure: ESOPHAGOGASTRODUODENOSCOPY (EGD) WITH PROPOFOL;  Surgeon: Lavena Bullion, DO;  [facility];   Service: Gastroenterology;  Laterality: N/A;   FOOT SURGERY Left    around 2014. a scar on lower leg   ORIF ANKLE FRACTURE Right 06/09/2019   Procedure: RIGHT OPEN REDUCTION INTERNAL FIXATION (ORIF) ANKLE FRACTURE;  Surgeon: Renette Butters, MD;  Location: [facility];  Service: Orthopedics;  Laterality: Right;    Family History  Problem Relation Age of Onset   Colon cancer Neg Hx    Esophageal cancer Neg Hx    Healthy Mother    Healthy Daughter     Social History   Socioeconomic History   Marital status: Legally Separated    Spouse name: Not on file   Number of children: Not on file   Years of education: Not on file   Highest education level: Not on file  Occupational History   Occupation: Curator Engineer, materials)  Tobacco Use   Smoking status: Former   Smokeless tobacco: Never   Tobacco comments:    said he has tried it  Scientific laboratory technician Use: Never used  Substance and Sexual Activity   Alcohol use: Not Currently    Comment: 03/05/2018   Drug use: Never   Sexual activity: Yes  Other Topics Concern   Not on file  Social History Narrative   ** Merged History Encounter **       Lives in [place] with wife and daughter.    Social Determinants of Health   Financial Resource Strain: Not on file  Food Insecurity: Not on file  Transportation Needs: Not on  file  Physical Activity: Not on file  Stress: Not on file  Social Connections: Not on file  Intimate Partner Violence: Not on file    Outpatient Medications Prior to Visit  Medication Sig Dispense Refill   acidophilus (RISAQUAD) CAPS capsule Take 1 capsule by mouth daily. 30 capsule 3   cyclobenzaprine (FLEXERIL) 10 MG tablet Take 1 tablet (10 mg total) by mouth 3 (three) times daily as needed for muscle spasms. 30 tablet 0   famotidine (PEPCID) 20 MG tablet Take 1 tablet (20 mg total) by mouth daily. 90 tablet 4   folic acid (FOLVITE) 1 MG tablet Take 1 tablet (1 mg total) by mouth daily. 30  tablet 2   Menthol, Topical Analgesic, (ICY HOT EX) Place 1 patch onto the skin daily as needed (pain.).     Multiple Vitamin (MULTIVITAMIN WITH MINERALS) TABS tablet Take 1 tablet by mouth daily. 30 tablet 2   ondansetron (ZOFRAN) 4 MG tablet Take 1 tablet (4 mg total) by mouth every 6 (six) hours as needed for nausea or vomiting. 30 tablet 0   potassium chloride SA (KLOR-CON) 20 MEQ tablet TAKE 1 TABLET (20 MEQ TOTAL) BY MOUTH 2 (TWO) TIMES DAILY. 60 tablet 2   spironolactone (ALDACTONE) 100 MG tablet TAKE 1 TABLET (100 MG TOTAL) BY MOUTH DAILY. 30 tablet 2   thiamine 100 MG tablet Take 1 tablet (100 mg total) by mouth daily. 30 tablet 5   diclofenac Sodium (VOLTAREN) 1 % GEL Apply 2 g topically 4 (four) times daily. To lower back (Patient not taking: No sig reported) 100 g 0   furosemide (LASIX) 40 MG tablet TAKE 1 TABLET (40 MG TOTAL) BY MOUTH 2 (TWO) TIMES DAILY. (Patient not taking: No sig reported) 60 tablet 5   lactulose, encephalopathy, (CHRONULAC) 10 GM/15ML SOLN TAKE 30 MLS (20 G TOTAL) BY MOUTH 2 (TWO) TIMES DAILY. (Patient not taking: No sig reported) 1800 mL 1   No facility-administered medications prior to visit.    No Known Allergies  ROS Review of Systems  Constitutional: Negative.   HENT: Negative.    Eyes: Negative.   Respiratory:  Negative for shortness of breath.   Cardiovascular:  Negative for chest pain.  Gastrointestinal: Negative.   Endocrine: Negative.   Genitourinary: Negative.   Musculoskeletal:  Positive for myalgias.  Skin: Negative.   Allergic/Immunologic: Negative.   Neurological: Negative.   Hematological: Negative.   Psychiatric/Behavioral: Negative.       Objective:    Physical Exam Vitals and nursing note reviewed.  Constitutional:      Appearance: Normal appearance.  HENT:     Head: Normocephalic and atraumatic.     Right Ear: External ear normal.     Left Ear: External ear normal.     Nose: Nose normal.     Mouth/Throat:     Mouth:  Mucous membranes are moist.     Pharynx: Oropharynx is clear.  Eyes:     Extraocular Movements: Extraocular movements intact.     Conjunctiva/sclera: Conjunctivae normal.     Pupils: Pupils are equal, round, and reactive to light.  Cardiovascular:     Rate and Rhythm: Normal rate and regular rhythm.     Pulses: Normal pulses.     Heart sounds: Normal heart sounds.  Pulmonary:     Effort: Pulmonary effort is normal.     Breath sounds: Normal breath sounds.  Musculoskeletal:        General: Normal range of motion.  Cervical back: Normal range of motion and neck supple.       Feet:  Feet:     Right foot:     Skin integrity: Skin integrity normal.     Left foot:     Skin integrity: Callus present.  Skin:    General: Skin is warm and dry.  Neurological:     General: No focal deficit present.     Mental Status: He is alert and oriented to person, place, and time.  Psychiatric:        Mood and Affect: Mood normal.        Behavior: Behavior normal.        Thought Content: Thought content normal.        Judgment: Judgment normal.    BP 133/84 (BP Location: Left Arm, Patient Position: Sitting, Cuff Size: Normal)   Pulse 70   Temp 98.7 F (37.1 C) (Oral)   Resp 18   Ht $R'5\' 5"'Hg$  (1.651 m)   Wt 235 lb (106.6 kg)   SpO2 99%   BMI 39.11 kg/m  Wt Readings from Last 3 Encounters:  02/22/21 235 lb (106.6 kg)  02/09/21 232 lb (105.2 kg)  09/19/20 227 lb 9.6 oz (103.2 kg)     Health Maintenance Due  Topic Date Due   TETANUS/TDAP  Never done   Pneumococcal Vaccine 31-54 Years old (2 - PCV) 06/11/2019   COVID-19 Vaccine (4 - Booster for Pfizer series) 09/13/2020   INFLUENZA VACCINE  11/28/2020    There are no preventive care reminders to display for this patient.  Lab Results  Component Value Date   TSH 0.708 05/07/2019   Lab Results  Component Value Date   WBC 6.0 06/16/2020   HGB 14.9 06/16/2020   HCT 42.6 06/16/2020   MCV 95 06/16/2020   PLT 44 (LL) 06/16/2020    Lab Results  Component Value Date   NA 144 02/09/2021   K 3.9 02/09/2021   CO2 20 02/09/2021   GLUCOSE 99 02/09/2021   BUN 8 02/09/2021   CREATININE 0.61 (L) 02/09/2021   BILITOT 1.9 (H) 06/16/2020   ALKPHOS 207 (H) 06/16/2020   AST 57 (H) 06/16/2020   ALT 38 06/16/2020   PROT 6.3 06/16/2020   ALBUMIN 3.8 (L) 06/16/2020   CALCIUM 9.2 02/09/2021   ANIONGAP 8 12/05/2018   EGFR 127 02/09/2021   GFR 114.50 12/08/2018   Lab Results  Component Value Date   CHOL 150 06/16/2020   Lab Results  Component Value Date   HDL 85 06/16/2020   Lab Results  Component Value Date   LDLCALC 52 06/16/2020   Lab Results  Component Value Date   TRIG 65 06/16/2020   Lab Results  Component Value Date   CHOLHDL 1.8 06/16/2020   Lab Results  Component Value Date   HGBA1C 4.6 (L) 06/16/2020      Assessment & Plan:   Problem List Items Addressed This Visit   None Visit Diagnoses     Acute pain of right thigh    -  Primary   Left foot pain       Relevant Orders   Ambulatory referral to Podiatry   Callus of foot       Relevant Orders   Ambulatory referral to Podiatry       No orders of the defined types were placed in this encounter. 1. Acute pain of right thigh Mostly resolved, encourage patient to continue supportive care.  Red flags given for  prompt reevaluation.  2. Left foot pain Patient education given on supportive care, refer to podiatry for further evaluation. - Ambulatory referral to Podiatry  3. Callus of foot  - Ambulatory referral to Podiatry    I have reviewed the patient's medical history (PMH, PSH, Social History, Family History, Medications, and allergies) , and have been updated if relevant. I spent 20 minutes reviewing chart and  face to face time with patient.    Follow-up: Return if symptoms worsen or fail to improve.    Loraine Grip Mayers, PA-C

## 2021-02-22 NOTE — Patient Instructions (Addendum)
Continue doing the stretching and make sure you are staying well hydrated.  You can try [place] or Crista Elliot, you will purchase this over the counter.  I have started a referral to see a foot specialist for your left foot pain  Roney Jaffe, PA-C Physician Assistant [facility] Mobile [facility] [email or web address]   Callos y durezas Corns and Calluses Los callos son pequeas zonas de piel engrosada que se forman en la parte superior, los costados o la punta de los dedos de los pies. Presentan un centro con forma cnica, con un punto que puede hacer presin sobre un nervio que est debajo. Esto ocasiona dolor. Las durezas son zonas de piel engrosada que pueden formarse en cualquier parte del cuerpo, entre ellas, las manos, los dedos y las palmas de las manos, las plantas de los pies y los talones. Por lo general, las durezas son ms grandes que los callos. Cules son las causas? Los callos y las durezas aparecen debido al roce (friccin) o a la presin, por ejemplo, por calzado muy ajustado o que no calza como corresponde. Qu incrementa el riesgo? Los callos son ms probables en las personas que tienen una deformidad en los dedos de los pies, por ejemplo, dedos en [place]. Las durezas se pueden formar debido a una friccin en cualquier zona de la piel. Es ms probable que se Airline pilot las personas que: Chiropodist con las [place]. Usan calzado que no les calza bien, es muy ajustado o con Dealer. Tienen una deformidad en los dedos de los pies. Cules son los signos o sntomas? Entre los sntomas de un callo o una dureza, se incluyen los siguientes: Una protuberancia dura en la piel. Sensibilidad o dolor a la palpacin debajo de la piel. Enrojecimiento e hinchazn. Ms molestias al usar calzado ajustado, si el rea afectada est en los pies. Si un callo o una dureza se infecta, los sntomas pueden incluir: Un enrojecimiento e hinchazn que  empeoran. Dolor. Lquido, sangre o pus proveniente del callo. Cmo se diagnostica? Los callos y durezas pueden diagnosticarse en funcin de los sntomas, los antecedentes mdicos y un examen fsico. Cmo se trata? El tratamiento de los callos y durezas puede incluir: Eliminacin de la causa de la friccin o de la presin. Esto puede implicar lo siguiente: Surveyor, minerals. Usar plantillas ortopdicas u otros elementos de proteccin en el calzado, como una almohadilla para durezas. Usar guantes. Aplicar medicamentos en la piel (medicamentos tpicos) para ayudar a Chief Technology Officer en las reas duras engrosadas. Extraer las capas muertas de piel para reducir el tamao del callo o de la dureza. Quitar el callo o dureza con un bistur o Corporate treasurer. Tomar antibiticos, si el callo o la dureza se infecta. Realizar una ciruga, si la causa es una deformidad en los dedos de los pies. Siga estas instrucciones en su casa:  Use los medicamentos de venta libre y los recetados solamente como se lo haya indicado el mdico. Si le recetaron un antibitico, tmelo como se lo haya indicado el mdico. No deje de tomarlo aunque la afeccin mejore. Use zapatos que calcen bien. Evite usar calzado con taco alto y calzado muy ajustado o muy flojo. Use almohadillas, elementos de proteccin, guantes o plantillas ortopdicas segn se lo haya indicado el mdico. Ponga en remojo las manos o los pies. Luego use una lima o una piedra pmez para suavizar el callo o la dureza. Haga esto como se lo haya indicado el mdico. Controle el callo o  la [employer] para detectar signos de infeccin. Comunquese con un mdico si: Los sntomas no mejoran con Scientist, research (medical). Tiene enrojecimiento o hinchazn que empeora. El callo o la dureza comienza a dolerle. Le sale lquido, sangre o pus del callo o de la dureza. Aparecen nuevos sntomas. Solicite ayuda de inmediato si: Tiene un dolor intenso con  enrojecimiento. Resumen Los callos son pequeas zonas de piel engrosada que se forman en la parte superior, los costados o la punta de los dedos de los pies. Estos pueden ser dolorosos. Las durezas son zonas de piel engrosada que pueden formarse en cualquier parte del cuerpo, entre ellas, las manos, los dedos y las palmas de las manos, las plantas de los pies. Por lo general, las durezas son ms grandes que los callos. Los callos y las durezas aparecen debido al roce (friccin) o a la presin, por ejemplo, por calzado muy ajustado o que no calza como corresponde. El tratamiento puede incluir usar almohadillas, elementos de proteccin, guantes o plantillas ortopdicas segn se lo haya indicado el mdico. Esta informacin no tiene Theme park manager el consejo del mdico. Asegrese de hacerle al mdico cualquier pregunta que tenga. Document Revised: 10/08/2019 Document Reviewed: 10/08/2019 Elsevier Patient Education  2022 [employer].

## 2021-02-27 ENCOUNTER — Ambulatory Visit (INDEPENDENT_AMBULATORY_CARE_PROVIDER_SITE_OTHER): Payer: 59 | Admitting: Podiatry

## 2021-02-27 ENCOUNTER — Other Ambulatory Visit: Payer: Self-pay

## 2021-02-27 ENCOUNTER — Other Ambulatory Visit: Payer: Self-pay | Admitting: Internal Medicine

## 2021-02-27 DIAGNOSIS — L989 Disorder of the skin and subcutaneous tissue, unspecified: Secondary | ICD-10-CM | POA: Diagnosis not present

## 2021-02-27 DIAGNOSIS — M79672 Pain in left foot: Secondary | ICD-10-CM

## 2021-02-27 DIAGNOSIS — K7030 Alcoholic cirrhosis of liver without ascites: Secondary | ICD-10-CM

## 2021-02-27 MED ORDER — LACTULOSE ENCEPHALOPATHY 10 GM/15ML PO SOLN
ORAL | 2 refills | Status: DC
  Filled 2021-02-27: qty 1892, 31d supply, fill #0
  Filled 2021-03-06: qty 1892, 30d supply, fill #0
  Filled 2021-04-03: qty 1892, 30d supply, fill #1

## 2021-02-27 MED ORDER — FUROSEMIDE 40 MG PO TABS
ORAL_TABLET | Freq: Two times a day (BID) | ORAL | 2 refills | Status: DC
  Filled 2021-02-27 – 2021-03-06 (×2): qty 60, 30d supply, fill #0
  Filled 2021-04-03: qty 60, 30d supply, fill #1

## 2021-02-27 NOTE — Telephone Encounter (Signed)
Requested Prescriptions  Pending Prescriptions Disp Refills  . furosemide (LASIX) 40 MG tablet 60 tablet 2    Sig: TAKE 1 TABLET (40 MG TOTAL) BY MOUTH 2 (TWO) TIMES DAILY.     Cardiovascular:  Diuretics - Loop Failed - 02/27/2021 10:12 AM      Failed - Cr in normal range and within 360 days    Creatinine, Ser  Date Value Ref Range Status  02/09/2021 0.61 (L) 0.76 - 1.27 mg/dL Final         Passed - K in normal range and within 360 days    Potassium  Date Value Ref Range Status  02/09/2021 3.9 3.5 - 5.2 mmol/L Final         Passed - Ca in normal range and within 360 days    Calcium  Date Value Ref Range Status  02/09/2021 9.2 8.7 - 10.2 mg/dL Final   Calcium, Ion  Date Value Ref Range Status  06/09/2019 1.30 1.15 - 1.40 mmol/L Final         Passed - Na in normal range and within 360 days    Sodium  Date Value Ref Range Status  02/09/2021 144 134 - 144 mmol/L Final         Passed - Last BP in normal range    BP Readings from Last 1 Encounters:  02/22/21 133/84         Passed - Valid encounter within last 6 months    Recent Outpatient Visits          5 days ago Acute pain of right thigh   [facility] [address] [facility] Mayers, Cari S, PA-C   2 weeks ago Groin pain, right   [facility] [place], [place], [place]   5 months ago Gross hematuria   [facility] [place], Gavin Pound B, MD   8 months ago Alcoholic cirrhosis of liver without ascites [facility])   [facility] Marcine Matar, MD   1 year ago Decompensated hepatic cirrhosis [facility]   [place] [facility] Marcine Matar, MD      Future Appointments            In 1 month Laural Benes, Binnie Rail, MD [facility]           . lactulose, encephalopathy, (CHRONULAC) 10 GM/15ML SOLN 1800 mL 2    Sig: TAKE 30 MLS (20 G TOTAL) BY MOUTH 2 (TWO) TIMES DAILY.      Gastroenterology:  Laxatives Passed - 02/27/2021 10:12 AM      Passed - Valid encounter within last 12 months    Recent Outpatient Visits          5 days ago Acute pain of right thigh   [facility] Mayers, Cari S, PA-C   2 weeks ago Groin pain, right   [place] [address] [facility] Mayers, [place], [place]   5 months ago Gross hematuria   [facility] Jonah Blue B, MD   8 months ago Alcoholic cirrhosis of liver without ascites [facility])   [place] [facility] Marcine Matar, MD   1 year ago Decompensated hepatic cirrhosis [facility])   [place] [facility] Marcine Matar, MD      Future Appointments  In 1 month Ladell Pier, MD [place]

## 2021-02-27 NOTE — Patient Instructions (Signed)
Powersteps or Superfeet arch supports available on [employer]

## 2021-02-27 NOTE — Progress Notes (Signed)
   Subjective: 40 y.o. male presenting to the office today as a new patient with his daughter for evaluation of symptomatic calluses to the plantar aspect of the left forefoot.  Patient is said they are very painful to walk on.  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  He presents for further treatment evaluation   Past Medical History:  Diagnosis Date   Alcoholic cirrhosis of liver ([facility])    Anemia    Ankle fracture    Right   GERD (gastroesophageal reflux disease)    Hypertension    Nocturia    Thrombocytopenia ([facility])      Objective:  Physical Exam General: Alert and oriented x3 in no acute distress  Dermatology: Hyperkeratotic lesion(s) present on the plantar aspect of the fifth MTP forefoot. Pain on palpation with a central nucleated core noted. Skin is warm, dry and supple bilateral lower extremities. Negative for open lesions or macerations.  Vascular: Palpable pedal pulses bilaterally. No edema or erythema noted. Capillary refill within normal limits.  Neurological: Epicritic and protective threshold grossly intact bilaterally.   Musculoskeletal Exam: Pain on palpation at the keratotic lesion(s) noted. Range of motion within normal limits bilateral. Muscle strength 5/5 in all groups bilateral.  Assessment: 1.  Symptomatic calluses left plantar forefoot   Plan of Care:  1. Patient evaluated 2. Excisional debridement of keratoic lesion(s) using a chisel blade was performed without incident.  3.  Advised against going barefoot 4.  Recommend OTC arch supports to support the arch and reduce pressure from the forefoot 5.  Return to clinic as needed  *Drywall construction  Felecia Shelling, DPM [facility]  Dr. Felecia Shelling, DPM    [address]                                        [place], [place] [address]                Office [phone]  Fax [phone]

## 2021-03-06 ENCOUNTER — Other Ambulatory Visit: Payer: Self-pay

## 2021-03-21 ENCOUNTER — Other Ambulatory Visit: Payer: Self-pay | Admitting: Nurse Practitioner

## 2021-03-21 DIAGNOSIS — K7031 Alcoholic cirrhosis of liver with ascites: Secondary | ICD-10-CM

## 2021-04-03 ENCOUNTER — Ambulatory Visit
Admission: RE | Admit: 2021-04-03 | Discharge: 2021-04-03 | Disposition: A | Discharge status: Home / Self Care | Payer: 59 | Source: Ambulatory Visit | Attending: Nurse Practitioner | Admitting: Nurse Practitioner

## 2021-04-03 ENCOUNTER — Other Ambulatory Visit: Payer: Self-pay | Admitting: Internal Medicine

## 2021-04-03 ENCOUNTER — Other Ambulatory Visit: Payer: Self-pay

## 2021-04-03 DIAGNOSIS — K7031 Alcoholic cirrhosis of liver with ascites: Secondary | ICD-10-CM

## 2021-04-03 MED ORDER — FOLIC ACID 1 MG PO TABS
1.0000 mg | ORAL_TABLET | Freq: Every day | ORAL | 2 refills | Status: DC
  Filled 2021-04-03 – 2021-05-15 (×3): qty 30, 30d supply, fill #0
  Filled 2021-06-20: qty 30, 30d supply, fill #1

## 2021-04-03 NOTE — Telephone Encounter (Signed)
Requested medication (s) are due for refill today: Yes  Requested medication (s) are on the active medication list: Yes  Last refill:  12/14/20 #30/2 RF  Future visit scheduled: Yes  Notes to clinic:  Unable to refill per protocol, medication not assigned to the refill protocol.      Requested Prescriptions  Pending Prescriptions Disp Refills   folic acid (FOLVITE) 1 MG tablet 30 tablet 2    Sig: Take 1 tablet (1 mg total) by mouth daily.     There is no refill protocol information for this order

## 2021-04-10 ENCOUNTER — Other Ambulatory Visit: Payer: Self-pay

## 2021-04-12 ENCOUNTER — Other Ambulatory Visit: Payer: Self-pay

## 2021-04-14 ENCOUNTER — Telehealth: Payer: Self-pay | Admitting: General Surgery

## 2021-04-14 NOTE — Telephone Encounter (Signed)
Tried to contact he patient via his daughter Kimberely. No answer and there was no voicemail

## 2021-04-14 NOTE — Telephone Encounter (Signed)
-----   Message from Edwin Cap, CMA sent at 04/04/2021  4:39 PM EST ----- Regarding: repeat EGD at [facility] Per DR Cirigliano the patient needs repeat EGD at [facility] for EV surveillance

## 2021-04-14 NOTE — Telephone Encounter (Signed)
Spoke with pt and informed him that an EGD will need to be scheduled at [facility] for EV surveillance, informed pt the appt will most likely be scheduled in March and that someone will be calling him back with the appt date and time, pt voiced understanding.

## 2021-04-25 ENCOUNTER — Other Ambulatory Visit: Payer: Self-pay

## 2021-04-25 ENCOUNTER — Encounter: Payer: Self-pay | Admitting: Internal Medicine

## 2021-04-25 ENCOUNTER — Ambulatory Visit: Payer: 59 | Attending: Internal Medicine | Admitting: Internal Medicine

## 2021-04-25 VITALS — BP 125/80 | HR 78 | Ht 65.0 in | Wt 244.8 lb

## 2021-04-25 DIAGNOSIS — N62 Hypertrophy of breast: Secondary | ICD-10-CM

## 2021-04-25 DIAGNOSIS — L309 Dermatitis, unspecified: Secondary | ICD-10-CM

## 2021-04-25 DIAGNOSIS — R319 Hematuria, unspecified: Secondary | ICD-10-CM | POA: Insufficient documentation

## 2021-04-25 DIAGNOSIS — Z23 Encounter for immunization: Secondary | ICD-10-CM | POA: Diagnosis not present

## 2021-04-25 DIAGNOSIS — K7030 Alcoholic cirrhosis of liver without ascites: Secondary | ICD-10-CM | POA: Diagnosis not present

## 2021-04-25 MED ORDER — SPIRONOLACTONE 25 MG PO TABS
25.0000 mg | ORAL_TABLET | Freq: Every day | ORAL | 1 refills | Status: DC
  Filled 2021-04-25: qty 90, 90d supply, fill #0

## 2021-04-25 MED ORDER — FUROSEMIDE 40 MG PO TABS
40.0000 mg | ORAL_TABLET | Freq: Every day | ORAL | 1 refills | Status: DC
  Filled 2021-04-25: qty 90, 90d supply, fill #0

## 2021-04-25 MED ORDER — TRIAMCINOLONE ACETONIDE 0.1 % EX CREA
1.0000 | TOPICAL_CREAM | Freq: Two times a day (BID) | CUTANEOUS | 0 refills | Status: DC
Start: 2021-04-25 — End: 2021-07-18
  Filled 2021-04-25: qty 30, 15d supply, fill #0
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
## 2021-04-25 NOTE — Progress Notes (Signed)
Patient had pain on left side of abdominal. Started about 1 month ago. No pain today.

## 2021-04-26 LAB — BASIC METABOLIC PANEL
BUN/Creatinine Ratio: 16 (ref 9–20)
BUN: 9 mg/dL (ref 6–20)
CO2: 22 mmol/L (ref 20–29)
Calcium: 8.6 mg/dL — ABNORMAL LOW (ref 8.7–10.2)
Chloride: 111 mmol/L — ABNORMAL HIGH (ref 96–106)
Creatinine, Ser: 0.56 mg/dL — ABNORMAL LOW (ref 0.76–1.27)
Glucose: 112 mg/dL — ABNORMAL HIGH (ref 70–99)
Potassium: 3.6 mmol/L (ref 3.5–5.2)
Sodium: 143 mmol/L (ref 134–144)
eGFR: 129 mL/min/{1.73_m2} (ref >59)

## 2021-05-15 ENCOUNTER — Other Ambulatory Visit: Payer: Self-pay | Admitting: Internal Medicine

## 2021-05-15 ENCOUNTER — Other Ambulatory Visit: Payer: Self-pay

## 2021-05-15 DIAGNOSIS — K7031 Alcoholic cirrhosis of liver with ascites: Secondary | ICD-10-CM

## 2021-05-15 MED ORDER — POTASSIUM CHLORIDE CRYS ER 20 MEQ PO TBCR
EXTENDED_RELEASE_TABLET | Freq: Two times a day (BID) | ORAL | 2 refills | Status: DC
  Filled 2021-05-15: qty 60, 30d supply, fill #0
  Filled 2021-06-20: qty 60, 30d supply, fill #1
  Filled 2021-07-12: qty 60, 30d supply, fill #2

## 2021-05-15 NOTE — Telephone Encounter (Signed)
Requested Prescriptions  Pending Prescriptions Disp Refills   potassium chloride SA (KLOR-CON M) 20 MEQ tablet 60 tablet 2    Sig: TAKE 1 TABLET (20 MEQ TOTAL) BY MOUTH 2 (TWO) TIMES DAILY.     Endocrinology:  Minerals - Potassium Supplementation Failed - 05/15/2021  3:37 PM      Failed - Cr in normal range and within 360 days    Creatinine, Ser  Date Value Ref Range Status  04/25/2021 0.56 (L) 0.76 - 1.27 mg/dL Final         Passed - K in normal range and within 360 days    Potassium  Date Value Ref Range Status  04/25/2021 3.6 3.5 - 5.2 mmol/L Final         Passed - Valid encounter within last 12 months    Recent Outpatient Visits          2 weeks ago Alcoholic cirrhosis of liver without ascites ([place])   [place] Ladell Pier, MD   2 months ago Acute pain of right thigh   [place], [place], PA-C   3 months ago Groin pain, right   [place], [place], [place]   7 months ago Gross hematuria   [place], Neoma Laming B, MD   11 months ago Alcoholic cirrhosis of liver without ascites [facility])   [facility] Ladell Pier, MD

## 2021-05-15 NOTE — Telephone Encounter (Signed)
Requested medication (s) are due for refill today: Yes  Requested medication (s) are on the active medication list: Yes  Last refill:  03/2020  Future visit scheduled: No  Notes to clinic:  Expired med, unable to refill per protocol.      Requested Prescriptions  Pending Prescriptions Disp Refills   famotidine (PEPCID) 20 MG tablet 90 tablet 4    Sig: Take 1 tablet (20 mg total) by mouth daily.     Gastroenterology:  H2 Antagonists Passed - 05/15/2021  3:52 PM      Passed - Valid encounter within last 12 months    Recent Outpatient Visits           2 weeks ago Alcoholic cirrhosis of liver without ascites ([place])   [place] Ladell Pier, MD   2 months ago Acute pain of right thigh   [place], [place]   3 months ago Groin pain, right   [place], [place]   7 months ago Gross hematuria   [place], MD   11 months ago Alcoholic cirrhosis of liver without ascites [facility])   [facility] Ladell Pier, MD

## 2021-05-17 ENCOUNTER — Other Ambulatory Visit: Payer: Self-pay

## 2021-05-24 ENCOUNTER — Other Ambulatory Visit: Payer: Self-pay

## 2021-06-20 ENCOUNTER — Other Ambulatory Visit: Payer: Self-pay

## 2021-07-10 ENCOUNTER — Other Ambulatory Visit: Payer: Self-pay | Admitting: Internal Medicine

## 2021-07-10 ENCOUNTER — Other Ambulatory Visit: Payer: Self-pay

## 2021-07-10 DIAGNOSIS — K7030 Alcoholic cirrhosis of liver without ascites: Secondary | ICD-10-CM

## 2021-07-10 DIAGNOSIS — K7031 Alcoholic cirrhosis of liver with ascites: Secondary | ICD-10-CM

## 2021-07-10 MED ORDER — FAMOTIDINE 20 MG PO TABS
20.0000 mg | ORAL_TABLET | Freq: Every day | ORAL | 3 refills | Status: DC
  Filled 2021-07-10: qty 30, 30d supply, fill #0
  Filled 2021-09-18: qty 30, 30d supply, fill #1

## 2021-07-10 NOTE — Telephone Encounter (Signed)
Pt is ready to be scheduled for his EGD at [facility] for this month. Pt also states he needs a refill on his precription for famotidine.  ?

## 2021-07-10 NOTE — Telephone Encounter (Signed)
Can you tell him that a script has been sent in and that we currently do not have any hospital days. He needs to call back in 2 weeks (around April) to see if we have anything available in June or July. Our schedule runs 2 months in advance and he needs to call 2 months in advance to get a procedure schedule and hospital days are limited so he needs to keep checking on when we have availability  ?

## 2021-07-11 ENCOUNTER — Other Ambulatory Visit: Payer: Self-pay

## 2021-07-11 MED ORDER — FUROSEMIDE 40 MG PO TABS
40.0000 mg | ORAL_TABLET | Freq: Every day | ORAL | 1 refills | Status: DC
  Filled 2021-07-11: qty 90, 90d supply, fill #0
  Filled 2021-10-17: qty 90, 90d supply, fill #1

## 2021-07-11 MED ORDER — FOLIC ACID 1 MG PO TABS
1.0000 mg | ORAL_TABLET | Freq: Every day | ORAL | 2 refills | Status: DC
  Filled 2021-07-11 – 2021-08-09 (×2): qty 30, 30d supply, fill #0
  Filled 2021-09-12: qty 30, 30d supply, fill #1
  Filled 2021-10-17: qty 30, 30d supply, fill #2

## 2021-07-11 MED ORDER — SPIRONOLACTONE 25 MG PO TABS
25.0000 mg | ORAL_TABLET | Freq: Every day | ORAL | 1 refills | Status: DC
  Filled 2021-07-11: qty 90, 90d supply, fill #0
  Filled 2021-09-12 – 2021-09-15 (×2): qty 90, 90d supply, fill #1

## 2021-07-11 NOTE — Telephone Encounter (Signed)
Requested Prescriptions  ?Pending Prescriptions Disp Refills  ?? spironolactone (ALDACTONE) 25 MG tablet 90 tablet 1  ?  Sig: Take 1 tablet (25 mg total) by mouth daily.  ?  ? Cardiovascular: Diuretics - Aldosterone Antagonist Failed - 07/10/2021  2:47 PM  ?  ?  Failed - Cr in normal range and within 180 days  ?  Creatinine, Ser  ?Date Value Ref Range Status  ?04/25/2021 0.56 (L) 0.76 - 1.27 mg/dL Final  ?   ?  ?  Passed - K in normal range and within 180 days  ?  Potassium  ?Date Value Ref Range Status  ?04/25/2021 3.6 3.5 - 5.2 mmol/L Final  ?   ?  ?  Passed - Na in normal range and within 180 days  ?  Sodium  ?Date Value Ref Range Status  ?04/25/2021 143 134 - 144 mmol/L Final  ?   ?  ?  Passed - eGFR is 30 or above and within 180 days  ?  GFR calc Af Amer  ?Date Value Ref Range Status  ?06/16/2020 145 >59 mL/min/1.73 Final  ?  Comment:  ?  **In accordance with recommendations from the NKF-ASN Task force,** ?  [facility] is in the process of updating its eGFR calculation to the ?  2021 CKD-EPI creatinine equation that estimates kidney function ?  without a race variable. ?  ? ?GFR calc non Af Amer  ?Date Value Ref Range Status  ?06/16/2020 125 >59 mL/min/1.73 Final  ? ?GFR  ?Date Value Ref Range Status  ?12/08/2018 114.50 >60.00 mL/min Final  ? ?eGFR  ?Date Value Ref Range Status  ?04/25/2021 129 >59 mL/min/1.73 Final  ?   ?  ?  Passed - Last BP in normal range  ?  BP Readings from Last 1 Encounters:  ?04/25/21 125/80  ?   ?  ?  Passed - Valid encounter within last 6 months  ?  Recent Outpatient Visits   ?      ? 2 months ago Alcoholic cirrhosis of liver without ascites ([place])  ? [place] Karle Plumber B, MD  ? 4 months ago Acute pain of right thigh  ? [place], [place]  ? 5 months ago Groin pain, right  ? [place], [place]  ? 9 months ago Gross hematuria  ? [place] Karle Plumber B, MD  ? 1 year ago Alcoholic cirrhosis of liver without ascites ([place])  ? [place] Ladell Pier, MD  ?  ?  ?Future Appointments   ?        ? In 1 week Ladell Pier, MD [place]  ?  ? ?  ?  ?  ?? furosemide (LASIX) 40 MG tablet 90 tablet 1  ?  Sig: Take 1 tablet (40 mg total) by mouth daily.  ?  ? Cardiovascular:  Diuretics - Loop Failed - 07/10/2021  2:47 PM  ?  ?  Failed - Ca in normal range and within 180 days  ?  Calcium  ?Date Value Ref Range Status  ?04/25/2021 8.6 (L) 8.7 - 10.2 mg/dL Final  ? ?Calcium, Ion  ?Date Value Ref Range Status  ?06/09/2019 1.30 1.15 - 1.40 mmol/L Final  ?   ?  ?  Failed - Cr in normal range and within 180 days  ?  Creatinine, Ser  ?Date Value Ref Range Status  ?04/25/2021 0.56 (L) 0.76 - 1.27 mg/dL Final  ?   ?  ?  Failed - Cl in normal range and within 180 days  ?  Chloride  ?Date Value Ref Range Status  ?04/25/2021 111 (H) 96 - 106 mmol/L Final  ?   ?  ?  Failed - Mg Level in normal range and within 180 days  ?  Magnesium  ?Date Value Ref Range Status  ?05/01/2018 1.9 1.7 - 2.4 mg/dL Final  ?  Comment:  ?  Performed at [facility], [place] [address], [place], [place] [address]  ?   ?  ?  Passed - K in normal range and within 180 days  ?  Potassium  ?Date Value Ref Range Status  ?04/25/2021 3.6 3.5 - 5.2 mmol/L Final  ?   ?  ?  Passed - Na in normal range and within 180 days  ?  Sodium  ?Date Value Ref Range Status  ?04/25/2021 143 134 - 144 mmol/L Final  ?   ?  ?  Passed - Last BP in normal range  ?  BP Readings from Last 1 Encounters:  ?04/25/21 125/80  ?   ?  ?  Passed - Valid encounter within last 6 months  ?  Recent Outpatient Visits   ?      ? 2 months ago Alcoholic cirrhosis of liver without ascites ([place])  ? [place] Karle Plumber B, MD  ? 4 months ago Acute pain of right thigh  ? [place], [place]  ? 5 months ago Groin pain, right  ? [place], [place]  ? 9 months ago Gross hematuria  ? [place] Karle Plumber B, MD  ? 1 year ago Alcoholic cirrhosis of liver without ascites ([place])  ? [place] Ladell Pier, MD  ?  ?  ?Future Appointments   ?        ? In 1 week Ladell Pier, MD [place]  ?  ? ?  ?  ?  ? ?

## 2021-07-11 NOTE — Telephone Encounter (Signed)
Requested Prescriptions  ?Pending Prescriptions Disp Refills  ?? folic acid (FOLVITE) 1 MG tablet 30 tablet 2  ?  Sig: Take 1 tablet (1 mg total) by mouth daily.  ?  ? Endocrinology:  Vitamins Passed - 07/11/2021 11:20 AM  ?  ?  Passed - Valid encounter within last 12 months  ?  Recent Outpatient Visits   ?      ? 2 months ago Alcoholic cirrhosis of liver without ascites ([place])  ? [place] Karle Plumber B, MD  ? 4 months ago Acute pain of right thigh  ? [place], [place]  ? 5 months ago Groin pain, right  ? [place], [place]  ? 9 months ago Gross hematuria  ? [place] Karle Plumber B, MD  ? 1 year ago Alcoholic cirrhosis of liver without ascites ([place])  ? [place] Ladell Pier, MD  ?  ?  ?Future Appointments   ?        ? In 1 week Ladell Pier, MD [place]  ?  ? ?  ?  ?  ? ?

## 2021-07-12 ENCOUNTER — Other Ambulatory Visit: Payer: Self-pay

## 2021-07-14 ENCOUNTER — Other Ambulatory Visit: Payer: Self-pay

## 2021-07-18 ENCOUNTER — Other Ambulatory Visit: Payer: Self-pay

## 2021-07-18 ENCOUNTER — Ambulatory Visit: Payer: Self-pay | Attending: Internal Medicine | Admitting: Internal Medicine

## 2021-07-18 VITALS — BP 131/78 | HR 61 | Wt 243.6 lb

## 2021-07-18 DIAGNOSIS — I8500 Esophageal varices without bleeding: Secondary | ICD-10-CM

## 2021-07-18 DIAGNOSIS — K7030 Alcoholic cirrhosis of liver without ascites: Secondary | ICD-10-CM

## 2021-07-18 DIAGNOSIS — F1021 Alcohol dependence, in remission: Secondary | ICD-10-CM

## 2021-07-18 DIAGNOSIS — D696 Thrombocytopenia, unspecified: Secondary | ICD-10-CM

## 2021-07-18 DIAGNOSIS — Z6841 Body Mass Index (BMI) 40.0 and over, adult: Secondary | ICD-10-CM

## 2021-07-18 NOTE — Patient Instructions (Signed)
Alimentaci?n saludable ?Healthy Eating ?Seguir una modalidad de alimentaci?n saludable puede ayudarlo a Barista y Pharmacologist un peso saludable, reducir el riesgo de tener enfermedades cr?nicas y vivir Neomia Dear vida larga y productiva. Es importante que siga una modalidad de alimentaci?n saludable con un nivel adecuado de calor?as para su cuerpo. Debe cubrir sus necesidades nutricionales principalmente a trav?s de los alimentos, escogiendo una variedad de alimentos ricos en nutrientes. ??Cu?les son algunos consejos para seguir este plan? ?Lea las etiquetas de los alimentos ?Lea las etiquetas y elija las que digan lo siguiente: ?Reducido en sodio o con bajo contenido de sodio. ?Jugos con 100 % jugo de fruta. ?Alimentos con bajo contenido de grasas saturadas y alto contenido de grasas poliinsaturadas y Mining engineer. ?Alimentos con cereales integrales, como trigo integral, trigo partido, arroz integral y arroz salvaje. ?Cereales integrales fortificados con ?cido f?lico. Se recomienda a las mujeres embarazadas o que desean quedar embarazadas. ?Lea las etiquetas y evite: ?Los alimentos con una gran cantidad de az?cares agregados. Estos incluyen los alimentos que contienen az?car moreno, endulzante a base de ma?z, jarabe de ma?z, dextrosa, fructosa, [place], [place] de ma?z de alta fructosa, miel, az?car invertido, lactosa, jarabe de [place], maltosa, [place], az?car sin refinar, sacarosa, trehalosa y az?car turbinado. ?No consuma m?s que las siguientes cantidades de az?car agregada por d?a: ?6 cucharaditas (25 g) las mujeres. ?9 cucharaditas (38 g) los hombres. ?Los alimentos que contienen almidones y cereales refinados o procesados. ?Los productos de cereales refinados, como harina Dickinson, harina de ma?z desgerminada, pan blanco y arroz blanco. ?Al ir de compras ?Elija refrigerios ricos en nutrientes, como verduras, frutas enteras y frutos secos. Evite los refrigerios con alto contenido de calor?as y az?car, como las papas  fritas, los refrigerios frutales y los caramelos. ?Use ali?os y productos para untar a base de aceite con los Publishing rights manager de grasas s?lidas como la [place], la margarina en barra o el queso crema. ?Limite las salsas, las mezclas y los productos ?instant?neos? preelaborados como el arroz saborizado, los fideos instant?neos y las pastas listas para comer. ?Pruebe m?s fuentes de prote?na vegetal, como tofu, tempeh, frijoles negros, edamame, lentejas, frutos secos y semillas. ?Explore planes de alimentaci?n como la dieta mediterr?nea o la dieta vegetariana. ?Al cocinar ?Use aceite para Designer, multimedia de grasas s?lidas como [place], margarina en barra o [place] de cerdo. ?En lugar de fre?r, trate de cocinar en el horno, en la plancha o en la parrilla, o hervir los alimentos. ?Retire la parte grasa de las carnes antes de cocinarlas. ?Cocine las verduras al vapor en agua o caldo. ?Planificaci?n de las comidas ? ?En las comidas, imagine dividir su plato en cuartos: ?La mitad del plato tiene frutas y verduras. ?Un cuarto del plato tiene cereales integrales. ?Un cuarto del plato tiene prote?na, especialmente carnes magras, aves, huevos, tofu, frijoles o frutos secos. ?Incluya l?cteos descremados en su dieta diaria. ?Estilo de vida ?Elija opciones saludables en todos los ?mbitos, [employer], el trabajo, la [place], los restaurantes y [place]. ?Prepare los alimentos de un modo seguro: ?L?vese las manos despu?s de manipular carnes crudas. ?Mantenga las superficies de preparaci?n de los alimentos limpias lav?ndolas regularmente con agua caliente y jab?n. ?Mantenga las carnes crudas separadas de los alimentos que est?n listos para comer como las frutas y las verduras. ?Cocine los frutos de mar, carnes, aves y Production manager la temperatura interna recomendada. ?Almacene los alimentos a temperaturas seguras. En general: ?Mantenga los alimentos fr?os a una temperatura de 40 ?  F (4,4 ?C)  o inferior. ?Mantenga los alimentos calientes a una temperatura de 140 ?F (60 ?C) o superior. ?Mantenga el congelador a una temperatura de 0 ?F (-17,8 ?C) o inferior. ?Los alimentos dejan de ser seguros para su consumo cuando han estado a una temperatura de entre 40? y 140 ?F (4,4? y 15 ?C) por m?s de 2 horas. ??Qu? alimentos debo consumir? ?Nils Pyle ?Prop?ngase comer el equivalente a 2 tazas de frutas frescas, enlatadas (en su jugo natural) o congeladas cada d?a. Algunos ejemplos de equivalentes a 1 taza de frutas son 1 manzana peque?a, 8 fresas grandes, 1 taza de fruta enlatada, ? taza de fruta desecada o 1 taza de jugo 100 %. ?Verduras ?Prop?ngase comer el equivalente a 2? o 3 tazas de verduras frescas y congeladas cada d?a, incluyendo diferentes variedades y colores. Algunos ejemplos de equivalentes a 1 taza de verduras son 2 zanahorias medianas, 2 tazas de verduras de [employer] crudas, 1 taza de verduras cortadas (crudas o cocidas) o 1 papa mediana al horno. ?Granos ?Prop?ngase comer el equivalente a 6 onzas de cereales integrales por d?a. Algunos ejemplos de equivalentes a 1 onza de cereales son 1 rebanada de pan, 1 taza de cereal listo para comer, 3 tazas de palomitas de ma?z o ? taza de arroz, pasta o cereales cocidos. ?Carnes y otras prote?nas ?Prop?ngase comer el equivalente a 5 o 6 onzas de prote?na por d?a. Algunos ejemplos de equivalentes a 1 onza de prote?na son 1 huevo, ? taza de frutos secos o semillas o 1 cucharada (16 g) de [place] de man?Marland Kitchen Un corte de carne o pescado del tama?o de un mazo de cartas equivale aproximadamente a 3 a 4 onzas. ?De las prote?nas que consume cada semana, intente que al menos 8 onzas provengan de frutos de mar. Esto incluye el salm?n, la [place], el arenque y las Pace. ?L?cteos ?Prop?ngase comer el equivalente a 3 tazas de l?cteos descremados o con bajo contenido de grasa cada d?a. Algunos ejemplos de equivalentes a 1 taza de l?cteos son 1 taza (240 ml) de leche, 8  onzas (250 g) de Dentist, 1? onzas (44 g) de queso natural o 1 taza (240 ml) de leche de soja fortificada. ?Grasas y aceites ?Prop?ngase consumir alrededor de 5 cucharaditas (21 g) por d?a. Elija grasas monoinsaturadas, como el aceite de canola y de [place], [place], [place] de man? y Camera operator de los frutos secos, o bien grasas poliinsaturadas, como el aceite de [place], ma?z y soja, nueces, pi?ones, semillas de s?samo, semillas de girasol y semillas de lino. ?Bebidas ?Prop?ngase beber seis vasos de 8 onzas de agua por d?a. Limite el caf? a entre tres y cinco tazas de 8 onzas por d?a. ?Limite el consumo de bebidas con cafe?na que tengan calor?as agregadas, como los refrescos y las bebidas energizantes. ?Limite el consumo de alcohol a no m?s de 1 medida por d?a si es mujer y no est? embarazada, y a 2 medidas por d?a si es hombre. Una medida equivale a 12 onzas de cerveza (355 ml), 5 onzas de vino (148 ml) o 1? onzas de bebidas alcoh?licas de alta graduaci?n (44 ml). ?Condimentos y otros alimentos ?Evite agregar cantidades excesivas de sal a los alimentos. Pruebe darles sabor con hierbas y especias en lugar de sal. ?Evite agregar az?car a los alimentos. ?Pruebe usar ali?os, salsas y productos untables a base de Research scientist (life sciences) de grasas s?lidas. ?Esta informaci?n se basa en las pautas generales de nutrici?n de los [place]. Para obtener  m?s informaci?n, visite DisposableNylon.bechoosemyplate.gov. Las cantidades exactas pueden variar en funci?n de sus necesidades nutricionales. ?Resumen ?Un plan de alimentaci?n saludable puede ayudarlo a mantener un peso saludable, reducir el riesgo de tener enfermedades cr?nicas y Tharptownmantenerse activo durante toda su vida. ?Planifique sus comidas. Aseg?rese de consumir las porciones correctas de una variedad de alimentos ricos en nutrientes. ?En lugar de fre?r, trate de cocinar en el horno, en la plancha o en la parrilla, o hervir los alimentos. ?Elija opciones saludables en todos los ?mbitos, Phelps Dodgecomo en el  hogar, el trabajo, la [place]uela, los restaurantes y Saynerlas tiendas. ?Esta informaci?n no tiene Theme park managercomo fin reemplazar el consejo del m?dico. Aseg?rese de hacerle al m?dico cualquier pregunta que tenga. ?Document R
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
## 2021-07-18 NOTE — Telephone Encounter (Signed)
He needs to call us at the beginning of April to see about the schedule. It was in my last note when to call ?

## 2021-07-18 NOTE — Telephone Encounter (Signed)
I told him to call at the beginning of April, too. He just wanted me to pass along his request.  ?

## 2021-07-18 NOTE — Telephone Encounter (Signed)
Pt called again inquiring about procedure at the hospital. I let pt know about hospital availability. He is requesting if we could call him when calendar for June and July becomes available so he can schedule his procedure.  ?

## 2021-08-09 ENCOUNTER — Other Ambulatory Visit: Payer: Self-pay

## 2021-08-10 ENCOUNTER — Telehealth: Payer: Self-pay

## 2021-08-10 ENCOUNTER — Other Ambulatory Visit: Payer: Self-pay

## 2021-08-10 DIAGNOSIS — I8500 Esophageal varices without bleeding: Secondary | ICD-10-CM

## 2021-08-10 NOTE — Telephone Encounter (Signed)
Patient is scheduled egd at [facility] on 09/11/21 at 7:30 am. Patient is made aware of his appointment and instruction was provided via telephone and MyChart in Spanish by Joey. ?

## 2021-09-04 ENCOUNTER — Encounter (HOSPITAL_COMMUNITY): Payer: Self-pay | Admitting: Gastroenterology

## 2021-09-04 NOTE — Progress Notes (Signed)
**  Using interpretor services-Attempted to obtain medical history via telephone, unable to reach at this time. Unable to leave voicemail to return pre surgical testing department's phone call.  ?

## 2021-09-11 ENCOUNTER — Encounter (HOSPITAL_COMMUNITY): Payer: Self-pay | Admitting: Gastroenterology

## 2021-09-11 ENCOUNTER — Encounter (HOSPITAL_COMMUNITY)
Admission: RE | Disposition: A | Discharge status: Home / Self Care | Payer: Self-pay | Source: Home / Self Care | Attending: Gastroenterology

## 2021-09-11 ENCOUNTER — Other Ambulatory Visit: Payer: Self-pay

## 2021-09-11 ENCOUNTER — Ambulatory Visit (HOSPITAL_COMMUNITY): Payer: 59 | Admitting: Anesthesiology

## 2021-09-11 ENCOUNTER — Ambulatory Visit (HOSPITAL_BASED_OUTPATIENT_CLINIC_OR_DEPARTMENT_OTHER): Payer: 59 | Admitting: Anesthesiology

## 2021-09-11 ENCOUNTER — Ambulatory Visit (HOSPITAL_COMMUNITY)
Admission: RE | Admit: 2021-09-11 | Discharge: 2021-09-11 | Disposition: A | Discharge status: Home / Self Care | Payer: 59 | Attending: Gastroenterology | Admitting: Gastroenterology

## 2021-09-11 DIAGNOSIS — I8500 Esophageal varices without bleeding: Secondary | ICD-10-CM

## 2021-09-11 DIAGNOSIS — K7460 Unspecified cirrhosis of liver: Secondary | ICD-10-CM | POA: Diagnosis not present

## 2021-09-11 DIAGNOSIS — I8510 Secondary esophageal varices without bleeding: Secondary | ICD-10-CM

## 2021-09-11 DIAGNOSIS — Z79899 Other long term (current) drug therapy: Secondary | ICD-10-CM | POA: Diagnosis not present

## 2021-09-11 DIAGNOSIS — I10 Essential (primary) hypertension: Secondary | ICD-10-CM

## 2021-09-11 DIAGNOSIS — K2970 Gastritis, unspecified, without bleeding: Secondary | ICD-10-CM

## 2021-09-11 DIAGNOSIS — Z87891 Personal history of nicotine dependence: Secondary | ICD-10-CM | POA: Diagnosis not present

## 2021-09-11 DIAGNOSIS — K2950 Unspecified chronic gastritis without bleeding: Secondary | ICD-10-CM | POA: Diagnosis not present

## 2021-09-11 DIAGNOSIS — K219 Gastro-esophageal reflux disease without esophagitis: Secondary | ICD-10-CM | POA: Diagnosis not present

## 2021-09-11 HISTORY — PX: ESOPHAGOGASTRODUODENOSCOPY (EGD) WITH PROPOFOL: SHX5813

## 2021-09-11 HISTORY — PX: BIOPSY: SHX5522

## 2021-09-11 SURGERY — ESOPHAGOGASTRODUODENOSCOPY (EGD) WITH PROPOFOL
Anesthesia: Monitor Anesthesia Care

## 2021-09-11 MED ORDER — HYDROMORPHONE HCL 1 MG/ML IJ SOLN: 0.2500 mg | INTRAMUSCULAR | Status: DC | PRN

## 2021-09-11 MED ORDER — DEXMEDETOMIDINE (PRECEDEX) IN NS 20 MCG/5ML (4 MCG/ML) IV SYRINGE
PREFILLED_SYRINGE | INTRAVENOUS | Status: DC | PRN
  Administered 2021-09-11: 8 ug via INTRAVENOUS
  Administered 2021-09-11: 4 ug via INTRAVENOUS
  Administered 2021-09-11: 8 ug via INTRAVENOUS

## 2021-09-11 MED ORDER — OXYCODONE HCL 5 MG PO TABS: 5.0000 mg | ORAL_TABLET | Freq: Once | ORAL | Status: DC | PRN

## 2021-09-11 MED ORDER — LACTATED RINGERS IV SOLN: INTRAVENOUS | Status: DC | PRN

## 2021-09-11 MED ORDER — PROPOFOL 10 MG/ML IV BOLUS
INTRAVENOUS | Status: DC | PRN
  Administered 2021-09-11: 10 mg via INTRAVENOUS
  Administered 2021-09-11: 30 mg via INTRAVENOUS
  Administered 2021-09-11: 10 mg via INTRAVENOUS

## 2021-09-11 MED ORDER — OXYCODONE HCL 5 MG/5ML PO SOLN: 5.0000 mg | Freq: Once | ORAL | Status: DC | PRN

## 2021-09-11 MED ORDER — SODIUM CHLORIDE 0.9 % IV SOLN: INTRAVENOUS | Status: DC

## 2021-09-11 MED ORDER — PROPOFOL 500 MG/50ML IV EMUL
INTRAVENOUS | Status: DC | PRN
  Administered 2021-09-11: 150 ug/kg/min via INTRAVENOUS

## 2021-09-11 MED ORDER — PROMETHAZINE HCL 25 MG/ML IJ SOLN: 6.2500 mg | INTRAMUSCULAR | Status: DC | PRN

## 2021-09-11 SURGICAL SUPPLY — 15 items

## 2021-09-11 NOTE — Anesthesia Preprocedure Evaluation (Addendum)
Anesthesia Evaluation  ?Patient identified by MRN, date of birth, ID band ?Patient awake ? ? ? ?Reviewed: ?Allergy & Precautions, NPO status , Patient's Chart, lab work & pertinent test results ? ?Airway ?Mallampati: III ? ?TM Distance: >3 FB ?Neck ROM: Full ? ? ? Dental ? ?(+) Teeth Intact, Dental Advisory Given ?  ?Pulmonary ?former smoker,  ?  ?Pulmonary exam normal ? ? ? ? ? ? ? Cardiovascular ?hypertension, Pt. on medications ? ?Rhythm:Regular Rate:Normal ? ? ?  ?Neuro/Psych ?PSYCHIATRIC DISORDERS negative neurological ROS ?   ? GI/Hepatic ?GERD  Medicated,(+) Cirrhosis  ? Esophageal Varices ?  ? ,   ?Endo/Other  ?negative endocrine ROS ? Renal/GU ?negative Renal ROS  ? ?  ?Musculoskeletal ?negative musculoskeletal ROS ?(+)  ? Abdominal ?(+) + obese,   ?Peds ? Hematology ?  ?Anesthesia Other Findings ? ? Reproductive/Obstetrics ? ?  ? ? ? ? ? ? ? ? ? ? ? ? ? ?  ?  ? ? ? ? ? ? ? ? ?Anesthesia Physical ? ?Anesthesia Plan ? ?ASA: III ? ?Anesthesia Plan: MAC  ? ?Post-op Pain Management: Minimal or no pain anticipated  ? ?Induction: Intravenous ? ?PONV Risk Score and Plan: 1 and Propofol infusion and Treatment may vary due to age or medical condition ? ?Airway Management Planned: Nasal Cannula ? ?Additional Equipment: None ? ?Intra-op Plan:  ? ?Post-operative Plan:  ? ?Informed Consent: I have reviewed the patients History and Physical, chart, labs and discussed the procedure including the risks, benefits and alternatives for the proposed anesthesia with the patient or authorized representative who has indicated his/her understanding and acceptance.  ? ? ? ? ? ?Plan Discussed with: CRNA ? ?Anesthesia Plan Comments:   ? ? ? ? ? ?Anesthesia Quick Evaluation ? ?

## 2021-09-11 NOTE — Anesthesia Procedure Notes (Signed)
Procedure Name: [place] ?Date/Time: 09/11/2021 7:39 AM ?Performed by: Lieutenant Diego, CRNA ?Pre-anesthesia Checklist: Patient identified, Emergency Drugs available, Suction available, Patient being monitored and Timeout performed ?Patient Re-evaluated:Patient Re-evaluated prior to induction ?Oxygen Delivery Method: Simple face mask ?Preoxygenation: Pre-oxygenation with 100% oxygen ?Induction Type: IV induction ? ? ? ? ?

## 2021-09-11 NOTE — Transfer of Care (Signed)
Immediate Anesthesia Transfer of Care Note ? ?Patient: Jason [name] ? ?Procedure(s) Performed: ESOPHAGOGASTRODUODENOSCOPY (EGD) WITH PROPOFOL ?BIOPSY ? ?Patient Location: PACU ? ?Anesthesia Type:MAC ? ?Level of Consciousness: awake ? ?Airway & Oxygen Therapy: Patient Spontanous Breathing and Patient connected to face mask oxygen ? ?Post-op Assessment: Report given to RN and Post -op Vital signs reviewed and stable ? ?Post vital signs: Reviewed and stable ? ?Last Vitals:  ?Vitals Value Taken Time  ?BP    ?Temp    ?Pulse 77 09/11/21 0753  ?Resp 13 09/11/21 0753  ?SpO2 99 % 09/11/21 0753  ?Vitals shown include unvalidated device data. ? ?Last Pain:  ?Vitals:  ? 09/11/21 0704  ?TempSrc: Oral  ?PainSc: 0-No pain  ?   ? ?  ? ?Complications: No notable events documented. ?

## 2021-09-11 NOTE — H&P (Signed)
? ?GASTROENTEROLOGY PROCEDURE H&P NOTE  ? ?Primary Care Physician: ?Marcine Matar, MD ? ? ? ?Reason for Procedure:  Cirrhosis, esophageal varices surveillance ? ?Plan:    EGD with possible variceal banding ? ?Patient is appropriate for endoscopic procedure(s) at [facility] Endoscopy unit. ? ?The nature of the procedure, as well as the risks, benefits, and alternatives were carefully and thoroughly reviewed with the patient. Ample time for discussion and questions allowed. The patient understood, was satisfied, and agreed to proceed.  ? ? ? ?HPI: ?Jason [name] is a 41 y.o. male who presents for EGD for variceal surveillance and possible banding. ? ?Endoscopic History: ?- EGD (08/10/19): Grade 2 EV with red wale- 2 bands placed. Mild PHG. Repeat 3-8 weeks. No BB due to hx of SBP ?- EGD (02/08/2020): Small post variceal banding scar, otherwise normal esophagus.  No recurrence of varices.  Mild gastritis. ? ?Past Medical History:  ?Diagnosis Date  ? Alcoholic cirrhosis of liver (HCC)   ? Anemia   ? Ankle fracture   ? Right  ? GERD (gastroesophageal reflux disease)   ? Hypertension   ? Nocturia   ? Thrombocytopenia (HCC)   ? ? ?Past Surgical History:  ?Procedure Laterality Date  ? BIOPSY  08/10/2019  ? Procedure: BIOPSY;  Surgeon: Shellia Cleverly, DO;  Location: [facility];  Service: Gastroenterology;;  ? BIOPSY  02/08/2020  ? Procedure: BIOPSY;  Surgeon: Shellia Cleverly, DO;  Location: [facility];  Service: Gastroenterology;;  ? ESOPHAGEAL BANDING  08/10/2019  ? Procedure: ESOPHAGEAL BANDING;  Surgeon: Shellia Cleverly, DO;  Location: [facility];  Service: Gastroenterology;;  ? ESOPHAGOGASTRODUODENOSCOPY (EGD) WITH PROPOFOL N/A 08/10/2019  ? Procedure: ESOPHAGOGASTRODUODENOSCOPY (EGD) WITH PROPOFOL;  Surgeon: Shellia Cleverly, DO;  Location: [facility];  Service: Gastroenterology;  Laterality: N/A;  ? ESOPHAGOGASTRODUODENOSCOPY (EGD) WITH PROPOFOL N/A 02/08/2020  ?  Procedure: ESOPHAGOGASTRODUODENOSCOPY (EGD) WITH PROPOFOL;  Surgeon: Shellia Cleverly, DO;  Location: [facility];  Service: Gastroenterology;  Laterality: N/A;  ? FOOT SURGERY Left   ? around 2014. a scar on lower leg  ? ORIF ANKLE FRACTURE Right 06/09/2019  ? Procedure: RIGHT OPEN REDUCTION INTERNAL FIXATION (ORIF) ANKLE FRACTURE;  Surgeon: Sheral Apley, MD;  Location: [facility];  Service: Orthopedics;  Laterality: Right;  ? ? ?Prior to Admission medications   ?Medication Sig Start Date End Date Taking? Authorizing Provider  ?bismuth subsalicylate (PEPTO BISMOL) 262 MG chewable tablet Chew 524 mg by mouth as needed for indigestion or diarrhea or loose stools.   Yes [provider]  ?famotidine (PEPCID) 20 MG tablet Take 1 tablet (20 mg total) by mouth daily. 07/10/21 07/10/22 Yes Sarin Comunale V, DO  ?folic acid (FOLVITE) 1 MG tablet Take 1 tablet (1 mg total) by mouth daily. 07/11/21  Yes Marcine Matar, MD  ?furosemide (LASIX) 40 MG tablet Take 1 tablet (40 mg total) by mouth daily. 07/11/21  Yes Marcine Matar, MD  ?ibuprofen (ADVIL) 200 MG tablet Take 200 mg by mouth every 6 (six) hours as needed for mild pain or moderate pain.   Yes [provider]  ?lactulose, encephalopathy, (CHRONULAC) 10 GM/15ML SOLN TAKE 30 MLS (20 G TOTAL) BY MOUTH 2 (TWO) TIMES DAILY. ?Patient taking differently: 10 g 2 (two) times daily. 02/27/21 02/27/22 Yes Marcine Matar, MD  ?Multiple Vitamin (MULTIVITAMIN WITH MINERALS) TABS tablet Take 1 tablet by mouth daily. 05/10/18  Yes Emokpae, Courage, MD  ?potassium chloride SA (KLOR-CON M) 20  MEQ tablet TAKE 1 TABLET (20 MEQ TOTAL) BY MOUTH 2 (TWO) TIMES DAILY. 05/15/21 05/15/22 Yes Marcine Matar, MD  ?spironolactone (ALDACTONE) 25 MG tablet Take 1 tablet (25 mg total) by mouth daily. 07/11/21  Yes Marcine Matar, MD  ?thiamine 100 MG tablet Take 1 tablet (100 mg total) by mouth daily. 06/10/18  Yes Marcine Matar, MD   ? ? ?Current Facility-Administered Medications  ?Medication Dose Route Frequency Provider Last Rate Last Admin  ? 0.9 %  sodium chloride infusion   Intravenous Continuous Rebie Peale V, DO      ? ? ?Allergies as of 08/10/2021  ? (No Known Allergies)  ? ? ?Family History  ?Problem Relation Age of Onset  ? Colon cancer Neg Hx   ? Esophageal cancer Neg Hx   ? Healthy Mother   ? Healthy Daughter   ? ? ?Social History  ? ?Socioeconomic History  ? Marital status: Legally Separated  ?  Spouse name: Not on file  ? Number of children: Not on file  ? Years of education: Not on file  ? Highest education level: Not on file  ?Occupational History  ? Occupation: Education administrator Conservation officer, historic buildings)  ?Tobacco Use  ? Smoking status: Former  ? Smokeless tobacco: Never  ? Tobacco comments:  ?  said he has tried it  ?Vaping Use  ? Vaping Use: Never used  ?Substance and Sexual Activity  ? Alcohol use: Not Currently  ?  Comment: 03/05/2018  ? Drug use: Never  ? Sexual activity: Yes  ?Other Topics Concern  ? Not on file  ?Social History Narrative  ? ** Merged History Encounter **  ?    ? Lives in [place] with wife and daughter.   ? ?Social Determinants of Health  ? ?Financial Resource Strain: Not on file  ?Food Insecurity: Not on file  ?Transportation Needs: Not on file  ?Physical Activity: Not on file  ?Stress: Not on file  ?Social Connections: Not on file  ?Intimate Partner Violence: Not on file  ? ? ?Physical Exam: ?Vital signs in last 24 hours: ?@BP  (!) 143/71   Pulse 71   Temp 98.4 ?F (36.9 ?C) (Oral)   Resp (!) 21   Ht 5\' 5"  (1.651 m)   Wt 110.5 kg   SpO2 99%   BMI 40.54 kg/m?  ?GEN: NAD ?EYE: Sclerae anicteric ?ENT: MMM ?CV: Non-tachycardic ?Pulm: CTA b/l ?GI: Soft, NT/ND ?NEURO:  Alert & Oriented x 3 ? ? ? , DO ?[facility] ? ? ?09/11/2021 7:18 AM ? ?

## 2021-09-11 NOTE — Op Note (Signed)
[name] ?Patient Name: Jason LittlesJose Arriola [name] ?Procedure Date : 09/11/2021 ?MRN: [id number] ?Attending MD: Doristine LocksVito Sherrilyn Nairn , MD ?Date of Birth: [birth year] ?CSN: [id number] ?Age: 41 ?Admit Type: Outpatient ?Procedure:                Upper GI endoscopy ?Indications:              Cirrhosis rule out esophageal varices, Follow-up of  ?                          esophageal varices ?                          41 yo male with history of cirrhosis and esophageal  ?                          varices, s/p banding in 2021, presents for EGD for  ?                          ongoing surveillance. ?Providers:                Doristine LocksVito Mima Cranmore, MD, Lorenza EvangelistMeredith Walton, RN, Debbora PrestoJustina  ?                          Lyman BishopLawrence, Technician ?Referring MD:              ?Medicines:                Monitored Anesthesia Care ?Complications:            No immediate complications. ?Estimated Blood Loss:     Estimated blood loss was minimal. ?Procedure:                Pre-Anesthesia Assessment: ?                          - Prior to the procedure, a History and Physical  ?                          was performed, and patient medications and  ?                          allergies were reviewed. The patient's tolerance of  ?                          previous anesthesia was also reviewed. The risks  ?                          and benefits of the procedure and the sedation  ?                          options and risks were discussed with the patient.  ?                          All questions were answered, and informed consent  ?                          was  obtained. Prior Anticoagulants: The patient has  ?                          taken no previous anticoagulant or antiplatelet  ?                          agents. ASA Grade Assessment: III - A patient with  ?                          severe systemic disease. After reviewing the risks  ?                          and benefits, the patient was deemed in  ?                          satisfactory condition  to undergo the procedure. ?                          After obtaining informed consent, the endoscope was  ?                          passed under direct vision. Throughout the  ?                          procedure, the patient's blood pressure, pulse, and  ?                          oxygen saturations were monitored continuously. The  ?                          [id number]-H190 ([id number]) Olympus endoscope was introduced  ?                          through the mouth, and advanced to the second part  ?                          of duodenum. The upper GI endoscopy was  ?                          accomplished without difficulty. The patient  ?                          tolerated the procedure well. ?Scope In: ?Scope Out: ?Findings: ?     The examined esophagus was normal. There was a faint scar in the lower  ?     esophagus consistent with prior banding site. No recurrence of  ?     esophageal varices. ?     The Z-line was regular and was found 40 cm from the incisors. ?     Patchy mild inflammation characterized by congestion (edema) and  ?     erythema was found in the gastric fundus and in the gastric body.  ?     Biopsies were taken with a cold forceps for Helicobacter pylori testing.  ?     Estimated blood loss was minimal. ?  The examined duodenum was normal. ?Impression:               - Normal esophagus. ?                          - Z-line regular, 40 cm from the incisors. ?                          - Gastritis. Biopsied. ?                          - Normal examined duodenum. ?Recommendation:           - Patient has a contact number available for  ?                          emergencies. The signs and symptoms of potential  ?                          delayed complications were discussed with the  ?                          patient. Return to normal activities tomorrow.  ?                          Written discharge instructions were provided to the  ?                          patient. ?                          - Resume  previous diet. ?                          - Continue present medications. ?                          - Await pathology results. ?                          - Repeat upper endoscopy in 2 years for continued  ?                          surveillance. ?                          - Follow-up in the [facility] as  ?                          planned. ?Procedure Code(s):        --- Professional --- ?                          [phone], Esophagogastroduodenoscopy, flexible,  ?                          transoral; with biopsy, single or multiple ?Diagnosis Code(s):        --- Professional --- ?  K29.70, Gastritis, unspecified, without bleeding ?                          K74.60, Unspecified cirrhosis of liver ?                          I85.00, Esophageal varices without bleeding ?CPT copyright 2019 American Medical Association. All rights reserved. ?The codes documented in this report are preliminary and upon coder review may  ?be revised to meet current compliance requirements. ?Doristine Locks, MD ?09/11/2021 8:03:39 AM ?Number of Addenda: 0 ?

## 2021-09-11 NOTE — Progress Notes (Signed)
Patient admitted with the use of stratus.  Interpreter id [id number]. ?

## 2021-09-11 NOTE — Anesthesia Postprocedure Evaluation (Signed)
Anesthesia Post Note ? ?Patient: Jason [name] ? ?Procedure(s) Performed: ESOPHAGOGASTRODUODENOSCOPY (EGD) WITH PROPOFOL ?BIOPSY ? ?  ? ?Patient location during evaluation: PACU ?Anesthesia Type: MAC ?Level of consciousness: awake and alert ?Pain management: pain level controlled ?Vital Signs Assessment: post-procedure vital signs reviewed and stable ?Respiratory status: spontaneous breathing, nonlabored ventilation and respiratory function stable ?Cardiovascular status: blood pressure returned to baseline and stable ?Postop Assessment: no apparent nausea or vomiting ?Anesthetic complications: no ? ? ?No notable events documented. ? ?Last Vitals:  ?Vitals:  ? 09/11/21 0752 09/11/21 0807  ?BP: 118/70 117/66  ?Pulse: 76 68  ?Resp: 14 13  ?Temp: 36.8 ?C   ?SpO2: 99% 96%  ?  ?Last Pain:  ?Vitals:  ? 09/11/21 0752  ?TempSrc:   ?PainSc: 0-No pain  ? ? ?  ?  ?  ?  ?  ?  ? ?Lynda Rainwater ? ? ? ? ?

## 2021-09-12 ENCOUNTER — Other Ambulatory Visit: Payer: Self-pay | Admitting: Internal Medicine

## 2021-09-12 ENCOUNTER — Encounter (HOSPITAL_COMMUNITY): Payer: Self-pay | Admitting: Gastroenterology

## 2021-09-12 ENCOUNTER — Other Ambulatory Visit: Payer: Self-pay

## 2021-09-12 DIAGNOSIS — K7031 Alcoholic cirrhosis of liver with ascites: Secondary | ICD-10-CM

## 2021-09-12 MED ORDER — LACTULOSE 10 GM/15ML PO SOLN
ORAL | 1 refills | Status: DC
  Filled 2021-09-12: qty 1892, 30d supply, fill #0
  Filled 2021-10-17: qty 1892, 30d supply, fill #1

## 2021-09-13 ENCOUNTER — Other Ambulatory Visit: Payer: Self-pay

## 2021-09-13 LAB — SURGICAL PATHOLOGY

## 2021-09-13 MED ORDER — POTASSIUM CHLORIDE CRYS ER 20 MEQ PO TBCR
EXTENDED_RELEASE_TABLET | Freq: Two times a day (BID) | ORAL | 1 refills | Status: DC
  Filled 2021-09-13: qty 60, 30d supply, fill #0

## 2021-09-15 ENCOUNTER — Other Ambulatory Visit: Payer: Self-pay

## 2021-09-18 ENCOUNTER — Other Ambulatory Visit: Payer: Self-pay | Admitting: Nurse Practitioner

## 2021-09-18 ENCOUNTER — Other Ambulatory Visit: Payer: Self-pay

## 2021-09-18 ENCOUNTER — Other Ambulatory Visit (HOSPITAL_COMMUNITY): Payer: Self-pay | Admitting: Nurse Practitioner

## 2021-09-18 DIAGNOSIS — K7031 Alcoholic cirrhosis of liver with ascites: Secondary | ICD-10-CM

## 2021-09-18 DIAGNOSIS — I272 Pulmonary hypertension, unspecified: Secondary | ICD-10-CM | POA: Insufficient documentation

## 2021-10-03 ENCOUNTER — Other Ambulatory Visit (HOSPITAL_COMMUNITY): Payer: Self-pay | Admitting: Nurse Practitioner

## 2021-10-03 ENCOUNTER — Ambulatory Visit (HOSPITAL_COMMUNITY)
Admission: RE | Admit: 2021-10-03 | Discharge: 2021-10-03 | Disposition: A | Discharge status: Home / Self Care | Payer: 59 | Source: Ambulatory Visit | Attending: Nurse Practitioner | Admitting: Nurse Practitioner

## 2021-10-03 DIAGNOSIS — K7031 Alcoholic cirrhosis of liver with ascites: Secondary | ICD-10-CM | POA: Insufficient documentation

## 2021-10-03 DIAGNOSIS — I10 Essential (primary) hypertension: Secondary | ICD-10-CM | POA: Insufficient documentation

## 2021-10-03 DIAGNOSIS — Z87891 Personal history of nicotine dependence: Secondary | ICD-10-CM | POA: Insufficient documentation

## 2021-10-03 LAB — ECHOCARDIOGRAM COMPLETE BUBBLE STUDY
AR max vel: 2.13 cm2
AV Peak grad: 10 mmHg
Ao pk vel: 1.58 m/s
Area-P 1/2: 3.58 cm2
Calc EF: 57.5 %
S' Lateral: 3.7 cm
Single Plane A2C EF: 59.6 %
Single Plane A4C EF: 56.5 %

## 2021-10-09 ENCOUNTER — Encounter (HOSPITAL_COMMUNITY): Payer: Self-pay

## 2021-10-09 ENCOUNTER — Inpatient Hospital Stay (HOSPITAL_COMMUNITY)
Admission: EM | Admit: 2021-10-09 | Discharge: 2021-10-12 | DRG: 433 | Disposition: A | Discharge status: Home / Self Care | Payer: 59 | Attending: Internal Medicine | Admitting: Internal Medicine

## 2021-10-09 ENCOUNTER — Other Ambulatory Visit: Payer: Self-pay

## 2021-10-09 ENCOUNTER — Emergency Department (HOSPITAL_COMMUNITY): Payer: 59

## 2021-10-09 DIAGNOSIS — E669 Obesity, unspecified: Secondary | ICD-10-CM | POA: Diagnosis present

## 2021-10-09 DIAGNOSIS — R519 Headache, unspecified: Secondary | ICD-10-CM | POA: Diagnosis present

## 2021-10-09 DIAGNOSIS — D6959 Other secondary thrombocytopenia: Secondary | ICD-10-CM | POA: Diagnosis present

## 2021-10-09 DIAGNOSIS — K7030 Alcoholic cirrhosis of liver without ascites: Secondary | ICD-10-CM | POA: Diagnosis not present

## 2021-10-09 DIAGNOSIS — Z6841 Body Mass Index (BMI) 40.0 and over, adult: Secondary | ICD-10-CM

## 2021-10-09 DIAGNOSIS — R21 Rash and other nonspecific skin eruption: Secondary | ICD-10-CM | POA: Diagnosis present

## 2021-10-09 DIAGNOSIS — R109 Unspecified abdominal pain: Secondary | ICD-10-CM | POA: Diagnosis present

## 2021-10-09 DIAGNOSIS — R509 Fever, unspecified: Principal | ICD-10-CM

## 2021-10-09 DIAGNOSIS — T501X5A Adverse effect of loop [high-ceiling] diuretics, initial encounter: Secondary | ICD-10-CM | POA: Diagnosis present

## 2021-10-09 DIAGNOSIS — R1084 Generalized abdominal pain: Secondary | ICD-10-CM | POA: Diagnosis not present

## 2021-10-09 DIAGNOSIS — Z87891 Personal history of nicotine dependence: Secondary | ICD-10-CM

## 2021-10-09 DIAGNOSIS — D696 Thrombocytopenia, unspecified: Secondary | ICD-10-CM | POA: Diagnosis present

## 2021-10-09 DIAGNOSIS — F1021 Alcohol dependence, in remission: Secondary | ICD-10-CM | POA: Diagnosis present

## 2021-10-09 DIAGNOSIS — Z79899 Other long term (current) drug therapy: Secondary | ICD-10-CM

## 2021-10-09 DIAGNOSIS — I10 Essential (primary) hypertension: Secondary | ICD-10-CM | POA: Diagnosis present

## 2021-10-09 DIAGNOSIS — Z20822 Contact with and (suspected) exposure to covid-19: Secondary | ICD-10-CM | POA: Diagnosis present

## 2021-10-09 DIAGNOSIS — L03116 Cellulitis of left lower limb: Secondary | ICD-10-CM | POA: Diagnosis present

## 2021-10-09 DIAGNOSIS — E876 Hypokalemia: Secondary | ICD-10-CM | POA: Diagnosis present

## 2021-10-09 DIAGNOSIS — B349 Viral infection, unspecified: Secondary | ICD-10-CM | POA: Diagnosis present

## 2021-10-09 DIAGNOSIS — K219 Gastro-esophageal reflux disease without esophagitis: Secondary | ICD-10-CM | POA: Diagnosis present

## 2021-10-09 DIAGNOSIS — K7460 Unspecified cirrhosis of liver: Secondary | ICD-10-CM

## 2021-10-09 HISTORY — DX: Other ascites: R18.8

## 2021-10-09 HISTORY — DX: Alcoholic hepatitis with ascites: K70.11

## 2021-10-09 LAB — COMPREHENSIVE METABOLIC PANEL
ALT: 38 U/L (ref 0–44)
AST: 55 U/L — ABNORMAL HIGH (ref 15–41)
Albumin: 3.4 g/dL — ABNORMAL LOW (ref 3.5–5.0)
Alkaline Phosphatase: 134 U/L — ABNORMAL HIGH (ref 38–126)
Anion gap: 8 (ref 5–15)
BUN: 10 mg/dL (ref 6–20)
CO2: 20 mmol/L — ABNORMAL LOW (ref 22–32)
Calcium: 8.8 mg/dL — ABNORMAL LOW (ref 8.9–10.3)
Chloride: 111 mmol/L (ref 98–111)
Creatinine, Ser: 0.58 mg/dL — ABNORMAL LOW (ref 0.61–1.24)
GFR, Estimated: >60 mL/min (ref >60)
Glucose, Bld: 116 mg/dL — ABNORMAL HIGH (ref 70–99)
Potassium: 3 mmol/L — ABNORMAL LOW (ref 3.5–5.1)
Sodium: 139 mmol/L (ref 135–145)
Total Bilirubin: 2.2 mg/dL — ABNORMAL HIGH (ref 0.3–1.2)
Total Protein: 6.5 g/dL (ref 6.5–8.1)

## 2021-10-09 LAB — URINALYSIS, ROUTINE W REFLEX MICROSCOPIC
Bacteria, UA: NONE SEEN
Bilirubin Urine: NEGATIVE
Glucose, UA: NEGATIVE mg/dL
Ketones, ur: NEGATIVE mg/dL
Leukocytes,Ua: NEGATIVE
Nitrite: NEGATIVE
Protein, ur: NEGATIVE mg/dL
Specific Gravity, Urine: 1.008 (ref 1.005–1.030)
pH: 7 (ref 5.0–8.0)

## 2021-10-09 LAB — CBC
HCT: 42.3 % (ref 39.0–52.0)
Hemoglobin: 15.2 g/dL (ref 13.0–17.0)
MCH: 33.7 pg (ref 26.0–34.0)
MCHC: 35.9 g/dL (ref 30.0–36.0)
MCV: 93.8 fL (ref 80.0–100.0)
Platelets: 43 10*3/uL — ABNORMAL LOW (ref 150–400)
RBC: 4.51 MIL/uL (ref 4.22–5.81)
RDW: 14.3 % (ref 11.5–15.5)
WBC: 12.2 10*3/uL — ABNORMAL HIGH (ref 4.0–10.5)
nRBC: 0 % (ref 0.0–0.2)

## 2021-10-09 LAB — LIPASE, BLOOD: Lipase: 35 U/L (ref 11–51)

## 2021-10-09 LAB — LACTIC ACID, PLASMA: Lactic Acid, Venous: 1.4 mmol/L (ref 0.5–1.9)

## 2021-10-09 MED ORDER — IOHEXOL 300 MG/ML  SOLN
100.0000 mL | Freq: Once | INTRAMUSCULAR | Status: AC | PRN
  Administered 2021-10-09: 100 mL via INTRAVENOUS

## 2021-10-09 MED ORDER — SODIUM CHLORIDE 0.9 % IV SOLN: INTRAVENOUS | Status: DC

## 2021-10-09 MED ORDER — ACETAMINOPHEN 325 MG PO TABS
650.0000 mg | ORAL_TABLET | Freq: Once | ORAL | Status: DC
  Filled 2021-10-09: qty 2

## 2021-10-09 MED ORDER — METRONIDAZOLE 500 MG/100ML IV SOLN
500.0000 mg | Freq: Two times a day (BID) | INTRAVENOUS | Status: DC
  Administered 2021-10-09: 500 mg via INTRAVENOUS
  Filled 2021-10-09: qty 100

## 2021-10-09 MED ORDER — SODIUM CHLORIDE 0.9 % IV SOLN
2.0000 g | Freq: Once | INTRAVENOUS | Status: DC
  Administered 2021-10-09: 2 g via INTRAVENOUS
  Filled 2021-10-09: qty 20

## 2021-10-09 MED ORDER — ONDANSETRON HCL 4 MG/2ML IJ SOLN
4.0000 mg | Freq: Once | INTRAMUSCULAR | Status: AC
  Administered 2021-10-09: 4 mg via INTRAVENOUS
  Filled 2021-10-09: qty 2

## 2021-10-09 MED ORDER — HYDROMORPHONE HCL 1 MG/ML IJ SOLN
1.0000 mg | Freq: Once | INTRAMUSCULAR | Status: AC
  Administered 2021-10-09: 1 mg via INTRAVENOUS
  Filled 2021-10-09: qty 1

## 2021-10-09 MED ORDER — PIPERACILLIN-TAZOBACTAM 3.375 G IVPB 30 MIN
3.3750 g | Freq: Once | INTRAVENOUS | Status: AC
  Administered 2021-10-10: 3.375 g via INTRAVENOUS
  Filled 2021-10-09: qty 50

## 2021-10-09 NOTE — ED Provider Notes (Signed)
[facility] Provider Note   CSN: [id number] Arrival date & time: 10/09/21  1521     History  Chief Complaint  Patient presents with   Abdominal Pain    [facility] Cand[facility] Furb[name] is a 41 y.o. male.  HPI Patient presents for evaluation of fever, chills, vomiting, abdominal pain.  Onset of symptoms several days ago.  Is been no blood in the emesis.  He denies sneezing, sore throat, cough, diarrhea, dysuria or urinary frequency.  [facility] and a GI doctor in town here.   An abdominal [facility], results below; 1.  Nodular contour of the liver which can be seen in cirrhosis.  2.  Collateral vessels adjacent to the left lobe of the liver, which can be seen in portal hypertension.  3.  No hepatic lesion identified.  4.  Nonobstructive right renal stones measuring up to 1.2 cm in the interpolar region and 1.1 cm in the lower pole.  Home Medications Prior to Admission medications   Medication Sig Start Date End Date Taking? Authorizing Provider  bismuth subsalicylate (PEPTO BISMOL) 262 MG chewable tablet Chew 524 mg by mouth as needed for indigestion or diarrhea or loose stools.    [provider]  famotidine (PEPCID) 20 MG tablet Take 1 tablet (20 mg total) by mouth daily. 07/10/21 07/10/22  Cirigliano, Vito V, DO  folic acid (FOLVITE) 1 MG tablet Take 1 tablet (1 mg total) by mouth daily. 07/11/21   Marcine MatarJohnson, Deborah B, MD  furosemide (LASIX) 40 MG tablet Take 1 tablet (40 mg total) by mouth daily. 07/11/21   Marcine MatarJohnson, Deborah B, MD  ibuprofen (ADVIL) 200 MG tablet Take 200 mg by mouth every 6 (six) hours as needed for mild pain or moderate pain.    [provider]  lactulose (CHRONULAC) 10 GM/15ML solution TAKE 30 MLS BY MOUTH 2 TIMES DAILY 02/27/21   Marcine MatarJohnson, Deborah B, MD  Multiple Vitamin (MULTIVITAMIN WITH MINERALS) TABS  tablet Take 1 tablet by mouth daily. 05/10/18   Shon HaleEmokpae, Courage, MD  potassium chloride SA (KLOR-CON M) 20 MEQ tablet TAKE 1 TABLET (20 MEQ TOTAL) BY MOUTH 2 (TWO) TIMES DAILY. 09/13/21 09/13/22  Marcine MatarJohnson, Deborah B, MD  spironolactone (ALDACTONE) 25 MG tablet Take 1 tablet (25 mg total) by mouth daily. 07/11/21   Marcine MatarJohnson, Deborah B, MD  thiamine 100 MG tablet Take 1 tablet (100 mg total) by mouth daily. 06/10/18   Marcine MatarJohnson, Deborah B, MD      Allergies    Patient has no known allergies.    Review of Systems   Review of Systems  Physical Exam Updated Vital Signs BP 131/71 (BP Location: Right Arm)   Pulse 94   Temp 100.3 F (37.9 C) (Oral)   Resp 18   SpO2 98%  Physical Exam Vitals and nursing note reviewed.  Constitutional:      General: He is in acute distress (Uncomfortable).     Appearance: He is well-developed. He is not ill-appearing or diaphoretic.  HENT:     Head: Normocephalic and atraumatic.     Right Ear: External ear normal.     Left Ear: External ear normal.     Mouth/Throat:     Mouth: Mucous membranes are moist.  Eyes:     Conjunctiva/sclera: Conjunctivae normal.     Pupils: Pupils are equal, round, and reactive to light.  Neck:  Trachea: Phonation normal.  Cardiovascular:     Rate and Rhythm: Normal rate and regular rhythm.     Heart sounds: Normal heart sounds.  Pulmonary:     Effort: Pulmonary effort is normal. No respiratory distress.     Breath sounds: Normal breath sounds. No stridor.  Abdominal:     General: There is no distension.     Palpations: Abdomen is soft. There is no mass.     Tenderness: There is abdominal tenderness (Diffuse, moderate). There is no guarding.     Hernia: No hernia is present.  Musculoskeletal:        General: Normal range of motion.     Cervical back: Normal range of motion and neck supple.     Right lower leg: No edema.     Left lower leg: No edema.  Skin:    General: Skin is warm and dry.     Coloration: Skin is not  jaundiced or pale.  Neurological:     Mental Status: He is alert and oriented to person, place, and time.     Cranial Nerves: No cranial nerve deficit.     Sensory: No sensory deficit.     Motor: No abnormal muscle tone.     Coordination: Coordination normal.  Psychiatric:        Mood and Affect: Mood normal.        Behavior: Behavior normal.        Thought Content: Thought content normal.        Judgment: Judgment normal.     ED Results / Procedures / Treatments   Labs (all labs ordered are listed, but only abnormal results are displayed) Labs Reviewed  COMPREHENSIVE METABOLIC PANEL - Abnormal; Notable for the following components:      Result Value   Potassium 3.0 (*)    CO2 20 (*)    Glucose, Bld 116 (*)    Creatinine, Ser 0.58 (*)    Calcium 8.8 (*)    Albumin 3.4 (*)    AST 55 (*)    Alkaline Phosphatase 134 (*)    Total Bilirubin 2.2 (*)    All other components within normal limits  CBC - Abnormal; Notable for the following components:   WBC 12.2 (*)    Platelets 43 (*)    All other components within normal limits  URINALYSIS, ROUTINE W REFLEX MICROSCOPIC - Abnormal; Notable for the following components:   Hgb urine dipstick SMALL (*)    All other components within normal limits  CULTURE, BLOOD (ROUTINE X 2)  CULTURE, BLOOD (ROUTINE X 2)  LIPASE, BLOOD  LACTIC ACID, PLASMA  LACTIC ACID, PLASMA    EKG None  Radiology CT ABDOMEN PELVIS W CONTRAST  Result Date: 10/09/2021 CLINICAL DATA:  Abdominal pain, acute, nonlocalized. Fever. History cirrhosis and SBP. EXAM: CT ABDOMEN AND PELVIS WITH CONTRAST TECHNIQUE: Multidetector CT imaging of the abdomen and pelvis was performed using the standard protocol following bolus administration of intravenous contrast. RADIATION DOSE REDUCTION: This exam was performed according to the departmental dose-optimization program which includes automated exposure control, adjustment of the mA and/or kV according to patient size  and/or use of iterative reconstruction technique. CONTRAST:  OMNIPAQUE IOHEXOL 300 MG/ML  SOLN COMPARISON:  Abdominopelvic CT without contrast 09/30/2020 and ultrasound 04/03/2021. Abdominal MRI 12/08/2018. FINDINGS: Lower chest: Clear lung bases. No significant pleural or pericardial effusion. Hepatobiliary: Diffuse contour irregularity of the liver consistent with cirrhosis again noted. A 9 mm cyst in the right hepatic  lobe on image 15/2 is unchanged. No suspicious liver lesions are identified, although the contrast bolus is suboptimal. Mild nonspecific gallbladder wall thickening. No evidence of gallstones or biliary dilatation. Pancreas: Unremarkable. No pancreatic ductal dilatation or surrounding inflammatory changes. Spleen: Mildly progressive splenomegaly. No focal abnormality identified. Adrenals/Urinary Tract: Both adrenal glands appear normal. Numerous nonobstructing bilateral renal calculi are again noted. No evidence of ureteral calculus, hydronephrosis or perinephric soft tissue stranding. The bladder remains incompletely distended, likely accounting for mild wall thickening which appears unchanged. No focal abnormality identified. Stomach/Bowel: No enteric contrast administered. The stomach appears unremarkable for its degree of distension. No evidence of bowel wall thickening, distention or surrounding inflammatory change. The appendix appears normal. Vascular/Lymphatic: There are no enlarged abdominal or pelvic lymph nodes. No acute vascular findings are identified. The portal vein remains distended, and there is a recanalized left paraumbilical vein with multiple dilated intra-abdominal venous collaterals. Reproductive: The prostate gland and seminal vesicles appear normal. Other: No ascites, free air or focal extraluminal fluid collection. Musculoskeletal: No acute osseous findings are identified. Stable findings of right greater than left developmental hip dysplasia with secondary  degenerative changes. There is facet arthropathy in the lower lumbar spine. IMPRESSION: 1. No acute findings or clear explanation for the patient's symptoms. 2. 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 3. Numerous nonobstructing bilateral renal calculi. No evidence of ureteral calculus or hydronephrosis. 4. Stable chronic osseous findings of bilateral hip dysplasia and lumbar facet arthropathy. Electronically Signed   By: Carey Bullocks M.D.   On: 10/09/2021 17:10    Procedures Procedures    Medications Ordered in ED Medications  iohexol (OMNIPAQUE) 300 MG/ML solution 100 mL (100 mLs Intravenous Contrast Given 10/09/21 1651)    ED Course/ Medical Decision Making/ A&P                           Medical Decision Making Patient with known cirrhosis, presenting with abdominal pain with chills.  No respiratory symptoms.  Ultrasound done today at [facility] did not show any acute abnormalities.  There was no ascites.  No localizing symptoms for cause of fever.  Problems Addressed: Cirrhosis of liver without ascites, unspecified hepatic cirrhosis type [facility]): chronic illness or injury    Details: No ascites on ultrasound imaging or CT imaging done today. Fever, unspecified fever cause: acute illness or injury Generalized abdominal pain: acute illness or injury  Amount and/or Complexity of Data Reviewed Independent Historian:     Details: Cogent historian interviewed with translator using language service video External Data Reviewed: notes.    Details: Results from [facility], in the EMR. Labs: ordered.    Details: CBC, metabolic panel, urinalysis, blood culture, lipase, lactic acid-normal except potassium low, glucose high, calcium low, albumin low, AST high, alkaline phosphatase high, total bilirubin high, white count high Radiology: ordered and independent  interpretation performed.    Details: CT abdomen pelvis-no acute abnormalities  Risk Prescription drug management. Decision regarding hospitalization. Risk Details: Patient with fever, abdominal pain.  CT imaging does not reveal source.  No ascitic fluid to be able to be obtained to rule out spontaneous bacterial peritonitis.  Initial screening, elevated white count, normal CT, normal urinalysis.  Added on testing chest x-ray and viral panel.  IV fluids and pain medicine given.  Patient may require admission for further evaluation and treatment.  Transition of care to  Dr. Pilar Plate, to follow-up on testing results and arrange for disposition.           Final Clinical Impression(s) / ED Diagnoses Final diagnoses:  None    Rx / DC Orders ED Discharge Orders     None         Mancel Bale, MD 10/09/21 2302

## 2021-10-09 NOTE — ED Provider Triage Note (Signed)
Emergency Medicine Provider Triage Evaluation Note  [facility] Jason [name] , a 41 y.o. male  was evaluated in triage.  Pt complains of abdominal pain, vomiting.  History of alcoholic cirrhosis, states he is no longer drinking.  Nonbloody emesis over the last few days, generalized abdominal pain.  No recent falls.  Followed by [facility] liver team.  Apparently he has history of SBP and RE-compensated cirrhosis. Hx sepsis.  Known esophageal varices  EGD May 2023 no residual varices after banding. Denies any blood in his emesis or dark tarry stool  Review of Systems  Positive: Nausea, vomiting, abdominal pain Negative: Bloody stools  Physical Exam  BP 140/84   Pulse (!) 121   Temp 100.3 F (37.9 C) (Oral)   Resp 16   SpO2 94%  Gen:   Awake, no distress   Resp:  Normal effort  Cardiac: Tachycardic MSK:   Moves extremities without difficulty  ABD:  Distended abdomen Other:    Medical Decision Making  Medically screening exam initiated at 3:43 PM.  Appropriate orders placed.  Jason [name] was informed that the remainder of the evaluation will be completed by another provider, this initial triage assessment does not replace that evaluation, and the importance of remaining in the ED until their evaluation is complete.  Nausea, vomiting, abdominal pain.  Patient temperature 100.3, tachycardic.  Sepsis labs placed.  Nursing aware patient needs room in  back.   Magally Vahle A, PA-C 10/09/21 1548

## 2021-10-09 NOTE — ED Provider Notes (Signed)
  Provider Note MRN:  [id number]  Arrival date & time: 10/09/21    ED Course and Medical Decision Making  Assumed care from Dr. Eulis Foster at shift change.  Generalized abdominal pain, history of cirrhosis here with fever as well.  SBP considered however no evidence of ascites and paracentesis deferred at this time.  No other obvious source of infection found, starting empiric antibiotics for intra-abdominal coverage, plan is for admission.  Procedures  Final Clinical Impressions(s) / ED Diagnoses     ICD-10-CM   1. Fever, unspecified fever cause  R50.9     2. Generalized abdominal pain  R10.84     3. Cirrhosis of liver without ascites, unspecified hepatic cirrhosis type [facility])  K74.60       ED Discharge Orders     None       Discharge Instructions   None     Barth Kirks. Sedonia Small, [place] [email or web address]    Maudie Flakes, MD 10/09/21 2329

## 2021-10-09 NOTE — ED Notes (Signed)
Tylenol withheld due to family advising pt could not take due to cirrhosis provider is advised and advised to withhold

## 2021-10-09 NOTE — ED Triage Notes (Signed)
Pt BIB EMS from home. Pt reports abdominal pain, N/V, and headache. Hx of cirrhosis. Pt recently stopped drinking.

## 2021-10-10 ENCOUNTER — Encounter (HOSPITAL_COMMUNITY): Payer: Self-pay | Admitting: Internal Medicine

## 2021-10-10 ENCOUNTER — Observation Stay (HOSPITAL_COMMUNITY): Payer: 59

## 2021-10-10 DIAGNOSIS — R509 Fever, unspecified: Secondary | ICD-10-CM

## 2021-10-10 DIAGNOSIS — K766 Portal hypertension: Secondary | ICD-10-CM | POA: Diagnosis not present

## 2021-10-10 DIAGNOSIS — D6959 Other secondary thrombocytopenia: Secondary | ICD-10-CM | POA: Diagnosis present

## 2021-10-10 DIAGNOSIS — R1084 Generalized abdominal pain: Secondary | ICD-10-CM

## 2021-10-10 DIAGNOSIS — F1021 Alcohol dependence, in remission: Secondary | ICD-10-CM | POA: Diagnosis present

## 2021-10-10 DIAGNOSIS — K219 Gastro-esophageal reflux disease without esophagitis: Secondary | ICD-10-CM | POA: Diagnosis present

## 2021-10-10 DIAGNOSIS — Z87891 Personal history of nicotine dependence: Secondary | ICD-10-CM | POA: Diagnosis not present

## 2021-10-10 DIAGNOSIS — R21 Rash and other nonspecific skin eruption: Secondary | ICD-10-CM | POA: Diagnosis present

## 2021-10-10 DIAGNOSIS — B349 Viral infection, unspecified: Secondary | ICD-10-CM | POA: Diagnosis present

## 2021-10-10 DIAGNOSIS — D696 Thrombocytopenia, unspecified: Secondary | ICD-10-CM

## 2021-10-10 DIAGNOSIS — R109 Unspecified abdominal pain: Secondary | ICD-10-CM | POA: Diagnosis present

## 2021-10-10 DIAGNOSIS — Z20822 Contact with and (suspected) exposure to covid-19: Secondary | ICD-10-CM | POA: Diagnosis present

## 2021-10-10 DIAGNOSIS — L03116 Cellulitis of left lower limb: Secondary | ICD-10-CM | POA: Diagnosis present

## 2021-10-10 DIAGNOSIS — K7030 Alcoholic cirrhosis of liver without ascites: Secondary | ICD-10-CM | POA: Diagnosis present

## 2021-10-10 DIAGNOSIS — E876 Hypokalemia: Secondary | ICD-10-CM

## 2021-10-10 DIAGNOSIS — R1013 Epigastric pain: Secondary | ICD-10-CM

## 2021-10-10 DIAGNOSIS — Z79899 Other long term (current) drug therapy: Secondary | ICD-10-CM | POA: Diagnosis not present

## 2021-10-10 DIAGNOSIS — T501X5A Adverse effect of loop [high-ceiling] diuretics, initial encounter: Secondary | ICD-10-CM | POA: Diagnosis present

## 2021-10-10 DIAGNOSIS — R519 Headache, unspecified: Secondary | ICD-10-CM | POA: Diagnosis present

## 2021-10-10 DIAGNOSIS — E669 Obesity, unspecified: Secondary | ICD-10-CM | POA: Diagnosis present

## 2021-10-10 DIAGNOSIS — Z6841 Body Mass Index (BMI) 40.0 and over, adult: Secondary | ICD-10-CM | POA: Diagnosis not present

## 2021-10-10 DIAGNOSIS — I10 Essential (primary) hypertension: Secondary | ICD-10-CM | POA: Diagnosis present

## 2021-10-10 LAB — CBC WITH DIFFERENTIAL/PLATELET
Abs Immature Granulocytes: 0.11 10*3/uL — ABNORMAL HIGH (ref 0.00–0.07)
Basophils Absolute: 0 10*3/uL (ref 0.0–0.1)
Basophils Relative: 0 %
Eosinophils Absolute: 0 10*3/uL (ref 0.0–0.5)
Eosinophils Relative: 0 %
HCT: 40.1 % (ref 39.0–52.0)
Hemoglobin: 13.7 g/dL (ref 13.0–17.0)
Immature Granulocytes: 1 %
Lymphocytes Relative: 7 %
Lymphs Abs: 0.7 10*3/uL (ref 0.7–4.0)
MCH: 33.1 pg (ref 26.0–34.0)
MCHC: 34.2 g/dL (ref 30.0–36.0)
MCV: 96.9 fL (ref 80.0–100.0)
Monocytes Absolute: 1 10*3/uL (ref 0.1–1.0)
Monocytes Relative: 11 %
Neutro Abs: 7.6 10*3/uL (ref 1.7–7.7)
Neutrophils Relative %: 81 %
Platelets: 34 10*3/uL — ABNORMAL LOW (ref 150–400)
RBC: 4.14 MIL/uL — ABNORMAL LOW (ref 4.22–5.81)
RDW: 14.6 % (ref 11.5–15.5)
WBC: 9.4 10*3/uL (ref 4.0–10.5)
nRBC: 0 % (ref 0.0–0.2)

## 2021-10-10 LAB — PROCALCITONIN: Procalcitonin: 1.64 ng/mL

## 2021-10-10 LAB — COMPREHENSIVE METABOLIC PANEL
ALT: 36 U/L (ref 0–44)
AST: 60 U/L — ABNORMAL HIGH (ref 15–41)
Albumin: 3.1 g/dL — ABNORMAL LOW (ref 3.5–5.0)
Alkaline Phosphatase: 97 U/L (ref 38–126)
Anion gap: 7 (ref 5–15)
BUN: 13 mg/dL (ref 6–20)
CO2: 21 mmol/L — ABNORMAL LOW (ref 22–32)
Calcium: 8 mg/dL — ABNORMAL LOW (ref 8.9–10.3)
Chloride: 114 mmol/L — ABNORMAL HIGH (ref 98–111)
Creatinine, Ser: 0.67 mg/dL (ref 0.61–1.24)
GFR, Estimated: >60 mL/min (ref >60)
Glucose, Bld: 116 mg/dL — ABNORMAL HIGH (ref 70–99)
Potassium: 3.2 mmol/L — ABNORMAL LOW (ref 3.5–5.1)
Sodium: 142 mmol/L (ref 135–145)
Total Bilirubin: 1.8 mg/dL — ABNORMAL HIGH (ref 0.3–1.2)
Total Protein: 6 g/dL — ABNORMAL LOW (ref 6.5–8.1)

## 2021-10-10 LAB — RESP PANEL BY RT-PCR (FLU A&B, COVID) ARPGX2
Influenza A by PCR: NEGATIVE
Influenza B by PCR: NEGATIVE
SARS Coronavirus 2 by RT PCR: NEGATIVE

## 2021-10-10 LAB — HIV ANTIBODY (ROUTINE TESTING W REFLEX): HIV Screen 4th Generation wRfx: NONREACTIVE

## 2021-10-10 LAB — LACTIC ACID, PLASMA: Lactic Acid, Venous: 2 mmol/L (ref 0.5–1.9)

## 2021-10-10 MED ORDER — POTASSIUM CHLORIDE CRYS ER 20 MEQ PO TBCR
40.0000 meq | EXTENDED_RELEASE_TABLET | Freq: Once | ORAL | Status: AC
  Administered 2021-10-10: 40 meq via ORAL
  Filled 2021-10-10: qty 2

## 2021-10-10 MED ORDER — TECHNETIUM TC 99M MEBROFENIN IV KIT
5.4000 | PACK | Freq: Once | INTRAVENOUS | Status: AC | PRN
  Administered 2021-10-10: 5.4 via INTRAVENOUS

## 2021-10-10 MED ORDER — ONDANSETRON HCL 4 MG PO TABS: 4.0000 mg | ORAL_TABLET | Freq: Four times a day (QID) | ORAL | Status: DC | PRN

## 2021-10-10 MED ORDER — SODIUM CHLORIDE 0.9 % IV BOLUS
500.0000 mL | Freq: Once | INTRAVENOUS | Status: AC
Start: 2021-10-10 — End: 2021-10-10
  Administered 2021-10-10: 500 mL via INTRAVENOUS

## 2021-10-10 MED ORDER — SODIUM CHLORIDE 0.9 % IV SOLN: INTRAVENOUS | Status: DC

## 2021-10-10 MED ORDER — HYDROMORPHONE HCL 1 MG/ML IJ SOLN
0.5000 mg | INTRAMUSCULAR | Status: DC | PRN
  Administered 2021-10-10: 0.5 mg via INTRAVENOUS
  Filled 2021-10-10 (×2): qty 0.5

## 2021-10-10 MED ORDER — ONDANSETRON HCL 4 MG/2ML IJ SOLN
4.0000 mg | Freq: Four times a day (QID) | INTRAMUSCULAR | Status: DC | PRN
  Administered 2021-10-10: 4 mg via INTRAVENOUS
  Filled 2021-10-10: qty 2

## 2021-10-10 MED ORDER — PIPERACILLIN-TAZOBACTAM 3.375 G IVPB
3.3750 g | Freq: Three times a day (TID) | INTRAVENOUS | Status: DC
  Administered 2021-10-10 – 2021-10-12 (×7): 3.375 g via INTRAVENOUS
  Filled 2021-10-10 (×8): qty 50

## 2021-10-10 MED ORDER — PANTOPRAZOLE SODIUM 40 MG IV SOLR
40.0000 mg | Freq: Two times a day (BID) | INTRAVENOUS | Status: DC
  Administered 2021-10-10 – 2021-10-11 (×5): 40 mg via INTRAVENOUS
  Filled 2021-10-10 (×5): qty 10

## 2021-10-10 MED ORDER — OXYCODONE HCL 5 MG PO TABS
5.0000 mg | ORAL_TABLET | ORAL | Status: DC | PRN
  Administered 2021-10-10 – 2021-10-11 (×4): 5 mg via ORAL
  Filled 2021-10-10 (×4): qty 1

## 2021-10-10 MED ORDER — POTASSIUM CHLORIDE 10 MEQ/100ML IV SOLN
10.0000 meq | INTRAVENOUS | Status: AC
  Administered 2021-10-10 (×3): 10 meq via INTRAVENOUS
  Filled 2021-10-10 (×3): qty 100

## 2021-10-10 NOTE — Assessment & Plan Note (Signed)
Patient has been seen by [facility] transplant.  His MELD score is improved.  He is not on a transplant list.  He continues to abstain from alcohol according to him.  Corroborated by his family.  Last drink was 2020.

## 2021-10-10 NOTE — Assessment & Plan Note (Addendum)
Assigned observation medical bed.  Right upper quadrant ultrasound requested due to gallbladder wall thickening.  There is no evidence of cholelithiasis.  If his right upper quadrant ultrasound shows continued gallbladder wall thickening without stones, will need to consider acalculus cholecystitis.  Continue empiric Zosyn due to the patient's fever.  Patient does not have any ascites.  He does have a history of SBP.  He is not taking any prophylactic antibiotics anymore.  **update. RUQ u/s negative. Will check HIDA scan in AM. Continue IV Zosyn.  Prn 0.5 mg IV dilaudid for pain  Abdominal pain is a Acute illness/condition that poses a threat to life or bodily function.

## 2021-10-10 NOTE — Assessment & Plan Note (Signed)
Unclear the source of his fever.  COVID and influenza test are negative.  Patient does have abdominal pain.  No ascites.  History of SBP in the past.  On empiric Zosyn 3.375 g IV every 8h.  Repeat CBC in the morning.

## 2021-10-10 NOTE — Progress Notes (Signed)
Transition of Care [facility]) Screening Note  Patient Details  Name: Jason [name] Date of Birth: 07-06-1980  Transition of Care [facility]) CM/SW Contact:    Ewing Schlein, LCSW Phone Number: 10/10/2021, 9:39 AM  Transition of Care Department [facility]) has reviewed patient and no TOC needs have been identified at this time. We will continue to monitor patient advancement through interdisciplinary progression rounds. If new patient transition needs arise, please place a TOC consult.

## 2021-10-10 NOTE — Assessment & Plan Note (Signed)
Likely due to his Lasix and Aldactone use.  Replete with IV potassium.  Repeat BMP in the morning.

## 2021-10-10 NOTE — Assessment & Plan Note (Signed)
Chronic.  Due to alcoholic cirrhosis.

## 2021-10-10 NOTE — Assessment & Plan Note (Signed)
Chronic. 

## 2021-10-10 NOTE — ED Notes (Signed)
Ice packs applied to groin and arm pits to help with fever per provider to aid in fever reduction per provider

## 2021-10-10 NOTE — Progress Notes (Signed)
Pharmacy Antibiotic Note  Jason [name] is a 41 y.o. male admitted on 10/09/2021 with intraabdominal infection? / fever of unknown origin. Pharmacy has been consulted for Zosyn dosing.  Does have a hx of SBP. Tmax 102.8, WBC 12.2.  Plan: Zosyn 3.375 gm IV q8h (4 hour infusion) Monitor clinical picture, renal function F/U HIDA scan, C&S, abx deescalation / LOT  Temp (24hrs), Avg:100.9 F (38.3 C), Min:100.3 F (37.9 C), Max:102.8 F (39.3 C)  Recent Labs  Lab 10/09/21 1551 10/09/21 1552  WBC 12.2*  --   CREATININE 0.58*  --   LATICACIDVEN  --  1.4    CrCl cannot be calculated (Unknown ideal weight.).    Allergies  Allergen Reactions   Acetaminophen Other (See Comments)    Cirrhosis of the liver    Antimicrobials this admission: CTX & Flagyl x 1 Zosyn 6/13 >>   Dose adjustments this admission:  Microbiology results: 6/12 BCx: sent COVID and flu negative  Thank you for allowing pharmacy to be a part of this patient's care.  Enzo Bi, PharmD, BCPS, BCIDP Clinical Pharmacist 10/10/2021 7:17 AM

## 2021-10-10 NOTE — Progress Notes (Signed)
PROGRESS NOTE    [address] [name]  JEH:[id number] DOB: 01-21-1981 DOA: 10/09/2021 PCP: Marcine Matar, MD   Brief Narrative: 41 year old with past medical history significant for alcoholic cirrhosis, history of SBP, history of ascites, chronic thrombocytopenia, obesity presented to the ED with 1 day history of nausea, vomiting abdominal pain fevers and chills.  He was recently seen by transplant clinic at [facility] on 09/18/2021.  He is not on the transplant list.  His MELD score has improved.  Synthetic function according to them has improved.  He is no longer having ascites.  He stopped drinking alcohol 2020.  He was found to have a temperature of 102 heart rate 120, leukocytosis, COVID test negative, UA unremarkable, lipase 35, CT scan; no acute finding of clear explanation for the patient's symptoms.  Ultrasound , small right hepatic cyst, patent main portal vein, no ascites.  HIDA scan no evidence of cholecystitis or bilaterally obstruction.  Mild bile reflux noted.   Assessment & Plan:   Principal Problem:   Abdominal pain Active Problems:   Fever   Thrombocytopenia (HCC)   Alcoholic cirrhosis (HCC)   Hypokalemia   Alcohol use disorder, severe, in sustained remission (HCC)  1-Abdominal pain:  -CT abdomen and pelvis:no acute finding of clear explanation for the patient's symptoms. -Ultrasound , small right hepatic cyst, patent main portal vein, no ascites.   -HIDA scan no evidence of cholecystitis or bilaterally obstruction.  Mild bile reflux noted. -Continue with IV Protonix -Denies diarrhea. Lipase normal  -unclear etiology  -mild transaminases, monitor.   2-Fever;  UA negative.  Chest x ray: no active diseases.  No ascites  on [place].  Follow Blood culture. Urine culture/  Continue with Zosyn.  Covid negative.  Repeat chest x ray tomorrow  3-Alcohol  use disorder, severe, in sustained remission  Alcoholic cirrhosis: He has been seen by [facility]  transplant.  His MELD score has improved.  He is not on the transplant list. Continue to abstain from alcohol.  Thrombocytopenia chronic due to alcoholic cirrhosis.  Monitor  Hypokalemia; replete orally.    Estimated body mass index is 39.77 kg/m as calculated from the following:   Height as of this encounter: 5\' 5"  (1.651 m).   Weight as of this encounter: 108.4 kg.   DVT prophylaxis: SCD Code Status: Full code Family Communication: care discussed with patient, and mother who was at bedside.  Disposition Plan:  Status is: Observation The patient will require care spanning > 2 midnights and should be moved to inpatient because: management of fever and abdominal pain     Consultants:  None  Procedures:  : Cirrhosis. 2. Small right hepatic cyst. 3. Patent main portal vein with hepatopetal flow. 4. No ascites. HIDA; No evidence of acute cholecystitis or biliary obstruction.   Mild bile reflux noted.    Antimicrobials:  IV zosyn   Subjective: He denies diarrhea, report mid abdomen pain. Report vomiting yesterday   Objective: Vitals:   10/10/21 0735 10/10/21 0826 10/10/21 0955 10/10/21 1216  BP:  (!) 143/74  131/66  Pulse:  81  69  Resp:  16  17  Temp: 99.4 F (37.4 C) 99 F (37.2 C)    TempSrc: Oral     SpO2:  100%  99%  Weight:   108.4 kg   Height:   5\' 5"  (1.651 m)     Intake/Output Summary (Last 24 hours) at 10/10/2021 1324 Last data filed at 10/10/2021 0112 Gross per 24 hour  Intake 236.05 ml  Output --  Net 236.05 ml   Filed Weights   10/10/21 0955  Weight: 108.4 kg    Examination:  General exam: Appears calm and comfortable  Respiratory system: Clear to auscultation. Respiratory effort normal. Cardiovascular system: S1 & S2 heard, RRR. No JVD, murmurs, rubs, gallops or clicks. No pedal edema. Gastrointestinal system: Abdomen is nondistended, soft  mild tenderness Central nervous system: Alert and oriented. No focal neurological  deficits. Extremities: Symmetric 5 x 5 power.   Data Reviewed: I have personally reviewed following labs and imaging studies  CBC: Recent Labs  Lab 10/09/21 1551 10/10/21 0911  WBC 12.2* 9.4  NEUTROABS  --  7.6  HGB 15.2 13.7  HCT 42.3 40.1  MCV 93.8 96.9  PLT 43* 34*   Basic Metabolic Panel: Recent Labs  Lab 10/09/21 1551 10/10/21 0911  NA 139 142  K 3.0* 3.2*  CL 111 114*  CO2 20* 21*  GLUCOSE 116* 116*  BUN 10 13  CREATININE 0.58* 0.67  CALCIUM 8.8* 8.0*   GFR: Estimated Creatinine Clearance: 142.2 mL/min (by C-G formula based on SCr of 0.67 mg/dL). Liver Function Tests: Recent Labs  Lab 10/09/21 1551 10/10/21 0911  AST 55* 60*  ALT 38 36  ALKPHOS 134* 97  BILITOT 2.2* 1.8*  PROT 6.5 6.0*  ALBUMIN 3.4* 3.1*   Recent Labs  Lab 10/09/21 1551  LIPASE 35   No results for input(s): "AMMONIA" in the last 168 hours. Coagulation Profile: No results for input(s): "INR", "PROTIME" in the last 168 hours. Cardiac Enzymes: No results for input(s): "CKTOTAL", "CKMB", "CKMBINDEX", "TROPONINI" in the last 168 hours. BNP (last 3 results) No results for input(s): "PROBNP" in the last 8760 hours. HbA1C: No results for input(s): "HGBA1C" in the last 72 hours. CBG: No results for input(s): "GLUCAP" in the last 168 hours. Lipid Profile: No results for input(s): "CHOL", "HDL", "LDLCALC", "TRIG", "CHOLHDL", "LDLDIRECT" in the last 72 hours. Thyroid Function Tests: No results for input(s): "TSH", "T4TOTAL", "FREET4", "T3FREE", "THYROIDAB" in the last 72 hours. Anemia Panel: No results for input(s): "VITAMINB12", "FOLATE", "FERRITIN", "TIBC", "IRON", "RETICCTPCT" in the last 72 hours. Sepsis Labs: Recent Labs  Lab 10/09/21 1552 10/10/21 0911 10/10/21 0957  PROCALCITON  --  1.64  --   LATICACIDVEN 1.4  --  2.0*    Recent Results (from the past 240 hour(s))  Blood culture (routine x 2)     Status: None (Preliminary result)   Collection Time: 10/09/21  3:51 PM    Specimen: BLOOD  Result Value Ref Range Status   Specimen Description BLOOD LEFT ANTECUBITAL  Final   Special Requests   Final    BOTTLES DRAWN AEROBIC AND ANAEROBIC Blood Culture adequate volume   Culture   Final    NO GROWTH < 24 HOURS Performed at [facility], [address] [place], [place] [address]    Report Status PENDING  Incomplete  Resp Panel by RT-PCR (Flu A&B, Covid) Anterior Nasal Swab     Status: None   Collection Time: 10/09/21 10:51 PM   Specimen: Anterior Nasal Swab  Result Value Ref Range Status   SARS Coronavirus 2 by RT PCR NEGATIVE NEGATIVE Final    Comment: (NOTE) SARS-CoV-2 target nucleic acids are NOT DETECTED.  The SARS-CoV-2 RNA is generally detectable in upper respiratory specimens during the acute phase of infection. The lowest concentration of SARS-CoV-2 viral copies this assay can detect is 138 copies/mL. A negative result does not preclude SARS-Cov-2  infection and should not be used as the sole basis for treatment or other patient management decisions. A negative result may occur with  improper specimen collection/handling, submission of specimen other than nasopharyngeal swab, presence of viral mutation(s) within the areas targeted by this assay, and inadequate number of viral copies(<138 copies/mL). A negative result must be combined with clinical observations, patient history, and epidemiological information. The expected result is Negative.  Fact Sheet for Patients:  BloggerCourse.comhttps://www.fda.gov/media/152166/download  Fact Sheet for Healthcare Providers:  SeriousBroker.ithttps://www.fda.gov/media/152162/download  This test is no t yet approved or cleared by the [place] FDA and  has been authorized for detection and/or diagnosis of SARS-CoV-2 by FDA under an Emergency Use Authorization (EUA). This EUA will remain  in effect (meaning this test can be used) for the duration of the COVID-19 declaration under Section 564(b)(1) of the Act, 21 U.S.C.section  360bbb-3(b)(1), unless the authorization is terminated  or revoked sooner.       Influenza A by PCR NEGATIVE NEGATIVE Final   Influenza B by PCR NEGATIVE NEGATIVE Final    Comment: (NOTE) The Xpert Xpress SARS-CoV-2/FLU/RSV plus assay is intended as an aid in the diagnosis of influenza from Nasopharyngeal swab specimens and should not be used as a sole basis for treatment. Nasal washings and aspirates are unacceptable for Xpert Xpress SARS-CoV-2/FLU/RSV testing.  Fact Sheet for Patients: BloggerCourse.comhttps://www.fda.gov/media/152166/download  Fact Sheet for Healthcare Providers: SeriousBroker.ithttps://www.fda.gov/media/152162/download  This test is not yet approved or cleared by the [place] FDA and has been authorized for detection and/or diagnosis of SARS-CoV-2 by FDA under an Emergency Use Authorization (EUA). This EUA will remain in effect (meaning this test can be used) for the duration of the COVID-19 declaration under Section 564(b)(1) of the Act, 21 U.S.C. section 360bbb-3(b)(1), unless the authorization is terminated or revoked.  Performed at [facility], [address] [place], [place] [id number]          Radiology Studies: NM Hepatobiliary Liver Func  Result Date: 10/10/2021 CLINICAL DATA:  Right upper quadrant abdominal pain, with nausea and vomiting. Fever and chills. Cirrhosis. EXAM: NUCLEAR MEDICINE HEPATOBILIARY IMAGING TECHNIQUE: Sequential images of the abdomen were obtained out to 60 minutes following intravenous administration of radiopharmaceutical. RADIOPHARMACEUTICALS:  5.4 mCi [id number]  Choletec IV COMPARISON:  None Available. FINDINGS: Prompt uptake and biliary excretion of activity by the liver is seen. Gallbladder activity is visualized, consistent with patency of cystic duct. Biliary activity passes into small bowel, consistent with patent common bile duct. A small amount of biliary activity refluxes into the stomach. IMPRESSION: No evidence of acute  cholecystitis or biliary obstruction. Mild bile reflux noted. Electronically Signed   By: Danae OrleansJohn A Stahl M.D.   On: 10/10/2021 12:11   US Abdomen Limited RUQ (LIVER/GB)  Result Date: 10/10/2021 CLINICAL DATA:  Right upper quadrant abdominal pain. EXAM: ULTRASOUND ABDOMEN LIMITED RIGHT UPPER QUADRANT COMPARISON:  Ultrasound dated 04/03/2021. FINDINGS: Gallbladder: No gallstones or wall thickening visualized. No sonographic Murphy sign noted by sonographer. Common bile duct: Diameter: 7 mm Liver: Cirrhosis. There is a 12 mm cyst in the right lobe of the liver. Portal vein is patent on color Doppler imaging with normal direction of blood flow towards the liver. Other: None. IMPRESSION: 1. Cirrhosis. 2. Small right hepatic cyst. 3. Patent main portal vein with hepatopetal flow. 4. No ascites. Electronically Signed   By: Elgie CollardArash  Radparvar M.D.   On: 10/10/2021 00:21   DG Chest Portable 1 View  Result Date: 10/09/2021 CLINICAL DATA:  Fever. EXAM:  PORTABLE CHEST 1 VIEW COMPARISON:  Chest x-ray 03/04/2018 FINDINGS: Patient is lordotic. The heart size and mediastinal contours are within normal limits. Both lungs are clear. The visualized skeletal structures are unremarkable. IMPRESSION: No active disease. Electronically Signed   By: Darliss Cheney M.D.   On: 10/09/2021 23:20   CT ABDOMEN PELVIS W CONTRAST  Result Date: 10/09/2021 CLINICAL DATA:  Abdominal pain, acute, nonlocalized. Fever. History cirrhosis and SBP. EXAM: CT ABDOMEN AND PELVIS WITH CONTRAST TECHNIQUE: Multidetector CT imaging of the abdomen and pelvis was performed using the standard protocol following bolus administration of intravenous contrast. RADIATION DOSE REDUCTION: This exam was performed according to the departmental dose-optimization program which includes automated exposure control, adjustment of the mA and/or kV according to patient size and/or use of iterative reconstruction technique. CONTRAST:  OMNIPAQUE IOHEXOL 300 MG/ML  SOLN  COMPARISON:  Abdominopelvic CT without contrast 09/30/2020 and ultrasound 04/03/2021. Abdominal MRI 12/08/2018. FINDINGS: Lower chest: Clear lung bases. No significant pleural or pericardial effusion. Hepatobiliary: Diffuse contour irregularity of the liver consistent with cirrhosis again noted. A 9 mm cyst in the right hepatic lobe on image 15/2 is unchanged. No suspicious liver lesions are identified, although the contrast bolus is suboptimal. Mild nonspecific gallbladder wall thickening. No evidence of gallstones or biliary dilatation. Pancreas: Unremarkable. No pancreatic ductal dilatation or surrounding inflammatory changes. Spleen: Mildly progressive splenomegaly. No focal abnormality identified. Adrenals/Urinary Tract: Both adrenal glands appear normal. Numerous nonobstructing bilateral renal calculi are again noted. No evidence of ureteral calculus, hydronephrosis or perinephric soft tissue stranding. The bladder remains incompletely distended, likely accounting for mild wall thickening which appears unchanged. No focal abnormality identified. Stomach/Bowel: No enteric contrast administered. The stomach appears unremarkable for its degree of distension. No evidence of bowel wall thickening, distention or surrounding inflammatory change. The appendix appears normal. Vascular/Lymphatic: There are no enlarged abdominal or pelvic lymph nodes. No acute vascular findings are identified. The portal vein remains distended, and there is a recanalized left paraumbilical vein with multiple dilated intra-abdominal venous collaterals. Reproductive: The prostate gland and seminal vesicles appear normal. Other: No ascites, free air or focal extraluminal fluid collection. Musculoskeletal: No acute osseous findings are identified. Stable findings of right greater than left developmental hip dysplasia with secondary degenerative changes. There is facet arthropathy in the lower lumbar spine. IMPRESSION: 1. No acute findings or  clear explanation for the patient's symptoms. 2. 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 3. Numerous nonobstructing bilateral renal calculi. No evidence of ureteral calculus or hydronephrosis. 4. Stable chronic osseous findings of bilateral hip dysplasia and lumbar facet arthropathy. Electronically Signed   By: Carey Bullocks M.D.   On: 10/09/2021 17:10        Scheduled Meds:  pantoprazole (PROTONIX) IV  40 mg Intravenous Q12H   Continuous Infusions:  sodium chloride 50 mL/hr at 10/10/21 0855   piperacillin-tazobactam (ZOSYN)  IV 3.375 g (10/10/21 0954)     LOS: 0 days    Time spent: 35 minutes.     Jason Cory, MD [facility]   If 7PM-7AM, please contact night-coverage [email or web address]  10/10/2021, 1:24 PM

## 2021-10-10 NOTE — ED Notes (Signed)
Recheck temp at 100.4 after ice packs

## 2021-10-10 NOTE — Plan of Care (Signed)

## 2021-10-10 NOTE — H&P (Addendum)
. History and Physical    [address] Damiano [name] LNL:[id number] DOB: 08/19/1980 DOA: 10/09/2021  DOS: the patient was seen and examined on 10/09/2021  PCP: Ladell Pier, MD   Patient coming from: Home  I have personally briefly reviewed patient's old medical records in [facility]  Chief complaint: Fever, chills, headache, abdominal pain, nausea, vomiting x12 hours. History of present illness: Video Spanish interpreter used [id number]  41 year old Hispanic male history of alcoholic cirrhosis, history of SBP, history of ascites, chronic thrombocytopenia, obesity, presents to the ER today with a 1 day history of nausea, vomiting, abdominal pain, fever, chills.  Patient was feeling normal yesterday.  No ill contacts.  Worsening abdominal pain today.  Came to the ER for evaluation.  Recently seen by his transplant clinic at [facility] on 09/18/2021.  He is not on the transplant list.  His MELD score is improved.  Synthetic function according to them has improved.  He is no longer having ascites.  He stopped drinking 2020 after his bout of SBP and septicemia.  Patient is not been having any diarrhea.  On arrival temp 100.3 which increased to 102.8.  Heart rate 121 blood pressure 140/84 satting 94% on room air.  Labs: White count 12.2, hemoglobin 15.2, platelets 43,000  COVID test negative, influenza test negative.  UA unremarkable.  Sodium 139, potassium 3.0, bicarb 20, BUN 10, creatinine 0.58  AST 55, ALT 38, alk phos 134, total bili 2.2  Lipase 35  Lactic acid 1.4  CT scan which I personally reviewed and interpreted shows gallbladder wall thickening without Coley lithiasis.  Chest x-ray which I personally viewed interpreted shows no acute disease.   ED Course: Febrile in the ER.  Started on IV Zosyn.  Review of Systems:  Review of Systems  Constitutional:  Positive for chills, fever and malaise/fatigue.  HENT: Negative.    Eyes: Negative.    Respiratory: Negative.    Cardiovascular: Negative.   Gastrointestinal:  Positive for abdominal pain, nausea and vomiting.  Genitourinary: Negative.  Negative for dysuria and urgency.  Musculoskeletal:  Positive for myalgias.  Skin:  Negative for rash.  Neurological:  Positive for headaches.  Endo/Heme/Allergies: Negative.   Psychiatric/Behavioral: Negative.    All other systems reviewed and are negative.   Past Medical History:  Diagnosis Date   Alcoholic cirrhosis of liver ([place])    Alcoholic cirrhosis of liver with ascites ([place]) 06/29/2017   History of heavy, daily drinking. No alcohol since 05/2017. Started treatment 05/2017 with lactulose, thiamine, MVI Spironolactone + furosemide initiated 06/2017.   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 He is currently on lactulose for questionable history of [place]t encephalopathy.  No evidence of encephalopathy at today's visit.  Reviewed signs/symptoms of encephalopathy and indication to contact our office.   GERD (gastroesophageal reflux disease)    Hypertension    Nocturia    SBP (spontaneous bacterial peritonitis) ([place]) 05/01/2018   Thrombocytopenia [facility])     Past Surgical History:  Procedure Laterality Date   BIOPSY  08/10/2019   Procedure: BIOPSY;  Surgeon: Lavena Bullion, DO;  Location: [facility];  Service: Gastroenterology;;   BIOPSY  02/08/2020   Procedure: BIOPSY;  Surgeon: Lavena Bullion, DO;  Location: [facility];  Service: Gastroenterology;;  BIOPSY  09/11/2021   Procedure: BIOPSY;  Surgeon: Shellia Cleverly, DO;  Location: [facility];  Service: Gastroenterology;;   ESOPHAGEAL BANDING  08/10/2019   Procedure: ESOPHAGEAL BANDING;  Surgeon: Shellia Cleverly, DO;  Location: [facility];  Service: Gastroenterology;;    ESOPHAGOGASTRODUODENOSCOPY (EGD) WITH PROPOFOL N/A 08/10/2019   Procedure: ESOPHAGOGASTRODUODENOSCOPY (EGD) WITH PROPOFOL;  Surgeon: Shellia Cleverly, DO;  Location: [facility];  Service: Gastroenterology;  Laterality: N/A;   ESOPHAGOGASTRODUODENOSCOPY (EGD) WITH PROPOFOL N/A 02/08/2020   Procedure: ESOPHAGOGASTRODUODENOSCOPY (EGD) WITH PROPOFOL;  Surgeon: Shellia Cleverly, DO;  Location: [facility];  Service: Gastroenterology;  Laterality: N/A;   ESOPHAGOGASTRODUODENOSCOPY (EGD) WITH PROPOFOL N/A 09/11/2021   Procedure: ESOPHAGOGASTRODUODENOSCOPY (EGD) WITH PROPOFOL;  Surgeon: Shellia Cleverly, DO;  Location: [facility];  Service: Gastroenterology;  Laterality: N/A;   FOOT SURGERY Left    around 2014. a scar on lower leg   ORIF ANKLE FRACTURE Right 06/09/2019   Procedure: RIGHT OPEN REDUCTION INTERNAL FIXATION (ORIF) ANKLE FRACTURE;  Surgeon: Sheral Apley, MD;  Location: [facility];  Service: Orthopedics;  Laterality: Right;     reports that he has quit smoking. He has never used smokeless tobacco. He reports that he does not currently use alcohol. He reports that he does not use drugs.  No Known Allergies  Family History  Problem Relation Age of Onset   Colon cancer Neg Hx    Esophageal cancer Neg Hx    Healthy Mother    Healthy Daughter     Prior to Admission medications   Medication Sig Start Date End Date Taking? Authorizing Provider  bismuth subsalicylate (PEPTO BISMOL) 262 MG chewable tablet Chew 524 mg by mouth as needed for indigestion or diarrhea or loose stools.    [provider]  famotidine (PEPCID) 20 MG tablet Take 1 tablet (20 mg total) by mouth daily. 07/10/21 07/10/22  Cirigliano, Vito V, DO  folic acid (FOLVITE) 1 MG tablet Take 1 tablet (1 mg total) by mouth daily. 07/11/21   Marcine Matar, MD  furosemide (LASIX) 40 MG tablet Take 1 tablet (40 mg total) by mouth daily. 07/11/21   Marcine Matar, MD  ibuprofen (ADVIL) 200 MG  tablet Take 200 mg by mouth every 6 (six) hours as needed for mild pain or moderate pain.    [provider]  lactulose (CHRONULAC) 10 GM/15ML solution TAKE 30 MLS BY MOUTH 2 TIMES DAILY 02/27/21   Marcine Matar, MD  Multiple Vitamin (MULTIVITAMIN WITH MINERALS) TABS tablet Take 1 tablet by mouth daily. 05/10/18   Shon Hale, MD  potassium chloride SA (KLOR-CON M) 20 MEQ tablet TAKE 1 TABLET (20 MEQ TOTAL) BY MOUTH 2 (TWO) TIMES DAILY. 09/13/21 09/13/22  Marcine Matar, MD  spironolactone (ALDACTONE) 25 MG tablet Take 1 tablet (25 mg total) by mouth daily. 07/11/21   Marcine Matar, MD  thiamine 100 MG tablet Take 1 tablet (100 mg total) by mouth daily. 06/10/18   Marcine Matar, MD    Physical Exam: Vitals:   10/09/21 2242 10/09/21 2245 10/09/21 2330 10/09/21 2345  BP: 116/78 (!) 162/80 130/73 139/72  Pulse:  (!) 103 99 100  Resp:  (!) 30    Temp:  (!) 102.8 F (39.3 C)    TempSrc:  Oral    SpO2:  100% 95% 96%    Physical Exam Vitals and nursing note reviewed.  Constitutional:      General: He is not in acute distress.  Appearance: Normal appearance. He is obese. He is not ill-appearing, toxic-appearing or diaphoretic.  HENT:     Head: Normocephalic and atraumatic.     Nose: Nose normal. No rhinorrhea.  Cardiovascular:     Rate and Rhythm: Regular rhythm. Tachycardia present.     Pulses: Normal pulses.  Pulmonary:     Effort: Pulmonary effort is normal. No respiratory distress.     Breath sounds: Normal breath sounds. No wheezing or rales.  Abdominal:     Tenderness: There is generalized abdominal tenderness. There is no guarding or rebound.  Genitourinary:    Testes:        Right: Swelling not present.        Left: Swelling not present.  Musculoskeletal:     Right lower leg: No edema.     Left lower leg: No edema.  Skin:    General: Skin is warm and dry.     Capillary Refill: Capillary refill takes less than 2 seconds.  Neurological:      General: No focal deficit present.     Mental Status: He is alert and oriented to person, place, and time.      Labs on Admission: I have personally reviewed following labs and imaging studies  CBC: Recent Labs  Lab 10/09/21 1551  WBC 12.2*  HGB 15.2  HCT 42.3  MCV 93.8  PLT 43*   Basic Metabolic Panel: Recent Labs  Lab 10/09/21 1551  NA 139  K 3.0*  CL 111  CO2 20*  GLUCOSE 116*  BUN 10  CREATININE 0.58*  CALCIUM 8.8*   GFR: CrCl cannot be calculated (Unknown ideal weight.). Liver Function Tests: Recent Labs  Lab 10/09/21 1551  AST 55*  ALT 38  ALKPHOS 134*  BILITOT 2.2*  PROT 6.5  ALBUMIN 3.4*   Recent Labs  Lab 10/09/21 1551  LIPASE 35   No results for input(s): "AMMONIA" in the last 168 hours. Coagulation Profile: No results for input(s): "INR", "PROTIME" in the last 168 hours. Cardiac Enzymes: No results for input(s): "CKTOTAL", "CKMB", "CKMBINDEX", "TROPONINI", "TROPONINIHS" in the last 168 hours. BNP (last 3 results) No results for input(s): "PROBNP" in the last 8760 hours. HbA1C: No results for input(s): "HGBA1C" in the last 72 hours. CBG: No results for input(s): "GLUCAP" in the last 168 hours. Lipid Profile: No results for input(s): "CHOL", "HDL", "LDLCALC", "TRIG", "CHOLHDL", "LDLDIRECT" in the last 72 hours. Thyroid Function Tests: No results for input(s): "TSH", "T4TOTAL", "FREET4", "T3FREE", "THYROIDAB" in the last 72 hours. Anemia Panel: No results for input(s): "VITAMINB12", "FOLATE", "FERRITIN", "TIBC", "IRON", "RETICCTPCT" in the last 72 hours. Urine analysis:    Component Value Date/Time   COLORURINE YELLOW 10/09/2021 [place] 10/09/2021 1634   APPEARANCEUR Cloudy (A) 09/19/2020 1625   LABSPEC 1.008 10/09/2021 1634   PHURINE 7.0 10/09/2021 1634   GLUCOSEU NEGATIVE 10/09/2021 1634   HGBUR SMALL (A) 10/09/2021 1634   BILIRUBINUR NEGATIVE 10/09/2021 1634   BILIRUBINUR negative 02/09/2021 1054   BILIRUBINUR  Negative 09/19/2020 1625   KETONESUR NEGATIVE 10/09/2021 1634   PROTEINUR NEGATIVE 10/09/2021 1634   UROBILINOGEN 1.0 02/09/2021 1054   NITRITE NEGATIVE 10/09/2021 1634   LEUKOCYTESUR NEGATIVE 10/09/2021 1634    Radiological Exams on Admission: I have personally reviewed images US Abdomen Limited RUQ (LIVER/GB)  Result Date: 10/10/2021 CLINICAL DATA:  Right upper quadrant abdominal pain. EXAM: ULTRASOUND ABDOMEN LIMITED RIGHT UPPER QUADRANT COMPARISON:  Ultrasound dated 04/03/2021. FINDINGS: Gallbladder: No gallstones or wall thickening visualized. No sonographic  Murphy sign noted by sonographer. Common bile duct: Diameter: 7 mm Liver: Cirrhosis. There is a 12 mm cyst in the right lobe of the liver. Portal vein is patent on color Doppler imaging with normal direction of blood flow towards the liver. Other: None. IMPRESSION: 1. Cirrhosis. 2. Small right hepatic cyst. 3. Patent main portal vein with hepatopetal flow. 4. No ascites. Electronically Signed   By: Elgie Collard M.D.   On: 10/10/2021 00:21   DG Chest Portable 1 View  Result Date: 10/09/2021 CLINICAL DATA:  Fever. EXAM: PORTABLE CHEST 1 VIEW COMPARISON:  Chest x-ray 03/04/2018 FINDINGS: Patient is lordotic. The heart size and mediastinal contours are within normal limits. Both lungs are clear. The visualized skeletal structures are unremarkable. IMPRESSION: No active disease. Electronically Signed   By: Darliss Cheney M.D.   On: 10/09/2021 23:20   CT ABDOMEN PELVIS W CONTRAST  Result Date: 10/09/2021 CLINICAL DATA:  Abdominal pain, acute, nonlocalized. Fever. History cirrhosis and SBP. EXAM: CT ABDOMEN AND PELVIS WITH CONTRAST TECHNIQUE: Multidetector CT imaging of the abdomen and pelvis was performed using the standard protocol following bolus administration of intravenous contrast. RADIATION DOSE REDUCTION: This exam was performed according to the departmental dose-optimization program which includes automated exposure control,  adjustment of the mA and/or kV according to patient size and/or use of iterative reconstruction technique. CONTRAST:  OMNIPAQUE IOHEXOL 300 MG/ML  SOLN COMPARISON:  Abdominopelvic CT without contrast 09/30/2020 and ultrasound 04/03/2021. Abdominal MRI 12/08/2018. FINDINGS: Lower chest: Clear lung bases. No significant pleural or pericardial effusion. Hepatobiliary: Diffuse contour irregularity of the liver consistent with cirrhosis again noted. A 9 mm cyst in the right hepatic lobe on image 15/2 is unchanged. No suspicious liver lesions are identified, although the contrast bolus is suboptimal. Mild nonspecific gallbladder wall thickening. No evidence of gallstones or biliary dilatation. Pancreas: Unremarkable. No pancreatic ductal dilatation or surrounding inflammatory changes. Spleen: Mildly progressive splenomegaly. No focal abnormality identified. Adrenals/Urinary Tract: Both adrenal glands appear normal. Numerous nonobstructing bilateral renal calculi are again noted. No evidence of ureteral calculus, hydronephrosis or perinephric soft tissue stranding. The bladder remains incompletely distended, likely accounting for mild wall thickening which appears unchanged. No focal abnormality identified. Stomach/Bowel: No enteric contrast administered. The stomach appears unremarkable for its degree of distension. No evidence of bowel wall thickening, distention or surrounding inflammatory change. The appendix appears normal. Vascular/Lymphatic: There are no enlarged abdominal or pelvic lymph nodes. No acute vascular findings are identified. The portal vein remains distended, and there is a recanalized left paraumbilical vein with multiple dilated intra-abdominal venous collaterals. Reproductive: The prostate gland and seminal vesicles appear normal. Other: No ascites, free air or focal extraluminal fluid collection. Musculoskeletal: No acute osseous findings are identified. 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 There is facet arthropathy in the lower lumbar spine. IMPRESSION: 1. No acute findings or clear explanation for the patient's symptoms. 2. 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 3. Numerous nonobstructing bilateral renal calculi. No evidence of ureteral calculus or hydronephrosis. 4. Stable chronic osseous findings of bilateral hip dysplasia and lumbar facet arthropathy. Electronically Signed   By: Carey Bullocks M.D.   On: 10/09/2021 17:10    EKG: My personal interpretation of EKG shows: no ekg    Assessment/Plan Principal Problem:   Abdominal pain Active Problems:   Fever   Thrombocytopenia (HCC)   Alcoholic cirrhosis (HCC)  Hypokalemia   Alcohol use disorder, severe, in sustained remission (HCC)    Assessment and Plan: * Abdominal pain Assigned observation medical bed.  Right upper quadrant ultrasound requested due to gallbladder wall thickening.  There is no evidence of cholelithiasis.  If his right upper quadrant ultrasound shows continued gallbladder wall thickening without stones, will need to consider acalculus cholecystitis.  Continue empiric Zosyn due to the patient's fever.  Patient does not have any ascites.  He does have a history of SBP.  He is not taking any prophylactic antibiotics anymore.  **update. RUQ u/s negative. Will check HIDA scan in AM. Continue IV Zosyn.  Prn 0.5 mg IV dilaudid for pain  Abdominal pain is a Acute illness/condition that poses a threat to life or bodily function.   Fever Unclear the source of his fever.  COVID and influenza test are negative.  Patient does have abdominal pain.  No ascites.  History of SBP in the past.  On empiric Zosyn 3.375 g IV every 8h.  Repeat CBC in the morning.  Alcohol use disorder, severe, in sustained remission  (HCC) Chronic.  Hypokalemia Likely due to his Lasix and Aldactone use.  Replete with IV potassium.  Repeat BMP in the morning.  Alcoholic cirrhosis [facility]) Patient has been seen by [facility] transplant.  His MELD score is improved.  He is not on a transplant list.  He continues to abstain from alcohol according to him.  Corroborated by his family.  Last drink was 2020.  Thrombocytopenia (HCC) Chronic.  Due to alcoholic cirrhosis.   DVT prophylaxis: SCDs Code Status: Full Code Family Communication: discussed with pt and family at bedside with use of video interpretor  Disposition Plan: return home  Consults called: none  Admission status: Observation, Med-Surg   Kristopher Oppenheim, DO [facility] 10/10/2021, 12:38 AM

## 2021-10-10 NOTE — Subjective & Objective (Signed)
Chief complaint: Fever, chills, headache, abdominal pain, nausea, vomiting x12 hours. History of present illness: Video Spanish interpreter used #[id number]  41 year old Hispanic male history of alcoholic cirrhosis, history of SBP, history of ascites, chronic thrombocytopenia, obesity, presents to the ER today with a 1 day history of nausea, vomiting, abdominal pain, fever, chills.  Patient was feeling normal yesterday.  No ill contacts.  Worsening abdominal pain today.  Came to the ER for evaluation.  Recently seen by his transplant clinic at [facility] on 09/18/2021.  He is not on the transplant list.  His MELD score is improved.  Synthetic function according to them has improved.  He is no longer having ascites.  He stopped drinking 2020 after his bout of SBP and septicemia.  Patient is not been having any diarrhea.  On arrival temp 100.3 which increased to 102.8.  Heart rate 121 blood pressure 140/84 satting 94% on room air.  Labs: White count 12.2, hemoglobin 15.2, platelets 43,000  COVID test negative, influenza test negative.  UA unremarkable.  Sodium 139, potassium 3.0, bicarb 20, BUN 10, creatinine 0.58  AST 55, ALT 38, alk phos 134, total bili 2.2  Lipase 35  Lactic acid 1.4  CT scan which I personally reviewed and interpreted shows gallbladder wall thickening without Coley lithiasis.  Chest x-ray which I personally viewed interpreted shows no acute disease.

## 2021-10-10 NOTE — Plan of Care (Signed)
Problem: Education: Goal: Knowledge of General Education information will improve Description: Including pain rating scale, medication(s)/side effects and non-pharmacologic comfort measures Outcome: Progressing   Problem: Clinical Measurements: Goal: Ability to maintain clinical measurements within normal limits will improve Outcome: Progressing   Problem: Pain Managment: Goal: General experience of comfort will improve Outcome: Progressing   Haydee Salter, RN 10/10/21 3:40 PM

## 2021-10-11 ENCOUNTER — Inpatient Hospital Stay (HOSPITAL_COMMUNITY): Payer: 59

## 2021-10-11 DIAGNOSIS — R1013 Epigastric pain: Secondary | ICD-10-CM | POA: Diagnosis not present

## 2021-10-11 DIAGNOSIS — K766 Portal hypertension: Secondary | ICD-10-CM

## 2021-10-11 LAB — URINE CULTURE: Culture: NO GROWTH

## 2021-10-11 LAB — BASIC METABOLIC PANEL
Anion gap: 4 — ABNORMAL LOW (ref 5–15)
BUN: 10 mg/dL (ref 6–20)
CO2: 21 mmol/L — ABNORMAL LOW (ref 22–32)
Calcium: 7.7 mg/dL — ABNORMAL LOW (ref 8.9–10.3)
Chloride: 111 mmol/L (ref 98–111)
Creatinine, Ser: 0.6 mg/dL — ABNORMAL LOW (ref 0.61–1.24)
GFR, Estimated: >60 mL/min (ref >60)
Glucose, Bld: 97 mg/dL (ref 70–99)
Potassium: 3.6 mmol/L (ref 3.5–5.1)
Sodium: 136 mmol/L (ref 135–145)

## 2021-10-11 LAB — CBC WITH DIFFERENTIAL/PLATELET
Abs Immature Granulocytes: 0.03 10*3/uL (ref 0.00–0.07)
Basophils Absolute: 0 10*3/uL (ref 0.0–0.1)
Basophils Relative: 0 %
Eosinophils Absolute: 0.1 10*3/uL (ref 0.0–0.5)
Eosinophils Relative: 1 %
HCT: 38.6 % — ABNORMAL LOW (ref 39.0–52.0)
Hemoglobin: 13.4 g/dL (ref 13.0–17.0)
Immature Granulocytes: 0 %
Lymphocytes Relative: 21 %
Lymphs Abs: 1.5 10*3/uL (ref 0.7–4.0)
MCH: 33.3 pg (ref 26.0–34.0)
MCHC: 34.7 g/dL (ref 30.0–36.0)
MCV: 95.8 fL (ref 80.0–100.0)
Monocytes Absolute: 1.1 10*3/uL — ABNORMAL HIGH (ref 0.1–1.0)
Monocytes Relative: 16 %
Neutro Abs: 4.2 10*3/uL (ref 1.7–7.7)
Neutrophils Relative %: 62 %
Platelets: 32 10*3/uL — ABNORMAL LOW (ref 150–400)
RBC: 4.03 MIL/uL — ABNORMAL LOW (ref 4.22–5.81)
RDW: 14.4 % (ref 11.5–15.5)
WBC: 6.9 10*3/uL (ref 4.0–10.5)
nRBC: 0 % (ref 0.0–0.2)

## 2021-10-11 LAB — HEPATIC FUNCTION PANEL
ALT: 34 U/L (ref 0–44)
AST: 56 U/L — ABNORMAL HIGH (ref 15–41)
Albumin: 2.8 g/dL — ABNORMAL LOW (ref 3.5–5.0)
Alkaline Phosphatase: 86 U/L (ref 38–126)
Bilirubin, Direct: 0.6 mg/dL — ABNORMAL HIGH (ref 0.0–0.2)
Indirect Bilirubin: 1.4 mg/dL — ABNORMAL HIGH (ref 0.3–0.9)
Total Bilirubin: 2 mg/dL — ABNORMAL HIGH (ref 0.3–1.2)
Total Protein: 5.6 g/dL — ABNORMAL LOW (ref 6.5–8.1)

## 2021-10-11 LAB — PROTIME-INR
INR: 1.3 — ABNORMAL HIGH (ref 0.8–1.2)
Prothrombin Time: 15.8 seconds — ABNORMAL HIGH (ref 11.4–15.2)

## 2021-10-11 LAB — PROCALCITONIN: Procalcitonin: 1.2 ng/mL

## 2021-10-11 MED ORDER — TRIAMCINOLONE 0.1 % CREAM:EUCERIN CREAM 1:1
TOPICAL_CREAM | Freq: Two times a day (BID) | CUTANEOUS | Status: DC
  Filled 2021-10-11: qty 1

## 2021-10-11 NOTE — Progress Notes (Signed)
PROGRESS NOTE    [address] Blaydon Brenna  [id number] DOB: 1980/11/27 DOA: 10/09/2021 PCP: Ladell Pier, MD   Brief Narrative: 41 year old with past medical history significant for alcoholic cirrhosis, history of SBP, history of ascites, chronic thrombocytopenia, obesity presented to the ED with 1 day history of nausea, vomiting abdominal pain fevers and chills.  He was recently seen by transplant clinic at [facility] on 09/18/2021.  He is not on the transplant list.  His MELD score has improved.  Synthetic function according to them has improved.  He is no longer having ascites.  He stopped drinking alcohol 2020.  He was found to have a temperature of 102 heart rate 120, leukocytosis, COVID test negative, UA unremarkable, lipase 35, CT scan; no acute finding of clear explanation for the patient's symptoms.  Ultrasound , small right hepatic cyst, patent main portal vein, no ascites.  HIDA scan no evidence of cholecystitis or bilaterally obstruction.  Mild bile reflux noted.   Assessment & Plan:   Principal Problem:   Abdominal pain Active Problems:   Fever   Thrombocytopenia (HCC)   Alcoholic cirrhosis (HCC)   Hypokalemia   Alcohol use disorder, severe, in sustained remission (HCC)  1-Abdominal pain:  -CT abdomen and pelvis:no acute finding of clear explanation for the patient's symptoms. -Ultrasound , small right hepatic cyst, patent main portal vein, no ascites.   -HIDA scan no evidence of cholecystitis or bilaterally obstruction.  Mild bile reflux noted. -Continue with IV Protonix -Denies diarrhea. Lipase normal  -unclear etiology  -mild transaminases, monitor.  GI consulted.   2-Fever;  UA negative.  Chest x ray: no active diseases.  No ascites  on [place].  Blood culture: No growth to date. . Urine culture/ No growth  Continue with Zosyn.  Covid negative.  Repeated chest x ray negative for PNA.  He has remain afebrile.  Pro-calcitonin elevated, continue  with IV antibiotics.  He has rash left LE. Will cover for cellulitis.   3-Alcohol  use disorder, severe, in sustained remission  Alcoholic cirrhosis: He has been seen by [facility] transplant.  His MELD score has improved.  He is not on the transplant list. Continue to abstain from alcohol.  Thrombocytopenia chronic due to alcoholic cirrhosis.  Monitor  Hypokalemia; Replaced.  Headache: plan to check CT head due to headache and thrombocytopenia.   Estimated body mass index is 43.84 kg/m as calculated from the following:   Height as of this encounter: 5\' 5"  (1.651 m).   Weight as of this encounter: 119.5 kg.   DVT prophylaxis: SCD Code Status: Full code Family Communication: care discussed with patient, and mother who was at bedside.  Disposition Plan:  Status is: Observation The patient will require care spanning > 2 midnights and should be moved to inpatient because: management of fever and abdominal pain     Consultants:  None  Procedures:  [place]: Cirrhosis. 2. Small right hepatic cyst. 3. Patent main portal vein with hepatopetal flow. 4. No ascites. HIDA; No evidence of acute cholecystitis or biliary obstruction.   Mild bile reflux noted.    Antimicrobials:  IV zosyn   Subjective: He is feeling better. Abdominal pain improving. Mild headaches.   Objective: Vitals:   10/10/21 2133 10/11/21 0153 10/11/21 0545 10/11/21 1523  BP: 125/67 (!) 108/51 (!) 106/56 121/72  Pulse: 67 62 61 67  Resp: 17 17 17 15   Temp: 98.9 F (37.2 C) 98.6 F (37 C) 98.8 F (37.1 C) 99.3 F (37.4  C)  TempSrc: Oral Oral Oral Oral  SpO2: 99% 99% 99% 99%  Weight:   119.5 kg   Height:        Intake/Output Summary (Last 24 hours) at 10/11/2021 1656 Last data filed at 10/11/2021 1525 Gross per 24 hour  Intake 1335.01 ml  Output --  Net 1335.01 ml    Filed Weights   10/10/21 0955 10/11/21 0545  Weight: 108.4 kg 119.5 kg    Examination:  General exam: Appears calm and  comfortable  Respiratory system: Clear to auscultation. Respiratory effort normal. Cardiovascular system: S1 & S2 heard, RRR. No JVD, murmurs, rubs, gallops or clicks. No pedal edema. Gastrointestinal system: Abdomen is nondistended, soft  mild tenderness Central nervous system: Alert and oriented. No focal neurological deficits. Extremities: Symmetric 5 x 5 power.   Data Reviewed: I have personally reviewed following labs and imaging studies  CBC: Recent Labs  Lab 10/09/21 1551 10/10/21 0911 10/11/21 0353  WBC 12.2* 9.4 6.9  NEUTROABS  --  7.6 4.2  HGB 15.2 13.7 13.4  HCT 42.3 40.1 38.6*  MCV 93.8 96.9 95.8  PLT 43* 34* 32*    Basic Metabolic Panel: Recent Labs  Lab 10/09/21 1551 10/10/21 0911 10/11/21 0353  NA 139 142 136  K 3.0* 3.2* 3.6  CL 111 114* 111  CO2 20* 21* 21*  GLUCOSE 116* 116* 97  BUN 10 13 10   CREATININE 0.58* 0.67 0.60*  CALCIUM 8.8* 8.0* 7.7*    GFR: Estimated Creatinine Clearance: 150 mL/min (A) (by C-G formula based on SCr of 0.6 mg/dL (L)). Liver Function Tests: Recent Labs  Lab 10/09/21 1551 10/10/21 0911 10/11/21 0353  AST 55* 60* 56*  ALT 38 36 34  ALKPHOS 134* 97 86  BILITOT 2.2* 1.8* 2.0*  PROT 6.5 6.0* 5.6*  ALBUMIN 3.4* 3.1* 2.8*    Recent Labs  Lab 10/09/21 1551  LIPASE 35    No results for input(s): "AMMONIA" in the last 168 hours. Coagulation Profile: Recent Labs  Lab 10/11/21 1301  INR 1.3*   Cardiac Enzymes: No results for input(s): "CKTOTAL", "CKMB", "CKMBINDEX", "TROPONINI" in the last 168 hours. BNP (last 3 results) No results for input(s): "PROBNP" in the last 8760 hours. HbA1C: No results for input(s): "HGBA1C" in the last 72 hours. CBG: No results for input(s): "GLUCAP" in the last 168 hours. Lipid Profile: No results for input(s): "CHOL", "HDL", "LDLCALC", "TRIG", "CHOLHDL", "LDLDIRECT" in the last 72 hours. Thyroid Function Tests: No results for input(s): "TSH", "T4TOTAL", "FREET4", "T3FREE",  "THYROIDAB" in the last 72 hours. Anemia Panel: No results for input(s): "VITAMINB12", "FOLATE", "FERRITIN", "TIBC", "IRON", "RETICCTPCT" in the last 72 hours. Sepsis Labs: Recent Labs  Lab 10/09/21 1552 10/10/21 0911 10/10/21 0957 10/11/21 0353  PROCALCITON  --  1.64  --  1.20  LATICACIDVEN 1.4  --  2.0*  --      Recent Results (from the past 240 hour(s))  Blood culture (routine x 2)     Status: None (Preliminary result)   Collection Time: 10/09/21  3:51 PM   Specimen: BLOOD  Result Value Ref Range Status   Specimen Description BLOOD LEFT ANTECUBITAL  Final   Special Requests   Final    BOTTLES DRAWN AEROBIC AND ANAEROBIC Blood Culture adequate volume   Culture   Final    NO GROWTH 2 DAYS Performed at [facility], [place] [address] [place], [place] [address]    Report Status PENDING  Incomplete  Resp Panel by RT-PCR (Flu  A&B, Covid) Anterior Nasal Swab     Status: None   Collection Time: 10/09/21 10:51 PM   Specimen: Anterior Nasal Swab  Result Value Ref Range Status   SARS Coronavirus 2 by RT PCR NEGATIVE NEGATIVE Final    Comment: (NOTE) SARS-CoV-2 target nucleic acids are NOT DETECTED.  The SARS-CoV-2 RNA is generally detectable in upper respiratory specimens during the acute phase of infection. The lowest concentration of SARS-CoV-2 viral copies this assay can detect is 138 copies/mL. A negative result does not preclude SARS-Cov-2 infection and should not be used as the sole basis for treatment or other patient management decisions. A negative result may occur with  improper specimen collection/handling, submission of specimen other than nasopharyngeal swab, presence of viral mutation(s) within the areas targeted by this assay, and inadequate number of viral copies(<138 copies/mL). A negative result must be combined with clinical observations, patient history, and epidemiological information. The expected result is Negative.  Fact Sheet for Patients:   EntrepreneurPulse.com.au  Fact Sheet for Healthcare Providers:  IncredibleEmployment.be  This test is no t yet approved or cleared by the [place] FDA and  has been authorized for detection and/or diagnosis of SARS-CoV-2 by FDA under an Emergency Use Authorization (EUA). This EUA will remain  in effect (meaning this test can be used) for the duration of the COVID-19 declaration under Section 564(b)(1) of the Act, 21 U.S.C.section 360bbb-3(b)(1), unless the authorization is terminated  or revoked sooner.       Influenza A by PCR NEGATIVE NEGATIVE Final   Influenza B by PCR NEGATIVE NEGATIVE Final    Comment: (NOTE) The Xpert Xpress SARS-CoV-2/FLU/RSV plus assay is intended as an aid in the diagnosis of influenza from Nasopharyngeal swab specimens and should not be used as a sole basis for treatment. Nasal washings and aspirates are unacceptable for Xpert Xpress SARS-CoV-2/FLU/RSV testing.  Fact Sheet for Patients: EntrepreneurPulse.com.au  Fact Sheet for Healthcare Providers: IncredibleEmployment.be  This test is not yet approved or cleared by the [place] FDA and has been authorized for detection and/or diagnosis of SARS-CoV-2 by FDA under an Emergency Use Authorization (EUA). This EUA will remain in effect (meaning this test can be used) for the duration of the COVID-19 declaration under Section 564(b)(1) of the Act, 21 U.S.C. section 360bbb-3(b)(1), unless the authorization is terminated or revoked.  Performed at [facility], [place] [address], [place], [place] [address]   Culture, blood (Routine X 2) w Reflex to ID Panel     Status: None (Preliminary result)   Collection Time: 10/10/21  9:11 AM   Specimen: BLOOD  Result Value Ref Range Status   Specimen Description   Final    BLOOD BLOOD RIGHT HAND Performed at [facility] [address],  [place], [place] [address]    Special Requests   Final    IN PEDIATRIC BOTTLE Blood Culture results may not be optimal due to an inadequate volume of blood received in culture bottles Performed at [facility] [address], [place], [place] [address]    Culture   Final    NO GROWTH < 24 HOURS Performed at [facility] [address], [place], [place] [address]    Report Status PENDING  Incomplete  Urine Culture     Status: None   Collection Time: 10/10/21  4:58 PM   Specimen: Urine, Clean Catch  Result Value Ref Range Status   Specimen Description   Final  URINE, CLEAN CATCH Performed at [facility], [place] [address] [place], [place] [address]    Special Requests   Final    NONE Performed at [facility], [place] [address] [place], [place] [address]    Culture   Final    NO GROWTH Performed at [facility], [place] [address], [place], [place] [address]    Report Status 10/11/2021 FINAL  Final         Radiology Studies: DG Chest 2 View  Result Date: 10/11/2021 CLINICAL DATA:  Fever. Hx of alcoholic cirrhosis of the liver, ascites, HTN, GERD EXAM: CHEST - 2 VIEW COMPARISON:  Chest radiograph June 12, 23. FINDINGS: Borderline enlargement of the cardiac silhouette. Pulmonary vascular congestion. No consolidation. No visible pleural effusions or pneumothorax. No displaced fracture. IMPRESSION: No consolidation. Borderline cardiomegaly and pulmonary vascular congestion. Electronically Signed   By: Margaretha Sheffield M.D.   On: 10/11/2021 08:45   NM Hepatobiliary Liver Func  Result Date: 10/10/2021 CLINICAL DATA:  Right upper quadrant abdominal pain, with nausea and vomiting. Fever and chills. Cirrhosis. EXAM: NUCLEAR MEDICINE HEPATOBILIARY IMAGING TECHNIQUE: Sequential images of the abdomen were obtained out to 60 minutes following intravenous administration of radiopharmaceutical. RADIOPHARMACEUTICALS:  5.4 mCi Tc-38m   Choletec IV COMPARISON:  None Available. FINDINGS: Prompt uptake and biliary excretion of activity by the liver is seen. Gallbladder activity is visualized, consistent with patency of cystic duct. Biliary activity passes into small bowel, consistent with patent common bile duct. A small amount of biliary activity refluxes into the stomach. IMPRESSION: No evidence of acute cholecystitis or biliary obstruction. Mild bile reflux noted. Electronically Signed   By: Marlaine Hind M.D.   On: 10/10/2021 12:11   US Abdomen Limited RUQ (LIVER/GB)  Result Date: 10/10/2021 CLINICAL DATA:  Right upper quadrant abdominal pain. EXAM: ULTRASOUND ABDOMEN LIMITED RIGHT UPPER QUADRANT COMPARISON:  Ultrasound dated 04/03/2021. FINDINGS: Gallbladder: No gallstones or wall thickening visualized. No sonographic Murphy sign noted by sonographer. Common bile duct: Diameter: 7 mm Liver: Cirrhosis. There is a 12 mm cyst in the right lobe of the liver. Portal vein is patent on color Doppler imaging with normal direction of blood flow towards the liver. Other: None. IMPRESSION: 1. Cirrhosis. 2. Small right hepatic cyst. 3. Patent main portal vein with hepatopetal flow. 4. No ascites. Electronically Signed   By: Anner Crete M.D.   On: 10/10/2021 00:21   DG Chest Portable 1 View  Result Date: 10/09/2021 CLINICAL DATA:  Fever. EXAM: PORTABLE CHEST 1 VIEW COMPARISON:  Chest x-ray 03/04/2018 FINDINGS: Patient is lordotic. The heart size and mediastinal contours are within normal limits. Both lungs are clear. The visualized skeletal structures are unremarkable. IMPRESSION: No active disease. Electronically Signed   By: Ronney Asters M.D.   On: 10/09/2021 23:20   CT ABDOMEN PELVIS W CONTRAST  Result Date: 10/09/2021 CLINICAL DATA:  Abdominal pain, acute, nonlocalized. Fever. History cirrhosis and SBP. EXAM: CT ABDOMEN AND PELVIS WITH CONTRAST TECHNIQUE: Multidetector CT imaging of the abdomen and pelvis was performed using the standard  protocol following bolus administration of intravenous contrast. RADIATION DOSE REDUCTION: This exam was performed according to the departmental dose-optimization program which includes automated exposure control, adjustment of the mA and/or kV according to patient size and/or use of iterative reconstruction technique. CONTRAST:  155mL OMNIPAQUE IOHEXOL 300 MG/ML  SOLN COMPARISON:  Abdominopelvic CT without contrast 09/30/2020 and ultrasound 04/03/2021. Abdominal MRI 12/08/2018. FINDINGS: Lower chest: Clear lung bases. No significant pleural or  pericardial effusion. Hepatobiliary: Diffuse contour irregularity of the liver consistent with cirrhosis again noted. A 9 mm cyst in the right hepatic lobe on image 15/2 is unchanged. No suspicious liver lesions are identified, although the contrast bolus is suboptimal. Mild nonspecific gallbladder wall thickening. No evidence of gallstones or biliary dilatation. Pancreas: Unremarkable. No pancreatic ductal dilatation or surrounding inflammatory changes. Spleen: Mildly progressive splenomegaly. No focal abnormality identified. Adrenals/Urinary Tract: Both adrenal glands appear normal. Numerous nonobstructing bilateral renal calculi are again noted. No evidence of ureteral calculus, hydronephrosis or perinephric soft tissue stranding. The bladder remains incompletely distended, likely accounting for mild wall thickening which appears unchanged. No focal abnormality identified. Stomach/Bowel: No enteric contrast administered. The stomach appears unremarkable for its degree of distension. No evidence of bowel wall thickening, distention or surrounding inflammatory change. The appendix appears normal. Vascular/Lymphatic: There are no enlarged abdominal or pelvic lymph nodes. No acute vascular findings are identified. The portal vein remains distended, and there is a recanalized left paraumbilical vein with multiple dilated intra-abdominal venous collaterals. Reproductive: The  prostate gland and seminal vesicles appear normal. Other: No ascites, free air or focal extraluminal fluid collection. Musculoskeletal: No acute osseous findings are identified. Stable findings of right greater than left developmental hip dysplasia with secondary degenerative changes. There is facet arthropathy in the lower lumbar spine. IMPRESSION: 1. No acute findings or clear explanation for the patient's symptoms. 2. 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 3. Numerous nonobstructing bilateral renal calculi. No evidence of ureteral calculus or hydronephrosis. 4. Stable chronic osseous findings of bilateral hip dysplasia and lumbar facet arthropathy. Electronically Signed   By: Richardean Sale M.D.   On: 10/09/2021 17:10        Scheduled Meds:  pantoprazole (PROTONIX) IV  40 mg Intravenous Q12H   triamcinolone 0.1 % cream : eucerin   Topical BID   Continuous Infusions:  piperacillin-tazobactam (ZOSYN)  IV 3.375 g (10/11/21 1406)     LOS: 1 day    Time spent: 35 minutes.     Elmarie Shiley, MD [facility]   If 7PM-7AM, please contact night-coverage [email or web address]  10/11/2021, 4:56 PM

## 2021-10-11 NOTE — Plan of Care (Signed)
  Problem: Activity: Goal: Risk for activity intolerance will decrease Outcome: Progressing   Problem: Nutrition: Goal: Adequate nutrition will be maintained Outcome: Progressing   Problem: Pain Managment: Goal: General experience of comfort will improve Outcome: Progressing   Problem: Safety: Goal: Ability to remain free from injury will improve Outcome: Progressing   

## 2021-10-11 NOTE — Plan of Care (Signed)
  Problem: Education: Goal: Knowledge of General Education information will improve Description: Including pain rating scale, medication(s)/side effects and non-pharmacologic comfort measures Outcome: Progressing   Problem: Activity: Goal: Risk for activity intolerance will decrease Outcome: Progressing   Problem: Pain Managment: Goal: General experience of comfort will improve Outcome: Progressing   

## 2021-10-11 NOTE — Consult Note (Signed)
Consultation  Referring Provider:  [facility]Regalado Primary Care Physician:  Ladell Pier, MD Primary Gastroenterologist:  Dr. Bryan Lemma and [facility]  Reason for Consultation: EtOH induced cirrhosis, acute illness with chills fever headache nausea vomiting and abdominal pain  HPI: [facility] Jason [name] is a 41 y.o. male, with history of documented EtOH induced cirrhosis.  Patient had admission in 2019 with SBP.  He has been completely abstinent from EtOH since 2020, and is currently primarily being followed by [facility]/Dawn Drazek  NP.  He has had improvement in his parameters over time, has not been listed for transplant and most recent M ELD was 59 in May 2023. He did have surveillance EGD with Dr. Bryan Lemma May 2023 no esophageal varices plan is for follow-up in 2 years  He has not remained on chronic antibiotics after the episode of SBP, though I am not sure when those were dropped. As he was feeling well and in his usual state of health until Monday, 10/09/2021.  He was on his way to get an abdominal ultrasound done, developed headache earlier that morning, fever or chills nausea vomiting and generalized abdominal discomfort.  He has not had any diarrhea, denies any sore throat, no cough.  He does mention that he was cleaning his right ear on Monday and may have poked his eardrum as he started having some bleeding.  He had not been having an earache or any ear congestion prior to that occurrence.  He presented to the ER last evening with the above symptoms.  Noted to be febrile to 102.8 and tachycardic Chest x-ray negative COVID-negative influenza negative, UA negative Labs show WBC of 9.4/hemoglobin 13.7/hematocrit 40.1 platelets 34 Potassium 3.2 BUN 13/creatinine 0.67 Albumin 3.1 Sodium 142 T. bili 1.8/alk phos 97/AST 60/A LT 37-this is about his baseline Lipase 35 Lactate 2.0 Blood cultures are pending  Abdominal ultrasound shows no evidence of  gallstones, cirrhotic appearing liver 12 mm cyst in the right lobe of the liver, patent portal vein, no ascites, CBD 7 mm  HIDA scan negative  He has been hemodynamically stable, was started on Zosyn.  Communicated with the patient via [place] interpreter today.  He says overall he feels much better, has not had any further chills and Tmax 99 today Nausea and vomiting have resolved, he is tolerating clear liquids feels that he could eat regular food. He has had some mild residual abdominal discomfort which is nonfocal. He has some mild discomfort in the right ear, further bleeding. No family members have been ill, no known infectious exposures, again denies any sore throat cough or shortness of breath, no diarrhea or melena  Labs today-WBC 6.9/hemoglobin 13.4/platelets 32 Creat 0.6 Calcitonin normalized  Past Medical History:  Diagnosis Date   Alcoholic cirrhosis of liver ([place])    Alcoholic cirrhosis of liver with ascites ([place]) 06/29/2017   History of heavy, daily drinking. No alcohol since 05/2017. Started treatment 05/2017 with lactulose, thiamine, MVI Spironolactone + furosemide initiated 06/2017.   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 He is currently on lactulose for questionable history of [place] encephalopathy.  No evidence of encephalopathy at today's visit.  Reviewed signs/symptoms of encephalopathy and indication to contact our office.   GERD (gastroesophageal reflux disease)    Hypertension  Nocturia    SBP (spontaneous bacterial peritonitis) ([place]) 05/01/2018   Thrombocytopenia ([place])     Past Surgical History:  Procedure Laterality Date   BIOPSY  08/10/2019   Procedure: BIOPSY;  Surgeon: Lavena Bullion, DO;  Location: [facility];  Service: Gastroenterology;;   BIOPSY  02/08/2020    Procedure: BIOPSY;  Surgeon: Lavena Bullion, DO;  Location: [facility];  Service: Gastroenterology;;   BIOPSY  09/11/2021   Procedure: BIOPSY;  Surgeon: Lavena Bullion, DO;  Location: [facility];  Service: Gastroenterology;;   ESOPHAGEAL BANDING  08/10/2019   Procedure: ESOPHAGEAL BANDING;  Surgeon: Lavena Bullion, DO;  Location: [facility];  Service: Gastroenterology;;   ESOPHAGOGASTRODUODENOSCOPY (EGD) WITH PROPOFOL N/A 08/10/2019   Procedure: ESOPHAGOGASTRODUODENOSCOPY (EGD) WITH PROPOFOL;  Surgeon: Lavena Bullion, DO;  Location: [facility];  Service: Gastroenterology;  Laterality: N/A;   ESOPHAGOGASTRODUODENOSCOPY (EGD) WITH PROPOFOL N/A 02/08/2020   Procedure: ESOPHAGOGASTRODUODENOSCOPY (EGD) WITH PROPOFOL;  Surgeon: Lavena Bullion, DO;  Location: [facility];  Service: Gastroenterology;  Laterality: N/A;   ESOPHAGOGASTRODUODENOSCOPY (EGD) WITH PROPOFOL N/A 09/11/2021   Procedure: ESOPHAGOGASTRODUODENOSCOPY (EGD) WITH PROPOFOL;  Surgeon: Lavena Bullion, DO;  Location: [facility];  Service: Gastroenterology;  Laterality: N/A;   FOOT SURGERY Left    around 2014. a scar on lower leg   ORIF ANKLE FRACTURE Right 06/09/2019   Procedure: RIGHT OPEN REDUCTION INTERNAL FIXATION (ORIF) ANKLE FRACTURE;  Surgeon: Renette Butters, MD;  Location: [facility];  Service: Orthopedics;  Laterality: Right;    Prior to Admission medications   Medication Sig Start Date End Date Taking? Authorizing Provider  bismuth subsalicylate (PEPTO BISMOL) 262 MG chewable tablet Chew 524 mg by mouth as needed for indigestion or diarrhea or loose stools.   Yes [provider]  famotidine (PEPCID) 20 MG tablet Take 1 tablet (20 mg total) by mouth daily. 07/10/21 07/10/22 Yes Cirigliano, Vito V, DO  folic acid (FOLVITE) 1 MG tablet Take 1 tablet (1 mg total) by mouth daily. 07/11/21  Yes Ladell Pier, MD  furosemide (LASIX) 40 MG tablet Take 1 tablet (40 mg total) by  mouth daily. 07/11/21  Yes Ladell Pier, MD  ibuprofen (ADVIL) 200 MG tablet Take 200 mg by mouth every 6 (six) hours as needed for mild pain or moderate pain.   Yes [provider]  lactulose (CHRONULAC) 10 GM/15ML solution TAKE 30 MLS BY MOUTH 2 TIMES DAILY Patient taking differently: Take 20 g by mouth 2 (two) times daily. 02/27/21  Yes Ladell Pier, MD  Multiple Vitamin (MULTIVITAMIN WITH MINERALS) TABS tablet Take 1 tablet by mouth daily. 05/10/18  Yes Emokpae, Courage, MD  potassium chloride SA (KLOR-CON M) 20 MEQ tablet TAKE 1 TABLET (20 MEQ TOTAL) BY MOUTH 2 (TWO) TIMES DAILY. Patient taking differently: Take 20 mEq by mouth 2 (two) times daily. 09/13/21 09/13/22 Yes Ladell Pier, MD  spironolactone (ALDACTONE) 25 MG tablet Take 1 tablet (25 mg total) by mouth daily. 07/11/21  Yes Ladell Pier, MD  thiamine 100 MG tablet Take 1 tablet (100 mg total) by mouth daily. 06/10/18  Yes Ladell Pier, MD    Current Facility-Administered Medications  Medication Dose Route Frequency Provider Last Rate Last Admin   oxyCODONE (Oxy IR/ROXICODONE) immediate release tablet 5 mg  5 mg Oral Q4H PRN Kristopher Oppenheim, DO   5 mg at 10/11/21 0551   Or   HYDROmorphone (DILAUDID) injection 0.5 mg  0.5 mg Intravenous Q4H PRN Kristopher Oppenheim,  DO   0.5 mg at 10/10/21 1305   ondansetron (ZOFRAN) tablet 4 mg  4 mg Oral Q6H PRN Kristopher Oppenheim, DO       Or   ondansetron [facility]) injection 4 mg  4 mg Intravenous Q6H PRN Kristopher Oppenheim, DO   4 mg at 10/10/21 1304   pantoprazole (PROTONIX) injection 40 mg  40 mg Intravenous Q12H Kristopher Oppenheim, DO   40 mg at 10/11/21 0800   piperacillin-tazobactam (ZOSYN) IVPB 3.375 g  3.375 g Intravenous Q8H Reginia Naas, RPH 12.5 mL/hr at 10/11/21 0546 3.375 g at 10/11/21 0546   triamcinolone 0.1 % cream : eucerin cream, 1:1   Topical BID Regalado, Belkys A, MD   Given at 10/11/21 1142    Allergies as of 10/09/2021   (No Known Allergies)    Family History   Problem Relation Age of Onset   Colon cancer Neg Hx    Esophageal cancer Neg Hx    Healthy Mother    Healthy Daughter     Social History   Socioeconomic History   Marital status: Legally Separated    Spouse name: Not on file   Number of children: Not on file   Years of education: Not on file   Highest education level: Not on file  Occupational History   Occupation: Curator Engineer, materials)  Tobacco Use   Smoking status: Former   Smokeless tobacco: Never   Tobacco comments:    said he has tried it  Scientific laboratory technician Use: Never used  Substance and Sexual Activity   Alcohol use: Not Currently    Comment: 03/05/2018   Drug use: Never   Sexual activity: Yes  Other Topics Concern   Not on file  Social History Narrative   ** Merged History Encounter **       Lives in [place] with wife and daughter.    Social Determinants of Health   Financial Resource Strain: Not on file  Food Insecurity: Not on file  Transportation Needs: Not on file  Physical Activity: Not on file  Stress: Not on file  Social Connections: Not on file  Intimate Partner Violence: Not on file    Review of Systems: Pertinent positive and negative review of systems were noted in the above HPI section.  All other review of systems was otherwise negative.   Physical Exam: Vital signs in last 24 hours: Temp:  [98.6 F (37 C)-99 F (37.2 C)] 98.8 F (37.1 C) (06/14 0545) Pulse Rate:  [61-69] 61 (06/14 0545) Resp:  [17] 17 (06/14 0545) BP: (106-131)/(51-71) 106/56 (06/14 0545) SpO2:  [99 %] 99 % (06/14 0545) Weight:  [119.5 kg] 119.5 kg (06/14 0545) Last BM Date : 10/09/21 General:   Alert,  Well-developed, obese Hispanic male pleasant and cooperative in NAD Head:  Normocephalic and atraumatic. Eyes:  Sclera clear, no icterus.   Conjunctiva pink. Ears:  Normal auditory acuity.  I did not examine the ear Nose:  No deformity, discharge,  or lesions. Mouth:  No deformity or lesions.   Neck:   Supple; no masses or thyromegaly. Lungs:  Clear throughout to auscultation.   No wheezes, crackles, or rhonchi.  Heart:  Regular rate and rhythm; no murmurs, clicks, rubs,  or gallops. Abdomen:  Soft, obese, there is no focal tenderness, no guarding or rebound, bowel sounds are present, no palpable mass or hepatosplenomegaly.   Rectal: Not done Msk:  Symmetrical without gross deformities. . Pulses:  Normal pulses noted. Extremities:  Without  clubbing or edema. Neurologic:  Alert and  oriented x4;  grossly normal neurologically. Skin:  Intact without significant lesions or rashes.. Psych:  Alert and cooperative. Normal mood and affect.  Intake/Output from previous day: 06/13 0701 - 06/14 0700 In: 1267.1 [P.O.:600; I.V.:48.7; IV Piggyback:618.4] Out: -  Intake/Output this shift: Total I/O In: 153.9 [P.O.:120; I.V.:5.7; IV Piggyback:28.3] Out: -   Lab Results: Recent Labs    10/09/21 1551 10/10/21 0911 10/11/21 0353  WBC 12.2* 9.4 6.9  HGB 15.2 13.7 13.4  HCT 42.3 40.1 38.6*  PLT 43* 34* 32*   BMET Recent Labs    10/09/21 1551 10/10/21 0911 10/11/21 0353  NA 139 142 136  K 3.0* 3.2* 3.6  CL 111 114* 111  CO2 20* 21* 21*  GLUCOSE 116* 116* 97  BUN _0 CREATININE 0.58* 0.67 0.60*  CALCIUM 8.8* 8.0* 7.7*   LFT Recent Labs    10/11/21 0353  PROT 5.6*  ALBUMIN 2.8*  AST 56*  ALT 34  ALKPHOS 86  BILITOT 2.0*  BILIDIR 0.6*  IBILI 1.4*   PT/INR No results for input(s): "LABPROT", "INR" in the last 72 hours. Hepatitis Panel No results for input(s): "HEPBSAG", "HCVAB", "HEPAIGM", "HEPBIGM" in the last 72 hours.   IMPRESSION:   #18 41 year old Hispanic male with alcohol induced cirrhosis, recompensated after complete discontinuation of EtOH use 2020.  He has been stable with improvement in parameters and most recent M ELD of 11 EGD May 2023 no varices Ultrasound no ascites Chronic thrombocytopenia  #2 history of SBP 2019-he was on prophylactic  antibiotics for a while, has not been on for some time  #3 acute illness onset 48 hours ago with headache, fever chills nausea vomiting and generalized abdominal discomfort Work-up thus far has been negative-negative respiratory panel, chest x-ray, ultrasound and HIDA scan both negative Patient is feeling much better Suspect this may be an acute viral syndrome Patient's hepatic parameters are at his baseline-I do not see any evidence of hepatic decompensation related to this acute illness  #4-mild right ear discomfort, bleeding Monday after he tried to clean his ear-needs exam, defer to hospitalist  PLAN: We will advance to regular bland diet Continue Zosyn pending results of blood cultures Check INR No other recommendations from GI perspective at this time He will return to follow-up with [facility] on discharge He continues to improve, and blood cultures negative, may be able to be discharged tomorrow    Lottie Siska PA-C 10/11/2021, 11:57 AM

## 2021-10-12 ENCOUNTER — Other Ambulatory Visit: Payer: Self-pay

## 2021-10-12 DIAGNOSIS — R1013 Epigastric pain: Secondary | ICD-10-CM | POA: Diagnosis not present

## 2021-10-12 LAB — CBC WITH DIFFERENTIAL/PLATELET
Abs Immature Granulocytes: 0.03 10*3/uL (ref 0.00–0.07)
Basophils Absolute: 0 10*3/uL (ref 0.0–0.1)
Basophils Relative: 1 %
Eosinophils Absolute: 0.2 10*3/uL (ref 0.0–0.5)
Eosinophils Relative: 3 %
HCT: 39.7 % (ref 39.0–52.0)
Hemoglobin: 13.7 g/dL (ref 13.0–17.0)
Immature Granulocytes: 1 %
Lymphocytes Relative: 33 %
Lymphs Abs: 1.7 10*3/uL (ref 0.7–4.0)
MCH: 33.2 pg (ref 26.0–34.0)
MCHC: 34.5 g/dL (ref 30.0–36.0)
MCV: 96.1 fL (ref 80.0–100.0)
Monocytes Absolute: 0.7 10*3/uL (ref 0.1–1.0)
Monocytes Relative: 13 %
Neutro Abs: 2.6 10*3/uL (ref 1.7–7.7)
Neutrophils Relative %: 49 %
Platelets: 42 10*3/uL — ABNORMAL LOW (ref 150–400)
RBC: 4.13 MIL/uL — ABNORMAL LOW (ref 4.22–5.81)
RDW: 14.4 % (ref 11.5–15.5)
WBC: 5.2 10*3/uL (ref 4.0–10.5)
nRBC: 0 % (ref 0.0–0.2)

## 2021-10-12 LAB — COMPREHENSIVE METABOLIC PANEL WITH GFR
ALT: 31 U/L (ref 0–44)
AST: 74 U/L — ABNORMAL HIGH (ref 15–41)
Albumin: 2.7 g/dL — ABNORMAL LOW (ref 3.5–5.0)
Alkaline Phosphatase: 80 U/L (ref 38–126)
Anion gap: 6 (ref 5–15)
BUN: 9 mg/dL (ref 6–20)
CO2: 20 mmol/L — ABNORMAL LOW (ref 22–32)
Calcium: 8 mg/dL — ABNORMAL LOW (ref 8.9–10.3)
Chloride: 112 mmol/L — ABNORMAL HIGH (ref 98–111)
Creatinine, Ser: 0.46 mg/dL — ABNORMAL LOW (ref 0.61–1.24)
GFR, Estimated: >60 mL/min
Glucose, Bld: 107 mg/dL — ABNORMAL HIGH (ref 70–99)
Potassium: 5.1 mmol/L (ref 3.5–5.1)
Sodium: 138 mmol/L (ref 135–145)
Total Bilirubin: 2.1 mg/dL — ABNORMAL HIGH (ref 0.3–1.2)
Total Protein: 5.3 g/dL — ABNORMAL LOW (ref 6.5–8.1)

## 2021-10-12 LAB — PROCALCITONIN: Procalcitonin: 0.65 ng/mL

## 2021-10-12 MED ORDER — PANTOPRAZOLE SODIUM 40 MG PO TBEC
40.0000 mg | DELAYED_RELEASE_TABLET | Freq: Every day | ORAL | 0 refills | Status: DC
  Filled 2021-10-12: qty 30, 30d supply, fill #0

## 2021-10-12 MED ORDER — CEPHALEXIN 500 MG PO CAPS
500.0000 mg | ORAL_CAPSULE | Freq: Two times a day (BID) | ORAL | 0 refills | Status: DC
  Filled 2021-10-12: qty 10, 5d supply, fill #0

## 2021-10-12 MED ORDER — PANTOPRAZOLE SODIUM 40 MG PO TBEC
40.0000 mg | DELAYED_RELEASE_TABLET | Freq: Two times a day (BID) | ORAL | Status: DC
  Administered 2021-10-12: 40 mg via ORAL
  Filled 2021-10-12: qty 1

## 2021-10-12 MED ORDER — CEPHALEXIN 500 MG PO CAPS
500.0000 mg | ORAL_CAPSULE | Freq: Two times a day (BID) | ORAL | Status: DC
  Administered 2021-10-12: 500 mg via ORAL
  Filled 2021-10-12: qty 1

## 2021-10-12 MED ORDER — FUROSEMIDE 40 MG PO TABS
40.0000 mg | ORAL_TABLET | Freq: Every day | ORAL | Status: DC
  Administered 2021-10-12: 40 mg via ORAL
  Filled 2021-10-12: qty 1

## 2021-10-12 NOTE — Progress Notes (Signed)
Pharmacy Antibiotic Note  Jason [name] Jason [name] is a 41 y.o. male admitted on 10/09/2021 with  r/o intraabdominal infection .  Pharmacy has been consulted for Zosyn dosing.  ID: Zosyn for fever of unknown origin. R/o intra-abdominal infection although imaging not impressive - WBC 5.2, PC 1.2>0.65, Tmax 99.3, Scr <1  UA neg CXR neg  CT AP neg. 6/13 UCx: neg 6/13 BCx ngtd  Plan: Zosyn 3.375g IV q8h (4 hour infusion). Pharmacy will sign off. Please reconsult for further dosing assitance.    Height: 5\' 5"  (165.1 cm) Weight: 54.1 kg (119 lb 4.7 oz) IBW/kg (Calculated) : 61.5  Temp (24hrs), Avg:99.1 F (37.3 C), Min:99 F (37.2 C), Max:99.3 F (37.4 C)  Recent Labs  Lab 10/09/21 1551 10/09/21 1552 10/10/21 0911 10/10/21 0957 10/11/21 0353 10/12/21 0400  WBC 12.2*  --  9.4  --  6.9 5.2  CREATININE 0.58*  --  0.67  --  0.60* 0.46*  LATICACIDVEN  --  1.4  --  2.0*  --   --     Estimated Creatinine Clearance: 95.8 mL/min (A) (by C-G formula based on SCr of 0.46 mg/dL (L)).    Allergies  Allergen Reactions   Acetaminophen Other (See Comments)    Cirrhosis of the liver    Jason [name] S. 10/14/21, PharmD, BCPS Clinical Staff Pharmacist [email or web address]  Jason [name] 10/12/2021 7:19 AM

## 2021-10-12 NOTE — Discharge Summary (Signed)
Physician Discharge Summary   Patient: Jason [name] MRN: [id number] DOB: 1980/05/24  Admit date:     10/09/2021  Discharge date: 10/12/21  Discharge Physician: Elmarie Shiley   PCP: Ladell Pier, MD   Recommendations at discharge:    Needs B-met to follow renal function and potassium level. Consider resumption of spironolactone if no hyperkalemia on Bmet.  Follow resolution of Cellulitis.   Discharge Diagnoses: Principal Problem:   Abdominal pain Active Problems:   Fever   Thrombocytopenia (HCC)   Alcoholic cirrhosis (HCC)   Hypokalemia   Alcohol use disorder, severe, in sustained remission ([place])  Resolved Problems:   * No resolved hospital problems. *  Hospital Course: 41 year old with past medical history significant for alcoholic cirrhosis, history of SBP, history of ascites, chronic thrombocytopenia, obesity presented to the ED with 1 day history of nausea, vomiting abdominal pain fevers and chills.  He was recently seen by transplant clinic at [facility] on 09/18/2021.  He is not on the transplant list.  His MELD score has improved.  Synthetic function according to them has improved.  He is no longer having ascites.  He stopped drinking alcohol 2020.   He was found to have a temperature of 102 heart rate 120, leukocytosis, COVID test negative, UA unremarkable, lipase 35, CT scan; no acute finding of clear explanation for the patient's symptoms.  Ultrasound , small right hepatic cyst, patent main portal vein, no ascites.  HIDA scan no evidence of cholecystitis or bilaterally obstruction.  Mild bile reflux noted.  Assessment and Plan:   1-Abdominal pain:  -CT abdomen and pelvis:no acute finding of clear explanation for the patient's symptoms. -Ultrasound , small right hepatic cyst, patent main portal vein, no ascites.   -HIDA scan no evidence of cholecystitis or bilaterally obstruction.  Mild bile reflux noted. -Continue with  Protonix -Denies  diarrhea. Lipase normal  -unclear etiology  -mild transaminases, monitor.  GI consulted. no further recommendation. ? Viral illness.    2-Fever; ? LE cellulitis.  UA negative.  Chest x ray: no active diseases.  No ascites  on [place].  Blood culture: No growth to date. . Urine culture/ No growth  Treated  with Zosyn.  Covid negative.  Repeated chest x ray negative for PNA.  He has remain afebrile.  Pro-calcitonin elevated, treated with antibiotics.  He has rash left LE. Will cover for cellulitis.  He will be discharge on Keflex  3-Alcohol  use disorder, severe, in sustained remission   Alcoholic cirrhosis: He has been seen by [facility] transplant.  His MELD score has improved.  He is not on the transplant list. Continue to abstain from alcohol.   Thrombocytopenia chronic due to alcoholic cirrhosis.  Monitor   Hypokalemia; Replaced.  Headache: CT head negative.    Estimated body mass index is 43.84 kg/m as calculated from the following:   Height as of this encounter: _0  (1.651 m).   Weight as of this encounter: 119.5 kg.          Consultants: GI Procedures performed: HIDA, [place] Disposition: Home Diet recommendation:  Discharge Diet Orders (From admission, onward)     Start     Ordered   10/12/21 0000  Diet - low sodium heart healthy        10/12/21 0955           Cardiac diet DISCHARGE MEDICATION: Allergies as of 10/12/2021       Reactions   Acetaminophen Other (See Comments)  Cirrhosis of the liver        Medication List     STOP taking these medications    ibuprofen 200 MG tablet Commonly known as: ADVIL   potassium chloride SA 20 MEQ tablet Commonly known as: KLOR-CON M   spironolactone 25 MG tablet Commonly known as: ALDACTONE       TAKE these medications    bismuth subsalicylate 347 MG chewable tablet Commonly known as: PEPTO BISMOL Chew 524 mg by mouth as needed for indigestion or diarrhea or loose stools.   cephALEXin 500 MG  capsule Commonly known as: KEFLEX Take 1 capsule (500 mg total) by mouth once every 12 (twelve) hours for 5 days.   famotidine 20 MG tablet Commonly known as: Pepcid Tome 1 tableta (20 mg en total) por va oral diariamente. (Take 1 tablet (20 mg total) by mouth daily.)   folic acid 1 MG tablet Commonly known as: FOLVITE Tome 1 tableta (1 mg en total) por va oral diariamente. (Take 1 tablet (1 mg total) by mouth daily.)   furosemide 40 MG tablet Commonly known as: LASIX Tome 1 tableta (40 mg en total) por va oral diariamente. (Take 1 tablet (40 mg total) by mouth daily.)   lactulose 10 GM/15ML solution Commonly known as: CHRONULAC TAKE 30 MLS BY MOUTH 2 TIMES DAILY What changed:  how much to take how to take this when to take this   multivitamin with minerals Tabs tablet Take 1 tablet by mouth daily.   pantoprazole 40 MG tablet Commonly known as: PROTONIX Tome 1 tableta (40 mg en total) por va oral diariamente. (Take 1 tablet (40 mg total) by mouth daily.)   thiamine 100 MG tablet Take 1 tablet (100 mg total) by mouth daily.        Follow-up Information     Ladell Pier, MD Follow up in 1 week(s).   Specialty: Internal Medicine Contact information: [address] [place] [address] [phone]                Discharge Exam: Danley Danker Weights   10/10/21 0955 10/11/21 0545 10/12/21 0615  Weight: 108.4 kg 119.5 kg 54.1 kg   General; NAD Lung; CTA LE mild rash   Condition at discharge: stable  The results of significant diagnostics from this hospitalization (including imaging, microbiology, ancillary and laboratory) are listed below for reference.   Imaging Studies: CT HEAD WO CONTRAST (5MM)  Result Date: 10/11/2021 CLINICAL DATA:  Headache. EXAM: CT HEAD WITHOUT CONTRAST TECHNIQUE: Contiguous axial images were obtained from the base of the skull through the vertex without intravenous contrast. RADIATION DOSE REDUCTION: This  exam was performed according to the departmental dose-optimization program which includes automated exposure control, adjustment of the mA and/or kV according to patient size and/or use of iterative reconstruction technique. COMPARISON:  Head CT dated 03/04/2018. FINDINGS: Brain: The ventricles and sulci are appropriate size for the patient's age. The gray-white matter discrimination is preserved. There is no acute intracranial hemorrhage. No mass effect or midline shift. No extra-axial fluid collection. Vascular: No hyperdense vessel or unexpected calcification. Skull: Normal. Negative for fracture or focal lesion. Sinuses/Orbits: There is a 2 cm right maxillary sinus retention cyst or polyp. The visualized paranasal sinuses and mastoid air cells are otherwise clear. Other: None IMPRESSION: No acute intracranial pathology. Electronically Signed   By: Anner Crete M.D.   On: 10/11/2021 19:23   DG Chest 2 View  Result Date: 10/11/2021 CLINICAL DATA:  Fever.  Hx of alcoholic cirrhosis of the liver, ascites, HTN, GERD EXAM: CHEST - 2 VIEW COMPARISON:  Chest radiograph June 12, 23. FINDINGS: Borderline enlargement of the cardiac silhouette. Pulmonary vascular congestion. No consolidation. No visible pleural effusions or pneumothorax. No displaced fracture. IMPRESSION: No consolidation. Borderline cardiomegaly and pulmonary vascular congestion. Electronically Signed   By: Margaretha Sheffield M.D.   On: 10/11/2021 08:45   NM Hepatobiliary Liver Func  Result Date: 10/10/2021 CLINICAL DATA:  Right upper quadrant abdominal pain, with nausea and vomiting. Fever and chills. Cirrhosis. EXAM: NUCLEAR MEDICINE HEPATOBILIARY IMAGING TECHNIQUE: Sequential images of the abdomen were obtained out to 60 minutes following intravenous administration of radiopharmaceutical. RADIOPHARMACEUTICALS:  5.4 mCi Tc-18m Choletec IV COMPARISON:  None Available. FINDINGS: Prompt uptake and biliary excretion of activity by the liver is  seen. Gallbladder activity is visualized, consistent with patency of cystic duct. Biliary activity passes into small bowel, consistent with patent common bile duct. A small amount of biliary activity refluxes into the stomach. IMPRESSION: No evidence of acute cholecystitis or biliary obstruction. Mild bile reflux noted. Electronically Signed   By: JMarlaine HindM.D.   On: 10/10/2021 12:11   [place]domen Limited RUQ (LIVER/GB)  Result Date: 10/10/2021 CLINICAL DATA:  Right upper quadrant abdominal pain. EXAM: ULTRASOUND ABDOMEN LIMITED RIGHT UPPER QUADRANT COMPARISON:  Ultrasound dated 04/03/2021. FINDINGS: Gallbladder: No gallstones or wall thickening visualized. No sonographic Murphy sign noted by sonographer. Common bile duct: Diameter: 7 mm Liver: Cirrhosis. There is a 12 mm cyst in the right lobe of the liver. Portal vein is patent on color Doppler imaging with normal direction of blood flow towards the liver. Other: None. IMPRESSION: 1. Cirrhosis. 2. Small right hepatic cyst. 3. Patent main portal vein with hepatopetal flow. 4. No ascites. Electronically Signed   By: AAnner CreteM.D.   On: 10/10/2021 00:21   DG Chest Portable 1 View  Result Date: 10/09/2021 CLINICAL DATA:  Fever. EXAM: PORTABLE CHEST 1 VIEW COMPARISON:  Chest x-ray 03/04/2018 FINDINGS: Patient is lordotic. The heart size and mediastinal contours are within normal limits. Both lungs are clear. The visualized skeletal structures are unremarkable. IMPRESSION: No active disease. Electronically Signed   By: ARonney AstersM.D.   On: 10/09/2021 23:20   CT ABDOMEN PELVIS W CONTRAST  Result Date: 10/09/2021 CLINICAL DATA:  Abdominal pain, acute, nonlocalized. Fever. History cirrhosis and SBP. EXAM: CT ABDOMEN AND PELVIS WITH CONTRAST TECHNIQUE: Multidetector CT imaging of the abdomen and pelvis was performed using the standard protocol following bolus administration of intravenous contrast. RADIATION DOSE REDUCTION: This exam was performed  according to the departmental dose-optimization program which includes automated exposure control, adjustment of the mA and/or kV according to patient size and/or use of iterative reconstruction technique. CONTRAST:  1029mOMNIPAQUE IOHEXOL 300 MG/ML  SOLN COMPARISON:  Abdominopelvic CT without contrast 09/30/2020 and ultrasound 04/03/2021. Abdominal MRI 12/08/2018. FINDINGS: Lower chest: Clear lung bases. No significant pleural or pericardial effusion. Hepatobiliary: Diffuse contour irregularity of the liver consistent with cirrhosis again noted. A 9 mm cyst in the right hepatic lobe on image 15/2 is unchanged. No suspicious liver lesions are identified, although the contrast bolus is suboptimal. Mild nonspecific gallbladder wall thickening. No evidence of gallstones or biliary dilatation. Pancreas: Unremarkable. No pancreatic ductal dilatation or surrounding inflammatory changes. Spleen: Mildly progressive splenomegaly. No focal abnormality identified. Adrenals/Urinary Tract: Both adrenal glands appear normal. Numerous nonobstructing bilateral renal calculi are again noted. No evidence of ureteral calculus, hydronephrosis or perinephric soft tissue stranding. The  bladder remains incompletely distended, likely accounting for mild wall thickening which appears unchanged. No focal abnormality identified. Stomach/Bowel: No enteric contrast administered. The stomach appears unremarkable for its degree of distension. No evidence of bowel wall thickening, distention or surrounding inflammatory change. The appendix appears normal. Vascular/Lymphatic: There are no enlarged abdominal or pelvic lymph nodes. No acute vascular findings are identified. The portal vein remains distended, and there is a recanalized left paraumbilical vein with multiple dilated intra-abdominal venous collaterals. Reproductive: The prostate gland and seminal vesicles appear normal. Other: No ascites, free air or focal extraluminal fluid collection.  Musculoskeletal: No acute osseous findings are identified. Stable findings of right greater than left developmental hip dysplasia with secondary degenerative changes. There is facet arthropathy in the lower lumbar spine. IMPRESSION: 1. No acute findings or clear explanation for the patient's symptoms. 2. 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 3. Numerous nonobstructing bilateral renal calculi. No evidence of ureteral calculus or hydronephrosis. 4. Stable chronic osseous findings of bilateral hip dysplasia and lumbar facet arthropathy. Electronically Signed   By: Richardean Sale M.D.   On: 10/09/2021 17:10   ECHOCARDIOGRAM COMPLETE BUBBLE STUDY  Result Date: 10/03/2021    ECHOCARDIOGRAM LIMITED REPORT   Patient Name:   HASSON GASPARD Date of Exam: 10/03/2021 Medical Rec #:  [id number]                       Height:       65.0 in Accession #:    [id number]                      Weight:       243.6 lb Date of Birth:  01-20-1981                      BSA:          2.151 m Patient Age:    41 years                        BP:           129/73 mmHg Patient Gender: M                               HR:           65 bpm. Exam Location:  Outpatient Procedure: 2D Echo, Cardiac Doppler, Color Doppler and Saline Contrast Bubble            Study Indications:    Alcoholic cirrhosis of the liver with ascites  History:        Patient has no prior history of Echocardiogram examinations.                 Risk Factors:Hypertension and Former Smoker.  Sonographer:    Jyl Heinz Referring Phys: [id number] DAWN [place]  Sonographer Comments: Image acquisition challenging due to patient body habitus. IMPRESSIONS  1. Left ventricular ejection fraction by 3D volume is 56 %. The left ventricle has no regional wall motion abnormalities.  2. Right ventricular systolic function is normal. The right ventricular size is mildly  enlarged.  3. The mitral valve is normal in structure. No evidence of mitral valve regurgitation. No evidence of mitral stenosis.  4. The aortic valve is normal in structure. Aortic valve regurgitation  is not visualized. No aortic stenosis is present.  5. The inferior vena cava is normal in size with greater than 50% respiratory variability, suggesting right atrial pressure of 3 mmHg. FINDINGS  Left Ventricle: Left ventricular ejection fraction by 3D volume is 56 %. The left ventricle has no regional wall motion abnormalities. The left ventricular internal cavity size was normal in size. There is no left ventricular hypertrophy. Right Ventricle: The right ventricular size is mildly enlarged. No increase in right ventricular wall thickness. Right ventricular systolic function is normal. Left Atrium: Left atrial size was normal in size. Right Atrium: Right atrial size was normal in size. Pericardium: There is no evidence of pericardial effusion. Mitral Valve: The mitral valve is normal in structure. No evidence of mitral valve stenosis. Tricuspid Valve: The tricuspid valve is normal in structure. Tricuspid valve regurgitation is not demonstrated. No evidence of tricuspid stenosis. Aortic Valve: The aortic valve is normal in structure. Aortic valve regurgitation is not visualized. No aortic stenosis is present. Aortic valve peak gradient measures 10.0 mmHg. Pulmonic Valve: The pulmonic valve was normal in structure. Pulmonic valve regurgitation is not visualized. No evidence of pulmonic stenosis. Aorta: The aortic root is normal in size and structure. Venous: The inferior vena cava is normal in size with greater than 50% respiratory variability, suggesting right atrial pressure of 3 mmHg. IAS/Shunts: No atrial level shunt detected by color flow Doppler. Agitated saline contrast was given intravenously to evaluate for intracardiac shunting. LEFT VENTRICLE PLAX 2D LVIDd:         5.80 cm         Diastology LVIDs:          3.70 cm         LV e' medial:    6.53 cm/s LV PW:         1.20 cm         LV E/e' medial:  11.8 LV IVS:        1.00 cm         LV e' lateral:   9.90 cm/s LVOT diam:     2.00 cm         LV E/e' lateral: 7.8 LV SV:         70 LV SV Index:   32 LVOT Area:     3.14 cm        3D Volume EF                                LV 3D EF:    Left                                             ventricul LV Volumes (MOD)                            ar LV vol d, MOD    134.0 ml                   ejection A2C:                                        fraction LV vol d, MOD  149.0 ml                   by 3D A4C:                                        volume is LV vol s, MOD    54.2 ml                    56 %. A2C: LV vol s, MOD    64.8 ml A4C:                           3D Volume EF: LV SV MOD A2C:   79.8 ml       3D EF:        56 % LV SV MOD A4C:   149.0 ml      LV EDV:       235 ml LV SV MOD BP:    82.2 ml       LV ESV:       104 ml                                LV SV:        131 ml RIGHT VENTRICLE             IVC RV Basal diam:  4.00 cm     IVC diam: 1.60 cm RV Mid diam:    3.80 cm RV S prime:     14.70 cm/s TAPSE (M-mode): 2.6 cm LEFT ATRIUM             Index        RIGHT ATRIUM           Index LA diam:        4.00 cm 1.86 cm/m   RA Area:     20.60 cm LA Vol (A2C):   60.9 ml 28.31 ml/m  RA Volume:   63.60 ml  29.57 ml/m LA Vol (A4C):   75.6 ml 35.14 ml/m LA Biplane Vol: 71.6 ml 33.29 ml/m  AORTIC VALVE AV Area (Vmax): 2.13 cm AV Vmax:        158.00 cm/s AV Peak Grad:   10.0 mmHg LVOT Vmax:      107.00 cm/s LVOT Vmean:     80.500 cm/s LVOT VTI:       0.222 m  AORTA Ao Root diam: 3.20 cm Ao Asc diam:  2.60 cm MITRAL VALVE MV Area (PHT): 3.58 cm    SHUNTS MV Decel Time: 212 msec    Systemic VTI:  0.22 m MV E velocity: 77.10 cm/s  Systemic Diam: 2.00 cm MV A velocity: 56.20 cm/s MV E/A ratio:  1.37 Kardie Tobb DO Electronically signed by Berniece Salines DO Signature Date/Time: 10/03/2021/1:52:23 PM    Final     Microbiology: Results  for orders placed or performed during the hospital encounter of 10/09/21  Blood culture (routine x 2)     Status: None (Preliminary result)   Collection Time: 10/09/21  3:51 PM   Specimen: BLOOD  Result Value Ref Range Status   Specimen Description BLOOD LEFT ANTECUBITAL  Final   Special Requests   Final    BOTTLES DRAWN AEROBIC AND ANAEROBIC Blood Culture adequate volume   Culture  Final    NO GROWTH 3 DAYS Performed at [facility], [place] [address], [place], [place] [address]    Report Status PENDING  Incomplete  Resp Panel by RT-PCR (Flu A&B, Covid) Anterior Nasal Swab     Status: None   Collection Time: 10/09/21 10:51 PM   Specimen: Anterior Nasal Swab  Result Value Ref Range Status   SARS Coronavirus 2 by RT PCR NEGATIVE NEGATIVE Final    Comment: (NOTE) SARS-CoV-2 target nucleic acids are NOT DETECTED.  The SARS-CoV-2 RNA is generally detectable in upper respiratory specimens during the acute phase of infection. The lowest concentration of SARS-CoV-2 viral copies this assay can detect is 138 copies/mL. A negative result does not preclude SARS-Cov-2 infection and should not be used as the sole basis for treatment or other patient management decisions. A negative result may occur with  improper specimen collection/handling, submission of specimen other than nasopharyngeal swab, presence of viral mutation(s) within the areas targeted by this assay, and inadequate number of viral copies(<138 copies/mL). A negative result must be combined with clinical observations, patient history, and epidemiological information. The expected result is Negative.  Fact Sheet for Patients:  EntrepreneurPulse.com.au  Fact Sheet for Healthcare Providers:  IncredibleEmployment.be  This test is no t yet approved or cleared by the [place] FDA and  has been authorized for detection and/or diagnosis of SARS-CoV-2 by FDA under an Emergency Use  Authorization (EUA). This EUA will remain  in effect (meaning this test can be used) for the duration of the COVID-19 declaration under Section 564(b)(1) of the Act, 21 U.S.C.section 360bbb-3(b)(1), unless the authorization is terminated  or revoked sooner.       Influenza A by PCR NEGATIVE NEGATIVE Final   Influenza B by PCR NEGATIVE NEGATIVE Final    Comment: (NOTE) The Xpert Xpress SARS-CoV-2/FLU/RSV plus assay is intended as an aid in the diagnosis of influenza from Nasopharyngeal swab specimens and should not be used as a sole basis for treatment. Nasal washings and aspirates are unacceptable for Xpert Xpress SARS-CoV-2/FLU/RSV testing.  Fact Sheet for Patients: EntrepreneurPulse.com.au  Fact Sheet for Healthcare Providers: IncredibleEmployment.be  This test is not yet approved or cleared by the [place] FDA and has been authorized for detection and/or diagnosis of SARS-CoV-2 by FDA under an Emergency Use Authorization (EUA). This EUA will remain in effect (meaning this test can be used) for the duration of the COVID-19 declaration under Section 564(b)(1) of the Act, 21 U.S.C. section 360bbb-3(b)(1), unless the authorization is terminated or revoked.  Performed at [facility], [place] [address] [place], [place] [address]   Culture, blood (Routine X 2) w Reflex to ID Panel     Status: None (Preliminary result)   Collection Time: 10/10/21  9:11 AM   Specimen: BLOOD  Result Value Ref Range Status   Specimen Description   Final    BLOOD BLOOD RIGHT HAND Performed at [place] [address], [place], [place] [address]    Special Requests   Final    IN PEDIATRIC BOTTLE Blood Culture results may not be optimal due to an inadequate volume of blood received in culture bottles Performed at [place] [address], [place], [place] [address]    Culture   Final    NO  GROWTH 2 DAYS Performed at [facility] [address], [place], [place] [address]    Report Status PENDING  Incomplete  Urine Culture  Status: None   Collection Time: 10/10/21  4:58 PM   Specimen: Urine, Clean Catch  Result Value Ref Range Status   Specimen Description   Final    URINE, CLEAN CATCH Performed at [facility], [place] [address] [place], [place] [address]    Special Requests   Final    NONE Performed at [facility], [place] [address], [place], [place] [address]    Culture   Final    NO GROWTH Performed at [facility], [place] [address], [place], [place] [address]    Report Status 10/11/2021 FINAL  Final    Labs: CBC: Recent Labs  Lab 10/09/21 1551 10/10/21 0911 10/11/21 0353 10/12/21 0400  WBC 12.2* 9.4 6.9 5.2  NEUTROABS  --  7.6 4.2 2.6  HGB 15.2 13.7 13.4 13.7  HCT 42.3 40.1 38.6* 39.7  MCV 93.8 96.9 95.8 96.1  PLT 43* 34* 32* 42*   Basic Metabolic Panel: Recent Labs  Lab 10/09/21 1551 10/10/21 0911 10/11/21 0353 10/12/21 0400  NA 139 142 136 138  K 3.0* 3.2* 3.6 5.1  CL 111 114* 111 112*  CO2 20* 21* 21* 20*  GLUCOSE 116* 116* 97 107*  BUN _0 CREATININE 0.58* 0.67 0.60* 0.46*  CALCIUM 8.8* 8.0* 7.7* 8.0*   Liver Function Tests: Recent Labs  Lab 10/09/21 1551 10/10/21 0911 10/11/21 0353 10/12/21 0400  AST 55* 60* 56* 74*  ALT 38 36 34 31  ALKPHOS 134* 97 86 80  BILITOT 2.2* 1.8* 2.0* 2.1*  PROT 6.5 6.0* 5.6* 5.3*  ALBUMIN 3.4* 3.1* 2.8* 2.7*   CBG: No results for input(s): "GLUCAP" in the last 168 hours.  Discharge time spent: greater than 30 minutes.  Signed: Elmarie Shiley, MD [facility] 10/12/2021

## 2021-10-12 NOTE — Progress Notes (Signed)
[facility] Progress Note  CC:  EtOH induced cirrhosis, acute illness with chills fever headache nausea vomiting and abdominal pain  Subjective: Patient was ambulating up in his room, packing his belongings as he was discharged home by the hospitalist.  Stratus interpreter was utilized to communicate with the patient.  He denies having any nausea or vomiting.  He has very little right and left sided abdominal pain.  Passed a bowel movement yesterday, no rectal bleeding or black stools.  No chest pain or shortness of breath.   Objective:  Vital signs in last 24 hours: Temp:  [98.1 F (36.7 C)-99.3 F (37.4 C)] 98.1 F (36.7 C) (06/15 0928) Pulse Rate:  [65-68] 68 (06/15 0928) Resp:  [15-18] 18 (06/15 0928) BP: (108-126)/(63-79) 108/64 (06/15 0928) SpO2:  [99 %-100 %] 100 % (06/15 0928) Weight:  [54.1 kg] 54.1 kg (06/15 0615) Last BM Date : 10/11/21 General:   Alert, well-developed in NAD Heart: Regular rate and rhythm, no murmurs Pulm: Breath sounds clear throughout. Abdomen: Protuberant, soft.  Nontender. Extremities:  Without edema. Neurologic:  Alert and  oriented x 4. Grossly normal neurologically. Psych:  Alert and cooperative. Normal mood and affect.  Intake/Output from previous day: 06/14 0701 - 06/15 0700 In: 1201 [P.O.:1080; I.V.:5.7; IV Piggyback:115.3] Out: 400 [Urine:400] Intake/Output this shift: No intake/output data recorded.  Lab Results: Recent Labs    10/10/21 0911 10/11/21 0353 10/12/21 0400  WBC 9.4 6.9 5.2  HGB 13.7 13.4 13.7  HCT 40.1 38.6* 39.7  PLT 34* 32* 42*   BMET Recent Labs    10/10/21 0911 10/11/21 0353 10/12/21 0400  NA 142 136 138  K 3.2* 3.6 5.1  CL 114* 111 112*  CO2 21* 21* 20*  GLUCOSE 116* 97 107*  BUN $Re'13 10 9  'rcL$ CREATININE 0.67 0.60* 0.46*  CALCIUM 8.0* 7.7* 8.0*   LFT Recent Labs    10/11/21 0353 10/12/21 0400  PROT 5.6* 5.3*  ALBUMIN 2.8* 2.7*  AST 56* 74*  ALT 34 31  ALKPHOS 86 80  BILITOT  2.0* 2.1*  BILIDIR 0.6*  --   IBILI 1.4*  --    PT/INR Recent Labs    10/11/21 1301  LABPROT 15.8*  INR 1.3*   Hepatitis Panel No results for input(s): "HEPBSAG", "HCVAB", "HEPAIGM", "HEPBIGM" in the last 72 hours.  CT HEAD WO CONTRAST (5MM)  Result Date: 10/11/2021 CLINICAL DATA:  Headache. EXAM: CT HEAD WITHOUT CONTRAST TECHNIQUE: Contiguous axial images were obtained from the base of the skull through the vertex without intravenous contrast. RADIATION DOSE REDUCTION: This exam was performed according to the departmental dose-optimization program which includes automated exposure control, adjustment of the mA and/or kV according to patient size and/or use of iterative reconstruction technique. COMPARISON:  Head CT dated 03/04/2018. FINDINGS: Brain: The ventricles and sulci are appropriate size for the patient's age. The gray-white matter discrimination is preserved. There is no acute intracranial hemorrhage. No mass effect or midline shift. No extra-axial fluid collection. Vascular: No hyperdense vessel or unexpected calcification. Skull: Normal. Negative for fracture or focal lesion. Sinuses/Orbits: There is a 2 cm right maxillary sinus retention cyst or polyp. The visualized paranasal sinuses and mastoid air cells are otherwise clear. Other: None IMPRESSION: No acute intracranial pathology. Electronically Signed   By: Anner Crete M.D.   On: 10/11/2021 19:23   DG Chest 2 View  Result Date: 10/11/2021 CLINICAL DATA:  Fever. Hx of alcoholic cirrhosis of the liver, ascites, HTN, GERD EXAM: CHEST -  2 VIEW COMPARISON:  Chest radiograph June 12, 23. FINDINGS: Borderline enlargement of the cardiac silhouette. Pulmonary vascular congestion. No consolidation. No visible pleural effusions or pneumothorax. No displaced fracture. IMPRESSION: No consolidation. Borderline cardiomegaly and pulmonary vascular congestion. Electronically Signed   By: Margaretha Sheffield M.D.   On: 10/11/2021 08:45   NM  Hepatobiliary Liver Func  Result Date: 10/10/2021 CLINICAL DATA:  Right upper quadrant abdominal pain, with nausea and vomiting. Fever and chills. Cirrhosis. EXAM: NUCLEAR MEDICINE HEPATOBILIARY IMAGING TECHNIQUE: Sequential images of the abdomen were obtained out to 60 minutes following intravenous administration of radiopharmaceutical. RADIOPHARMACEUTICALS:  5.4 mCi Tc-49m  Choletec IV COMPARISON:  None Available. FINDINGS: Prompt uptake and biliary excretion of activity by the liver is seen. Gallbladder activity is visualized, consistent with patency of cystic duct. Biliary activity passes into small bowel, consistent with patent common bile duct. A small amount of biliary activity refluxes into the stomach. IMPRESSION: No evidence of acute cholecystitis or biliary obstruction. Mild bile reflux noted. Electronically Signed   By: Marlaine Hind M.D.   On: 10/10/2021 12:11    Assessment / Plan:  43) 41 year old Hispanic male with alcohol induced cirrhosis, recompensated after complete discontinuation of EtOH use 2020.  He has been stable with improvement in parameters and most recent MELD of 11. T. Bili 2.1. Alk phos 80. AST 56 -> 74. ALT 31.  INR 1.4 -> 1.3. EGD May 2023 no varices. Ultrasound  showed a small right hepatic cyst, no ascites. Chronic thrombocytopenia.  Platelet count 42. -Follow-up with [facility]  -Maintain a 2 gm low-sodium diet  2) History of SBP 2019, no longer on prophylactic antibiotics   3) Acute illness onset 48 hours ago with headache, fever chills nausea vomiting and generalized abdominal discomfort. Work-up thus far has been negative-negative respiratory panel, chest x-ray, ultrasound and HIDA scan both negative. Suspect this may be an acute viral syndrome. Patient's hepatic parameters are at his baseline. No evidence of hepatic decompensation related to this acute illness.      Principal Problem:   Abdominal pain Active Problems:   Thrombocytopenia (HCC)    Alcoholic cirrhosis (HCC)   Hypokalemia   Alcohol use disorder, severe, in sustained remission ([place])   Fever     LOS: 2 days   Jason [name]  10/12/2021, 10:22 AM

## 2021-10-12 NOTE — Progress Notes (Signed)
Discharge instructions reviewed with patient/interpreter.Patient verbalizes understanding. All questions were answered to pt's satisfaction.

## 2021-10-12 NOTE — Plan of Care (Signed)
?  Problem: Activity: ?Goal: Risk for activity intolerance will decrease ?Outcome: Progressing ?  ?Problem: Safety: ?Goal: Ability to remain free from injury will improve ?Outcome: Progressing ?  ?Problem: Pain Managment: ?Goal: General experience of comfort will improve ?Outcome: Progressing ?  ?

## 2021-10-12 NOTE — TOC Transition Note (Signed)
Transition of Care [facility]) - CM/SW Discharge Note  Patient Details  Name: Martin Belling MRN: [id number] Date of Birth: 10-13-1980  [facility] CM/SW Contact:  Ewing Schlein, LCSW Phone Number: 10/12/2021, 10:04 AM  Clinical Narrative: [facility] consulted for PCP assistance, but he is already being seen at [facility]. Patient to schedule his own hospital follow up appointment.  Final next level of care: Home/Self Care Barriers to Discharge: Barriers Resolved  Patient Goals and CMS Choice Choice offered to / list presented to : NA  Discharge Plan and Services         DME Arranged: N/A DME Agency: NA  Readmission Risk Interventions     No data to display

## 2021-10-13 ENCOUNTER — Telehealth: Payer: Self-pay

## 2021-10-13 NOTE — Telephone Encounter (Signed)
Transition Care Management Follow-up Telephone Call   Date of discharge and from where: [facility] on 10/12/2021 How have you been since you were released from the hospital? Doing good  Any questions or concerns? No questions/concerns reported.  Items Reviewed: Did the pt receive and understand the discharge instructions provided? Has the instructions and have no questions.  Medications obtained and verified? He said that he have the medication list  and the hospital staff reviewed them in detail prior to discharge.  Any new allergies since your discharge? None reported  Do you have support at home? Yes, daughter and mother  Other (ie: DME, Home Health, etc)       none  Functional Questionnaire: (I = Independent and D = Dependent) ADL's:  Independent.        Follow up appointments reviewed:   PCP Hospital f/u appt confirmed? Dr Rossie Muskrat on 10/17/2021 Specialist Hospital f/u appt confirmed? None scheduled at this time  Are transportation arrangements needed? have transportation   If their condition worsens, is the pt aware to call  their PCP or go to the ED? Yes  Was the patient provided with contact information for the PCP's office or ED? He has the phone number  Was the pt encouraged to call back with questions or concerns?yes

## 2021-10-14 LAB — CULTURE, BLOOD (ROUTINE X 2)
Culture: NO GROWTH
Special Requests: ADEQUATE

## 2021-10-15 LAB — CULTURE, BLOOD (ROUTINE X 2): Culture: NO GROWTH

## 2021-10-17 ENCOUNTER — Encounter: Payer: Self-pay | Admitting: Physician Assistant

## 2021-10-17 ENCOUNTER — Ambulatory Visit: Payer: 59 | Admitting: Physician Assistant

## 2021-10-17 ENCOUNTER — Other Ambulatory Visit: Payer: Self-pay

## 2021-10-17 ENCOUNTER — Inpatient Hospital Stay: Payer: 59 | Admitting: Family Medicine

## 2021-10-17 VITALS — BP 133/79 | HR 84 | Resp 18 | Ht 65.0 in | Wt 247.0 lb

## 2021-10-17 DIAGNOSIS — G44209 Tension-type headache, unspecified, not intractable: Secondary | ICD-10-CM

## 2021-10-17 DIAGNOSIS — M79605 Pain in left leg: Secondary | ICD-10-CM | POA: Diagnosis not present

## 2021-10-17 DIAGNOSIS — K7030 Alcoholic cirrhosis of liver without ascites: Secondary | ICD-10-CM

## 2021-10-17 DIAGNOSIS — E876 Hypokalemia: Secondary | ICD-10-CM | POA: Diagnosis not present

## 2021-10-17 DIAGNOSIS — R197 Diarrhea, unspecified: Secondary | ICD-10-CM | POA: Diagnosis not present

## 2021-10-17 MED ORDER — GABAPENTIN 100 MG PO CAPS
100.0000 mg | ORAL_CAPSULE | Freq: Three times a day (TID) | ORAL | 0 refills | Status: DC
  Filled 2021-10-17: qty 90, 30d supply, fill #0

## 2021-10-17 NOTE — Patient Instructions (Signed)
You are going to start taking gabapentin to help with your leg pain.  You will start with 100 mg at bedtime and you may increase to 3 times a day if this is not offering relief.  We will call you with today's lab results.  Please let us know if your episodes of diarrhea do not resolve in the next couple of days.  Roney Jaffe, PA-C Physician Assistant [facility] [email or web address]   Diarrea en los adultos Diarrhea, Adult La diarrea consiste en deposiciones frecuentes, blandas o acuosas. La diarrea puede hacerlo sentir dbil y deshidratarlo. La deshidratacin puede causarle cansancio, sed, sequedad en la boca y disminucin en la frecuencia con la que orina. Generalmente, la diarrea dura entre 2 y [address]. Sin embargo, puede durar ms tiempo si se trata de un signo de algo ms serio. Es importante tratar la diarrea como se lo haya indicado el mdico. Siga estas instrucciones en su casa: Comida y bebida     Siga estas recomendaciones como se lo haya indicado el mdico: Tome una solucin de rehidratacin oral (SRO). Es un medicamento de venta libre que ayuda a que el cuerpo recupere el equilibrio normal de nutrientes y Sports coach. Se la encuentra en farmacias y tiendas minoristas. Beba abundantes lquidos, como agua, trocitos de hielo, jugos de fruta rebajados con agua y bebidas deportivas bajas en caloras. Puede beber [employer], si lo desea. Evite consumir lquidos que contengan mucha azcar o cafena, como bebidas energticas, bebidas deportivas y refrescos. En la medida en que pueda, consuma alimentos blandos y fciles de digerir en pequeas cantidades. Estos alimentos incluyen bananas, compota de [place], arroz, carnes [place], tostadas y [address]. Evite tomar alcohol. Evite los alimentos condimentados o con alto contenido de [place].  Medicamentos Use los medicamentos de venta libre y los recetados solamente como se lo haya  indicado el mdico. Si le recetaron un antibitico, tmelo como se lo haya indicado el mdico. No deje de usar el antibitico aunque comience a Actor. Instrucciones generales  Lvese las manos frecuentemente usando agua y [place]. Use desinfectante para manos si no dispone de [place] y [place]. Las [employer]aur[employer] de la casa deben lavarse las manos tambin. Las manos deben lavarse: Despus de usar el bao o cambiar un paal. Antes de preparar, cocinar o servir la comida. Mientras cuida de una persona enferma, o cuando visita a alguien en el hospital. Beba suficiente lquido como para mantener la orina de color amarillo plido. Descanse en su casa mientras se recupera. Controle su afeccin para Insurance risk surveyor cambio. Tome un bao caliente para ayudar a Engineer, manufacturing systems ardor o el dolor causados por los episodios frecuentes de diarrea. Concurra a todas las visitas de [address] se lo haya indicado el mdico. Esto es importante. Comunquese con un mdico si: Tiene fiebre. La diarrea empeora. Aparecen nuevos sntomas. No puede retener los lquidos. Se siente aturdido o mareado. Tiene dolor de [place]. Tiene calambres musculares. Solicite ayuda de inmediato si: Midwife. Se siente muy dbil o se desmaya. Tiene heces con sangre, de color negro, o con aspecto alquitranado. Tiene dolor intenso, clicos o distensin abdominal. Tiene problemas para respirar o respira muy rpidamente. Su corazn late muy rpidamente. Siente la piel fra y hmeda. Se siente confundido. Tiene signos de deshidratacin, como los siguientes: Orina de color oscuro, muy escasa o falta de [place]. Labios agrietados. Sequedad de boca. Ojos hundidos. Somnolencia. Debilidad. Resumen La diarrea consiste en deposiciones frecuentes, blandas y, a  veces, acuosas. La diarrea puede hacerlo sentir dbil y deshidratarlo. Beba suficiente lquido para Consulting civil engineer orina de color amarillo plido. Asegrese  de lavarse las manos despus de usar el bao. Use desinfectante para manos si no dispone de [place] y [place]. Comunquese con un mdico si la diarrea empeora o tiene nuevos sntomas. Busque ayuda de inmediato si tiene signos de deshidratacin. Esta informacin no tiene Marine scientist el consejo del mdico. Asegrese de hacerle al mdico cualquier pregunta que tenga. Document Revised: 11/18/2020 Document Reviewed: 11/18/2020 Elsevier Patient Education  [place] neuroptico Neuropathic Pain El dolor neuroptico se produce cuando hay dao en los nervios responsables de ciertas sensaciones del cuerpo (nervios sensitivos). El dolor neuroptico puede volverlo ms sensible al [employer]. An una sensacin leve puede sentirse Writer. Por lo general, es una enfermedad a largo plazo (crnica) que puede ser difcil de tratar. El tipo de dolor difiere de Ardelia Mems persona a Theatre manager. Es posible que: Comience en forma repentina (agudo) o se desarrolle lentamente y se vuelva crnico. Aparezca y desaparezca a medida que los nervios daados se curan, o permanezca estable durante aos. [place], prdida de sueo y [place] calidad de vida deficiente. Cules son las causas? La causa ms frecuente de esta afeccin es la diabetes. El dolor neuroptico tambin puede producirse por muchas otras enfermedades y afecciones. Las causas del dolor neuroptico pueden clasificarse como: Txicas. Esto es causado por medicamentos y sustancias qumicas. Las causas ms frecuentes del dolor neuroptico por exposicin a sustancias txicas son el dao causado por los medicamentos que eliminan las clulas cancerosas (quimioterapia) o el consumo excesivo de alcohol. Metablicas. Esto puede ser consecuencia de: Diabetes. Falta de vitaminas como la B12. Traumticas. Las lesiones que cortan, presionan o estiran un nervio pueden producir dao y Social research officer, government. Causas relacionadas con la compresin. Si un nervio sensitivo queda  atrapado o comprimido por [employer], el suministro de sangre al nervio puede interrumpirse. Vasculares. Muchas enfermedades de los vasos sanguneos pueden producir dolor neuroptico al reducir el suministro de [place] y el oxgeno que Lucianne Lei a los nervios. Autoinmunes. Este tipo de Social research officer, government se produce por las [employer] el sistema de defensa del cuerpo (sistema inmunitario) ataca por error los nervios sensitivos. Entre los ejemplos de enfermedades autoinmunes que pueden causar dolor neuroptico se incluyen el lupus y Curator. Infecciosas. Muchos tipos de infecciones virales pueden daar los nervios sensitivos y Engineer, drilling. La infeccin por culebrilla (virus del herpes zster) es una causa comn de este tipo de Social research officer, government. Heredadas. El dolor neuroptico puede ser un sntoma de muchas enfermedades que se transmiten entre los miembros de las familias (genticas). Qu incrementa el riesgo? Es ms probable que sufra esta afeccin si: Tiene diabetes. Fuma. Bebe alcohol en exceso. Toma determinados medicamentos, incluida la quimioterapia o medicamentos que se utilizan para tratar trastornos del sistema inmunitario. Cules son los signos o sntomas? El sntoma principal es Conservation officer, historic buildings. El dolor neuroptico a menudo se describe como: Consulting civil engineer. Similar a un choque. Esc[place]or. Fro o calor. Picazn. Cmo se diagnostica? No hay un estudio que pueda diagnosticar el dolor neuroptico. Se diagnostica en funcin de lo siguiente: Un examen fsico y los sntomas. El mdico le har preguntas acerca de su dolor. Pueden solicitarle que use una escala de dolor para describir la intensidad del dolor. Estudios. Estos pueden realizarse para Animator causa y la ubicacin de un posible dao nervioso. Incluyen los siguientes: Estudios de conduccin nerviosa y electromiografa para Chief of Staff  si las seales nerviosas pasan por los nervios y los msculos en forma correcta o no (estudios  electrodiagnsticos). Biopsia cutnea para evaluar si presenta neuropata de fibras pequeas. Estudios de diagnstico por imgenes, por ejemplo: Radiografas. Exploracin por tomografa computarizada (TC). Resonancia magntica (RM). Cmo se trata? El tratamiento para el dolor neuroptico puede cambiar con el [place]. Es posible que necesite probar distintas opciones de tratamientos o una combinacin de tratamientos. Entre las opciones se incluyen las siguientes: Tratamiento de la causa preexistente de la neuropata, como la diabetes, una enfermedad renal o la deficiencia de vitaminas. Interrupcin de los [employer] pueden causar la neuropata, como la quimioterapia. Medicamentos para Engineer, materials. Los medicamentos pueden incluir los siguientes: Analgsicos de venta libre o recetados. Anticonvulsivos. Antidepresivos. Cremas o parches analgsicos que se colocan en las zonas de la piel que le duelen. Un medicamento para adormecer el rea (anestesia local), que puede inyectarse como un bloqueo nervioso. Neuroestimulacin transcutnea. En este tratamiento se utilizan corrientes elctricas para bloquear las seales nerviosas dolorosas. El tratamiento es indoloro. Tratamientos alternativos, como: Acupuntura. Meditacin. Masajes. Terapia ocupacional o fisioterapia. Programas para el control del dolor. Asesoramiento psicolgico. Siga estas indicaciones en su casa: Medicamentos  Use los medicamentos de venta libre y los recetados solamente como se lo haya indicado el mdico. Pregntele al mdico si el medicamento recetado: Requiere que evite conducir o usar [place]. Puede causarle estreimiento. Es posible que tenga que tomar estas medidas para prevenir o tratar el estreimiento: Product manager suficiente lquido como para Pharmacologist la orina de color amarillo plido. Usar medicamentos recetados o de Sales promotion account executive. Consumir alimentos ricos en fibra, como frijoles, cereales integrales, y frutas  y verduras frescas. Limitar el consumo de alimentos ricos en grasa y azcares procesados, como los alimentos fritos o dulces. Estilo de vida  Tenga un buen sistema de Immunologist. Considere la opcin de unirse a un grupo de apoyo para Chief Technology Officer crnico. No consuma ningn producto que contenga nicotina o tabaco. Estos productos incluyen cigarrillos, tabaco para Theatre manager y aparatos de vapeo, como los Administrator, Civil Service. Si necesita ayuda para dejar de consumir estos productos, consulte al mdico. No beba alcohol. Indicaciones generales Infrmese todo lo que pueda sobre su afeccin. Trabaje en estrecha colaboracin con todos sus mdicos para hallar el tratamiento ms adecuado para usted. Pregntele al mdico qu actividades son seguras para usted. Concurra a todas las visitas de seguimiento. Esto es importante. Comunquese con un mdico si: Sus tratamientos para el dolor no funcionan. Los [employer] causan [employer]. Est lidiando con sntomas de cansancio (fatiga), cambios en el estado de nimo, depresin o ansiedad. Solicite ayuda de inmediato si: Tiene pensamientos acerca de Runner, broadcasting/film/video. Busque ayuda de inmediato si alguna vez siente que puede [address] a usted [place] o a otros, o tiene pensamientos de Patent examiner a su vida. Dirjase al centro de urgencias ms cercano o: Llame al 911. Llame a National Suicide Prevention Lifeline (Lnea Telefnica Nacional para la Prevencin del Suicidio) al [phone] o al [phone]. Est disponible las 24 horas del da. Enve un mensaje de texto a la lnea para casos de crisis al [phone]. Resumen El dolor neuroptico se produce cuando hay dao en los nervios responsables de ciertas sensaciones del cuerpo (nervios sensitivos). El dolor neuroptico puede aparecer y Geneticist, molecular a medida que los nervios daados se curan, o puede permanecer estable durante aos. Por lo general, el dolor neuroptico es una enfermedad a largo plazo que puede ser  difcil de Warehouse manager. Considere la  opcin de unirse a un grupo de apoyo para Conservation officer, historic buildings crnico. Esta informacin no tiene Marine scientist el consejo del mdico. Asegrese de hacerle al mdico cualquier pregunta que tenga. Document Revised: 12/30/2020 Document Reviewed: 12/30/2020 Elsevier Patient Education  [place].
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
## 2021-10-18 ENCOUNTER — Other Ambulatory Visit: Payer: Self-pay

## 2021-10-18 LAB — BASIC METABOLIC PANEL WITH GFR
BUN/Creatinine Ratio: 14 (ref 9–20)
BUN: 9 mg/dL (ref 6–20)
CO2: 17 mmol/L — ABNORMAL LOW (ref 20–29)
Calcium: 8.6 mg/dL — ABNORMAL LOW (ref 8.7–10.2)
Chloride: 107 mmol/L — ABNORMAL HIGH (ref 96–106)
Creatinine, Ser: 0.65 mg/dL — ABNORMAL LOW (ref 0.76–1.27)
Glucose: 90 mg/dL (ref 70–99)
Potassium: 3.2 mmol/L — ABNORMAL LOW (ref 3.5–5.2)
Sodium: 138 mmol/L (ref 134–144)
eGFR: 124 mL/min/1.73

## 2021-10-18 MED ORDER — POTASSIUM CHLORIDE ER 20 MEQ PO TBCR
20.0000 meq | EXTENDED_RELEASE_TABLET | Freq: Every day | ORAL | 0 refills | Status: DC
  Filled 2021-10-18: qty 10, 10d supply, fill #0

## 2021-10-18 NOTE — Addendum Note (Signed)
Addended by: Roney Jaffe on: 10/18/2021 07:57 AM   Modules accepted: Orders

## 2021-10-19 ENCOUNTER — Other Ambulatory Visit: Payer: Self-pay

## 2021-10-30 ENCOUNTER — Ambulatory Visit: Payer: 59 | Attending: Family Medicine | Admitting: Nurse Practitioner

## 2021-10-30 ENCOUNTER — Other Ambulatory Visit: Payer: Self-pay

## 2021-10-30 ENCOUNTER — Encounter: Payer: Self-pay | Admitting: Nurse Practitioner

## 2021-10-30 VITALS — BP 127/84 | HR 71 | Temp 98.4°F | Ht 65.0 in | Wt 252.8 lb

## 2021-10-30 DIAGNOSIS — H7291 Unspecified perforation of tympanic membrane, right ear: Secondary | ICD-10-CM

## 2021-10-30 DIAGNOSIS — E876 Hypokalemia: Secondary | ICD-10-CM | POA: Diagnosis not present

## 2021-10-30 DIAGNOSIS — Z0100 Encounter for examination of eyes and vision without abnormal findings: Secondary | ICD-10-CM

## 2021-10-30 DIAGNOSIS — I272 Pulmonary hypertension, unspecified: Secondary | ICD-10-CM | POA: Diagnosis not present

## 2021-10-30 DIAGNOSIS — G5792 Unspecified mononeuropathy of left lower limb: Secondary | ICD-10-CM

## 2021-10-30 MED ORDER — GABAPENTIN 300 MG PO CAPS
300.0000 mg | ORAL_CAPSULE | Freq: Three times a day (TID) | ORAL | 1 refills | Status: DC
  Filled 2021-10-30: qty 90, 30d supply, fill #0
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
## 2021-10-31 LAB — CMP14+EGFR
ALT: 32 IU/L (ref 0–44)
AST: 51 IU/L — ABNORMAL HIGH (ref 0–40)
Albumin/Globulin Ratio: 1.2 (ref 1.2–2.2)
Albumin: 3.6 g/dL — ABNORMAL LOW (ref 4.0–5.0)
Alkaline Phosphatase: 163 IU/L — ABNORMAL HIGH (ref 44–121)
BUN/Creatinine Ratio: 10 (ref 9–20)
BUN: 6 mg/dL (ref 6–20)
Bilirubin Total: 1.6 mg/dL — ABNORMAL HIGH (ref 0.0–1.2)
CO2: 20 mmol/L (ref 20–29)
Calcium: 8.5 mg/dL — ABNORMAL LOW (ref 8.7–10.2)
Chloride: 109 mmol/L — ABNORMAL HIGH (ref 96–106)
Creatinine, Ser: 0.63 mg/dL — ABNORMAL LOW (ref 0.76–1.27)
Globulin, Total: 3 g/dL (ref 1.5–4.5)
Glucose: 119 mg/dL — ABNORMAL HIGH (ref 70–99)
Potassium: 3.5 mmol/L (ref 3.5–5.2)
Sodium: 141 mmol/L (ref 134–144)
Total Protein: 6.6 g/dL (ref 6.0–8.5)
eGFR: 125 mL/min/{1.73_m2} (ref >59)

## 2021-11-17 ENCOUNTER — Encounter: Payer: Self-pay | Admitting: Internal Medicine

## 2021-11-17 ENCOUNTER — Other Ambulatory Visit: Payer: Self-pay

## 2021-11-17 ENCOUNTER — Ambulatory Visit: Payer: 59 | Attending: Internal Medicine | Admitting: Internal Medicine

## 2021-11-17 VITALS — BP 130/80 | HR 63 | Temp 98.5°F | Ht 65.0 in | Wt 255.6 lb

## 2021-11-17 DIAGNOSIS — K7030 Alcoholic cirrhosis of liver without ascites: Secondary | ICD-10-CM

## 2021-11-17 DIAGNOSIS — E876 Hypokalemia: Secondary | ICD-10-CM

## 2021-11-17 DIAGNOSIS — G5792 Unspecified mononeuropathy of left lower limb: Secondary | ICD-10-CM | POA: Diagnosis not present

## 2021-11-17 DIAGNOSIS — H7291 Unspecified perforation of tympanic membrane, right ear: Secondary | ICD-10-CM | POA: Diagnosis not present

## 2021-11-17 DIAGNOSIS — H11001 Unspecified pterygium of right eye: Secondary | ICD-10-CM

## 2021-11-17 MED ORDER — FUROSEMIDE 40 MG PO TABS
40.0000 mg | ORAL_TABLET | Freq: Every day | ORAL | 1 refills | Status: DC
  Filled 2021-11-17: qty 90, 90d supply, fill #0

## 2021-11-17 MED ORDER — POTASSIUM CHLORIDE ER 20 MEQ PO TBCR
20.0000 meq | EXTENDED_RELEASE_TABLET | Freq: Every day | ORAL | 1 refills | Status: DC
  Filled 2021-11-17: qty 90, 90d supply, fill #0

## 2021-11-17 NOTE — Progress Notes (Signed)
Patient ID: Jason [name], male    DOB: 05-14-1980  MRN: [id number]  CC: Leg Pain   Subjective: Jason [name] is a 41 y.o. male who presents for chronic ds management His concerns today include:  Patient with history of severe ETOH use disorder in remission, alcohol induced cirrhosis, SBP, varices  Hosp last mth for abdominal pain and LLE cellulitis Saw PA and NP post hosp c/o pain and numbness LT thigh.  Started on Gabapentin which helps with pain Pain and numbness over areas of leg where he had skin graft in 2014 - lateral thigh and over shin.  Symptoms started during most recent hosp for cellulitis. Numbness is constant and pain is intermittent lasting 5 mins and may not occur again in 2 wks.   -Endorses some intermittent lower back pain without radiation which has been occurring much longer than the numbness/pain in the leg  C/o blood coming out of RT ear during hosp. Seen by NP 10/30/2021 and assessed to have tympanic membrane rupture.  Referred to ENT. Referred to Dr. Suszanne Conners but he does not accept his insurance plan  Complains of noticing a fleshy growth on the medial aspect of the right eyeball.  Reports some blurred vision in both eyes with distance.  Cirrhosis: Seen by [facility] liver clinic.  MELD score has improved.  Not on transplant list.  Spironolactone was discontinued after recent hospitalization.  Currently on furosemide and potassium.  Needs refill on the potassium.  Does not like the taste of lactulose but continues to take it twice a day.  He has about 3-4 soft to loose stools a day.  He continues to remain tobacco free.   Patient Active Problem List   Diagnosis Date Noted   Abdominal pain 10/10/2021   Fever 10/10/2021   Pulmonary hypertension (HCC) 09/18/2021   Gastritis and gastroduodenitis    Hematuria 04/25/2021   Gynecomastia 04/25/2021   Hypertension 09/13/2020   Portal hypertensive gastropathy (HCC)    Esophageal varices without bleeding  (HCC)    Former smoker 06/10/2018   Alcoholic cirrhosis (HCC) 05/01/2018   Macrocytic anemia 05/01/2018   Hypokalemia 05/01/2018   Liver lesion, right lobe 05/01/2018   Liver failure (HCC) 05/01/2018   Liver failure without hepatic coma (HCC)    Alcohol use disorder, severe, in sustained remission (HCC)    Thrombocytopenia (HCC) 03/04/2018     Current Outpatient Medications on File Prior to Visit  Medication Sig Dispense Refill   bismuth subsalicylate (PEPTO BISMOL) 262 MG chewable tablet Chew 524 mg by mouth as needed for indigestion or diarrhea or loose stools.     famotidine (PEPCID) 20 MG tablet Take 1 tablet (20 mg total) by mouth daily. 90 tablet 3   folic acid (FOLVITE) 1 MG tablet Take 1 tablet (1 mg total) by mouth daily. 30 tablet 2   furosemide (LASIX) 40 MG tablet Take 1 tablet (40 mg total) by mouth daily. 90 tablet 1   gabapentin (NEURONTIN) 300 MG capsule Take 1 capsule (300 mg total) by mouth 3 (three) times daily. 90 capsule 1   lactulose (CHRONULAC) 10 GM/15ML solution TAKE 30 MLS BY MOUTH 2 TIMES DAILY (Patient taking differently: Take 20 g by mouth 2 (two) times daily.) 1892 mL 1   Multiple Vitamin (MULTIVITAMIN WITH MINERALS) TABS tablet Take 1 tablet by mouth daily. 30 tablet 2   pantoprazole (PROTONIX) 40 MG tablet Take 1 tablet (40 mg total) by mouth daily. 30 tablet 0   thiamine  100 MG tablet Take 1 tablet (100 mg total) by mouth daily. 30 tablet 5   Potassium Chloride ER 20 MEQ TBCR Take 1 tablet (20 mEq) by mouth once daily for 10 days. 10 tablet 0   No current facility-administered medications on file prior to visit.    Allergies  Allergen Reactions   Acetaminophen Other (See Comments)    Cirrhosis of the liver    Social History   Socioeconomic History   Marital status: Legally Separated    Spouse name: Not on file   Number of children: Not on file   Years of education: Not on file   Highest education level: Not on file  Occupational History    Occupation: Education administrator Conservation officer, historic buildings)  Tobacco Use   Smoking status: Former   Smokeless tobacco: Never   Tobacco comments:    said he has tried it  Building services engineer Use: Never used  Substance and Sexual Activity   Alcohol use: Not Currently    Comment: 03/05/2018   Drug use: Never   Sexual activity: Yes  Other Topics Concern   Not on file  Social History Narrative   ** Merged History Encounter **       Lives in [place] with wife and daughter.    Social Determinants of Health   Financial Resource Strain: Not on file  Food Insecurity: Not on file  Transportation Needs: Not on file  Physical Activity: Not on file  Stress: Not on file  Social Connections: Not on file  Intimate Partner Violence: Not on file    Family History  Problem Relation Age of Onset   Colon cancer Neg Hx    Esophageal cancer Neg Hx    Healthy Mother    Healthy Daughter     Past Surgical History:  Procedure Laterality Date   BIOPSY  08/10/2019   Procedure: BIOPSY;  Surgeon: Shellia Cleverly, DO;  Location: [facility];  Service: Gastroenterology;;   BIOPSY  02/08/2020   Procedure: BIOPSY;  Surgeon: Shellia Cleverly, DO;  Location: [facility];  Service: Gastroenterology;;   BIOPSY  09/11/2021   Procedure: BIOPSY;  Surgeon: Shellia Cleverly, DO;  [facility]: [facility];  Service: Gastroenterology;;   ESOPHAGEAL BANDING  08/10/2019   Procedure: ESOPHAGEAL BANDING;  Surgeon: Shellia Cleverly, DO;  Location: [facility];  Service: Gastroenterology;;   ESOPHAGOGASTRODUODENOSCOPY (EGD) WITH PROPOFOL N/A 08/10/2019   Procedure: ESOPHAGOGASTRODUODENOSCOPY (EGD) WITH PROPOFOL;  Surgeon: Shellia Cleverly, DO;  [facility]: [facility];  Service: Gastroenterology;  Laterality: N/A;   ESOPHAGOGASTRODUODENOSCOPY (EGD) WITH PROPOFOL N/A 02/08/2020   Procedure: ESOPHAGOGASTRODUODENOSCOPY (EGD) WITH PROPOFOL;  Surgeon: Shellia Cleverly, DO;  Location: [facility];  Service: Gastroenterology;   Laterality: N/A;   ESOPHAGOGASTRODUODENOSCOPY (EGD) WITH PROPOFOL N/A 09/11/2021   Procedure: ESOPHAGOGASTRODUODENOSCOPY (EGD) WITH PROPOFOL;  Surgeon: Shellia Cleverly, DO;  [facility]: [facility];  Service: Gastroenterology;  Laterality: N/A;   FOOT SURGERY Left    around 2014. a scar on lower leg   ORIF ANKLE FRACTURE Right 06/09/2019   Procedure: RIGHT OPEN REDUCTION INTERNAL FIXATION (ORIF) ANKLE FRACTURE;  Surgeon: Sheral Apley, MD;  Location: [facility];  Service: Orthopedics;  Laterality: Right;    ROS: Review of Systems Negative except as stated above  PHYSICAL EXAM: BP 130/80   Pulse 63   Temp 98.5 F (36.9 C) (Oral)   Ht 5\' 5"  (1.651 m)   Wt 255 lb 9.6 oz (115.9 kg)   SpO2 98%  BMI 42.53 kg/m   Physical Exam  General appearance - alert, well appearing, obese Hispanic male and in no distress Mental status - normal mood, behavior, speech, dress, motor activity, and thought processes Neck - supple, no significant adenopathy Eye: Noted to have pterygium in the medial aspect of the right eye Ears: Dried blood noted in the right ear canal with ruptured tympanic membrane. Chest - clear to auscultation, no wheezes, rales or rhonchi, symmetric air entry Heart - normal rate, regular rhythm, normal S1, S2, no murmurs, rubs, clicks or gallops Extremities -no lower extremity edema Skin -scarring from skin graft noted over the left shin and left lateral thigh.      Latest Ref Rng & Units 10/30/2021    9:11 AM 10/17/2021    3:30 PM 10/12/2021    4:00 AM  CMP  Glucose 70 - 99 mg/dL 488  90  891   BUN 6 - 20 mg/dL 6  9  9    Creatinine 0.76 - 1.27 mg/dL  6.94  5.03   Sodium 134 - 144 mmol/L 141  138  138   Potassium 3.5 - 5.2 mmol/L 3.5  3.2  5.1   Chloride 96 - 106 mmol/L 109  107  112   CO2 20 - 29 mmol/L 20  17  20    Calcium 8.7 - 10.2 mg/dL 8.5  8.6  8.0   Total Protein 6.0 - 8.5 g/dL 6.6   5.3   Total Bilirubin 0.0 - 1.2 mg/dL 1.6   2.1    Alkaline Phos 44 - 121 IU/L 163   80   AST 0 - 40 IU/L 51   74   ALT 0 - 44 IU/L 32   31    Lipid Panel     Component Value Date/Time   CHOL 150 06/16/2020 1647   TRIG 65 06/16/2020 1647   HDL 85 06/16/2020 1647   CHOLHDL 1.8 06/16/2020 1647   LDLCALC 52 06/16/2020 1647    CBC    Component Value Date/Time   WBC 5.2 10/12/2021 0400   RBC 4.13 (L) 10/12/2021 0400   HGB 13.7 10/12/2021 0400   HGB 14.9 06/16/2020 1647   HCT 39.7 10/12/2021 0400   HCT 42.6 06/16/2020 1647   PLT 42 (L) 10/12/2021 0400   PLT 44 (LL) 06/16/2020 1647   MCV 96.1 10/12/2021 0400   MCV 95 06/16/2020 1647   MCH 33.2 10/12/2021 0400   MCHC 34.5 10/12/2021 0400   RDW 14.4 10/12/2021 0400   RDW 13.9 06/16/2020 1647   LYMPHSABS 1.7 10/12/2021 0400   LYMPHSABS 1.6 05/07/2019 1413   MONOABS 0.7 10/12/2021 0400   EOSABS 0.2 10/12/2021 0400   EOSABS 0.1 05/07/2019 1413   BASOSABS 0.0 10/12/2021 0400   BASOSABS 0.0 05/07/2019 1413    ASSESSMENT AND PLAN: 1. Neuropathy of left lower extremity Patient reports significant improvement with pain but still having some numbness.  I do not want to push the dose of the gabapentin any further.  He is satisfied with the results that he has had so far with gabapentin 300 mg 3 times a day.  2. Alcoholic cirrhosis of liver without ascites (HCC) Doing well.  Not on transplant list.  Encouraged him to continue to remain free of alcohol. - furosemide (LASIX) 40 MG tablet; Take 1 tablet (40 mg total) by mouth daily.  Dispense: 90 tablet; Refill: 1  3. Ruptured tympanic membrane, right Message sent to the referral coordinator requesting that his referral be  rerouted to another ear nose and throat specialist who takes his insurance  4. Hypokalemia - Potassium Chloride ER 20 MEQ TBCR; Take 1 tablet  ( 20 mEq)  by mouth daily.  Dispense: 90 tablet; Refill: 1  5. Pterygium of right eye - Ambulatory referral to Ophthalmology    [employer] Language interpreter used during  this encounter. #[id number], Miriam  Patient was given the opportunity to ask questions.  Patient verbalized understanding of the plan and was able to repeat key elements of the plan.   This documentation was completed using Paediatric nurse.  Any transcriptional errors are unintentional.  No orders of the defined types were placed in this encounter.    Requested Prescriptions    No prescriptions requested or ordered in this encounter    No follow-ups on file.  Jonah Blue, MD, FACP

## 2021-11-17 NOTE — Progress Notes (Signed)
Having pain in left leg Pain in right ear and right eye.

## 2021-11-20 ENCOUNTER — Other Ambulatory Visit: Payer: Self-pay

## 2021-11-20 ENCOUNTER — Other Ambulatory Visit: Payer: Self-pay | Admitting: Internal Medicine

## 2021-11-20 ENCOUNTER — Telehealth: Payer: Self-pay | Admitting: Internal Medicine

## 2021-11-20 MED ORDER — PANTOPRAZOLE SODIUM 40 MG PO TBEC
40.0000 mg | DELAYED_RELEASE_TABLET | Freq: Every day | ORAL | 2 refills | Status: DC
  Filled 2021-11-20: qty 30, 30d supply, fill #0

## 2021-11-20 NOTE — Telephone Encounter (Signed)
-----   Message from Dionne Bucy sent at 11/20/2021  4:54 PM EDT ----- Regarding: Ent  Referral Greetings  Dr  Laural Benes  The only place that accept  Friday  is  [facility]   Sent referral to [facility]  [address] [place], [place] [address] [phone] FAX [phone]   ----- Message ----- From: Marcine Matar, MD Sent: 11/17/2021  12:45 PM EDT To: Dionne Bucy  His ENT referral needs to be rerouted to a specialist who takes his insurance.  Dr. Suszanne Conners does not.

## 2021-11-21 ENCOUNTER — Other Ambulatory Visit: Payer: Self-pay

## 2021-11-21 MED ORDER — NEOMYCIN-POLYMYXIN-DEXAMETH 3.5-10000-0.1 OP SUSP
OPHTHALMIC | 3 refills | Status: DC
  Filled 2021-11-21: qty 5, 14d supply, fill #0
  Filled 2022-02-14: qty 5, 14d supply, fill #1

## 2022-01-03 ENCOUNTER — Other Ambulatory Visit: Payer: Self-pay | Admitting: Internal Medicine

## 2022-01-03 DIAGNOSIS — K7030 Alcoholic cirrhosis of liver without ascites: Secondary | ICD-10-CM

## 2022-01-03 DIAGNOSIS — E876 Hypokalemia: Secondary | ICD-10-CM

## 2022-01-03 DIAGNOSIS — G5792 Unspecified mononeuropathy of left lower limb: Secondary | ICD-10-CM

## 2022-01-03 NOTE — Telephone Encounter (Signed)
Medication Refill - Medication:  pantoprazole (PROTONIX) 40 MG tablet gabapentin (NEURONTIN) 300 MG capsule lactulose (CHRONULAC) 10 GM/15ML solution famotidine (PEPCID) 20 MG tablet furosemide (LASIX) 40 MG tablet Potassium Chloride ER 20 MEQ TBCR  Has the patient contacted their pharmacy? No.   Preferred Pharmacy (with phone number or street name):  [facility] Phone:  [phone]  Fax:  [phone]     Has the patient been seen for an appointment in the last year OR does the patient have an upcoming appointment? Yes.    Agent: Please be advised that RX refills may take up to 3 business days. We ask that you follow-up with your pharmacy.

## 2022-01-04 ENCOUNTER — Other Ambulatory Visit: Payer: Self-pay

## 2022-01-04 ENCOUNTER — Other Ambulatory Visit: Payer: Self-pay | Admitting: Internal Medicine

## 2022-01-04 DIAGNOSIS — K7031 Alcoholic cirrhosis of liver with ascites: Secondary | ICD-10-CM

## 2022-01-04 MED ORDER — FUROSEMIDE 40 MG PO TABS
40.0000 mg | ORAL_TABLET | Freq: Every day | ORAL | 1 refills | Status: DC
  Filled 2022-01-04: qty 30, 30d supply, fill #0
  Filled 2022-02-14: qty 30, 30d supply, fill #1

## 2022-01-04 MED ORDER — GABAPENTIN 300 MG PO CAPS
300.0000 mg | ORAL_CAPSULE | Freq: Three times a day (TID) | ORAL | 1 refills | Status: DC
  Filled 2022-01-04: qty 90, 30d supply, fill #0
  Filled 2022-02-14: qty 90, 30d supply, fill #1

## 2022-01-04 MED ORDER — LACTULOSE 10 GM/15ML PO SOLN
20.0000 g | Freq: Two times a day (BID) | ORAL | 1 refills | Status: DC
  Filled 2022-01-04: qty 1892, 30d supply, fill #0
  Filled 2022-02-14: qty 1892, 30d supply, fill #1

## 2022-01-04 MED ORDER — FAMOTIDINE 20 MG PO TABS
20.0000 mg | ORAL_TABLET | Freq: Every day | ORAL | 1 refills | Status: DC
  Filled 2022-01-04: qty 30, 30d supply, fill #0
  Filled 2022-02-14: qty 30, 30d supply, fill #1

## 2022-01-04 MED ORDER — POTASSIUM CHLORIDE ER 20 MEQ PO TBCR
20.0000 meq | EXTENDED_RELEASE_TABLET | Freq: Every day | ORAL | 1 refills | Status: DC
  Filled 2022-01-04 (×2): qty 90, 90d supply, fill #0
  Filled 2022-01-04 – 2022-02-14 (×2): qty 30, 30d supply, fill #0

## 2022-01-04 MED ORDER — FOLIC ACID 1 MG PO TABS
1.0000 mg | ORAL_TABLET | Freq: Every day | ORAL | 2 refills | Status: DC
  Filled 2022-01-04: qty 30, 30d supply, fill #0
  Filled 2022-02-14: qty 30, 30d supply, fill #1

## 2022-01-04 MED ORDER — PANTOPRAZOLE SODIUM 40 MG PO TBEC
40.0000 mg | DELAYED_RELEASE_TABLET | Freq: Every day | ORAL | 2 refills | Status: DC
  Filled 2022-01-04: qty 30, 30d supply, fill #0
  Filled 2022-02-14: qty 30, 30d supply, fill #1

## 2022-01-04 NOTE — Telephone Encounter (Signed)
Requested Prescriptions  Pending Prescriptions Disp Refills  . pantoprazole (PROTONIX) 40 MG tablet 30 tablet 2    Sig: Take 1 tablet (40 mg total) by mouth daily.     Gastroenterology: Proton Pump Inhibitors Passed - 01/03/2022 10:44 AM      Passed - Valid encounter within last 12 months    Recent Outpatient Visits          1 month ago Neuropathy of left lower extremity   [place] [facility] Marcine Matar, MD   2 months ago Neuropathy of left lower extremity   [facility] [place], [place] W, NP   5 months ago Morbid obesity with BMI of 40.0-44.9, adult [facility])   [place] [facility] Jonah Blue B, MD   8 months ago Alcoholic cirrhosis of liver without ascites [facility])   [place] [facility] Marcine Matar, MD   10 months ago Acute pain of right thigh   [facility] Mayers, Kasandra Knudsen, PA-C      Future Appointments            In 2 months Laural Benes, Binnie Rail, MD [facility]           . gabapentin (NEURONTIN) 300 MG capsule 90 capsule 1    Sig: Take 1 capsule (300 mg total) by mouth 3 (three) times daily.     Neurology: Anticonvulsants - gabapentin Failed - 01/03/2022 10:44 AM      Failed - Cr in normal range and within 360 days    Creatinine, Ser  Date Value Ref Range Status  10/30/2021 0.63 (L) 0.76 - 1.27 mg/dL Final         Passed - Completed PHQ-2 or PHQ-9 in the last 360 days      Passed - Valid encounter within last 12 months    Recent Outpatient Visits          1 month ago Neuropathy of left lower extremity   [place] [facility] Marcine Matar, MD   2 months ago Neuropathy of left lower extremity   [place] [facility] [place], [place] W, NP   5 months ago Morbid obesity with BMI of 40.0-44.9, adult [facility])   [place] [facility] Jonah Blue B,  MD   8 months ago Alcoholic cirrhosis of liver without ascites [facility])   [place] [facility] Marcine Matar, MD   10 months ago Acute pain of right thigh   [facility] Mayers, Kasandra Knudsen, [place]      Future Appointments            In 2 months Laural Benes, Binnie Rail, MD [facility]           . famotidine (PEPCID) 20 MG tablet 90 tablet 1    Sig: Take 1 tablet (20 mg total) by mouth daily.     Gastroenterology:  H2 Antagonists Passed - 01/03/2022 10:44 AM      Passed - Valid encounter within last 12 months    Recent Outpatient Visits          1 month ago Neuropathy of left lower extremity   [place] [facility] Marcine Matar, MD   2 months ago Neuropathy of left lower extremity    [facility] [place] W, NP   5 months ago Morbid obesity with BMI of 40.0-44.9, adult [facility])   [place] [facility] Jonah Blue B, MD   8 months ago Alcoholic cirrhosis of liver without ascites [facility])   [place] [facility] Marcine Matar, MD   10 months ago Acute pain of right thigh   [facility] Mayers, Kasandra Knudsen, PA-C      Future Appointments            In 2 months Laural Benes, Binnie Rail, MD [facility]           . lactulose (CHRONULAC) 10 GM/15ML solution 1892 mL 1    Sig: TAKE 30 MLS BY MOUTH 2 TIMES DAILY     Gastroenterology:  Laxatives - lactulose Failed - 01/03/2022 10:44 AM      Failed - Cl in normal range and within 360 days    Chloride  Date Value Ref Range Status  10/30/2021 109 (H) 96 - 106 mmol/L Final         Passed - CO2 in normal range and within 360 days    CO2  Date Value Ref Range Status  10/30/2021 20 20 - 29 mmol/L Final         Passed - K in normal range and within 360 days    Potassium  Date Value Ref Range Status  10/30/2021  3.5 3.5 - 5.2 mmol/L Final         Passed - Na in normal range and within 360 days    Sodium  Date Value Ref Range Status  10/30/2021 141 134 - 144 mmol/L Final         Passed - Valid encounter within last 12 months    Recent Outpatient Visits          1 month ago Neuropathy of left lower extremity   [place] [facility] Marcine Matar, MD   2 months ago Neuropathy of left lower extremity   [place] [facility] [place], [place] W, NP   5 months ago Morbid obesity with BMI of 40.0-44.9, adult [facility])   [place] [facility] Jonah Blue B, MD   8 months ago Alcoholic cirrhosis of liver without ascites [facility])   [place] [facility] Marcine Matar, MD   10 months ago Acute pain of right thigh   [place] [address] [facility] Mayers, Kasandra Knudsen, [place]      Future Appointments            In 2 months Laural Benes, Binnie Rail, MD [facility]           . furosemide (LASIX) 40 MG tablet 90 tablet 1    Sig: Take 1 tablet (40 mg total) by mouth daily.     Cardiovascular:  Diuretics - Loop Failed - 01/03/2022 10:44 AM      Failed - Ca in normal range and within 180 days    Calcium  Date Value Ref Range Status  10/30/2021 8.5 (L) 8.7 - 10.2 mg/dL Final   Calcium, Ion  Date Value Ref Range Status  06/09/2019 1.30 1.15 - 1.40 mmol/L Final         Failed - Cr in normal range and within 180 days    Creatinine, Ser  Date Value Ref Range Status  10/30/2021 0.63 (  L) 0.76 - 1.27 mg/dL Final         Failed - Cl in normal range and within 180 days    Chloride  Date Value Ref Range Status  10/30/2021 109 (H) 96 - 106 mmol/L Final         Failed - Mg Level in normal range and within 180 days    Magnesium  Date Value Ref Range Status  05/01/2018 1.9 1.7 - 2.4 mg/dL Final    Comment:    Performed at [facility], [place] [address]  [place], [place] [address]         Passed - K in normal range and within 180 days    Potassium  Date Value Ref Range Status  10/30/2021 3.5 3.5 - 5.2 mmol/L Final         Passed - Na in normal range and within 180 days    Sodium  Date Value Ref Range Status  10/30/2021 141 134 - 144 mmol/L Final         Passed - Last BP in normal range    BP Readings from Last 1 Encounters:  11/17/21 130/80         Passed - Valid encounter within last 6 months    Recent Outpatient Visits          1 month ago Neuropathy of left lower extremity   [place] Lad[place] Pier, MD   2 months ago Neuropathy of left lower extremity   [place], [place] W, NP   5 months ago Morbid obesity with BMI of 40.0-44.9, adult [facility])   [place] B, MD   8 months ago Alcoholic cirrhosis of liver without ascites [facility])   [place], MD   10 months ago Acute pain of right thigh   [place] Mayers, Loraine Grip, [place]      Future Appointments            In 2 months Wynetta Emery, Dalbert Batman, MD [place]           . Potassium Chloride ER 20 MEQ TBCR 90 tablet 1    Sig: Take 1 tablet  ( 20 mEq)  by mouth daily.     Endocrinology:  Minerals - Potassium Supplementation Failed - 01/03/2022 10:44 AM      Failed - Cr in normal range and within 360 days    Creatinine, Ser  Date Value Ref Range Status  10/30/2021 0.63 (L) 0.76 - 1.27 mg/dL Final         Passed - K in normal range and within 360 days    Potassium  Date Value Ref Range Status  10/30/2021 3.5 3.5 - 5.2 mmol/L Final         Passed - Valid encounter within last 12 months    Recent Outpatient Visits          1 month ago Neuropathy of left lower extremity   [place], MD   2 months  ago Neuropathy of left lower extremity   [place], [place] W, NP   5 months ago Morbid obesity with BMI of 40.0-44.9, adult [facility])   [place] Ladell Pier, MD   8 months ago  Alcoholic cirrhosis of liver without ascites [facility])   [place], MD   10 months ago Acute pain of right thigh   [place] Mayers, Loraine Grip, PA-C      Future Appointments            In 2 months Wynetta Emery, Dalbert Batman, MD [place]

## 2022-02-14 ENCOUNTER — Other Ambulatory Visit: Payer: Self-pay

## 2022-02-15 ENCOUNTER — Other Ambulatory Visit: Payer: Self-pay

## 2022-02-26 ENCOUNTER — Other Ambulatory Visit: Payer: Self-pay | Admitting: Internal Medicine

## 2022-02-26 DIAGNOSIS — K7030 Alcoholic cirrhosis of liver without ascites: Secondary | ICD-10-CM

## 2022-02-26 DIAGNOSIS — K7031 Alcoholic cirrhosis of liver with ascites: Secondary | ICD-10-CM

## 2022-02-26 DIAGNOSIS — E876 Hypokalemia: Secondary | ICD-10-CM

## 2022-02-26 DIAGNOSIS — G5792 Unspecified mononeuropathy of left lower limb: Secondary | ICD-10-CM

## 2022-02-26 NOTE — Telephone Encounter (Signed)
Medication Refill - Medication: Rx #: [id number]  folic acid (FOLVITE) 1 MG tablet [[id number]]  Rx #: [id number]  Potassium Chloride ER 20 MEQ TBCR [[id number]]  Rx #: [id number]  furosemide (LASIX) 40 MG tablet [[id number]]  Rx #: [id number]  lactulose (CHRONULAC) 10 GM/15ML solution [[id number]]  Rx #: [id number]  famotidine (PEPCID) 20 MG tablet [[id number]]  Rx #: [id number]  gabapentin (NEURONTIN) 300 MG capsule [[id number]]  Rx #: [id number]  pantoprazole (PROTONIX) 40 MG tablet [[id number]]   Has the patient contacted their pharmacy? Yes.   (Agent: If no, request that the patient contact the pharmacy for the refill. If patient does not wish to contact the pharmacy document the reason why and proceed with request.) (Agent: If yes, when and what did the pharmacy advise?)  Preferred Pharmacy (with phone number or street name): [facility] [address] [place] [address] [phone] Has the patient been seen for an appointment in the last year OR does the patient have an upcoming appointment? Yes.    Agent: Please be advised that RX refills may take up to 3 business days. We ask that you follow-up with your pharmacy.

## 2022-02-27 MED ORDER — FUROSEMIDE 40 MG PO TABS: 40.0000 mg | ORAL_TABLET | Freq: Every day | ORAL | 0 refills | Status: DC

## 2022-02-27 MED ORDER — POTASSIUM CHLORIDE ER 20 MEQ PO TBCR: 20.0000 meq | EXTENDED_RELEASE_TABLET | Freq: Every day | ORAL | 0 refills | Status: DC

## 2022-02-27 MED ORDER — FOLIC ACID 1 MG PO TABS: 1.0000 mg | ORAL_TABLET | Freq: Every day | ORAL | 1 refills | Status: DC

## 2022-02-27 MED ORDER — GABAPENTIN 300 MG PO CAPS: 300.0000 mg | ORAL_CAPSULE | Freq: Three times a day (TID) | ORAL | 0 refills | Status: DC

## 2022-02-27 MED ORDER — FAMOTIDINE 20 MG PO TABS: 20.0000 mg | ORAL_TABLET | Freq: Every day | ORAL | 0 refills | Status: DC

## 2022-02-27 MED ORDER — PANTOPRAZOLE SODIUM 40 MG PO TBEC: 40.0000 mg | DELAYED_RELEASE_TABLET | Freq: Every day | ORAL | 1 refills | Status: DC

## 2022-02-27 MED ORDER — LACTULOSE 10 GM/15ML PO SOLN: 20.0000 g | Freq: Two times a day (BID) | ORAL | 0 refills | Status: DC

## 2022-02-27 NOTE — Telephone Encounter (Signed)
Change of pharmacy Requested Prescriptions  Pending Prescriptions Disp Refills  . folic acid (FOLVITE) 1 MG tablet 30 tablet 1    Sig: Take 1 tablet (1 mg total) by mouth daily.     Endocrinology:  Vitamins Passed - 02/26/2022  3:42 PM      Passed - Valid encounter within last 12 months    Recent Outpatient Visits          3 months ago Neuropathy of left lower extremity   [place] [facility] Marcine Matar, MD   4 months ago Neuropathy of left lower extremity   [place] [facility] [place], [place] W, NP   7 months ago Morbid obesity with BMI of 40.0-44.9, adult [facility])   [place] [facility] Marcine Matar, MD   10 months ago Alcoholic cirrhosis of liver without ascites [facility])   [place] [facility] Marcine Matar, MD   1 year ago Acute pain of right thigh   [place] [address] [facility] [place], Kasandra Knudsen, PA-C      Future Appointments            In 4 weeks Marcine Matar, MD [facility]           . Potassium Chloride ER 20 MEQ TBCR 90 tablet 0    Sig: Take 1 tablet  ( 20 mEq)  by mouth daily.     Endocrinology:  Minerals - Potassium Supplementation Failed - 02/26/2022  3:42 PM      Failed - Cr in normal range and within 360 days    Creatinine, Ser  Date Value Ref Range Status  10/30/2021 0.63 (L) 0.76 - 1.27 mg/dL Final         Passed - K in normal range and within 360 days    Potassium  Date Value Ref Range Status  10/30/2021 3.5 3.5 - 5.2 mmol/L Final         Passed - Valid encounter within last 12 months    Recent Outpatient Visits          3 months ago Neuropathy of left lower extremity   [place] [facility] Marcine Matar, MD   4 months ago Neuropathy of left lower extremity   [place] [facility] [place], [place] W, NP   7 months ago Morbid obesity with BMI of 40.0-44.9, adult  [facility])   [place] [facility] Jonah Blue B, MD   10 months ago Alcoholic cirrhosis of liver without ascites [facility])   [place] [facility] Marcine Matar, MD   1 year ago Acute pain of right thigh   [place] [address] [facility] [place], Kasandra Knudsen, [place]      Future Appointments            In 4 weeks Marcine Matar, MD [facility]           . furosemide (LASIX) 40 MG tablet 90 tablet 0    Sig: Take 1 tablet (40 mg total) by mouth daily.     Cardiovascular:  Diuretics - Loop Failed - 02/26/2022  3:42 PM      Failed - Ca in normal range and within 180 days    Calcium  Date Value Ref Range Status  10/30/2021 8.5 (L) 8.7 - 10.2 mg/dL Final  Calcium, Ion  Date Value Ref Range Status  06/09/2019 1.30 1.15 - 1.40 mmol/L Final         Failed - Cr in normal range and within 180 days    Creatinine, Ser  Date Value Ref Range Status  10/30/2021 0.63 (L) 0.76 - 1.27 mg/dL Final         Failed - Cl in normal range and within 180 days    Chloride  Date Value Ref Range Status  10/30/2021 109 (H) 96 - 106 mmol/L Final         Failed - Mg Level in normal range and within 180 days    Magnesium  Date Value Ref Range Status  05/01/2018 1.9 1.7 - 2.4 mg/dL Final    Comment:    Performed at [facility], [address], [place], [place] [address]         Passed - K in normal range and within 180 days    Potassium  Date Value Ref Range Status  10/30/2021 3.5 3.5 - 5.2 mmol/L Final         Passed - Na in normal range and within 180 days    Sodium  Date Value Ref Range Status  10/30/2021 141 134 - 144 mmol/L Final         Passed - Last BP in normal range    BP Readings from Last 1 Encounters:  11/17/21 130/80         Passed - Valid encounter within last 6 months    Recent Outpatient Visits          3 months ago Neuropathy of left lower extremity   [place]  [facility] Marcine Matar, MD   4 months ago Neuropathy of left lower extremity   [place] [facility] [place], [place] W, NP   7 months ago Morbid obesity with BMI of 40.0-44.9, adult [facility])   [place] [facility] Jonah Blue B, MD   10 months ago Alcoholic cirrhosis of liver without ascites [facility])   [place] [facility] Marcine Matar, MD   1 year ago Acute pain of right thigh   [place] [address] [facility] [place], Kasandra Knud[place], [place]      Future Appointments            In 4 weeks Marcine Matar, MD [facility]           . lactulose (CHRONULAC) 10 GM/15ML solution 1892 mL 0    Sig: Take 30 mLs (20 g total) by mouth 2 (two) times daily.     Gastroenterology:  Laxatives - lactulose Failed - 02/26/2022  3:42 PM      Failed - Cl in normal range and within 360 days    Chloride  Date Value Ref Range Status  10/30/2021 109 (H) 96 - 106 mmol/L Final         Passed - CO2 in normal range and within 360 days    CO2  Date Value Ref Range Status  10/30/2021 20 20 - 29 mmol/L Final         Passed - K in normal range and within 360 days    Potassium  Date Value Ref Range Status  10/30/2021 3.5 3.5 - 5.2 mmol/L Final         Passed - Na in normal range and within 360 days  Sodium  Date Value Ref Range Status  10/30/2021 141 134 - 144 mmol/L Final         Passed - Valid encounter within last 12 months    Recent Outpatient Visits          3 months ago Neuropathy of left lower extremity   [place] [facility] Marcine Matar, MD   4 months ago Neuropathy of left lower extremity   [place] [facility] [place], [place] W, NP   7 months ago Morbid obesity with BMI of 40.0-44.9, adult [facility])   [place] [facility] Marcine Matar, MD   10 months ago Alcoholic cirrhosis of liver without  ascites [facility])   [place] [facility] Marcine Matar, MD   1 year ago Acute pain of right thigh   [place] [address] [facility] [place], Kasandra Knudsen, [place]      Future Appointments            In 4 weeks Marcine Matar, MD [facility]           . famotidine (PEPCID) 20 MG tablet 90 tablet 0    Sig: Take 1 tablet (20 mg total) by mouth daily.     Gastroenterology:  H2 Antagonists Passed - 02/26/2022  3:42 PM      Passed - Valid encounter within last 12 months    Recent Outpatient Visits          3 months ago Neuropathy of left lower extremity   [place] [facility] Marcine Matar, MD   4 months ago Neuropathy of left lower extremity   [place] [facility] [place], [place] W, NP   7 months ago Morbid obesity with BMI of 40.0-44.9, adult [facility])   [place] [facility] Marcine Matar, MD   10 months ago Alcoholic cirrhosis of liver without ascites [facility])   [place] [facility] Marcine Matar, MD   1 year ago Acute pain of right thigh   [place] [address] [facility] [place], Kasandra Knudsen, PA-C      Future Appointments            In 4 weeks Marcine Matar, MD [facility]           . gabapentin (NEURONTIN) 300 MG capsule 90 capsule 0    Sig: Take 1 capsule (300 mg total) by mouth 3 (three) times daily.     Neurology: Anticonvulsants - gabapentin Failed - 02/26/2022  3:42 PM      Failed - Cr in normal range and within 360 days    Creatinine, Ser  Date Value Ref Range Status  10/30/2021 0.63 (L) 0.76 - 1.27 mg/dL Final         Passed - Completed PHQ-2 or PHQ-9 in the last 360 days      Passed - Valid encounter within last 12 months    Recent Outpatient Visits          3 months ago Neuropathy of left lower extremity   [place] [facility] Marcine Matar,  MD   4 months ago Neuropathy of left lower extremity   [place] [facility] [place], [place] W, NP   7 months ago Morbid obesity with BMI of 40.0-44.9, adult [facility])   [place], [place]   10 months ago Alcoholic cirrhosis of liver without ascites [facility])   [place], MD   1 year ago Acute pain of right thigh   [place], Loraine [place], [place]      Future Appointments            In 4 weeks Ladell Pier, MD [place]           . pantoprazole (PROTONIX) 40 MG tablet 30 tablet 1    Sig: Take 1 tablet (40 mg total) by mouth daily.     Gastroenterology: Proton Pump Inhibitors Passed - 02/26/2022  3:42 PM      Passed - Valid encounter within last 12 months    Recent Outpatient Visits          3 months ago Neuropathy of left lower extremity   [place], Deborah B, MD   4 months ago Neuropathy of left lower extremity   [place], [place] W, NP   7 months ago Morbid obesity with BMI of 40.0-44.9, adult [facility])   [place], MD   10 months ago Alcoholic cirrhosis of liver without ascites [facility])   [employer], MD   1 year ago Acute pain of right thigh   [place], Loraine [place], [place]      Future Appointments            In 4 weeks Ladell Pier, MD [place]

## 2022-03-27 ENCOUNTER — Other Ambulatory Visit: Payer: Self-pay

## 2022-03-27 ENCOUNTER — Ambulatory Visit: Payer: Commercial Managed Care - HMO | Attending: Internal Medicine | Admitting: Internal Medicine

## 2022-03-27 ENCOUNTER — Encounter: Payer: Self-pay | Admitting: Internal Medicine

## 2022-03-27 VITALS — BP 126/80 | HR 64 | Temp 98.1°F | Ht 65.0 in | Wt 265.0 lb

## 2022-03-27 DIAGNOSIS — Z6841 Body Mass Index (BMI) 40.0 and over, adult: Secondary | ICD-10-CM

## 2022-03-27 DIAGNOSIS — K7030 Alcoholic cirrhosis of liver without ascites: Secondary | ICD-10-CM

## 2022-03-27 DIAGNOSIS — G5792 Unspecified mononeuropathy of left lower limb: Secondary | ICD-10-CM | POA: Diagnosis not present

## 2022-03-27 DIAGNOSIS — Z23 Encounter for immunization: Secondary | ICD-10-CM

## 2022-03-27 MED ORDER — PANTOPRAZOLE SODIUM 40 MG PO TBEC
40.0000 mg | DELAYED_RELEASE_TABLET | Freq: Every day | ORAL | 1 refills | Status: DC
  Filled 2022-03-27: qty 30, 30d supply, fill #0

## 2022-03-27 MED ORDER — GABAPENTIN 300 MG PO CAPS
300.0000 mg | ORAL_CAPSULE | Freq: Three times a day (TID) | ORAL | 1 refills | Status: DC
  Filled 2022-03-27: qty 90, 30d supply, fill #0

## 2022-03-27 MED ORDER — FOLIC ACID 1 MG PO TABS
1.0000 mg | ORAL_TABLET | Freq: Every day | ORAL | 1 refills | Status: DC
  Filled 2022-03-27: qty 30, 30d supply, fill #0

## 2022-03-27 MED ORDER — FLUOROMETHOLONE 0.1 % OP SUSP
OPHTHALMIC | 0 refills | Status: AC
  Filled 2022-03-27: qty 10, 28d supply, fill #0

## 2022-03-27 NOTE — Progress Notes (Signed)
Patient ID: Jason [name], male    DOB: 1981-04-12  MRN: [id number]  CC: Follow-up (Cirrohsis f/u. Smith Mince of "losing strength" for 2-3 min when eating something sugary. (2-3 per mo)/Opthomologist prescribed eye drops & has not received. Unable to communicate with MD due to language barrier. Valentino Hue to flu vax.)   Subjective: Jason [name] is a 41 y.o. male who presents for chronic ds management His concerns today include:  Patient with history of severe ETOH use disorder in remission, alcohol induced cirrhosis, SBP, varices, pain/numbness left thigh on gabapentin  Referred to ophthalmologist on last visit for what looked liked Pterygium of RT eye. Saw Alma Downs at [facility].  Prescribed glasses and eye drops.  Reports rxn for drops sent to our pharmacy.  He is ordered the glasses already and waiting for them to be made.  Cirrhosis: He remains free of alcohol.  [facility].  Has an appointment coming up in December.  He continues to take furosemide with potassium supplement and lactulose.  Has about 3 bowel movements daily.  No lower extremity edema. Requests refill on gabapentin and pantoprazole.  Reports he is doing well on the gabapentin for neuropathy symptoms in his leg.  C/o intermittent feelings of decrease strength if he skips meals.  Occurs about 1-2 times Q 1-2 mths.  Feeling goes away as soon as he eats something. Wgh up 10 lbs in past 4 mths.  He feels that he lacks control over his eating.  Does not exercise. Patient Active Problem List   Diagnosis Date Noted   Ruptured tympanic membrane, right 11/17/2021   Neuropathy of left lower extremity 11/17/2021   Abdominal pain 10/10/2021   Fever 10/10/2021   Pulmonary hypertension (HCC) 09/18/2021   Gastritis and gastroduodenitis    Hematuria 04/25/2021   Gynecomastia 04/25/2021   Hypertension 09/13/2020   Portal hypertensive gastropathy (HCC)     Esophageal varices without bleeding (HCC)    Former smoker 06/10/2018   Alcoholic cirrhosis (HCC) 05/01/2018   Macrocytic anemia 05/01/2018   Hypokalemia 05/01/2018   Liver lesion, right lobe 05/01/2018   Liver failure (HCC) 05/01/2018   Liver failure without hepatic coma (HCC)    Alcohol use disorder, severe, in sustained remission (HCC)    Thrombocytopenia (HCC) 03/04/2018     Current Outpatient Medications on File Prior to Visit  Medication Sig Dispense Refill   bismuth subsalicylate (PEPTO BISMOL) 262 MG chewable tablet Chew 524 mg by mouth as needed for indigestion or diarrhea or loose stools.     famotidine (PEPCID) 20 MG tablet Take 1 tablet (20 mg total) by mouth daily. 90 tablet 0   furosemide (LASIX) 40 MG tablet Take 1 tablet (40 mg total) by mouth daily. 90 tablet 0   lactulose (CHRONULAC) 10 GM/15ML solution Take 30 mLs (20 g total) by mouth 2 (two) times daily. 1892 mL 0   Multiple Vitamin (MULTIVITAMIN WITH MINERALS) TABS tablet Take 1 tablet by mouth daily. 30 tablet 2   neomycin-polymyxin b-dexamethasone (MAXITROL) 3.5-10000-0.1 SUSP Use 5 drops in right ear 2 times daily for 14 days 5 mL 3   Potassium Chloride ER 20 MEQ TBCR Take 1 tablet  ( 20 mEq)  by mouth daily. 90 tablet 0   thiamine 100 MG tablet Take 1 tablet (100 mg total) by mouth daily. 30 tablet 5   No current facility-administered medications on file prior to visit.    Allergies  Allergen  Reactions   Acetaminophen Other (See Comments)    Cirrhosis of the liver    Social History   Socioeconomic History   Marital status: Legally Separated    Spouse name: Not on file   Number of children: Not on file   Years of education: Not on file   Highest education level: Not on file  Occupational History   Occupation: Education administrator Conservation officer, historic buildings)  Tobacco Use   Smoking status: Former   Smokeless tobacco: Never   Tobacco comments:    said he has tried it  Building services engineer Use: Never used  Substance and  Sexual Activity   Alcohol use: Not Currently    Comment: 03/05/2018   Drug use: Never   Sexual activity: Yes  Other Topics Concern   Not on file  Social History Narrative   ** Merged History Encounter **       Lives in [place] with wife and daughter.    Social Determinants of Health   Financial Resource Strain: Not on file  Food Insecurity: Not on file  Transportation Needs: Not on file  Physical Activity: Not on file  Stress: Not on file  Social Connections: Not on file  Intimate Partner Violence: Not on file    Family History  Problem Relation Age of Onset   Colon cancer Neg Hx    Esophageal cancer Neg Hx    Healthy Mother    Healthy Daughter     Past Surgical History:  Procedure Laterality Date   BIOPSY  08/10/2019   Procedure: BIOPSY;  Surgeon: Shellia Cleverly, DO;  Location: [facility];  Service: Gastroenterology;;   BIOPSY  02/08/2020   Procedure: BIOPSY;  Surgeon: Shellia Cleverly, DO;  Location: [facility];  Service: Gastroenterology;;   BIOPSY  09/11/2021   Procedure: BIOPSY;  Surgeon: Shellia Cleverly, DO;  [facility]: [facility];  Service: Gastroenterology;;   ESOPHAGEAL BANDING  08/10/2019   Procedure: ESOPHAGEAL BANDING;  Surgeon: Shellia Cleverly, DO;  Location: [facility];  Service: Gastroenterology;;   ESOPHAGOGASTRODUODENOSCOPY (EGD) WITH PROPOFOL N/A 08/10/2019   Procedure: ESOPHAGOGASTRODUODENOSCOPY (EGD) WITH PROPOFOL;  Surgeon: Shellia Cleverly, DO;  Location: [facility];  Service: Gastroenterology;  Laterality: N/A;   ESOPHAGOGASTRODUODENOSCOPY (EGD) WITH PROPOFOL N/A 02/08/2020   Procedure: ESOPHAGOGASTRODUODENOSCOPY (EGD) WITH PROPOFOL;  Surgeon: Shellia Cleverly, DO;  [facility]: [facility];  Service: Gastroenterology;  Laterality: N/A;   ESOPHAGOGASTRODUODENOSCOPY (EGD) WITH PROPOFOL N/A 09/11/2021   Procedure: ESOPHAGOGASTRODUODENOSCOPY (EGD) WITH PROPOFOL;  Surgeon: Shellia Cleverly, DO;  Location: [facility];  Service:  Gastroenterology;  Laterality: N/A;   FOOT SURGERY Left    around 2014. a scar on lower leg   ORIF ANKLE FRACTURE Right 06/09/2019   Procedure: RIGHT OPEN REDUCTION INTERNAL FIXATION (ORIF) ANKLE FRACTURE;  Surgeon: Sheral Apley, MD;  Location: [facility];  Service: Orthopedics;  Laterality: Right;    ROS: Review of Systems Negative except as stated above  PHYSICAL EXAM: BP 126/80   Pulse 64   Temp 98.1 F (36.7 C) (Oral)   Ht 5\' 5"  (1.651 m)   Wt 265 lb (120.2 kg)   SpO2 100%   BMI 44.10 kg/m   Wt Readings from Last 3 Encounters:  03/27/22 265 lb (120.2 kg)  11/17/21 255 lb 9.6 oz (115.9 kg)  10/30/21 252 lb 12.8 oz (114.7 kg)    Physical Exam   General appearance - alert, well appearing, morbidly obese middle-age Hispanic male in NAD.  He has mild  gynecomastia.   Mental status - normal mood, behavior, speech, dress, motor activity, and thought processes Neck - supple, no significant adenopathy Chest - clear to auscultation, no wheezes, rales or rhonchi, symmetric air entry Heart - normal rate, regular rhythm, normal S1, S2, no murmurs, rubs, clicks or gallops Extremities - peripheral pulses normal, no pedal edema, no clubbing or cyanosis     Latest Ref Rng & Units 10/30/2021    9:11 AM 10/17/2021    3:30 PM 10/12/2021    4:00 AM  CMP  Glucose 70 - 99 mg/dL 161119  90  [id number]   BUN 6 - 20 mg/dL 6  9  9    Creatinine 0.76 - 1.27 mg/dL 0.450.63  4.090.65  8.110.46   Sodium 134 - 144 mmol/L 141  138  138   Potassium 3.5 - 5.2 mmol/L 3.5  3.2  5.1   Chloride 96 - 106 mmol/L 109  107  112   CO2 20 - 29 mmol/L 20  17  20    Calcium 8.7 - 10.2 mg/dL 8.5  8.6  8.0   Total Protein 6.0 - 8.5 g/dL 6.6   5.3   Total Bilirubin 0.0 - 1.2 mg/dL 1.6   2.1   Alkaline Phos 44 - 121 IU/L 163   80   AST 0 - 40 IU/L 51   74   ALT 0 - 44 IU/L 32   31    Lipid Panel     Component Value Date/Time   CHOL 150 06/16/2020 1647   TRIG 65 06/16/2020 1647   HDL 85 06/16/2020 1647    CHOLHDL 1.8 06/16/2020 1647   LDLCALC 52 06/16/2020 1647    CBC    Component Value Date/Time   WBC 5.2 10/12/2021 0400   RBC 4.13 (L) 10/12/2021 0400   HGB 13.7 10/12/2021 0400   HGB 14.9 06/16/2020 1647   HCT 39.7 10/12/2021 0400   HCT 42.6 06/16/2020 1647   PLT 42 (L) 10/12/2021 0400   PLT 44 (LL) 06/16/2020 1647   MCV 96.1 10/12/2021 0400   MCV 95 06/16/2020 1647   MCH 33.2 10/12/2021 0400   MCHC 34.5 10/12/2021 0400   RDW 14.4 10/12/2021 0400   RDW 13.9 06/16/2020 1647   LYMPHSABS 1.7 10/12/2021 0400   LYMPHSABS 1.6 05/07/2019 1413   MONOABS 0.7 10/12/2021 0400   EOSABS 0.2 10/12/2021 0400   EOSABS 0.1 05/07/2019 1413   BASOSABS 0.0 10/12/2021 0400   BASOSABS 0.0 05/07/2019 1413    ASSESSMENT AND PLAN: 1. Alcoholic cirrhosis of liver without ascites (HCC) Compensated. [facility] Continue Furosemide and Lactulose - Comprehensive metabolic panel - folic acid (FOLVITE) 1 MG tablet; Take 1 tablet (1 mg total) by mouth daily.  Dispense: 30 tablet; Refill: 1  2. Morbid obesity with BMI of 40.0-44.9, adult (HCC) Patient advised to eliminate sugary drinks from the diet, cut back on portion sizes especially of white carbohydrates, eat more white lean meat like chicken Malawiturkey and seafood instead of beef or pork and incorporate fresh fruits and vegetables into the diet daily. -He is agreeable to seeing a nutritionist. Encouraged him to get in about 30 minutes of moderate intensity exercise at least 5 days a week. Follow-up in about 2 months to see how he is doing and consider starting medication to help with weight loss. - Hemoglobin A1c - Comprehensive metabolic panel - Amb ref to Medical Nutrition Therapy-MNT  3. Need for immunization against influenza - Flu Vaccine QUAD 5269mo+IM (  Fluarix, Fluzone & Alfiuria Quad PF)  4. Neuropathy of left lower extremity Stable on and tolerating Gabapentin - gabapentin (NEURONTIN) 300 MG capsule; Take 1 capsule (300 mg  total) by mouth 3 (three) times daily.  Dispense: 90 capsule; Refill: 1    AM[employer] Language interpreter used during this encounter. #Miguel [phone]  Patient was given the opportunity to ask questions.  Patient verbalized understanding of the plan and was able to repeat key elements of the plan.   This documentation was completed using Paediatric nurse.  Any transcriptional errors are unintentional.  Orders Placed This Encounter  Procedures   Flu Vaccine QUAD 8mo+IM (Fluarix, Fluzone & Alfiuria Quad PF)   Hemoglobin A1c   Comprehensive metabolic panel   Amb ref to Medical Nutrition Therapy-MNT     Requested Prescriptions   Signed Prescriptions Disp Refills   folic acid (FOLVITE) 1 MG tablet 30 tablet 1    Sig: Take 1 tablet (1 mg total) by mouth daily.   gabapentin (NEURONTIN) 300 MG capsule 90 capsule 1    Sig: Take 1 capsule (300 mg total) by mouth 3 (three) times daily.   pantoprazole (PROTONIX) 40 MG tablet 30 tablet 1    Sig: Take 1 tablet (40 mg total) by mouth daily.    Return in about 2 months (around 05/27/2022) for obesity.  Jonah Blue, MD, FACP

## 2022-03-28 LAB — COMPREHENSIVE METABOLIC PANEL
ALT: 35 IU/L (ref 0–44)
AST: 48 IU/L — ABNORMAL HIGH (ref 0–40)
Albumin/Globulin Ratio: 1.8 (ref 1.2–2.2)
Albumin: 4.1 g/dL (ref 4.1–5.1)
Alkaline Phosphatase: 173 IU/L — ABNORMAL HIGH (ref 44–121)
BUN/Creatinine Ratio: 12 (ref 9–20)
BUN: 7 mg/dL (ref 6–20)
Bilirubin Total: 1.4 mg/dL — ABNORMAL HIGH (ref 0.0–1.2)
CO2: 22 mmol/L (ref 20–29)
Calcium: 8.8 mg/dL (ref 8.7–10.2)
Chloride: 109 mmol/L — ABNORMAL HIGH (ref 96–106)
Creatinine, Ser: 0.59 mg/dL — ABNORMAL LOW (ref 0.76–1.27)
Globulin, Total: 2.3 g/dL (ref 1.5–4.5)
Glucose: 95 mg/dL (ref 70–99)
Potassium: 3.4 mmol/L — ABNORMAL LOW (ref 3.5–5.2)
Sodium: 146 mmol/L — ABNORMAL HIGH (ref 134–144)
Total Protein: 6.4 g/dL (ref 6.0–8.5)
eGFR: 127 mL/min/{1.73_m2} (ref >59)

## 2022-05-07 ENCOUNTER — Ambulatory Visit: Payer: Commercial Managed Care - HMO | Admitting: Dietician

## 2022-05-21 ENCOUNTER — Other Ambulatory Visit: Payer: Self-pay | Admitting: Nurse Practitioner

## 2022-05-21 DIAGNOSIS — K7031 Alcoholic cirrhosis of liver with ascites: Secondary | ICD-10-CM

## 2022-05-29 ENCOUNTER — Encounter: Payer: Self-pay | Admitting: Internal Medicine

## 2022-05-29 ENCOUNTER — Ambulatory Visit: Payer: 59 | Attending: Internal Medicine | Admitting: Internal Medicine

## 2022-05-29 ENCOUNTER — Other Ambulatory Visit: Payer: Self-pay

## 2022-05-29 DIAGNOSIS — F1021 Alcohol dependence, in remission: Secondary | ICD-10-CM

## 2022-05-29 DIAGNOSIS — G5792 Unspecified mononeuropathy of left lower limb: Secondary | ICD-10-CM

## 2022-05-29 DIAGNOSIS — K7030 Alcoholic cirrhosis of liver without ascites: Secondary | ICD-10-CM

## 2022-05-29 DIAGNOSIS — K766 Portal hypertension: Secondary | ICD-10-CM

## 2022-05-29 DIAGNOSIS — D696 Thrombocytopenia, unspecified: Secondary | ICD-10-CM | POA: Diagnosis not present

## 2022-05-29 DIAGNOSIS — Z6841 Body Mass Index (BMI) 40.0 and over, adult: Secondary | ICD-10-CM

## 2022-05-29 MED ORDER — LACTULOSE 10 GM/15ML PO SOLN
20.0000 g | Freq: Two times a day (BID) | ORAL | 2 refills | Status: DC
  Filled 2022-05-29: qty 1892, 32d supply, fill #0
  Filled 2022-07-25: qty 1892, 32d supply, fill #1
  Filled 2023-05-05: qty 1892, 32d supply, fill #2

## 2022-05-29 MED ORDER — FOLIC ACID 1 MG PO TABS
1.0000 mg | ORAL_TABLET | Freq: Every day | ORAL | 1 refills | Status: DC
  Filled 2022-05-29: qty 90, 90d supply, fill #0
  Filled 2022-07-25 – 2022-08-27 (×4): qty 90, 90d supply, fill #1

## 2022-05-29 MED ORDER — GABAPENTIN 300 MG PO CAPS
300.0000 mg | ORAL_CAPSULE | Freq: Three times a day (TID) | ORAL | 1 refills | Status: DC
  Filled 2022-05-29: qty 270, 90d supply, fill #0

## 2022-05-29 MED ORDER — FAMOTIDINE 20 MG PO TABS
20.0000 mg | ORAL_TABLET | Freq: Every day | ORAL | 1 refills | Status: DC
  Filled 2022-05-29: qty 90, 90d supply, fill #0
  Filled 2022-07-25 – 2022-08-27 (×4): qty 90, 90d supply, fill #1

## 2022-05-29 MED ORDER — THIAMINE HCL 100 MG PO TABS
100.0000 mg | ORAL_TABLET | Freq: Every day | ORAL | 1 refills | Status: DC
  Filled 2022-05-29 – 2022-07-25 (×3): qty 90, 90d supply, fill #0
  Filled 2022-08-27: qty 100, 100d supply, fill #0

## 2022-05-29 NOTE — Patient Instructions (Signed)
Alimentacin saludable Healthy Eating Seguir una modalidad de alimentacin saludable puede ayudarlo a alcanzar y mantener un peso saludable, reducir el riesgo de tener enfermedades crnicas y vivir una vida larga y productiva. Es importante que siga una modalidad de alimentacin saludable con un nivel adecuado de caloras para su cuerpo. Debe cubrir sus necesidades nutricionales principalmente a travs de los alimentos, escogiendo una variedad de alimentos ricos en nutrientes. Cules son algunos consejos para seguir este plan? Lea las etiquetas de los alimentos Lea las etiquetas y elija las que digan lo siguiente: Reducido en sodio o con bajo contenido de sodio. Jugos con 100 % jugo de fruta. Alimentos con bajo contenido de grasas saturadas y alto contenido de grasas poliinsaturadas y monoinsaturadas. Alimentos con cereales integrales, como trigo integral, trigo partido, arroz integral y arroz salvaje. Cereales integrales fortificados con cido flico. Se recomienda a las mujeres embarazadas o que desean quedar embarazadas. Lea las etiquetas y evite: Los alimentos con una gran cantidad de azcares agregados. Estos incluyen los alimentos que contienen azcar moreno, endulzante a base de maz, jarabe de maz, dextrosa, fructosa, glucosa, jarabe de maz de alta fructosa, miel, azcar invertido, lactosa, jarabe de malta, maltosa, melaza, azcar sin refinar, sacarosa, trehalosa y azcar turbinado. No consuma ms que las siguientes cantidades de azcar agregada por da: 6 cucharaditas (25 g) las mujeres. 9 cucharaditas (38 g) los hombres. Los alimentos que contienen almidones y cereales refinados o procesados. Los productos de cereales refinados, como harina blanca, harina de maz desgerminada, pan blanco y arroz blanco. Al ir de compras Elija refrigerios ricos en nutrientes, como verduras, frutas enteras y frutos secos. Evite los refrigerios con alto contenido de caloras y azcar, como las papas  fritas, los refrigerios frutales y los caramelos. Use alios y productos para untar a base de aceite con los alimentos en lugar de grasas slidas como la mantequilla, la margarina en barra o el queso crema. Limite las salsas, las mezclas y los productos "instantneos" preelaborados como el arroz saborizado, los fideos instantneos y las pastas listas para comer. Pruebe ms fuentes de protena vegetal, como tofu, tempeh, frijoles negros, edamame, lentejas, frutos secos y semillas. Explore planes de alimentacin como la dieta mediterrnea o la dieta vegetariana. Al cocinar Use aceite para saltear los alimentos en lugar de grasas slidas como mantequilla, margarina en barra o manteca de cerdo. En lugar de frer, trate de cocinar en el horno, en la plancha o en la parrilla, o hervir los alimentos. Retire la parte grasa de las carnes antes de cocinarlas. Cocine las verduras al vapor en agua o caldo. Planificacin de las comidas  En las comidas, imagine dividir su plato en cuartos: La mitad del plato tiene frutas y verduras. Un cuarto del plato tiene cereales integrales. Un cuarto del plato tiene protena, especialmente carnes magras, aves, huevos, tofu, frijoles o frutos secos. Incluya lcteos descremados en su dieta diaria. Estilo de vida Elija opciones saludables en todos los mbitos, como en el hogar, el trabajo, la escuela, los restaurantes y las tiendas. Prepare los alimentos de un modo seguro: Lvese las manos despus de manipular carnes crudas. Mantenga las superficies de preparacin de los alimentos limpias lavndolas regularmente con agua caliente y jabn. Mantenga las carnes crudas separadas de los alimentos que estn listos para comer como las frutas y las verduras. Cocine los frutos de mar, carnes, aves y huevos hasta alcanzar la temperatura interna recomendada. Almacene los alimentos a temperaturas seguras. En general: Mantenga los alimentos fros a una temperatura de 40   F (4,4 C)  o inferior. Mantenga los alimentos calientes a una temperatura de 140 F (60 C) o superior. Mantenga el congelador a una temperatura de 0 F (-17,8 C) o inferior. Los alimentos dejan de ser seguros para su consumo cuando han estado a una temperatura de entre 40 y 140 F (4,4 y 60 C) por ms de 2 horas. Qu alimentos debo consumir? Frutas Propngase comer el equivalente a 2 tazas de frutas frescas, enlatadas (en su jugo natural) o congeladas cada da. Algunos ejemplos de equivalentes a 1 taza de frutas son 1 manzana pequea, 8 fresas grandes, 1 taza de fruta enlatada,  taza de fruta desecada o 1 taza de jugo 100 %. Verduras Propngase comer el equivalente a 2 o 3 tazas de verduras frescas y congeladas cada da, incluyendo diferentes variedades y colores. Algunos ejemplos de equivalentes a 1 taza de verduras son 2 zanahorias medianas, 2 tazas de verduras de hoja verde crudas, 1 taza de verduras cortadas (crudas o cocidas) o 1 papa mediana al horno. Granos Propngase comer el equivalente a 6 onzas de cereales integrales por da. Algunos ejemplos de equivalentes a 1 onza de cereales son 1 rebanada de pan, 1 taza de cereal listo para comer, 3 tazas de palomitas de maz o  taza de arroz, pasta o cereales cocidos. Carnes y otras protenas Propngase comer el equivalente a 5 o 6 onzas de protena por da. Algunos ejemplos de equivalentes a 1 onza de protena son 1 huevo,  taza de frutos secos o semillas o 1 cucharada (16 g) de mantequilla de man. Un corte de carne o pescado del tamao de un mazo de cartas equivale aproximadamente a 3 a 4 onzas. De las protenas que consume cada semana, intente que al menos 8 onzas provengan de frutos de mar. Esto incluye el salmn, la trucha, el arenque y las anchoas. Lcteos Propngase comer el equivalente a 3 tazas de lcteos descremados o con bajo contenido de grasa cada da. Algunos ejemplos de equivalentes a 1 taza de lcteos son 1 taza (240 ml) de leche, 8  onzas (250 g) de yogur, 1 onzas (44 g) de queso natural o 1 taza (240 ml) de leche de soja fortificada. Grasas y aceites Propngase consumir alrededor de 5 cucharaditas (21 g) por da. Elija grasas monoinsaturadas, como el aceite de canola y de oliva, aguacate, mantequilla de man y la mayora de los frutos secos, o bien grasas poliinsaturadas, como el aceite de girasol, maz y soja, nueces, piones, semillas de ssamo, semillas de girasol y semillas de lino. Bebidas Propngase beber seis vasos de 8 onzas de agua por da. Limite el caf a entre tres y cinco tazas de 8 onzas por da. Limite el consumo de bebidas con cafena que tengan caloras agregadas, como los refrescos y las bebidas energizantes. Limite el consumo de alcohol a no ms de 1 medida por da si es mujer y no est embarazada, y a 2 medidas por da si es hombre. Una medida equivale a 12 onzas de cerveza (355 ml), 5 onzas de vino (148 ml) o 1 onzas de bebidas alcohlicas de alta graduacin (44 ml). Condimentos y otros alimentos Evite agregar cantidades excesivas de sal a los alimentos. Pruebe darles sabor con hierbas y especias en lugar de sal. Evite agregar azcar a los alimentos. Pruebe usar alios, salsas y productos untables a base de aceite en lugar de grasas slidas. Esta informacin se basa en las pautas generales de nutricin de los [place] Para obtener   ms informacin, visite choosemyplate.gov. Las cantidades exactas pueden variar en funcin de sus necesidades nutricionales. Resumen Un plan de alimentacin saludable puede ayudarlo a mantener un peso saludable, reducir el riesgo de tener enfermedades crnicas y mantenerse activo durante toda su vida. Planifique sus comidas. Asegrese de consumir las porciones correctas de una variedad de alimentos ricos en nutrientes. En lugar de frer, trate de cocinar en el horno, en la plancha o en la parrilla, o hervir los alimentos. Elija opciones saludables en todos los mbitos, como en el  hogar, el trabajo, la escuela, los restaurantes y las tiendas. Esta informacin no tiene como fin reemplazar el consejo del mdico. Asegrese de hacerle al mdico cualquier pregunta que tenga. Document Revised: 12/16/2020 Document Reviewed: 12/16/2020 Elsevier Patient Education  2023 Elsevier Inc.  

## 2022-05-29 NOTE — Progress Notes (Signed)
Patient ID: Jason [name], male    DOB: Mar 06, 1981  MRN: [id number]  CC: Follow-up (Alcoholic cirrhosis. Med refill. /Ins requesting for prescriptions to be in a 90 day supply. Neoma Laming received flu vax. )   Subjective: Jason [name] is a 42 y.o. male who presents for 2 mth f/u.  Geralynn Rile, is with him and interprets. His concerns today include:  Patient with history of severe ETOH use disorder in remission, alcohol induced cirrhosis, SBP, varices, pain/numbness left thigh on gabapentin   Cirrhosis: Seen in liver clinic last week.  He tells me that furosemide, potassium and pantoprazole were discontinued. Needing refill on lactulose, Pepcid, gabapentin, thiamine and folic acid. He has remained free of alcohol.  He has persistent low platelet count on CBC related to cirrhosis.  Reports occasional small epistaxis.  No other bleeding or bruising.  He also has history of portal hypertension.  Liver ultrasound was ordered on recent visit with the liver specialist for [facility] screening  Obesity: On last visit he was referred to nutritionist.  Patient states he received the appointment but subsequently forgot and missed his appointment.  He would like to be rescheduled.  He has been trying to get his weight down.  Drinking less juice and soda was an more water instead.  Does a lot of moving at work.  Works Systems developer.  Patient Active Problem List   Diagnosis Date Noted   Ruptured tympanic membrane, right 11/17/2021   Neuropathy of left lower extremity 11/17/2021   Abdominal pain 10/10/2021   Fever 10/10/2021   Pulmonary hypertension ([place]) 09/18/2021   Gastritis and gastroduodenitis    Hematuria 04/25/2021   Gynecomastia 04/25/2021   Hypertension 09/13/2020   Portal hypertensive gastropathy ([place])    Esophageal varices without bleeding ([place])    Former smoker 16/04/930   Alcoholic cirrhosis ([place]) 35/57/3220   Macrocytic anemia 05/01/2018    Hypokalemia 05/01/2018   Liver lesion, right lobe 05/01/2018   Liver failure ([place]) 05/01/2018   Liver failure without hepatic coma (HCC)    Alcohol use disorder, severe, in sustained remission ([place])    Thrombocytopenia ([place]) 03/04/2018     Current Outpatient Medications on File Prior to Visit  Medication Sig Dispense Refill   famotidine (PEPCID) 20 MG tablet Take 1 tablet (20 mg total) by mouth daily. 90 tablet 0   fluorometholone (FML) 0.1 % ophthalmic suspension Instill 1 drop into both eyes 4 times day for 2 weeks, then 1 drop 2 times day for 2 weeks, then stop 10 mL 0   folic acid (FOLVITE) 1 MG tablet Take 1 tablet (1 mg total) by mouth daily. 30 tablet 1   gabapentin (NEURONTIN) 300 MG capsule Take 1 capsule (300 mg total) by mouth 3 (three) times daily. 90 capsule 1   lactulose (CHRONULAC) 10 GM/15ML solution Take 30 mLs (20 g total) by mouth 2 (two) times daily. 1892 mL 0   Multiple Vitamin (MULTIVITAMIN WITH MINERALS) TABS tablet Take 1 tablet by mouth daily. 30 tablet 2   neomycin-polymyxin b-dexamethasone (MAXITROL) 3.5-10000-0.1 SUSP Use 5 drops in right ear 2 times daily for 14 days 5 mL 3   thiamine 100 MG tablet Take 1 tablet (100 mg total) by mouth daily. 30 tablet 5   bismuth subsalicylate (PEPTO BISMOL) 262 MG chewable tablet Chew 524 mg by mouth as needed for indigestion or diarrhea or loose stools. (Patient not taking: Reported on 05/29/2022)     furosemide (LASIX) 40 MG  tablet Take 1 tablet (40 mg total) by mouth daily. (Patient not taking: Reported on 05/29/2022) 90 tablet 0   pantoprazole (PROTONIX) 40 MG tablet Take 1 tablet (40 mg total) by mouth daily. (Patient not taking: Reported on 05/29/2022) 30 tablet 1   Potassium Chloride ER 20 MEQ TBCR Take 1 tablet  ( 20 mEq)  by mouth daily. (Patient not taking: Reported on 05/29/2022) 90 tablet 0   No current facility-administered medications on file prior to visit.    Allergies  Allergen Reactions   Acetaminophen Other  (See Comments)    Cirrhosis of the liver    Social History   Socioeconomic History   Marital status: Legally Separated    Spouse name: Not on file   Number of children: Not on file   Years of education: Not on file   Highest education level: Not on file  Occupational History   Occupation: Curator Engineer, materials)  Tobacco Use   Smoking status: Former   Smokeless tobacco: Never   Tobacco comments:    said he has tried it  Scientific laboratory technician Use: Never used  Substance and Sexual Activity   Alcohol use: Not Currently    Comment: 03/05/2018   Drug use: Never   Sexual activity: Yes  Other Topics Concern   Not on file  Social History Narrative   ** Merged History Encounter **       Lives in [place] with wife and daughter.    Social Determinants of Health   Financial Resource Strain: Not on file  Food Insecurity: Not on file  Transportation Needs: Not on file  Physical Activity: Not on file  Stress: Not on file  Social Connections: Not on file  Intimate Partner Violence: Not on file    Family History  Problem Relation Age of Onset   Colon cancer Neg Hx    Esophageal cancer Neg Hx    Healthy Mother    Healthy Daughter     Past Surgical History:  Procedure Laterality Date   BIOPSY  08/10/2019   Procedure: BIOPSY;  Surgeon: Lavena Bullion, DO;  Location: [facility];  Service: Gastroenterology;;   BIOPSY  02/08/2020   Procedure: BIOPSY;  Surgeon: Lavena Bullion, DO;  Location: [facility];  Service: Gastroenterology;;   BIOPSY  09/11/2021   Procedure: BIOPSY;  Surgeon: Lavena Bullion, DO;  Location: [facility];  Service: Gastroenterology;;   ESOPHAGEAL BANDING  08/10/2019   Procedure: ESOPHAGEAL BANDING;  Surgeon: Lavena Bullion, DO;  Location: [facility];  Service: Gastroenterology;;   ESOPHAGOGASTRODUODENOSCOPY (EGD) WITH PROPOFOL N/A 08/10/2019   Procedure: ESOPHAGOGASTRODUODENOSCOPY (EGD) WITH PROPOFOL;  Surgeon: Lavena Bullion, DO;   Location: [facility];  Service: Gastroenterology;  Laterality: N/A;   ESOPHAGOGASTRODUODENOSCOPY (EGD) WITH PROPOFOL N/A 02/08/2020   Procedure: ESOPHAGOGASTRODUODENOSCOPY (EGD) WITH PROPOFOL;  Surgeon: Lavena Bullion, DO;  Location: [facility];  Service: Gastroenterology;  Laterality: N/A;   ESOPHAGOGASTRODUODENOSCOPY (EGD) WITH PROPOFOL N/A 09/11/2021   Procedure: ESOPHAGOGASTRODUODENOSCOPY (EGD) WITH PROPOFOL;  Surgeon: Lavena Bullion, DO;  Location: [facility];  Service: Gastroenterology;  Laterality: N/A;   FOOT SURGERY Left    around 2014. a scar on lower leg   ORIF ANKLE FRACTURE Right 06/09/2019   Procedure: RIGHT OPEN REDUCTION INTERNAL FIXATION (ORIF) ANKLE FRACTURE;  Surgeon: Renette Butters, MD;  Location: [facility];  Service: Orthopedics;  Laterality: Right;    ROS: Review of Systems Negative except as stated above  PHYSICAL EXAM: BP 121/75 (  BP Location: Left Arm, Patient Position: Sitting, Cuff Size: Normal)   Pulse 66   Temp 98.3 F (36.8 C) (Oral)   Ht 5\' 5"  (1.651 m)   Wt 269 lb (122 kg)   SpO2 100%   BMI 44.76 kg/m   Wt Readings from Last 3 Encounters:  05/29/22 269 lb (122 kg)  03/27/22 265 lb (120.2 kg)  11/17/21 255 lb 9.6 oz (115.9 kg)    Physical Exam   General appearance - alert, well appearing, morbidly obese middle-age Hispanic male and in no distress Mental status - normal mood, behavior, speech, dress, motor activity, and thought processes Chest - clear to auscultation, no wheezes, rales or rhonchi, symmetric air entry Heart - normal rate, regular rhythm, normal S1, S2, no murmurs, rubs, clicks or gallops Extremities -trace lower extremity edema     Latest Ref Rng & Units 03/27/2022   12:22 PM 10/30/2021    9:11 AM 10/17/2021    3:30 PM  CMP  Glucose 70 - 99 mg/dL 95  10/19/2021  90   BUN 6 - 20 mg/dL 7  6  9    Creatinine 0.76 - 1.27 mg/dL 295   6.21   Sodium 134 - 144 mmol/L 146  141  138   Potassium 3.5 - 5.2  mmol/L 3.4  3.5  3.2   Chloride 96 - 106 mmol/L 109  109  107   CO2 20 - 29 mmol/L 22  20  17    Calcium 8.7 - 10.2 mg/dL 8.8  8.5  8.6   Total Protein 6.0 - 8.5 g/dL 6.4  6.6    Total Bilirubin 0.0 - 1.2 mg/dL 1.4  1.6    Alkaline Phos 44 - 121 IU/L 173  163    AST 0 - 40 IU/L 48  51    ALT 0 - 44 IU/L 35  32     Lipid Panel     Component Value Date/Time   CHOL 150 06/16/2020 1647   TRIG 65 06/16/2020 1647   HDL 85 06/16/2020 1647   CHOLHDL 1.8 06/16/2020 1647   LDLCALC 52 06/16/2020 1647    CBC    Component Value Date/Time   WBC 5.2 10/12/2021 0400   RBC 4.13 (L) 10/12/2021 0400   HGB 13.7 10/12/2021 0400   HGB 14.9 06/16/2020 1647   HCT 39.7 10/12/2021 0400   HCT 42.6 06/16/2020 1647   PLT 42 (L) 10/12/2021 0400   PLT 44 (LL) 06/16/2020 1647   MCV 96.1 10/12/2021 0400   MCV 95 06/16/2020 1647   MCH 33.2 10/12/2021 0400   MCHC 34.5 10/12/2021 0400   RDW 14.4 10/12/2021 0400   RDW 13.9 06/16/2020 1647   LYMPHSABS 1.7 10/12/2021 0400   LYMPHSABS 1.6 05/07/2019 1413   MONOABS 0.7 10/12/2021 0400   EOSABS 0.2 10/12/2021 0400   EOSABS 0.1 05/07/2019 1413   BASOSABS 0.0 10/12/2021 0400   BASOSABS 0.0 05/07/2019 1413    ASSESSMENT AND PLAN:  1. Portal hypertension (HCC) 2. Alcohol use disorder, severe, in sustained remission (HCC) Encouraged him to remain free of alcohol. He will continue follow-up with the liver clinic.  3. Thrombocytopenia ([facility]) Due to cirrhosis.  Continue to monitor.  He denies any bruising.  4. Morbid obesity with BMI of 40.0-44.9, adult [facility]) We will resubmit referral to nutritionist and screen for diabetes.  Dietary counseling given.  Printed information provided in Spanish as well. - Amb ref to Medical Nutrition Therapy-MNT - Hemoglobin A1c - Lipid panel  5. Alcoholic cirrhosis of liver without ascites ([place]) Followed by liver clinic. Medication list updated.  We will check BMP to make sure that his potassium level is stable since  furosemide and potassium supplement were discontinued last week by his liver specialist. - famotidine (PEPCID) 20 MG tablet; Take 1 tablet (20 mg total) by mouth daily.  Dispense: 90 tablet; Refill: 1 - folic acid (FOLVITE) 1 MG tablet; Take 1 tablet (1 mg total) by mouth daily.  Dispense: 90 tablet; Refill: 1 - thiamine (VITAMIN B1) 100 MG tablet; Take 1 tablet (100 mg total) by mouth daily.  Dispense: 90 tablet; Refill: 1 - Basic Metabolic Panel  6. Neuropathy of left lower extremity - gabapentin (NEURONTIN) 300 MG capsule; Take 1 capsule (300 mg total) by mouth 3 (three) times daily.  Dispense: 270 capsule; Refill: 1    Patient was given the opportunity to ask questions.  Patient verbalized understanding of the plan and was able to repeat key elements of the plan.   This documentation was completed using Radio producer.  Any transcriptional errors are unintentional.  No orders of the defined types were placed in this encounter.    Requested Prescriptions    No prescriptions requested or ordered in this encounter    No follow-ups on file.  Karle Plumber, MD, FACP

## 2022-05-30 LAB — BASIC METABOLIC PANEL
BUN/Creatinine Ratio: 12 (ref 9–20)
BUN: 7 mg/dL (ref 6–20)
CO2: 20 mmol/L (ref 20–29)
Calcium: 9.5 mg/dL (ref 8.7–10.2)
Chloride: 109 mmol/L — ABNORMAL HIGH (ref 96–106)
Creatinine, Ser: 0.58 mg/dL — ABNORMAL LOW (ref 0.76–1.27)
Glucose: 89 mg/dL (ref 70–99)
Potassium: 3.6 mmol/L (ref 3.5–5.2)
Sodium: 144 mmol/L (ref 134–144)
eGFR: 127 mL/min/{1.73_m2} (ref >59)

## 2022-05-30 LAB — LIPID PANEL
Chol/HDL Ratio: 1.8 ratio (ref 0.0–5.0)
Cholesterol, Total: 146 mg/dL (ref 100–199)
HDL: 83 mg/dL (ref >39)
LDL Chol Calc (NIH): 49 mg/dL (ref 0–99)
Triglycerides: 72 mg/dL (ref 0–149)
VLDL Cholesterol Cal: 14 mg/dL (ref 5–40)

## 2022-05-30 LAB — HEMOGLOBIN A1C
Est. average glucose Bld gHb Est-mCnc: 100 mg/dL
Hgb A1c MFr Bld: 5.1 % (ref 4.8–5.6)

## 2022-06-10 ENCOUNTER — Emergency Department (HOSPITAL_COMMUNITY)
Admission: EM | Admit: 2022-06-10 | Discharge: 2022-06-11 | Disposition: A | Discharge status: Home / Self Care | Payer: 59 | Attending: Emergency Medicine | Admitting: Emergency Medicine

## 2022-06-10 ENCOUNTER — Encounter (HOSPITAL_COMMUNITY): Payer: Self-pay

## 2022-06-10 ENCOUNTER — Other Ambulatory Visit: Payer: Self-pay

## 2022-06-10 DIAGNOSIS — J101 Influenza due to other identified influenza virus with other respiratory manifestations: Secondary | ICD-10-CM | POA: Diagnosis not present

## 2022-06-10 DIAGNOSIS — Z20822 Contact with and (suspected) exposure to covid-19: Secondary | ICD-10-CM | POA: Diagnosis not present

## 2022-06-10 DIAGNOSIS — I10 Essential (primary) hypertension: Secondary | ICD-10-CM | POA: Insufficient documentation

## 2022-06-10 DIAGNOSIS — Z87891 Personal history of nicotine dependence: Secondary | ICD-10-CM | POA: Insufficient documentation

## 2022-06-10 DIAGNOSIS — R519 Headache, unspecified: Secondary | ICD-10-CM | POA: Diagnosis not present

## 2022-06-10 LAB — RESP PANEL BY RT-PCR (RSV, FLU A&B, COVID)  RVPGX2
Influenza A by PCR: NEGATIVE
Influenza B by PCR: POSITIVE — AB
Resp Syncytial Virus by PCR: NEGATIVE
SARS Coronavirus 2 by RT PCR: NEGATIVE

## 2022-06-10 NOTE — ED Triage Notes (Signed)
States that he has had nausea, headache, body aches, and a running nose

## 2022-06-11 ENCOUNTER — Other Ambulatory Visit: Payer: Self-pay

## 2022-06-11 MED ORDER — NAPROXEN 375 MG PO TABS
ORAL_TABLET | ORAL | 0 refills | Status: DC
  Filled 2022-06-11: qty 20, 10d supply, fill #0

## 2022-06-11 MED ORDER — NAPROXEN 500 MG PO TABS
500.0000 mg | ORAL_TABLET | Freq: Once | ORAL | Status: AC
  Administered 2022-06-11: 500 mg via ORAL
  Filled 2022-06-11: qty 1

## 2022-06-11 MED ORDER — ONDANSETRON 8 MG PO TBDP
8.0000 mg | ORAL_TABLET | Freq: Three times a day (TID) | ORAL | 0 refills | Status: DC | PRN
  Filled 2022-06-11: qty 10, 4d supply, fill #0

## 2022-06-11 MED ORDER — ONDANSETRON 8 MG PO TBDP
8.0000 mg | ORAL_TABLET | Freq: Once | ORAL | Status: AC
  Administered 2022-06-11: 8 mg via ORAL
  Filled 2022-06-11: qty 1

## 2022-06-11 NOTE — ED Provider Notes (Signed)
[facility] DEPT Provider Note: Georgena Spurling, MD, FACEP  CSN: [id number] MRN: [id number] ARRIVAL: 06/10/22 at [place]: [id number]/WA17   CHIEF COMPLAINT  Flu-Like Symptoms   HISTORY OF PRESENT ILLNESS  06/11/22 1:50 AM Jason [name] is a 42 y.o. male with flulike symptoms for the past 3 days.  Specifically he has had bodyaches, headache, nasal congestion, rhinorrhea and general malaise.  Yesterday he developed a headache and nausea but no vomiting or diarrhea.   Past Medical History:  Diagnosis Date   Alcoholic cirrhosis of liver ([place])    Alcoholic cirrhosis of liver with ascites ([place]) 06/29/2017   History of heavy, daily drinking. No alcohol since 05/2017. Started treatment 05/2017 with lactulose, thiamine, MVI Spironolactone + furosemide initiated 06/2017.   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 He is currently on lactulose for questionable history of [place] encephalopathy.  No evidence of encephalopathy at today's visit.  Reviewed signs/symptoms of encephalopathy and indication to contact our office.   GERD (gastroesophageal reflux disease)    Hypertension    Nocturia    SBP (spontaneous bacterial peritonitis) ([place]) 05/01/2018   Thrombocytopenia ([place])     Past Surgical History:  Procedure Laterality Date   BIOPSY  08/10/2019   Procedure: BIOPSY;  Surgeon: Lavena Bullion, DO;  Location: [facility];  Service: Gastroenterology;;   BIOPSY  02/08/2020   Procedure: BIOPSY;  Surgeon: Lavena Bullion, DO;  Location: [facility];  Service: Gastroenterology;;   BIOPSY  09/11/2021   Procedure: BIOPSY;  Surgeon: Lavena Bullion, DO;  Location: [facility];  Service: Gastroenterology;;   ESOPHAGEAL BANDING  08/10/2019   Procedure: ESOPHAGEAL BANDING;  Surgeon: Lavena Bullion,  DO;  Location: [facility];  Service: Gastroenterology;;   ESOPHAGOGASTRODUODENOSCOPY (EGD) WITH PROPOFOL N/A 08/10/2019   Procedure: ESOPHAGOGASTRODUODENOSCOPY (EGD) WITH PROPOFOL;  Surgeon: Lavena Bullion, DO;  Location: [facility];  Service: Gastroenterology;  Laterality: N/A;   ESOPHAGOGASTRODUODENOSCOPY (EGD) WITH PROPOFOL N/A 02/08/2020   Procedure: ESOPHAGOGASTRODUODENOSCOPY (EGD) WITH PROPOFOL;  Surgeon: Lavena Bullion, DO;  [facility]: [facility];  Service: Gastroenterology;  Laterality: N/A;   ESOPHAGOGASTRODUODENOSCOPY (EGD) WITH PROPOFOL N/A 09/11/2021   Procedure: ESOPHAGOGASTRODUODENOSCOPY (EGD) WITH PROPOFOL;  Surgeon: Lavena Bullion, DO;  Location: [facility];  Service: Gastroenterology;  Laterality: N/A;   FOOT SURGERY Left    around 2014. a scar on lower leg   ORIF ANKLE FRACTURE Right 06/09/2019   Procedure: RIGHT OPEN REDUCTION INTERNAL FIXATION (ORIF) ANKLE FRACTURE;  Surgeon: Renette Butters, MD;  Location: [facility];  Service: Orthopedics;  Laterality: Right;    Family History  Problem Relation Age of Onset   Colon cancer Neg Hx    Esophageal cancer Neg Hx    Healthy Mother    Healthy Daughter     Social History   Tobacco Use   Smoking status: Former   Smokeless tobacco: Never   Tobacco comments:    said he has tried it  Scientific laboratory technician Use: Never used  Substance Use Topics   Alcohol use: Not Currently    Comment: 03/05/2018   Drug use: Never    Prior to Admission medications   Medication Sig Start Date End Date Taking? Authorizing Provider  naproxen (NAPROSYN) 375 MG tablet Take  1 tablet twice daily as needed for body aches or headache. 06/11/22  Yes Jacqulin Brandenburger, MD  ondansetron (ZOFRAN-ODT) 8 MG disintegrating tablet Take 1 tablet (8 mg total) by mouth every 8 (eight) hours as needed for nausea or vomiting. 06/11/22  Yes Dajahnae Vondra, MD  famotidine (PEPCID) 20 MG tablet Take 1 tablet (20 mg total) by mouth daily.  05/29/22   Ladell Pier, MD  fluorometholone (FML) 0.1 % ophthalmic suspension Instill 1 drop into both eyes 4 times day for 2 weeks, then 1 drop 2 times day for 2 weeks, then stop [id number]     folic acid (FOLVITE) 1 MG tablet Take 1 tablet (1 mg total) by mouth daily. 05/29/22   Ladell Pier, MD  gabapentin (NEURONTIN) 300 MG capsule Take 1 capsule (300 mg total) by mouth 3 (three) times daily. 05/29/22   Ladell Pier, MD  lactulose (CHRONULAC) 10 GM/15ML solution Take 30 mLs (20 g total) by mouth 2 (two) times daily. 05/29/22   Ladell Pier, MD  thiamine (VITAMIN B1) 100 MG tablet Take 1 tablet (100 mg total) by mouth daily. 05/29/22   Ladell Pier, MD    Allergies Acetaminophen   REVIEW OF SYSTEMS  Negative except as noted here or in the History of Present Illness.   PHYSICAL EXAMINATION  Initial Vital Signs Blood pressure (!) 152/90, pulse 89, temperature 98.8 F (37.1 C), temperature source Oral, resp. rate 18, height 5' 5"$  (1.651 m), weight 122.5 kg, SpO2 100 %.  Examination General: Well-developed, well-nourished male in no acute distress; appearance consistent with age of record HENT: normocephalic; atraumatic Eyes: Right medial conjunctival injection Neck: supple Heart: regular rate and rhythm Lungs: clear to auscultation bilaterally Abdomen: soft; nondistended; nontender; bowel sounds present Extremities: No deformity; full range of motion; pulses normal Neurologic: Awake, alert and oriented; motor function intact in all extremities and symmetric; no facial droop Skin: Warm and dry Psychiatric: Normal mood and affect   RESULTS  Summary of this visit's results, reviewed and interpreted by myself:   EKG Interpretation  Date/Time:    Ventricular Rate:    PR Interval:    QRS Duration:   QT Interval:    QTC Calculation:   R Axis:     Text Interpretation:         Laboratory Studies: Results for orders placed or performed during the  hospital encounter of 06/10/22 (from the past 24 hour(s))  Resp panel by RT-PCR (RSV, Flu A&B, Covid) Anterior Nasal Swab     Status: Abnormal   Collection Time: 06/10/22 11:15 PM   Specimen: Anterior Nasal Swab  Result Value Ref Range   SARS Coronavirus 2 by RT PCR NEGATIVE NEGATIVE   Influenza A by PCR NEGATIVE NEGATIVE   Influenza B by PCR POSITIVE (A) NEGATIVE   Resp Syncytial Virus by PCR NEGATIVE NEGATIVE   Imaging Studies: No results found.  ED COURSE and MDM  Nursing notes, initial and subsequent vitals signs, including pulse oximetry, reviewed and interpreted by myself.  Vitals:   06/10/22 2309 06/10/22 2310  BP: (!) 152/90   Pulse: 89   Resp: 18   Temp: 98.8 F (37.1 C)   TempSrc: Oral   SpO2: 100%   Weight:  122.5 kg  Height:  5' 5"$  (1.651 m)   Medications  ondansetron (ZOFRAN-ODT) disintegrating tablet 8 mg (has no administration in time range)  naproxen (NAPROSYN) tablet 500 mg (has no administration in time range)    Patient positive  for influenza B.  He is outside the window for Tamiflu.  We will treat his nausea with Zofran and his headache and bodyaches with naproxen.  PROCEDURES  Procedures   ED DIAGNOSES     ICD-10-CM   1. Influenza B  J10.1          Avagrace Botelho, MD 06/11/22 0200

## 2022-06-13 ENCOUNTER — Ambulatory Visit
Admission: RE | Admit: 2022-06-13 | Discharge: 2022-06-13 | Disposition: A | Discharge status: Home / Self Care | Payer: Self-pay | Source: Ambulatory Visit | Attending: Nurse Practitioner | Admitting: Nurse Practitioner

## 2022-06-13 DIAGNOSIS — K7689 Other specified diseases of liver: Secondary | ICD-10-CM | POA: Diagnosis not present

## 2022-06-13 DIAGNOSIS — K7031 Alcoholic cirrhosis of liver with ascites: Secondary | ICD-10-CM

## 2022-06-13 DIAGNOSIS — R1011 Right upper quadrant pain: Secondary | ICD-10-CM | POA: Diagnosis not present

## 2022-07-02 ENCOUNTER — Encounter: Payer: Self-pay | Admitting: Skilled Nursing Facility1

## 2022-07-02 ENCOUNTER — Encounter: Payer: 59 | Attending: Internal Medicine | Admitting: Skilled Nursing Facility1

## 2022-07-02 DIAGNOSIS — Z6841 Body Mass Index (BMI) 40.0 and over, adult: Secondary | ICD-10-CM | POA: Insufficient documentation

## 2022-07-02 DIAGNOSIS — Z713 Dietary counseling and surveillance: Secondary | ICD-10-CM | POA: Diagnosis not present

## 2022-07-02 DIAGNOSIS — E669 Obesity, unspecified: Secondary | ICD-10-CM

## 2022-07-02 NOTE — Progress Notes (Signed)
Medical Nutrition Therapy  Appointment Start time:  [id number]  Appointment End time:  1548  Primary concerns today: Losing weight  Referral diagnosis: E66.01  NUTRITION ASSESSMENT   Interpretor: Vergia Alcon  Clinical Medical Hx: Obesity, Cirrhosis, ETOH, Esophageal varices, neuropathy, anemia   Medications: Famotidine, Folic Acid, lactulose,thiamine  Labs: Creatinine 0.58  Chloride 109  05/29/2022  Notable Signs/Symptoms: Not reported   Lifestyle & Dietary Hx  Pt arrives with his seemingly supportive son in low who is also working on reducing his fatty liver. 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 Pt states he avoids salt due to his liver. Patient states he doesn't have energy to exercise after work, and he can not walk because his legs hurt, but he will try his best to do some exercise . Patient wants to be sure that his MD will be notified that he came to this appointment stating he does not want them to prescribe medication.  Estimated daily fluid intake: oz Supplements:  Sleep:  Stress / self-care:  Current average weekly physical activity: construction work   24-Hr Dietary Recall  First Meal   Bread, coffee, juice homemade, taco Snack: none Second Meal: Leftover, meat, chicken, bean, rice,6 - 7 tortillas Snack:  Third Meal:  meat, chicken, bean, rice 6 - 7 tortillas Snack: Milk 2% 6 oz Beverages: Soda 4 can 6 oz, water, juice homemade    NUTRITION DIAGNOSIS  NB-1.1 Food and nutrition-related knowledge deficit As related to no prior education from a Brewing technologist .  As evidenced by 24 hours recall and new nutrition referral.   NUTRITION INTERVENTION  Nutrition education (E-1) on the following topics:  Educated on Myplate,  Educated patient on physical activity recommendations. Educated patient on eating through the day. Educated patient on avoiding high fat food and reducing  deep fry food.  Handouts Provided Include  Myplate for diabetes in Spanish  Learning Style & Readiness for Change Teaching method utilized: Visual & Auditory  Demonstrated degree of understanding via: Teach Back  Barriers to learning/adherence to lifestyle change: His work schedule.   Goals Established by Pt -Eat more broccoli and vegetables. -Try to exercise  -Reduce the number of tortillas   MONITORING & EVALUATION Dietary intake, weekly physical activity,   Next Steps  Patient is to: Patient states he call back to make his next appointment

## 2022-07-25 ENCOUNTER — Other Ambulatory Visit: Payer: Self-pay

## 2022-07-27 ENCOUNTER — Ambulatory Visit: Payer: Commercial Managed Care - HMO | Admitting: Internal Medicine

## 2022-08-13 ENCOUNTER — Other Ambulatory Visit: Payer: Self-pay

## 2022-08-13 MED ORDER — PREDNISOLONE ACETATE 1 % OP SUSP
1.0000 [drp] | Freq: Four times a day (QID) | OPHTHALMIC | 0 refills | Status: DC
  Filled 2022-08-13: qty 5, 25d supply, fill #0

## 2022-08-27 ENCOUNTER — Other Ambulatory Visit: Payer: Self-pay

## 2022-08-27 MED ORDER — CYCLOSPORINE 0.05 % OP EMUL
1.0000 [drp] | Freq: Two times a day (BID) | OPHTHALMIC | 3 refills | Status: DC
  Filled 2022-08-27 – 2022-09-06 (×3): qty 180, 90d supply, fill #0

## 2022-08-27 MED ORDER — FLUOROMETHOLONE 0.1 % OP SUSP
OPHTHALMIC | 0 refills | Status: AC
  Filled 2022-08-27 – 2022-09-06 (×2): qty 10, 28d supply, fill #0

## 2022-08-29 ENCOUNTER — Other Ambulatory Visit: Payer: Self-pay

## 2022-08-30 ENCOUNTER — Other Ambulatory Visit: Payer: Self-pay

## 2022-08-30 MED ORDER — LOTEPREDNOL ETABONATE 0.5 % OP SUSP
OPHTHALMIC | 0 refills | Status: AC
  Filled 2022-08-30 (×2): qty 5, 28d supply, fill #0
  Filled 2022-08-31: qty 10, 28d supply, fill #0

## 2022-08-31 ENCOUNTER — Other Ambulatory Visit: Payer: Self-pay

## 2022-09-03 ENCOUNTER — Other Ambulatory Visit: Payer: Self-pay

## 2022-09-05 ENCOUNTER — Other Ambulatory Visit: Payer: Self-pay

## 2022-09-06 ENCOUNTER — Other Ambulatory Visit: Payer: Self-pay

## 2022-09-07 ENCOUNTER — Other Ambulatory Visit: Payer: Self-pay

## 2022-09-13 ENCOUNTER — Other Ambulatory Visit: Payer: Self-pay

## 2022-09-27 ENCOUNTER — Other Ambulatory Visit: Payer: Self-pay

## 2022-09-28 ENCOUNTER — Ambulatory Visit: Payer: 59 | Attending: Internal Medicine | Admitting: Internal Medicine

## 2022-09-28 ENCOUNTER — Encounter: Payer: Self-pay | Admitting: Internal Medicine

## 2022-09-28 VITALS — BP 112/68 | HR 63 | Ht 65.0 in | Wt 259.6 lb

## 2022-09-28 DIAGNOSIS — H1131 Conjunctival hemorrhage, right eye: Secondary | ICD-10-CM

## 2022-09-28 DIAGNOSIS — Z6841 Body Mass Index (BMI) 40.0 and over, adult: Secondary | ICD-10-CM

## 2022-09-28 DIAGNOSIS — K7030 Alcoholic cirrhosis of liver without ascites: Secondary | ICD-10-CM

## 2022-09-28 NOTE — Progress Notes (Signed)
Patient ID: Jason [name], male    DOB: Oct 15, 1980  MRN: [id number]  CC: obesity/redness in RT eye  Subjective: Jason [name] is a 42 y.o. male who presents for chronic ds management His concerns today include:  Patient with history of severe ETOH use disorder in remission, alcohol induced cirrhosis, SBP, varices, pain/numbness left thigh on gabapentin   [employer] Language interpreter used during this encounter. #[id number], Ernesto  Pt c/o blood in RT eye x 6 days.  First noticed by his daughter and was not painful. Felt like something was in his eye and had rub the eye hard prior to his daughter noticing the redness. Some intermittent pain but not now.  Blurred vision sometimes if he tries to focus on one thing. Made appt with eye doctor but not until 10/15/2022.  He is to call them back to request an appointment much sooner.  He awaits their reply.   Alcoholic cirrhosis: He remains free of alcohol. Since last visit with me, he had liver ultrasound 05/2022 through liver clinic.  This revealed changes consistent with cirrhosis and a 7 mm gallbladder polyp.  Radiologist recommended follow-up ultrasound in 6 months to make sure that this remains stable in size.  Obesity: He has seen the nutritionist since last visit with me.  Found it helpful. He is down 10 pounds since last visit in January.  Not getting in as much exercise as he would like because he works all the time.  However he is pleased with the 10 pounds that he is lost so far.   Patient Active Problem List   Diagnosis Date Noted   Ruptured tympanic membrane, right 11/17/2021   Neuropathy of left lower extremity 11/17/2021   Abdominal pain 10/10/2021   Fever 10/10/2021   Gastritis and gastroduodenitis    Hematuria 04/25/2021   Gynecomastia 04/25/2021   Alcohol use disorder, severe, in sustained remission (HCC) 09/13/2020   Hypertension 09/13/2020   Portal hypertensive gastropathy (HCC)    Esophageal  varices without bleeding (HCC)    Former smoker 06/10/2018   Alcoholic cirrhosis (HCC) 05/01/2018   Macrocytic anemia 05/01/2018   Hypokalemia 05/01/2018   Liver lesion, right lobe 05/01/2018   Liver failure (HCC) 05/01/2018   Liver failure without hepatic coma (HCC)    Thrombocytopenia (HCC) 03/04/2018   Alcoholic cirrhosis of liver with ascites (HCC) 06/29/2017     Current Outpatient Medications on File Prior to Visit  Medication Sig Dispense Refill   cycloSPORINE (RESTASIS) 0.05 % ophthalmic emulsion Instill 1 drop into both eyes twice a day 180 each 3   famotidine (PEPCID) 20 MG tablet Take 1 tablet (20 mg total) by mouth daily. 90 tablet 1   fluorometholone (FML) 0.1 % ophthalmic suspension Instill 1 drop into both eyes 4 times day for 2 weeks, then 1 drop 2 times day for 2 weeks, then stop 10 mL 0   folic acid (FOLVITE) 1 MG tablet Take 1 tablet (1 mg total) by mouth daily. 90 tablet 1   gabapentin (NEURONTIN) 300 MG capsule Take 1 capsule (300 mg total) by mouth 3 (three) times daily. 270 capsule 1   lactulose (CHRONULAC) 10 GM/15ML solution Take 30 mLs (20 g total) by mouth 2 (two) times daily. 1892 mL 2   loteprednol (LOTEMAX) 0.5 % ophthalmic suspension Instill 1 drop into both eyes 4 (four) times daily for 14 days, THEN 1 drop 2 (two) times daily for 14 days then stop. 10 mL 0   naproxen (  NAPROSYN) 375 MG tablet Take 1 tablet by mouth twice daily as needed for body aches or headache. 20 tablet 0   ondansetron (ZOFRAN-ODT) 8 MG disintegrating tablet Take 1 tablet (8 mg total) by mouth every 8 (eight) hours as needed for nausea or vomiting. 10 tablet 0   prednisoLONE acetate (PRED FORTE) 1 % ophthalmic suspension Place 1 drop into the right eye 4 (four) times daily. 5 mL 0   thiamine (VITAMIN B1) 100 MG tablet Take 1 tablet (100 mg total) by mouth daily. 100 tablet 1   No current facility-administered medications on file prior to visit.    Allergies  Allergen Reactions    Acetaminophen Other (See Comments)    Cirrhosis of the liver    Social History   Socioeconomic History   Marital status: Legally Separated    Spouse name: Not on file   Number of children: Not on file   Years of education: Not on file   Highest education level: Not on file  Occupational History   Occupation: Education administrator Conservation officer, historic buildings)  Tobacco Use   Smoking status: Former   Smokeless tobacco: Never   Tobacco comments:    said he has tried it  Building services engineer Use: Never used  Substance and Sexual Activity   Alcohol use: Not Currently    Comment: 03/05/2018   Drug use: Never   Sexual activity: Yes  Other Topics Concern   Not on file  Social History Narrative   ** Merged History Encounter **       Lives in [place] with wife and daughter.    Social Determinants of Health   Financial Resource Strain: Not on file  Food Insecurity: Not on file  Transportation Needs: Not on file  Physical Activity: Not on file  Stress: Not on file  Social Connections: Not on file  Intimate Partner Violence: Not on file    Family History  Problem Relation Age of Onset   Colon cancer Neg Hx    Esophageal cancer Neg Hx    Healthy Mother    Healthy Daughter     Past Surgical History:  Procedure Laterality Date   BIOPSY  08/10/2019   Procedure: BIOPSY;  Surgeon: Shellia Cleverly, DO;  [facility];  Service: Gastroenterology;;   BIOPSY  02/08/2020   Procedure: BIOPSY;  Surgeon: Shellia Cleverly, DO;  Location: [facility];  Service: Gastroenterology;;   BIOPSY  09/11/2021   Procedure: BIOPSY;  Surgeon: Shellia Cleverly, DO;  Location: [facility];  Service: Gastroenterology;;   ESOPHAGEAL BANDING  08/10/2019   Procedure: ESOPHAGEAL BANDING;  Surgeon: Shellia Cleverly, DO;  Location: [facility];  Service: Gastroenterology;;   ESOPHAGOGASTRODUODENOSCOPY (EGD) WITH PROPOFOL N/A 08/10/2019   Procedure: ESOPHAGOGASTRODUODENOSCOPY (EGD) WITH PROPOFOL;  Surgeon:  Shellia Cleverly, DO;  [facility];  Service: Gastroenterology;  Laterality: N/A;   ESOPHAGOGASTRODUODENOSCOPY (EGD) WITH PROPOFOL N/A 02/08/2020   Procedure: ESOPHAGOGASTRODUODENOSCOPY (EGD) WITH PROPOFOL;  Surgeon: Shellia Cleverly, DO;  [facility];  Service: Gastroenterology;  Laterality: N/A;   ESOPHAGOGASTRODUODENOSCOPY (EGD) WITH PROPOFOL N/A 09/11/2021   Procedure: ESOPHAGOGASTRODUODENOSCOPY (EGD) WITH PROPOFOL;  Surgeon: Shellia Cleverly, DO;  Location: [facility];  Service: Gastroenterology;  Laterality: N/A;   FOOT SURGERY Left    around 2014. a scar on lower leg   ORIF ANKLE FRACTURE Right 06/09/2019   Procedure: RIGHT OPEN REDUCTION INTERNAL FIXATION (ORIF) ANKLE FRACTURE;  Surgeon: Sheral Apley, MD;  Location: [facility];  Service: Orthopedics;  Laterality: Right;    ROS: Review of Systems Negative except as stated above  PHYSICAL EXAM: BP 112/68 (BP Location: Left Arm, Patient Position: Sitting, Cuff Size: Large)   Pulse 63   Ht 5\' 5"  (1.651 m)   Wt 259 lb 9.6 oz (117.8 kg)   SpO2 98%   BMI 43.20 kg/m   Wt Readings from Last 3 Encounters:  09/28/22 259 lb 9.6 oz (117.8 kg)  06/10/22 270 lb (122.5 kg)  05/29/22 269 lb (122 kg)    Physical Exam   General appearance - alert, well appearing, and in no distress Mental status - normal mood, behavior, speech, dress, motor activity, and thought processes Eyes -patient with mild some conjunctival hemorrhage noted in the medial aspect of the right eye.  Looks like he also has a pterygium on the medial aspect of the eye.  No foreign body noted.  Pupils equal and reactive in both eyes.  Extraocular movement intact both eyes. Heart - normal rate, regular rhythm, normal S1, S2, no murmurs, rubs, clicks or gallops Abdomen -obese.  Normal bowel sounds. Extremities -no lower extremity edema.     Latest Ref Rng & Units 05/29/2022   11:55 AM 03/27/2022   12:22 PM 10/30/2021    9:11  AM  CMP  Glucose 70 - 99 mg/dL 89  95  119   BUN 6 - 20 mg/dL 7  7  6    Creatinine 0.76 - 1.27 mg/dL 1.47  8.29  5.62   Sodium 134 - 144 mmol/L 144  146  141   Potassium 3.5 - 5.2 mmol/L 3.6  3.4  3.5   Chloride 96 - 106 mmol/L 109  109  109   CO2 20 - 29 mmol/L 20  22  20    Calcium 8.7 - 10.2 mg/dL 9.5  8.8  8.5   Total Protein 6.0 - 8.5 g/dL  6.4  6.6   Total Bilirubin 0.0 - 1.2 mg/dL  1.4  1.6   Alkaline Phos 44 - 121 IU/L  173  163   AST 0 - 40 IU/L  48  51   ALT 0 - 44 IU/L  35  32    Lipid Panel     Component Value Date/Time   CHOL 146 05/29/2022 1155   TRIG 72 05/29/2022 1155   HDL 83 05/29/2022 1155   CHOLHDL 1.8 05/29/2022 1155   LDLCALC 49 05/29/2022 1155    CBC    Component Value Date/Time   WBC 5.2 10/12/2021 0400   RBC 4.13 (L) 10/12/2021 0400   HGB 13.7 10/12/2021 0400   HGB 14.9 06/16/2020 1647   HCT 39.7 10/12/2021 0400   HCT 42.6 06/16/2020 1647   PLT 42 (L) 10/12/2021 0400   PLT 44 (LL) 06/16/2020 1647   MCV 96.1 10/12/2021 0400   MCV 95 06/16/2020 1647   MCH 33.2 10/12/2021 0400   MCHC 34.5 10/12/2021 0400   RDW 14.4 10/12/2021 0400   RDW 13.9 06/16/2020 1647   LYMPHSABS 1.7 10/12/2021 0400   LYMPHSABS 1.6 05/07/2019 1413   MONOABS 0.7 10/12/2021 0400   EOSABS 0.2 10/12/2021 0400   EOSABS 0.1 05/07/2019 1413   BASOSABS 0.0 10/12/2021 0400   BASOSABS 0.0 05/07/2019 1413    ASSESSMENT AND PLAN:  1. Morbid obesity with BMI of 40.0-44.9, adult (HCC) Commended him on weight loss so far.  Encouraged him to keep up the good work to put in practice what the dietitian has taught him.  Try to move is much as he can throughout his day.  2. Alcoholic cirrhosis of liver without ascites (HCC) Encouraged him to remain free of alcohol.  He is followed by liver clinic  - PT AND PTT - CBC  3. Subconjunctival hemorrhage of right eye Most likely occurred when he rubbed his eyes.  Advised that this usually resolves on its own.  He can try applying warm  compresses.  Advised that if he has any increased pain or blurred vision he certainly should try to get in with the eye doctor much sooner.  Given that he has cirrhosis, we will check PT/PTT and CBC. - PT AND PTT - CBC    Patient was given the opportunity to ask questions.  Patient verbalized understanding of the plan and was able to repeat key elements of the plan.   This documentation was completed using Paediatric nurse.  Any transcriptional errors are unintentional.  Orders Placed This Encounter  Procedures   PT AND PTT   CBC     Requested Prescriptions    No prescriptions requested or ordered in this encounter    Return in about 4 months (around 01/28/2023).  Jonah Blue, MD, FACP

## 2022-09-28 NOTE — Progress Notes (Signed)
Patient states that there is a blood vessel that has burst in his right eye that is causing him pain.

## 2022-09-28 NOTE — Patient Instructions (Signed)
Hemorragia subconjuntival Subconjunctival Hemorrhage La hemorragia subconjuntival es el sangrado que se produce entre la parte blanca del ojo (esclertica) y la membrana transparente que recubre la parte externa de este rgano (conjuntiva). Cerca de la superficie del ojo hay muchos vasos sanguneos diminutos. La hemorragia subconjuntival ocurre cuando uno o ms de estos vasos sanguneos se rompen y Water quality scientist, lo que deriva en la aparicin de una mancha roja en el ojo. Esta es similar a Comptroller. En funcin de la magnitud del sangrado, la mancha roja puede cubrir nicamente una pequea zona del ojo o la totalidad de la parte visible de Leisure centre manager. Si se acumula mucha sangre debajo de la conjuntiva, tambin puede haber inflamacin. Las hemorragias subconjuntivales no afectan la visin ni Teaching laboratory technician, pero puede haber sensacin de irritacin ocular si hay inflamacin. En general, las hemorragias subconjuntivales no requieren tratamiento y, [place], desaparecen solas en el trmino de dos a cuatro semanas. Cules son las causas? Esta afeccin puede ser causada por lo siguiente: Un traumatismo leve, como frotarse los ojos con mucha fuerza. Lesiones por contusiones, como por ejemplo practicar deportes o tener contacto con un airbag desplegado. Toser, estornudar o vomitar. Realizar esfuerzos, como ocurre al levantar un objeto pesado. Trastornos mdicos tales como: Presin arterial alta. Diabetes. Cirugas recientes del ojo. Algunos medicamentos, especialmente los anticoagulantes, incluida la aspirina. Otras afecciones, como los tumores en los ojos, los trastornos hemorrgicos o las anomalas de los vasos sanguneos. Las hemorragias subconjuntivales tambin pueden producirse sin una causa aparente. Cules son los signos o sntomas? Los sntomas de esta afeccin incluyen: Una mancha de color rojo oscuro o brillante en la parte blanca del ojo. La zona roja puede: Extenderse hasta cubrir una zona  ms grande del ojo antes de Geneticist, molecular. Cambiar de color, como rosa o amarillo amarronado, antes de Geneticist, molecular. Hinchazn alrededor del ojo. Irritacin leve del ojo. Cmo se diagnostica? Esta afeccin se diagnostica mediante un examen fsico. Si la hemorragia subconjuntival fue causada por un traumatismo, el mdico puede derivarlo a un oculista (oftalmlogo) o a Dietitian para que lo examinen en busca de otras lesiones. Pueden hacerle otras pruebas, por ejemplo: Un examen ocular que incluye una prueba de visin, una revisin ocular con un tipo de microscopio (lmpara de hendidura) y la medicin de la presin ocular. Es posible que se le dilate el ojo, especialmente si la hemorragia subconjuntival fue causada por un traumatismo. Un control de la presin arterial. Anlisis de sangre para detectar la presencia de trastornos hemorrgicos. Si la hemorragia subconjuntival fue causada por un traumatismo, pueden hacerle radiografas o una exploracin por tomografa computarizada (TC) para determinar si hay otras lesiones. Cmo se trata? Generalmente, no se requiere un tratamiento para esta afeccin. Si tiene molestias, el mdico puede recomendarle que se aplique gotas oftlmicas o compresas fras. Siga estas instrucciones en su casa: Tome los medicamentos de venta libre y los recetados solamente como se lo haya indicado el mdico. Aplique las gotas oftlmicas o las compresas fras para Paramedic las molestias como se lo haya indicado el mdico. Evite las [place], las cosas y los entornos que pueden causarle irritacin o lesiones en el ojo. Concurra a todas las visitas de seguimiento. Esto es importante. Comunquese con un mdico si: Siente dolor en el ojo. El sangrado no desaparece en el trmino de 4 semanas. Sigue teniendo nuevas hemorragias subconjuntivales. Solicite ayuda de inmediato si: Tiene cambios en la visin, dificultad para ver o visin doble. Repentinamente, tiene mucha  sensibilidad a la luz. Tiene dolor  de cabeza intenso, vmitos persistentes, confusin o un cansancio que no es normal (letargo). Parece que el ojo sobresale o se protruye de la rbita. Le aparecen moretones en el cuerpo sin motivo. Tiene sangrado en otra parte del cuerpo sin motivo. Estos sntomas pueden representar un problema grave que constituye Radio broadcast assistant. No espere a ver si los sntomas desaparecen. Solicite atencin mdica de inmediato. Comunquese con el servicio de emergencias de su localidad (911 en los [place]). No conduzca por sus propios medios [employer] [facility]. Resumen La hemorragia subconjuntival es el sangrado que se produce entre la parte blanca del ojo y la membrana transparente que recubre la parte externa de este rgano. Esta afeccin es similar a un moretn. En general, las hemorragias subconjuntivales no requieren tratamiento y, [place], desaparecen solas en el trmino de dos a cuatro semanas. Aplique las gotas oftlmicas o las compresas fras para Paramedic las [employer] se lo haya indicado el mdico. Esta informacin no tiene Theme park manager el consejo del mdico. Asegrese de hacerle al mdico cualquier pregunta que tenga. Document Revised: 07/19/2020 Document Reviewed: 07/19/2020 Elsevier Patient Education  2024 [employer].

## 2022-10-01 LAB — CBC
Hematocrit: 42.3 % (ref 37.5–51.0)
Hemoglobin: 14.4 g/dL (ref 13.0–17.7)
MCH: 30.9 pg (ref 26.6–33.0)
MCHC: 34 g/dL (ref 31.5–35.7)
MCV: 91 fL (ref 79–97)
Platelets: 65 10*3/uL — CL (ref 150–450)
RBC: 4.66 x10E6/uL (ref 4.14–5.80)
RDW: 13.3 % (ref 11.6–15.4)
WBC: 5.8 10*3/uL (ref 3.4–10.8)

## 2022-10-01 LAB — PT AND PTT
INR: 1.1 (ref 0.9–1.2)
Prothrombin Time: 11.9 s (ref 9.1–12.0)
aPTT: 31 s (ref 24–33)

## 2022-10-15 ENCOUNTER — Telehealth: Payer: Self-pay | Admitting: Internal Medicine

## 2022-10-15 ENCOUNTER — Other Ambulatory Visit: Payer: Self-pay

## 2022-10-15 MED ORDER — PREDNISOLONE ACETATE 1 % OP SUSP
1.0000 [drp] | Freq: Four times a day (QID) | OPHTHALMIC | 0 refills | Status: DC
  Filled 2022-10-15: qty 5, 12d supply, fill #0

## 2022-10-15 NOTE — Telephone Encounter (Signed)
Referral Request - Has patient seen PCP for this complaint? yes *If NO, is insurance requiring patient see PCP for this issue before PCP can refer them? Referral for which specialty: opthalmologist Preferred provider/office: ? Reason for referral: pt wants a second opinion different from the first opthalmologist /that Dr says its due to sun exposure but he isnt in the sun a lot and the balls in his eyes is  always gonna be there

## 2022-10-16 ENCOUNTER — Other Ambulatory Visit: Payer: Self-pay | Admitting: Nurse Practitioner

## 2022-10-16 DIAGNOSIS — H539 Unspecified visual disturbance: Secondary | ICD-10-CM

## 2022-10-19 ENCOUNTER — Telehealth: Payer: Self-pay | Admitting: Internal Medicine

## 2022-10-19 NOTE — Telephone Encounter (Signed)
Using Spanish interpreter [place], # [phone], py. Given lab results, verbalizes understanding.

## 2022-11-05 ENCOUNTER — Other Ambulatory Visit: Payer: Self-pay

## 2022-11-14 ENCOUNTER — Other Ambulatory Visit: Payer: Self-pay

## 2022-11-14 MED ORDER — PENICILLIN V POTASSIUM 500 MG PO TABS
500.0000 mg | ORAL_TABLET | Freq: Three times a day (TID) | ORAL | 1 refills | Status: DC
  Filled 2022-11-14: qty 28, 9d supply, fill #0

## 2022-11-14 MED ORDER — IBUPROFEN 600 MG PO TABS
600.0000 mg | ORAL_TABLET | Freq: Four times a day (QID) | ORAL | 1 refills | Status: DC
  Filled 2022-11-14: qty 20, 5d supply, fill #0

## 2022-11-19 ENCOUNTER — Other Ambulatory Visit: Payer: Self-pay | Admitting: Nurse Practitioner

## 2022-11-19 DIAGNOSIS — K7031 Alcoholic cirrhosis of liver with ascites: Secondary | ICD-10-CM

## 2022-12-10 ENCOUNTER — Other Ambulatory Visit (HOSPITAL_COMMUNITY): Payer: Self-pay | Admitting: Nurse Practitioner

## 2022-12-10 DIAGNOSIS — I8510 Secondary esophageal varices without bleeding: Secondary | ICD-10-CM

## 2022-12-10 DIAGNOSIS — K7031 Alcoholic cirrhosis of liver with ascites: Secondary | ICD-10-CM

## 2022-12-11 ENCOUNTER — Ambulatory Visit (HOSPITAL_COMMUNITY)
Admission: RE | Admit: 2022-12-11 | Discharge: 2022-12-11 | Disposition: A | Discharge status: Home / Self Care | Payer: 59 | Source: Ambulatory Visit | Attending: Nurse Practitioner | Admitting: Nurse Practitioner

## 2022-12-11 DIAGNOSIS — K7031 Alcoholic cirrhosis of liver with ascites: Secondary | ICD-10-CM | POA: Diagnosis present

## 2022-12-11 DIAGNOSIS — I8510 Secondary esophageal varices without bleeding: Secondary | ICD-10-CM | POA: Insufficient documentation

## 2022-12-21 ENCOUNTER — Other Ambulatory Visit: Payer: Self-pay

## 2022-12-21 ENCOUNTER — Other Ambulatory Visit: Payer: Self-pay | Admitting: Internal Medicine

## 2022-12-21 DIAGNOSIS — K7030 Alcoholic cirrhosis of liver without ascites: Secondary | ICD-10-CM

## 2022-12-21 MED ORDER — FOLIC ACID 1 MG PO TABS
1.0000 mg | ORAL_TABLET | Freq: Every day | ORAL | 0 refills | Status: DC
  Filled 2022-12-21: qty 90, 90d supply, fill #0

## 2022-12-21 MED ORDER — FAMOTIDINE 20 MG PO TABS
20.0000 mg | ORAL_TABLET | Freq: Every day | ORAL | 0 refills | Status: DC
  Filled 2022-12-21: qty 90, 90d supply, fill #0

## 2022-12-24 ENCOUNTER — Other Ambulatory Visit: Payer: Self-pay

## 2023-01-02 ENCOUNTER — Ambulatory Visit: Payer: 59 | Admitting: Physician Assistant

## 2023-01-02 ENCOUNTER — Ambulatory Visit: Payer: Self-pay | Admitting: *Deleted

## 2023-01-02 ENCOUNTER — Encounter: Payer: Self-pay | Admitting: Physician Assistant

## 2023-01-02 VITALS — BP 125/70 | HR 67 | Ht 65.0 in | Wt 260.0 lb

## 2023-01-02 DIAGNOSIS — M25572 Pain in left ankle and joints of left foot: Secondary | ICD-10-CM

## 2023-01-02 NOTE — Patient Instructions (Addendum)
I have started a referral for you to be seen by orthopedics for further evaluation.  I encourage you to use ice, bracing, and keep ankle elevated when able.  I hope that you feel better soon, please let us know if there is anything else we can do for you.  Roney Jaffe, PA-C Physician Assistant [facility] [email or web address]   Dolor en el tobillo Ankle Pain La articulacin del tobillo lo ayuda a pararse sobre la pierna y le permite moverse. El dolor en el tobillo puede ocurrir en cualquiera de los lados o en la parte posterior del tobillo. Puede sentir dolor en uno o en ambos tobillos. Puede sentir un dolor agudo que causa sensacin de ardor, o puede ser un dolor sordo y [place]. Puede haber sensibilidad al tacto, rigidez, enrojecimiento o sensacin de calor alrededor del tobillo. El dolor en el tobillo puede tener muchas causas. Estas incluyen una lesin en la zona y el uso excesivo del tobillo. Siga estas indicaciones en su casa: Actividad Ponga el tobillo en reposo como se lo haya indicado el mdico. Evite hacer cosas que le causen dolor en el tobillo. No apoye el peso del cuerpo Intel extremidad [employer] que lo autorice el mdico. Use las [employer] como se lo haya indicado el mdico. Pregntele al mdico cundo puede volver a conducir vehculos si tiene un dispositivo ortopdico en el tobillo. Haga los ejercicios como se lo haya indicado el mdico. Si tiene un dispositivo ortopdico extrable: Use el dispositivo ortopdico como se lo haya indicado el mdico. Quteselo solamente como se lo haya indicado el mdico. Controle todos los das la piel alrededor del dispositivo ortopdico. Informe al mdico acerca de cualquier inquietud. Afljelo si los dedos de los pies se le adormecen, siente hormigueos o se le enfran y se tornan de Research officer, trade union. Mantenga limpio el dispositivo ortopdico. Si el dispositivo ortopdico no es  impermeable: No deje que se moje. Cbralo con un envoltorio hermtico cuando tome un bao de inmersin o una ducha. Si tiene una venda elstica:  Qutesela para baarse. Intente no mover mucho el tobillo. Mueva los dedos de los pies de vez en cuando. Esto ayuda a evitar la hinchazn. Acomode la venda si la siente demasiado ajustada. Afloje la venda si siente hormigueos en el pie, o si el pie se le entumece o si se le enfra y se torna de [employer]. Control del dolor, la rigidez y la hinchazn  Si se lo indican, aplique hielo sobre la zona dolorida. Si tiene un dispositivo ortopdico desmontable o una venda elstica, quteselos como se lo haya indicado el mdico. Ponga el hielo en una bolsa plstica. Coloque una toalla entre la piel y Copy. Aplique el hielo durante 20 minutos, 2 a 3 veces por da. Si la piel se le pone de color rojo brillante, retire el hielo de inmediato para evitar daos en la piel. [place] de dao es mayor si no puede sentir dolor, Airline pilot o fro. Mueva los dedos del pie con frecuencia para reducir la rigidez y la hinchazn. Cuando est sentado o acostado, levante (eleve) el tobillo por encima del nivel del corazn. Indicaciones generales Use los medicamentos de venta libre y los recetados solamente como se lo haya indicado el mdico. Para ayudarlos a usted y a Chief Strategy Officer, anote lo siguiente: Con qu frecuencia le duele el tobillo. Dnde siente el dolor. Cmo se siente el dolor. Si le indican que use un determinado zapato o plantilla, asegrese de  usarlo correctamente y [employer] indiquen. Comunquese con un mdico si: El Product/process development scientist. El dolor no mejora con medicamentos. Tiene fiebre o escalofros. Tiene ms dificultad para caminar. Aparecen nuevos sntomas. El pie, la pierna, los dedos del pie o el tobillo presentan hormigueos, se le adormecen, se le hinchan, o se le enfran y se tornan de Research officer, trade union. Esta informacin no tiene Microbiologist el consejo del mdico. Asegrese de hacerle al mdico cualquier pregunta que tenga. Document Revised: 04/25/2022 Document Reviewed: 04/25/2022 Elsevier Patient Education  2024 [employer].

## 2023-01-02 NOTE — Telephone Encounter (Signed)
Patient was seen with [facility] today.

## 2023-01-02 NOTE — Progress Notes (Unsigned)
Established Patient Office Visit  Subjective   Patient ID: Jason [name], male    DOB: Apr 05, 1981  Age: 42 y.o. MRN: [id number]  Chief Complaint  Patient presents with  . Ankle Pain    Left, ankle, pain is felt when his weight shifts to the left or right. Moving forward or backwards no pain id felt. Denies any fall or twisting of the ankle     States that he has been experiencing  Leans to right or left it hurts - ascross the front  Little bit of swelling -  Applies pressure is tender Some tingling  Daughter is present  Pain cream - with little relief   For work wears more supportive  No sob    Past Medical History:  Diagnosis Date  . Alcoholic cirrhosis of liver (HCC)   . Alcoholic cirrhosis of liver with ascites (HCC) 06/29/2017   History of heavy, daily drinking. No alcohol since 05/2017. Started treatment 05/2017 with lactulose, thiamine, MVI Spironolactone + furosemide initiated 06/2017.  Marland Kitchen Alcoholic hepatitis with ascites   . Anemia   . Ankle fracture    Right  . Ascites   . 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 He is currently on lactulose for questionable history of [place]t encephalopathy.  No evidence of encephalopathy at today's visit.  Reviewed signs/symptoms of encephalopathy and indication to contact our office.  Marland Kitchen GERD (gastroesophageal reflux disease)   . Hypertension   . Nocturia   . SBP (spontaneous bacterial peritonitis) (HCC) 05/01/2018  . Thrombocytopenia (HCC)    Social History   Socioeconomic History  . Marital status: Legally Separated    Spouse name: Not on file  . Number of children: Not on file  . Years of education: Not on file  . Highest education level: Not on file  Occupational History  . Occupation: Education administrator Conservation officer, historic buildings)  Tobacco Use  . Smoking status: Former  . Smokeless tobacco: Never  . Tobacco comments:    said he  has tried it  Advertising account planner  . Vaping status: Never Used  Substance and Sexual Activity  . Alcohol use: Not Currently    Comment: 03/05/2018  . Drug use: Never  . Sexual activity: Yes  Other Topics Concern  . Not on file  Social History Narrative   ** Merged History Encounter **       Lives in [place] with wife and daughter.    Social Determinants of Health   Financial Resource Strain: Not on file  Food Insecurity: Low Risk  (05/21/2022)   Received [facility]   Hunger Vital Sign   . Worried About Programme researcher, broadcasting/film/video in the Last Year: Never true   . Ran Out of Food in the Last Year: Never true  Transportation Needs: Not on file (05/21/2022)  Physical Activity: Not on file  Stress: Not on file  Social Connections: Not on file  Intimate Partner Violence: Not on file   Family History  Problem Relation Age of Onset  . Colon cancer Neg Hx   . Esophageal cancer Neg Hx   . Healthy Mother   . Healthy Daughter    Allergies  Allergen Reactions  . Acetaminophen Other (See Comments)    Cirrhosis of the liver    ROS    Objective:     Ht 5\' 5"  (1.651 m)   Wt 260 lb (117.9  kg)   BMI 43.27 kg/m  BP Readings from Last 3 Encounters:  09/28/22 112/68  06/11/22 132/75  05/29/22 121/75   Wt Readings from Last 3 Encounters:  01/02/23 260 lb (117.9 kg)  09/28/22 259 lb 9.6 oz (117.8 kg)  06/10/22 270 lb (122.5 kg)      Physical Exam     Assessment & Plan:   Problem List Items Addressed This Visit   None   No follow-ups on file.    Kasandra Knudsen Mayers, PA-C

## 2023-01-02 NOTE — Telephone Encounter (Signed)
Summary: pain and swelling behind his left ankle.   Pt stated he is experiencing pain and swelling behind his left ankle. Stated this has been going on for a week; however, it has become more painful and is just not going away. Pt is requesting an appointment to be seen today.  Please return the pt call with a Research officer, trade union.     Chief Complaint: Ankle Swelling, Pain Symptoms: Mild swelling behind left ankle, painful 6-9/10. Worse with walking, bending certain way.  Frequency: 1 month, worsening past 1-1/2 weeks Pertinent Negatives: Patient denies redness, injury, calf pain Disposition: [] ED /[] Urgent Care (no appt availability in office) / [] Appointment(In office/virtual)/ []  [place] [facility]/ [] Home Care/ [] Refused Recommended Disposition /[x] [place] [facility]/ []  Follow-up with PCP Additional Notes: No availability at practice. [facility]. Care advise provided, pt verbalizes understanding.  Assisted by Interpreter Martyn Malay  # [phone]  Reason for Disposition  MILD or MODERATE ankle swelling (e.g., can't move joint normally, can't do usual activities) (Exceptions: Itchy, localized swelling; swelling is chronic.)  [1] SEVERE pain (e.g., excruciating, unable to walk) AND [2] not improved after 2 hours of pain medicine  Answer Assessment - Initial Assessment Questions 1. LOCATION: "Which ankle is swollen?" "Where is the swelling?"     Left, behind. Mild 2. ONSET: "When did the swelling start?"     1 month ago, pain worse past 1 -1/2 weeks 3. SWELLING: "How bad is the swelling?" Or, "How large is it?" (e.g., mild, moderate, severe; size of localized swelling)    - NONE: No joint swelling.   - LOCALIZED: Localized; small area of puffy or swollen skin (e.g., insect bite, skin irritation).   - MILD: Joint looks or feels mildly swollen or puffy.   - MODERATE: Swollen; interferes with normal activities (e.g., work or school); decreased range of movement; may be  limping.   - SEVERE: Very swollen; can't move swollen joint at all; limping a lot or unable to walk.     mild 4. PAIN: "Is there any pain?" If Yes, ask: "How bad is it?" (Scale 1-10; or mild, moderate, severe)   - NONE (0): no pain.   - MILD (1-3): doesn't interfere with normal activities.    - MODERATE (4-7): interferes with normal activities (e.g., work or school) or awakens from sleep, limping.    - SEVERE (8-10): excruciating pain, unable to do any normal activities, unable to walk.      6-9/10. Worse with walking, bending ankle 5. CAUSE: "What do you think caused the ankle swelling?"     Unsure 6. OTHER SYMPTOMS: "Do you have any other symptoms?" (e.g., fever, chest pain, difficulty breathing, calf pain)     None  Protocols used: Ankle Swelling-A-AH

## 2023-01-07 ENCOUNTER — Ambulatory Visit (INDEPENDENT_AMBULATORY_CARE_PROVIDER_SITE_OTHER): Payer: 59 | Admitting: Orthopedic Surgery

## 2023-01-07 ENCOUNTER — Other Ambulatory Visit (INDEPENDENT_AMBULATORY_CARE_PROVIDER_SITE_OTHER): Payer: 59

## 2023-01-07 ENCOUNTER — Encounter: Payer: Self-pay | Admitting: Orthopedic Surgery

## 2023-01-07 DIAGNOSIS — M25572 Pain in left ankle and joints of left foot: Secondary | ICD-10-CM | POA: Diagnosis not present

## 2023-01-07 DIAGNOSIS — M6702 Short Achilles tendon (acquired), left ankle: Secondary | ICD-10-CM

## 2023-01-07 DIAGNOSIS — M12572 Traumatic arthropathy, left ankle and foot: Secondary | ICD-10-CM

## 2023-01-07 NOTE — Progress Notes (Signed)
Office Visit Note   Patient: Jason [name]           Date of Birth: 1981/01/09           MRN: [id number] Visit Date: 01/07/2023              Requested by: Mayers, Kasandra Knudsen, PA-C [address] [place],  [place] [address] PCP: Marcine Matar, MD  Chief Complaint  Patient presents with   Left Ankle - Pain      HPI: Patient is a 42 year old gentleman who is seen for initial evaluation for traumatic arthritis left ankle.  Patient is status post intramedullary nailing of the left tibia for an open tib-fib fracture status post tissue grafting.  Patient has a history of alcoholic cirrhosis of the liver with ascites.  Assessment & Plan: Visit Diagnoses:  1. Pain in left ankle and joints of left foot   2. Traumatic arthritis of left ankle   3. Achilles tendon contracture, left     Plan: Recommended Achilles stretching to be done daily this was demonstrated.  Recommended Voltaren gel topically for the Achilles tendinitis or he could proceed with using over-the-counter anti-inflammatories.  With patient's esophageal varices it would be safer for him to proceed with topical Voltaren gel.  Patient was seen with an interpreter.  Patient works in Careers information officer and discussed that in the long run he would be in a job that is less stressful to his foot and ankle.  Follow-Up Instructions: Return if symptoms worsen or fail to improve.   Ortho Exam  Patient is alert, oriented, no adenopathy, well-dressed, normal affect, normal respiratory effort. Examination patient has a strong palpable dorsalis pedis and posterior tibial pulse.  There is no tenderness to palpation over the peroneal tendons posterior tibial tendon and Achilles tendon or anterior ankle joint.  Patient has significant Achilles contracture with dorsiflexion 20 degrees short of neutral with his knee extended.  Imaging: XR Ankle Complete Left  Result Date: 01/07/2023 Three-view radiographs of the left ankle  shows previous intramedullary nailing for tibia and fibula open fracture.  The tibial talar joint is congruent there are bony spurs anteriorly and posteriorly to the tibial talar joint.  No images are attached to the encounter.  Labs: Lab Results  Component Value Date   HGBA1C 5.1 05/29/2022   HGBA1C 4.6 (L) 06/16/2020   HGBA1C 4.7 03/06/2019   REPTSTATUS 10/11/2021 FINAL 10/10/2021   GRAMSTAIN  05/08/2018    CYTOSPIN SMEAR WBC PRESENT, PREDOMINANTLY MONONUCLEAR NO ORGANISMS SEEN Performed at [facility], [address], [place], [place] [address]    CULT  10/10/2021    NO GROWTH Performed at [facility], [address], [place], [place] [address]      Lab Results  Component Value Date   ALBUMIN 4.1 03/27/2022   ALBUMIN 3.6 (L) 10/30/2021   ALBUMIN 2.7 (L) 10/12/2021    Lab Results  Component Value Date   MG 1.9 05/01/2018   MG 1.4 (L) 03/04/2018   No results found for: "VD25OH"  No results found for: "PREALBUMIN"    Latest Ref Rng & Units 09/28/2022   11:58 AM 10/12/2021    4:00 AM 10/11/2021    3:53 AM  CBC EXTENDED  WBC 3.4 - 10.8 x10E3/uL 5.8  5.2  6.9   RBC 4.14 - 5.80 x10E6/uL 4.66  4.13  4.03   Hemoglobin 13.0 - 17.7 g/dL 21.3  08.6  57.8   HCT  37.5 - 51.0 % 42.3  39.7  38.6   Platelets 150 - 450 x10E3/uL 65  42  32   NEUT# 1.7 - 7.7 K/uL  2.6  4.2   Lymph# 0.7 - 4.0 K/uL  1.7  1.5      There is no height or weight on file to calculate BMI.  Orders:  Orders Placed This Encounter  Procedures   XR Ankle Complete Left   No orders of the defined types were placed in this encounter.    Procedures: No procedures performed  Clinical Data: No additional findings.  ROS:  All other systems negative, except as noted in the HPI. Review of Systems  Objective: Vital Signs: There were no vitals taken for this visit.  Specialty Comments:  No specialty comments available.  PMFS History: Patient Active Problem List   Diagnosis Date  Noted   Ruptured tympanic membrane, right 11/17/2021   Neuropathy of left lower extremity 11/17/2021   Abdominal pain 10/10/2021   Fever 10/10/2021   Gastritis and gastroduodenitis    Hematuria 04/25/2021   Gynecomastia 04/25/2021   Alcohol use disorder, severe, in sustained remission (HCC) 09/13/2020   Hypertension 09/13/2020   Portal hypertensive gastropathy (HCC)    Esophageal varices without bleeding (HCC)    Former smoker 06/10/2018   Alcoholic cirrhosis (HCC) 05/01/2018   Macrocytic anemia 05/01/2018   Hypokalemia 05/01/2018   Liver lesion, right lobe 05/01/2018   Liver failure (HCC) 05/01/2018   Liver failure without hepatic coma (HCC)    Thrombocytopenia (HCC) 03/04/2018   Alcoholic cirrhosis of liver with ascites (HCC) 06/29/2017   Past Medical History:  Diagnosis Date   Alcoholic cirrhosis of liver (HCC)    Alcoholic cirrhosis of liver with ascites (HCC) 06/29/2017   History of heavy, daily drinking. No alcohol since 05/2017. Started treatment 05/2017 with lactulose, thiamine, MVI Spironolactone + furosemide initiated 06/2017.   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 He is currently on lactulose for questionable history of Covert encephalopathy.  No evidence of encephalopathy at today's visit.  Reviewed signs/symptoms of encephalopathy and indication to contact our office.   GERD (gastroesophageal reflux disease)    Hypertension    Nocturia    SBP (spontaneous bacterial peritonitis) (HCC) 05/01/2018   Thrombocytopenia (HCC)     Family History  Problem Relation Age of Onset   Colon cancer Neg Hx    Esophageal cancer Neg Hx    Healthy Mother    Healthy Daughter     Past Surgical History:  Procedure Laterality Date   BIOPSY  08/10/2019   Procedure: BIOPSY;  Surgeon: Shellia Cleverly, DO;   Location: [facility];  Service: Gastroenterology;;   BIOPSY  02/08/2020   Procedure: BIOPSY;  Surgeon: Shellia Cleverly, DO;  Location: [facility];  Service: Gastroenterology;;   BIOPSY  09/11/2021   Procedure: BIOPSY;  Surgeon: Shellia Cleverly, DO;  Location: [facility];  Service: Gastroenterology;;   ESOPHAGEAL BANDING  08/10/2019   Procedure: ESOPHAGEAL BANDING;  Surgeon: Shellia Cleverly, DO;  Location: [facility];  Service: Gastroenterology;;   ESOPHAGOGASTRODUODENOSCOPY (EGD) WITH PROPOFOL N/A 08/10/2019   Procedure: ESOPHAGOGASTRODUODENOSCOPY (EGD) WITH PROPOFOL;  Surgeon: Shellia Cleverly, DO;  Location: [facility];  Service: Gastroenterology;  Laterality: N/A;   ESOPHAGOGASTRODUODENOSCOPY (EGD) WITH  PROPOFOL N/A 02/08/2020   Procedure: ESOPHAGOGASTRODUODENOSCOPY (EGD) WITH PROPOFOL;  Surgeon: Shellia Cleverly, DO;  [facility]: [facility];  Service: Gastroenterology;  Laterality: N/A;   ESOPHAGOGASTRODUODENOSCOPY (EGD) WITH PROPOFOL N/A 09/11/2021   Procedure: ESOPHAGOGASTRODUODENOSCOPY (EGD) WITH PROPOFOL;  Surgeon: Shellia Cleverly, DO;  Location: [facility];  Service: Gastroenterology;  Laterality: N/A;   FOOT SURGERY Left    around 2014. a scar on lower leg   ORIF ANKLE FRACTURE Right 06/09/2019   Procedure: RIGHT OPEN REDUCTION INTERNAL FIXATION (ORIF) ANKLE FRACTURE;  Surgeon: Sheral Apley, MD;  Location: [facility];  Service: Orthopedics;  Laterality: Right;   Social History   Occupational History   Occupation: Education administrator Conservation officer, historic buildings)  Tobacco Use   Smoking status: Former   Smokeless tobacco: Never   Tobacco comments:    said he has tried it  Advertising account planner   Vaping status: Never Used  Substance and Sexual Activity   Alcohol use: Not Currently    Comment: 03/05/2018   Drug use: Never   Sexual activity: Yes

## 2023-01-28 ENCOUNTER — Ambulatory Visit: Payer: 59 | Attending: Internal Medicine | Admitting: Internal Medicine

## 2023-01-28 ENCOUNTER — Other Ambulatory Visit: Payer: Self-pay

## 2023-01-28 ENCOUNTER — Encounter: Payer: Self-pay | Admitting: Internal Medicine

## 2023-01-28 VITALS — BP 125/76 | HR 67 | Temp 98.2°F | Ht 65.0 in | Wt 258.0 lb

## 2023-01-28 DIAGNOSIS — H11001 Unspecified pterygium of right eye: Secondary | ICD-10-CM

## 2023-01-28 DIAGNOSIS — Z23 Encounter for immunization: Secondary | ICD-10-CM | POA: Diagnosis not present

## 2023-01-28 DIAGNOSIS — M19072 Primary osteoarthritis, left ankle and foot: Secondary | ICD-10-CM | POA: Diagnosis not present

## 2023-01-28 DIAGNOSIS — K7030 Alcoholic cirrhosis of liver without ascites: Secondary | ICD-10-CM

## 2023-01-28 DIAGNOSIS — Z6841 Body Mass Index (BMI) 40.0 and over, adult: Secondary | ICD-10-CM

## 2023-01-28 MED ORDER — DICLOFENAC SODIUM 1 % EX GEL
2.0000 g | Freq: Four times a day (QID) | CUTANEOUS | 2 refills | Status: AC
  Filled 2023-01-28: qty 100, 12d supply, fill #0

## 2023-01-28 MED ORDER — TETANUS-DIPHTH-ACELL PERTUSSIS 5-2-15.5 LF-MCG/0.5 IM SUSP
0.5000 mL | Freq: Once | INTRAMUSCULAR | 0 refills | Status: AC
  Filled 2023-01-28: qty 0.5, 1d supply, fill #0

## 2023-01-28 NOTE — Progress Notes (Signed)
Patient ID: Jason [name], male    DOB: [birth year]  MRN: [id number]  CC: Cirrhosis (Alcoholic cirrhosis./Concern about R eye, redness and now has affected L eye /Flu vax administered 01/28/23 - C.A.)   Subjective: Jason [name] is a 42 y.o. male who presents for chronic ds management. His concerns today include:  Patient with history of severe ETOH use disorder in remission, alcohol induced cirrhosis, SBP, varices, pain/numbness left thigh on gabapentin   [employer] Language interpreter used during this encounter. #[id number]  Discussed the use of AI scribe software for clinical note transcription with the patient, who gave verbal consent to proceed.  History of Present Illness   Jason [name], a patient with a history of liver cirrhosis and obesity, presents with a concern about redness in his eyes.  Diagnosed with subconjunctival hemorrhage of the right eye on last visit with me.  Subsequently seen by an eye doctor and was prescribed some eyedrops.  Patient states he was told that it is a growth and nothing needs to be done.  Initially, the redness was only in the right eye, but it has now started to appear in the left eye as well.  Initially vision was a little blurred but it has cleared up with the eyedrops.  ETOH liver cirrhosis.  He denies any increased abdominal girth or swelling in the legs.  No longer on any diuretics.  Recent ultrasound done 11/2022 showed no signs of liver lesions or gallbladder polyp.  Questionable gallbladder polyp was seen on ultrasound done in February of this year.    Obesity: The patient remains in the obese range with a body mass index of 42. 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     He has seen orthopedic specialist Dr. Lajoyce Corners for pain in the left ankle and foot.  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   He had recommended Voltaren gel which patient did not receive.  Patient's perception was that nothing was done.  Patient Active Problem List   Diagnosis Date Noted   Ruptured tympanic membrane, right 11/17/2021   Neuropathy of left lower extremity 11/17/2021   Abdominal pain 10/10/2021   Fever 10/10/2021   Gastritis and gastroduodenitis    Hematuria 04/25/2021   Gynecomastia 04/25/2021   Alcohol use disorder, severe, in sustained remission (HCC) 09/13/2020   Hypertension 09/13/2020   Portal hypertensive gastropathy (HCC)    Esophageal varices without bleeding (HCC)    Former smoker 06/10/2018   Alcoholic cirrhosis (HCC) 05/01/2018   Macrocytic anemia 05/01/2018   Hypokalemia 05/01/2018   Liver lesion, right lobe 05/01/2018   Liver failure (HCC) 05/01/2018   Liver failure without hepatic coma (HCC)    Thrombocytopenia (HCC) 03/04/2018   Alcoholic cirrhosis of liver with ascites (HCC) 06/29/2017     Current Outpatient Medications on File Prior to Visit  Medication Sig Dispense Refill   famotidine (PEPCID) 20 MG tablet Take 1 tablet (20 mg total) by mouth daily. 90 tablet 0   folic acid (FOLVITE) 1 MG tablet Take 1 tablet (1 mg total) by mouth daily. 90 tablet 0   lactulose (CHRONULAC) 10 GM/15ML solution Take 30 mLs (20 g total) by mouth 2 (two) times daily. 1892 mL 2   thiamine (VITAMIN B1) 100 MG tablet Take 1 tablet (100 mg total) by mouth daily. 100 tablet 1   cycloSPORINE (RESTASIS) 0.05 % ophthalmic emulsion  Instill 1 drop into both eyes twice a day (Patient not taking: Reported on 01/28/2023) 180 each 3   fluorometholone (FML) 0.1 % ophthalmic suspension Instill 1 drop into both eyes 4 times day for 2 weeks, then 1 drop 2 times day for 2 weeks, then stop (Patient not taking: Reported on 01/28/2023) 10 mL 0   gabapentin (NEURONTIN) 300 MG capsule Take 1 capsule (300 mg total) by mouth 3 (three) times daily. (Patient not taking: Reported on 01/02/2023) 270 capsule 1   prednisoLONE  acetate (PRED FORTE) 1 % ophthalmic suspension Place 1 drop into both eyes 4 (four) times daily. (Patient not taking: Reported on 01/02/2023) 10 mL 0   No current facility-administered medications on file prior to visit.    Allergies  Allergen Reactions   Acetaminophen Other (See Comments)    Cirrhosis of the liver    Social History   Socioeconomic History   Marital status: Legally Separated    Spouse name: Not on file   Number of children: Not on file   Years of education: Not on file   Highest education level: Not on file  Occupational History   Occupation: Education administrator Conservation officer, historic buildings)  Tobacco Use   Smoking status: Former   Smokeless tobacco: Never   Tobacco comments:    said he has tried it  Advertising account planner   Vaping status: Never Used  Substance and Sexual Activity   Alcohol use: Not Currently    Comment: 03/05/2018   Drug use: Never   Sexual activity: Yes  Other Topics Concern   Not on file  Social History Narrative   ** Merged History Encounter **       Lives in [place] with wife and daughter.    Social Determinants of Health   Financial Resource Strain: Not on file  Food Insecurity: Low Risk  (05/21/2022)   [facility]   Hunger Vital Sign    Worried About Running Out of Food in the Last Year: Never true    Ran Out of Food in the Last Year: Never true  Transportation Needs: Not on file (05/21/2022)  Physical Activity: Not on file  Stress: Not on file  Social Connections: Unknown (01/28/2023)   Social Connection and Isolation Panel [NHANES]    Frequency of Communication with Friends and Family: More than three times a week    Frequency of Social Gatherings with Friends and Family: Not on file    Attends Religious Services: Not on file    Active Member of Clubs or Organizations: No    Attends Banker Meetings: Not on file    Marital Status: Not on file  Intimate Partner Violence: Not on file    Family History  Problem  Relation Age of Onset   Colon cancer Neg Hx    Esophageal cancer Neg Hx    Healthy Mother    Healthy Daughter     Past Surgical History:  Procedure Laterality Date   BIOPSY  08/10/2019   Procedure: BIOPSY;  Surgeon: Shellia Cleverly, DO;  Location: [facility];  Service: Gastroenterology;;   BIOPSY  02/08/2020   Procedure: BIOPSY;  Surgeon: Shellia Cleverly, DO;  Location: [facility];  Service: Gastroenterology;;   BIOPSY  09/11/2021   Procedure: BIOPSY;  Surgeon: Shellia Cleverly, DO;  [facility];  Service: Gastroenterology;;   ESOPHAGEAL BANDING  08/10/2019   Procedure: ESOPHAGEAL BANDING;  Surgeon: Shellia Cleverly, DO;  Location: [facility];  Service: Gastroenterology;;  ESOPHAGOGASTRODUODENOSCOPY (EGD) WITH PROPOFOL N/A 08/10/2019   Procedure: ESOPHAGOGASTRODUODENOSCOPY (EGD) WITH PROPOFOL;  Surgeon: Shellia Cleverly, DO;  Location: [facility];  Service: Gastroenterology;  Laterality: N/A;   ESOPHAGOGASTRODUODENOSCOPY (EGD) WITH PROPOFOL N/A 02/08/2020   Procedure: ESOPHAGOGASTRODUODENOSCOPY (EGD) WITH PROPOFOL;  Surgeon: Shellia Cleverly, DO;  Location: [facility];  Service: Gastroenterology;  Laterality: N/A;   ESOPHAGOGASTRODUODENOSCOPY (EGD) WITH PROPOFOL N/A 09/11/2021   Procedure: ESOPHAGOGASTRODUODENOSCOPY (EGD) WITH PROPOFOL;  Surgeon: Shellia Cleverly, DO;  Location: [facility];  Service: Gastroenterology;  Laterality: N/A;   FOOT SURGERY Left    around 2014. a scar on lower leg   ORIF ANKLE FRACTURE Right 06/09/2019   Procedure: RIGHT OPEN REDUCTION INTERNAL FIXATION (ORIF) ANKLE FRACTURE;  Surgeon: Sheral Apley, MD;  Location: [facility];  Service: Orthopedics;  Laterality: Right;    ROS: Review of Systems Negative except as stated above  PHYSICAL EXAM: BP 125/76 (BP Location: Left Arm, Patient Position: Sitting, Cuff Size: Large)   Pulse 67   Temp 98.2 F (36.8 C) (Oral)   Ht 5\' 5"  (1.651 m)   Wt 258 lb (117  kg)   SpO2 99%   BMI 42.93 kg/m   Wt Readings from Last 3 Encounters:  01/28/23 258 lb (117 kg)  01/02/23 260 lb (117.9 kg)  09/28/22 259 lb 9.6 oz (117.8 kg)   Physical Exam   MEASUREMENTS: WT- 258 pounds, BMI- 42 HEENT: Right eye exhibits pterygium near nasal bridge. CHEST: Lungs clear, no wheezes or crackles. CARDIOVASCULAR: Heart sounds regular, no murmurs. EXTREMITIES: No edema in legs. Physical Exam   General: Middle-age obese Hispanic male in NAD HEENT: Right eye exhibits red triangular appearing fleshy tissue on the medial aspect of the eye near nasal bridge. CHEST: Lungs clear, no wheezes or crackles. CARDIOVASCULAR: Heart sounds regular, no murmurs. EXTREMITIES: No edema in legs.          01/28/2023    9:23 AM 01/02/2023    2:59 PM 09/28/2022   11:25 AM  Depression screen PHQ 2/9  Decreased Interest 0 0 0  Down, Depressed, Hopeless 0 0 0  PHQ - 2 Score 0 0 0  Altered sleeping 0 0 0  Tired, decreased energy 0 1 0  Change in appetite 0 1 1  Feeling bad or failure about yourself  0 0 0  Trouble concentrating 0 0 0  Moving slowly or fidgety/restless 0 0 0  Suicidal thoughts 0 0 0  PHQ-9 Score 0 2 1  Difficult doing work/chores  Not difficult at all       01/28/2023    9:23 AM 01/02/2023    3:00 PM 09/28/2022   11:25 AM 03/27/2022   11:08 AM  GAD 7 : Generalized Anxiety Score  Nervous, Anxious, on Edge 0 0 0 0  Control/stop worrying 0 0 0 0  Worry too much - different things 0 0 0 0  Trouble relaxing 0 1 0 0  Restless 0 0 0 0  Easily annoyed or irritable 0 0 0 1  Afraid - awful might happen 0 0 0 0  Total GAD 7 Score 0 1 0 1  Anxiety Difficulty Not difficult at all Not difficult at all          Latest Ref Rng & Units 05/29/2022   11:55 AM 03/27/2022   12:22 PM 10/30/2021    9:11 AM  CMP  Glucose 70 - 99 mg/dL 89  95  161   BUN 6 - 20  mg/dL 7  7  6    Creatinine 0.76 - 1.27 mg/dL 9.62  9.52  8.41   Sodium 134 - 144 mmol/L 144  146  141   Potassium 3.5 -  5.2 mmol/L 3.6  3.4  3.5   Chloride 96 - 106 mmol/L 109  109  109   CO2 20 - 29 mmol/L 20  22  20    Calcium 8.7 - 10.2 mg/dL 9.5  8.8  8.5   Total Protein 6.0 - 8.5 g/dL  6.4  6.6   Total Bilirubin 0.0 - 1.2 mg/dL  1.4  1.6   Alkaline Phos 44 - 121 IU/L  173  163   AST 0 - 40 IU/L  48  51   ALT 0 - 44 IU/L  35  32    Lipid Panel     Component Value Date/Time   CHOL 146 05/29/2022 1155   TRIG 72 05/29/2022 1155   HDL 83 05/29/2022 1155   CHOLHDL 1.8 05/29/2022 1155   LDLCALC 49 05/29/2022 1155    CBC    Component Value Date/Time   WBC 5.8 09/28/2022 1158   WBC 5.2 10/12/2021 0400   RBC 4.66 09/28/2022 1158   RBC 4.13 (L) 10/12/2021 0400   HGB 14.4 09/28/2022 1158   HCT 42.3 09/28/2022 1158   PLT 65 (LL) 09/28/2022 1158   MCV 91 09/28/2022 1158   MCH 30.9 09/28/2022 1158   MCH 33.2 10/12/2021 0400   MCHC 34.0 09/28/2022 1158   MCHC 34.5 10/12/2021 0400   RDW 13.3 09/28/2022 1158   LYMPHSABS 1.7 10/12/2021 0400   LYMPHSABS 1.6 05/07/2019 1413   MONOABS 0.7 10/12/2021 0400   EOSABS 0.2 10/12/2021 0400   EOSABS 0.1 05/07/2019 1413   BASOSABS 0.0 10/12/2021 0400   BASOSABS 0.0 05/07/2019 1413    ASSESSMENT AND PLAN: 1. Pterygium of right eye This looks like a pterygium to me.  Explained to the patient that the pterygium is a noncancerous fibrous lesion that can develop in the eye.  Usually does not impair vision but can cause irritation and redness.  Recommend wearing sunglasses when exposed to sun or extreme when the weather.  Follow-up with ophthalmology if this becomes bothersome to him.  2. Alcoholic cirrhosis of liver without ascites (HCC) Stable.  He has remained free of alcohol.  3. Morbid obesity with BMI of 40.0-44.9, adult (HCC) Encouraged him to continue trying to eat healthy and to move as much as he can.  4. Arthritis of left ankle Advised patient to use the anti-inflammatory pain rub.  Avoid oral NSAIDs given his history of cirrhosis. - diclofenac  Sodium (VOLTAREN) 1 % GEL; Apply 2 g topically 4 (four) times daily.  Dispense: 100 g; Refill: 2  5. Encounter for immunization - Flu vaccine trivalent PF, 6mos and older(Flulaval,Afluria,Fluarix,Fluzone)  6. Need for Tdap vaccination Agreeable to receiving Tdap vaccine.  Prescription given for him to take to our pharmacy downstairs. - Tdap (ADACEL) 08-29-13.5 LF-MCG/0.5 injection; Inject 0.5 mLs into the muscle once for 1 dose.  Dispense: 0.5 mL; Refill: 0  Patient was given the opportunity to ask questions.  Patient verbalized understanding of the plan and was able to repeat key elements of the plan.   This documentation was completed using Paediatric nurse.  Any transcriptional errors are unintentional.  Orders Placed This Encounter  Procedures   Flu vaccine trivalent PF, 6mos and older(Flulaval,Afluria,Fluarix,Fluzone)     Requested Prescriptions   Signed Prescriptions Disp Refills  Tdap (ADACEL) 08-29-13.5 LF-MCG/0.5 injection 0.5 mL 0    Sig: Inject 0.5 mLs into the muscle once for 1 dose.   diclofenac Sodium (VOLTAREN) 1 % GEL 100 g 2    Sig: Apply 2 g topically 4 (four) times daily.    Return in about 4 months (around 05/30/2023) for chronic ds management.  Jonah Blue, MD, FACP

## 2023-01-31 ENCOUNTER — Other Ambulatory Visit: Payer: Self-pay

## 2023-05-05 ENCOUNTER — Other Ambulatory Visit: Payer: Self-pay | Admitting: Internal Medicine

## 2023-05-05 DIAGNOSIS — K7030 Alcoholic cirrhosis of liver without ascites: Secondary | ICD-10-CM

## 2023-05-06 ENCOUNTER — Other Ambulatory Visit: Payer: Self-pay

## 2023-05-06 MED ORDER — FOLIC ACID 1 MG PO TABS
1.0000 mg | ORAL_TABLET | Freq: Every day | ORAL | 0 refills | Status: DC
  Filled 2023-05-06: qty 90, 90d supply, fill #0

## 2023-05-06 MED ORDER — FAMOTIDINE 20 MG PO TABS
20.0000 mg | ORAL_TABLET | Freq: Every day | ORAL | 0 refills | Status: DC
  Filled 2023-05-06: qty 90, 90d supply, fill #0

## 2023-05-07 ENCOUNTER — Other Ambulatory Visit: Payer: Self-pay

## 2023-05-07 ENCOUNTER — Other Ambulatory Visit (HOSPITAL_COMMUNITY): Payer: Self-pay

## 2023-05-23 ENCOUNTER — Other Ambulatory Visit: Payer: Self-pay | Admitting: Nurse Practitioner

## 2023-05-23 DIAGNOSIS — R748 Abnormal levels of other serum enzymes: Secondary | ICD-10-CM | POA: Insufficient documentation

## 2023-05-23 DIAGNOSIS — K7031 Alcoholic cirrhosis of liver with ascites: Secondary | ICD-10-CM

## 2023-05-23 DIAGNOSIS — I8510 Secondary esophageal varices without bleeding: Secondary | ICD-10-CM

## 2023-05-30 ENCOUNTER — Ambulatory Visit: Payer: 59 | Attending: Internal Medicine | Admitting: Internal Medicine

## 2023-05-30 ENCOUNTER — Ambulatory Visit
Admission: RE | Admit: 2023-05-30 | Discharge: 2023-05-30 | Disposition: A | Discharge status: Home / Self Care | Payer: 59 | Source: Ambulatory Visit | Attending: Nurse Practitioner | Admitting: Nurse Practitioner

## 2023-05-30 ENCOUNTER — Encounter: Payer: Self-pay | Admitting: Internal Medicine

## 2023-05-30 VITALS — BP 118/73 | HR 70 | Temp 98.2°F | Resp 20 | Ht 65.0 in | Wt 264.0 lb

## 2023-05-30 DIAGNOSIS — K7030 Alcoholic cirrhosis of liver without ascites: Secondary | ICD-10-CM

## 2023-05-30 DIAGNOSIS — F1021 Alcohol dependence, in remission: Secondary | ICD-10-CM

## 2023-05-30 DIAGNOSIS — E876 Hypokalemia: Secondary | ICD-10-CM

## 2023-05-30 DIAGNOSIS — K7031 Alcoholic cirrhosis of liver with ascites: Secondary | ICD-10-CM

## 2023-05-30 DIAGNOSIS — I8510 Secondary esophageal varices without bleeding: Secondary | ICD-10-CM

## 2023-05-30 DIAGNOSIS — E66813 Obesity, class 3: Secondary | ICD-10-CM

## 2023-05-30 DIAGNOSIS — Z6841 Body Mass Index (BMI) 40.0 and over, adult: Secondary | ICD-10-CM

## 2023-05-30 DIAGNOSIS — R748 Abnormal levels of other serum enzymes: Secondary | ICD-10-CM

## 2023-05-30 NOTE — Patient Instructions (Signed)
Alimentacin saludable en los [employer], Adult Una alimentacin saludable puede ayudarlo a Barista y Pharmacologist un peso saludable, reducir el riesgo de tener enfermedades crnicas y vivir Neomia Dear vida larga y productiva. Es importante que siga una modalidad de alimentacin saludable. Sus necesidades nutricionales y calricas deben satisfacerse principalmente con distintos alimentos ricos en nutrientes. Consejos para seguir Surveyor, minerals Lea las etiquetas de los alimentos Lea las etiquetas y elija las que digan lo siguiente: Productos reducidos en sodio o con bajo contenido de [place]. Jugos con 100 % jugo de fruta. Alimentos con bajo contenido de grasas saturadas (menos de 3 g por porcin) y alto contenido de grasas poliinsaturadas y Mining engineer. Alimentos con cereales integrales, como trigo integral, trigo partido, arroz integral y arroz salvaje. Cereales integrales fortificados con cido flico. Esto se recomienda a las mujeres embarazadas o que desean quedar embarazadas. Lea las etiquetas y no coma ni beba lo siguiente: Alimentos o bebidas con azcar agregada. Estos incluyen los alimentos que contienen azcar moreno, endulzante a base de maz, jarabe de maz, dextrosa, fructosa, glucosa, jarabe de maz de alta fructosa, miel, azcar invertido, lactosa, jarabe de [place], maltosa, [place], azcar sin refinar, sacarosa, trehalosa y azcar turbinado. Limite el consumo de azcar agregada a menos del 10 % del total de caloras diarias. No consuma ms que las siguientes cantidades de azcar agregada por da: 6 cucharaditas (25 g) para las mujeres. 9 cucharaditas (38 g) para los hombres. Los alimentos que contienen almidones y cereales refinados o procesados. Los productos de cereales refinados, como harina blanca, harina de maz desgerminada, pan blanco y arroz blanco. Al ir de compras Elija refrigerios ricos en nutrientes, como verduras, frutas enteras y frutos secos. Evite los refrigerios con  alto contenido de caloras y International aid/development worker, como las papas fritas, los refrigerios frutales y los caramelos. Use alios y productos para untar a base de aceite con los Publishing rights manager de grasas slidas como la [place], la [place], la crema agria o el queso crema. Limite las salsas, las mezclas y los productos "instantneos" preelaborados como el arroz saborizado, los fideos instantneos y las pastas listas para comer. Pruebe ms fuentes de protena vegetal, como tofu, tempeh, frijoles negros, edamame, lentejas, frutos secos y semillas. Explore planes de alimentacin como la dieta mediterrnea o la dieta vegetariana. Pruebe salsas cardiosaludables hechas con frijoles y grasas saludables, como hummus y [place]. Las verduras van muy bien con ellas. Al cocinar Use aceite para Designer, multimedia de grasas slidas como [place], margarina o [place] de [place]. En lugar de frer, trate de cocinar en el horno, en la plancha o en la parrilla, o hervir los alimentos. Retire la parte grasa de las carnes antes de cocinarlas. Cocine las verduras al vapor en agua o caldo. Planificacin de las comidas  En las comidas, imagine dividir su plato en cuartos: La mitad del plato tiene frutas y verduras. Un cuarto del plato tiene cereales integrales. Un cuarto del plato tiene protena, especialmente carnes [place], aves, huevos, tofu, frijoles o frutos secos. Incluya lcteos descremados en su dieta diaria. Estilo de vida Elija opciones saludables en todos los mbitos, como en el hogar, el [place], la [place], los restaurantes y [place]. Prepare los alimentos de un modo seguro: Lvese las manos despus de manipular carnes crudas. Donde prepare alimentos, mantenga las superficies limpias lavndolas regularmente con agua caliente y [place]. Mantenga las carnes crudas separadas de los alimentos que estn listos para comer como las frutas y las verduras. Cocine los frutos  de mar, carnes, aves y Loss adjuster, chartered la temperatura recomendada. Consiga un termmetro para alimentos. Almacene los alimentos a temperaturas seguras. En general: Mantenga los alimentos fros a una temperatura de 40 F (4,4 C) o inferior. Mantenga los alimentos calientes a una temperatura de 140 F (60 C) o superior. Mantenga el congelador a una temperatura de 0 F (-17,8 C) o inferior. Los alimentos no son seguros para su consumo cuando han estado a una temperatura de entre 40 y 140 F (4.4 y 60 C) por ms de 2 horas. Qu alimentos debo comer? Frutas Propngase comer entre 1 y 2 tazas de frutas frescas, Primary school teacher (en su jugo natural) o Primary school teacher. Una taza de fruta equivale a 1 manzana pequea, 1 banana grande, 8 fresas grandes, 1 taza (237 g) de fruta enlatada,  taza (82 g) de fruta seca o 1 taza (240 ml) de jugo al 100 %. Verduras Propngase comer de 2 a 4 tazas de verduras frescas y Primary school teacher, incluyendo diferentes variedades y colores. Una taza de verduras equivale a 1 taza (91 g) de brcoli o coliflor, 2 zanahorias medianas, 2 tazas (150 g) de verduras de [employer] crudas, 1 tomate grande, 1 pimiento morrn grande, 1 batata grande o 1 patata blanca mediana. Cereales Propngase comer el equivalente a entre 4 y 10 onzas de cereales integrales por Futures trader. Algunos ejemplos de equivalentes a 1 onza de cereales son 1 rebanada de pan, 1 taza (40 g) de cereal listo para comer, 3 tazas (24 g) de palomitas de maz o  taza (93 g) de arroz cocido. Carnes y otras protenas Propngase comer el equivalente a entre 5 y 7  onzas de protena por Futures trader. Algunos ejemplos de equivalentes a 1 onza de protenas incluyen 1 huevo,  oz de frutos secos (12 almendras, 24 pistachos o 7 mitades de nueces), 1/4 taza (90 g) de frijoles cocidos, 6 cucharadas (90 g) de hummus o 1 cucharada (16 g) de [place] de man. Un corte de carne o pescado del tamao de un mazo de cartas equivale aproximadamente a 3 a 4 onzas (85  g). De las protenas que consume cada semana, intente que al menos 8 onzas (227 g) sean frutos de mar. Esto equivale a unas 2 porciones por semana. Esto incluye salmn, trucha, arenque y anchoas. Lcteos [employer] a 3 tazas de lcteos descremados o con bajo contenido de Museum/gallery curator. Algunos ejemplos de equivalentes a 1 taza de lcteos son 1 taza (240 ml) de leche, 8 onzas (250 g) de yogur, 1 onzas (44 g) de queso natural o 1 taza (240 ml) de leche de soja fortificada. Grasas y aceites Propngase consumir alrededor de 5 cucharaditas (21 g) de grasas y Acupuncturist. Elija grasas monoinsaturadas, como el aceite de canola y de [place], la [place] con aceite de [place] o de [place], [place], [place] de man y Games developer de los frutos secos, o bien grasas poliinsaturadas, como el aceite de [place], maz y soja, nueces, piones, semillas de ssamo, semillas de girasol y semillas de lino. Bebidas Propngase beber 6 vasos de 8 onzas de Warehouse manager. Limite el caf a entre 3 y 5 tazas de ocho onzas por Futures trader. Limite el consumo de bebidas con cafena que tengan caloras agregadas, como los refrescos y las bebidas energizantes. Si bebe alcohol: Limite la cantidad que bebe a lo siguiente: De 0 a 1 medida al da si es [place]. De 0 a 2 medidas  al da si es varn. Sepa cunta cantidad de alcohol hay en las bebidas que toma. En los [address], una medida es una botella de cerveza de 12 oz (355 ml), un vaso de vino de 5 oz (148 ml) o un vaso de una bebida alcohlica de alta graduacin de 1 oz (44 ml). Condimentos y otros alimentos Trate de no agregar demasiada sal a los alimentos. Trate de usar hierbas y especias en lugar de sal. Trate de no agregar azcar a los alimentos. Esta informacin se basa en las pautas de nutricin de los [place]. Para obtener ms informacin, visite [email or web address]. Las Information systems manager. Es posible que necesite cantidades  diferentes. Esta informacin no tiene Theme park manager el consejo del mdico. Asegrese de hacerle al mdico cualquier pregunta que tenga. Document Revised: 02/13/2022 Document Reviewed: 02/13/2022 Elsevier Patient Education  2024 [employer].

## 2023-05-30 NOTE — Progress Notes (Signed)
Patient ID: Jason [name], male    DOB: May 22, 1980  MRN: [id number]  CC: Medical Management of Chronic Issues   Subjective: Jason [name] is a 43 y.o. male who presents for chronic ds management. His concerns today include:  Patient with history of severe ETOH use disorder in remission, alcohol induced cirrhosis, SBP, varices, pain/numbness left thigh on gabapentin   AMN Language interpreter used during this encounter. #Gibrann [id number]  ETOH liver cirrhosis.  Seen at [facility] 05/23/23 for f/u.  LFTs a little bit more elev compared to 6 mths ago. Plt count 61,000. K+ level was 3.3. He is not on diuretics or potassium supplement. Had US done this a.m showing no liver lesion but known cirrhosis morphology. He denies any increased abdominal girth or swelling in the legs.  -no ETOH use in 5 yrs  Obesity: wgh up  6 lbs in last 4 mths.  Attributes wgh gain to over eating during the holidays. Working on trying to get back on track.  Has seen nutritionist 06/2022 and found it helpful.  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  Seeing ortho at [facility].  Has f/u appt next wk for possible injection Patient Active Problem List   Diagnosis Date Noted   Elevated alkaline phosphatase level 05/23/2023   Ruptured tympanic membrane, right 11/17/2021   Neuropathy of left lower extremity 11/17/2021   Abdominal pain 10/10/2021   Fever 10/10/2021   Gastritis and gastroduodenitis    Hematuria 04/25/2021   Gynecomastia 04/25/2021   Alcohol use disorder, severe, in sustained remission (HCC) 09/13/2020   Esophageal varices without bleeding (HCC) 09/13/2020   Hypertension 09/13/2020   Portal hypertensive gastropathy (HCC)    Former smoker 06/10/2018   Alcoholic cirrhosis (HCC) 05/01/2018   Macrocytic anemia 05/01/2018   Hypokalemia 05/01/2018   Liver lesion, right lobe 05/01/2018   Liver failure (HCC) 05/01/2018   Liver failure  without hepatic coma (HCC)    Thrombocytopenia (HCC) 03/04/2018   Alcoholic cirrhosis of liver with ascites (HCC) 06/29/2017     Current Outpatient Medications on File Prior to Visit  Medication Sig Dispense Refill   diclofenac Sodium (VOLTAREN) 1 % GEL Apply 2 g topically 4 (four) times daily. 100 g 2   famotidine (PEPCID) 20 MG tablet Take 1 tablet (20 mg total) by mouth daily. 90 tablet 0   folic acid (FOLVITE) 1 MG tablet Take 1 tablet (1 mg total) by mouth daily. 90 tablet 0   lactulose (CHRONULAC) 10 GM/15ML solution Take 30 mLs (20 g total) by mouth 2 (two) times daily. 1892 mL 2   cycloSPORINE (RESTASIS) 0.05 % ophthalmic emulsion Instill 1 drop into both eyes twice a day (Patient not taking: Reported on 05/30/2023) 180 each 3   fluorometholone (FML) 0.1 % ophthalmic suspension Instill 1 drop into both eyes 4 times day for 2 weeks, then 1 drop 2 times day for 2 weeks, then stop (Patient not taking: Reported on 05/30/2023) 10 mL 0   gabapentin (NEURONTIN) 300 MG capsule Take 1 capsule (300 mg total) by mouth 3 (three) times daily. (Patient not taking: Reported on 05/30/2023) 270 capsule 1   prednisoLONE acetate (PRED FORTE) 1 % ophthalmic suspension Place 1 drop into both eyes 4 (four) times daily. (Patient not taking: Reported on 05/30/2023) 10 mL 0   thiamine (VITAMIN B1) 100 MG tablet Take 1 tablet (100 mg total) by mouth daily. (Patient not taking: Reported on  05/30/2023) 100 tablet 1   No current facility-administered medications on file prior to visit.    Allergies  Allergen Reactions   Acetaminophen Other (See Comments)    Cirrhosis of the liver    Social History   Socioeconomic History   Marital status: Legally Separated    Spouse name: Not on file   Number of children: Not on file   Years of education: Not on file   Highest education level: Not on file  Occupational History   Occupation: Education administrator Conservation officer, historic buildings)  Tobacco Use   Smoking status: Former   Smokeless tobacco:  Never   Tobacco comments:    said he has tried it  Advertising account planner   Vaping status: Never Used  Substance and Sexual Activity   Alcohol use: Not Currently    Comment: 03/05/2018   Drug use: Never   Sexual activity: Yes  Other Topics Concern   Not on file  Social History Narrative   ** Merged History Encounter **       Lives in [place] with wife and daughter.    Social Drivers of Corporate investment banker Strain: Not on file  Food Insecurity: Low Risk  (05/23/2023)   Received from [facility]   Hunger Vital Sign    Worried About Running Out of Food in the Last Year: Never true    Ran Out of Food in the Last Year: Never true  Transportation Needs: No Transportation Needs (05/23/2023)   Received from [facility]    In the past 12 months, has lack of reliable transportation kept you from medical appointments, meetings, work or from getting things needed for daily living? : No  Physical Activity: Not on file  Stress: Not on file  Social Connections: Unknown (01/28/2023)   Social Connection and Isolation Panel [NHANES]    Frequency of Communication with Friends and Family: More than three times a week    Frequency of Social Gatherings with Friends and Family: Not on file    Attends Religious Services: Not on file    Active Member of Clubs or Organizations: No    Attends Banker Meetings: Not on file    Marital Status: Not on file  Intimate Partner Violence: Not on file    Family History  Problem Relation Age of Onset   Colon cancer Neg Hx    Esophageal cancer Neg Hx    Healthy Mother    Healthy Daughter     Past Surgical History:  Procedure Laterality Date   BIOPSY  08/10/2019   Procedure: BIOPSY;  Surgeon: Shellia Cleverly, DO;  Location: [facility];  Service: Gastroenterology;;   BIOPSY  02/08/2020   Procedure: BIOPSY;  Surgeon: Shellia Cleverly, DO;  Location: [facility];  Service: Gastroenterology;;   BIOPSY  09/11/2021    Procedure: BIOPSY;  Surgeon: Shellia Cleverly, DO;  Location: [facility];  Service: Gastroenterology;;   ESOPHAGEAL BANDING  08/10/2019   Procedure: ESOPHAGEAL BANDING;  Surgeon: Shellia Cleverly, DO;  Location: [facility];  Service: Gastroenterology;;   ESOPHAGOGASTRODUODENOSCOPY (EGD) WITH PROPOFOL N/A 08/10/2019   Procedure: ESOPHAGOGASTRODUODENOSCOPY (EGD) WITH PROPOFOL;  Surgeon: Shellia Cleverly, DO;  Location: [facility];  Service: Gastroenterology;  Laterality: N/A;   ESOPHAGOGASTRODUODENOSCOPY (EGD) WITH PROPOFOL N/A 02/08/2020   Procedure: ESOPHAGOGASTRODUODENOSCOPY (EGD) WITH PROPOFOL;  Surgeon: Shellia Cleverly, DO;  Location: [facility];  Service: Gastroenterology;  Laterality: N/A;   ESOPHAGOGASTRODUODENOSCOPY (EGD) WITH PROPOFOL N/A 09/11/2021   Procedure: ESOPHAGOGASTRODUODENOSCOPY (  EGD) WITH PROPOFOL;  Surgeon: Shellia Cleverly, DO;  Location: [facility];  Service: Gastroenterology;  Laterality: N/A;   FOOT SURGERY Left    around 2014. a scar on lower leg   ORIF ANKLE FRACTURE Right 06/09/2019   Procedure: RIGHT OPEN REDUCTION INTERNAL FIXATION (ORIF) ANKLE FRACTURE;  Surgeon: Sheral Apley, MD;  Location: [facility];  Service: Orthopedics;  Laterality: Right;    ROS: Review of Systems Negative except as stated above  PHYSICAL EXAM: BP 118/73 (BP Location: Left Arm, Patient Position: Sitting, Cuff Size: Large)   Pulse 70   Temp 98.2 F (36.8 C) (Oral)   Resp 20   Ht 5\' 5"  (1.651 m)   Wt 264 lb (119.7 kg)   SpO2 100%   BMI 43.93 kg/m   Wt Readings from Last 3 Encounters:  05/30/23 264 lb (119.7 kg)  01/28/23 258 lb (117 kg)  01/02/23 260 lb (117.9 kg)    Physical Exam General appearance - alert, well appearing, middle-age obese Hispanic male and in no distress Mental status - normal mood, behavior, speech, dress, motor activity, and thought processes Chest - clear to auscultation, no wheezes, rales or rhonchi, symmetric air  entry Heart - normal rate, regular rhythm, normal S1, S2, no murmurs, rubs, clicks or gallops Abdomen: obese, normal bowel sounds.  No fluid wave Extremities - peripheral pulses normal, no pedal edema, no clubbing or cyanosis     Latest Ref Rng & Units 05/29/2022   11:55 AM 03/27/2022   12:22 PM 10/30/2021    9:11 AM  CMP  Glucose 70 - 99 mg/dL 89  95  295   BUN 6 - 20 mg/dL 7  7  6    Creatinine 0.76 - 1.27 mg/dL 6.21  3.08  6.57   Sodium 134 - 144 mmol/L 144  146  141   Potassium 3.5 - 5.2 mmol/L 3.6  3.4  3.5   Chloride 96 - 106 mmol/L 109  109  109   CO2 20 - 29 mmol/L 20  22  20    Calcium 8.7 - 10.2 mg/dL 9.5  8.8  8.5   Total Protein 6.0 - 8.5 g/dL  6.4  6.6   Total Bilirubin 0.0 - 1.2 mg/dL  1.4  1.6   Alkaline Phos 44 - 121 IU/L  173  163   AST 0 - 40 IU/L  48  51   ALT 0 - 44 IU/L  35  32    Lipid Panel     Component Value Date/Time   CHOL 146 05/29/2022 1155   TRIG 72 05/29/2022 1155   HDL 83 05/29/2022 1155   CHOLHDL 1.8 05/29/2022 1155   LDLCALC 49 05/29/2022 1155    CBC    Component Value Date/Time   WBC 5.8 09/28/2022 1158   WBC 5.2 10/12/2021 0400   RBC 4.66 09/28/2022 1158   RBC 4.13 (L) 10/12/2021 0400   HGB 14.4 09/28/2022 1158   HCT 42.3 09/28/2022 1158   PLT 65 (LL) 09/28/2022 1158   MCV 91 09/28/2022 1158   MCH 30.9 09/28/2022 1158   MCH 33.2 10/12/2021 0400   MCHC 34.0 09/28/2022 1158   MCHC 34.5 10/12/2021 0400   RDW 13.3 09/28/2022 1158   LYMPHSABS 1.7 10/12/2021 0400   LYMPHSABS 1.6 05/07/2019 1413   MONOABS 0.7 10/12/2021 0400   EOSABS 0.2 10/12/2021 0400   EOSABS 0.1 05/07/2019 1413   BASOSABS 0.0 10/12/2021 0400   BASOSABS 0.0 05/07/2019 1413  ASSESSMENT AND PLAN: 1. Alcoholic cirrhosis of liver without ascites (HCC) (Primary) Stable.  Plugged in with [facility].  Currently not on transplant list.  2. Alcohol use disorder, severe, in sustained remission (HCC) Commended him on this.  Encouraged him to remain  free of alcohol  3. Morbid obesity with BMI of 40.0-44.9, adult [facility]) Patient would be a good candidate for GLP-1 agonist but it is not covered by his insurance. Encouraged him to get back on track with his eating habits.  He will start exercising once his ankle issue is better.  4. Hypokalemia - Potassium   Patient was given the opportunity to ask questions.  Patient verbalized understanding of the plan and was able to repeat key elements of the plan.   This documentation was completed using Paediatric nurse.  Any transcriptional errors are unintentional.  No orders of the defined types were placed in this encounter.    Requested Prescriptions    No prescriptions requested or ordered in this encounter    No follow-ups on file.  Jonah Blue, MD, FACP

## 2023-05-31 ENCOUNTER — Encounter: Payer: Self-pay | Admitting: Internal Medicine

## 2023-05-31 LAB — POTASSIUM: Potassium: 3.5 mmol/L (ref 3.5–5.2)

## 2023-08-28 ENCOUNTER — Other Ambulatory Visit: Payer: Self-pay | Admitting: Internal Medicine

## 2023-08-28 DIAGNOSIS — K7030 Alcoholic cirrhosis of liver without ascites: Secondary | ICD-10-CM

## 2023-08-29 ENCOUNTER — Other Ambulatory Visit: Payer: Self-pay

## 2023-08-29 ENCOUNTER — Other Ambulatory Visit (HOSPITAL_COMMUNITY): Payer: Self-pay

## 2023-08-29 MED ORDER — FOLIC ACID 1 MG PO TABS
1.0000 mg | ORAL_TABLET | Freq: Every day | ORAL | 0 refills | Status: DC
  Filled 2023-08-29: qty 30, 30d supply, fill #0

## 2023-08-29 MED ORDER — LACTULOSE 10 GM/15ML PO SOLN
20.0000 g | Freq: Two times a day (BID) | ORAL | 0 refills | Status: DC
  Filled 2023-08-29: qty 1892, 32d supply, fill #0

## 2023-08-29 MED ORDER — FAMOTIDINE 20 MG PO TABS
20.0000 mg | ORAL_TABLET | Freq: Every day | ORAL | 0 refills | Status: DC
  Filled 2023-08-29: qty 30, 30d supply, fill #0

## 2023-09-27 ENCOUNTER — Ambulatory Visit: Payer: 59 | Attending: Internal Medicine | Admitting: Internal Medicine

## 2023-09-27 ENCOUNTER — Encounter: Payer: Self-pay | Admitting: Internal Medicine

## 2023-09-27 VITALS — BP 124/79 | HR 65 | Ht 65.0 in | Wt 265.4 lb

## 2023-09-27 DIAGNOSIS — K7030 Alcoholic cirrhosis of liver without ascites: Secondary | ICD-10-CM

## 2023-09-27 DIAGNOSIS — Z6841 Body Mass Index (BMI) 40.0 and over, adult: Secondary | ICD-10-CM | POA: Diagnosis not present

## 2023-09-27 NOTE — Progress Notes (Signed)
 Patient ID: [name], male    DOB: [birth year]  MRN: [id number]  CC: Medical Management of Chronic Issues (No concerns)   Subjective: [name] is a 43 y.o. male who presents for chronic ds management. His concerns today include:  Patient with history of severe ETOH use disorder in remission, alcohol induced cirrhosis, SBP, varices, pain/numbness left thigh on gabapentin    AMN Language interpreter used during this encounter. # [place], [id number]  ETOH liver cirrhosis. Overall doing well. No ascites or LE edema Having 3-4 BM daily with Lactulose  No longer taking Gabapentin . Still taking B1 and Folic Acid .  Remains ETOH free  Obesity: doing well with eating habits including avoinding sugar drinks.   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  Initial BP today is elev.  Does not use salt in foods. Patient Active Problem List   Diagnosis Date Noted   Elevated alkaline phosphatase level 05/23/2023   Ruptured tympanic membrane, right 11/17/2021   Neuropathy of left lower extremity 11/17/2021   Abdominal pain 10/10/2021   Fever 10/10/2021   Gastritis and gastroduodenitis    Hematuria 04/25/2021   Gynecomastia 04/25/2021   Alcohol use disorder, severe, in sustained remission (HCC) 09/13/2020   Esophageal varices without bleeding (HCC) 09/13/2020   Hypertension 09/13/2020   Portal hypertensive gastropathy (HCC)    Former smoker 06/10/2018   Alcoholic cirrhosis (HCC) 05/01/2018   Macrocytic anemia 05/01/2018   Hypokalemia 05/01/2018   Liver lesion, right lobe 05/01/2018   Liver failure (HCC) 05/01/2018   Liver failure without hepatic coma (HCC)    Thrombocytopenia (HCC) 03/04/2018   Alcoholic cirrhosis of liver with ascites (HCC) 06/29/2017     Current Outpatient Medications on File Prior to Visit  Medication Sig Dispense Refill   famotidine  (PEPCID ) 20 MG tablet Take 1 tablet (20 mg total) by mouth daily. 30 tablet 0   folic  acid (FOLVITE ) 1 MG tablet Take 1 tablet (1 mg total) by mouth daily. 30 tablet 0   lactulose  (CHRONULAC ) 10 GM/15ML solution Take 30 mLs (20 g total) by mouth 2 (two) times daily. 1892 mL 0   thiamine  (VITAMIN B1) 100 MG tablet Take 1 tablet (100 mg total) by mouth daily. 100 tablet 1   diclofenac  Sodium (VOLTAREN ) 1 % GEL Apply 2 g topically 4 (four) times daily. (Patient not taking: Reported on 09/27/2023) 100 g 2   fluorometholone  (FML) 0.1 % ophthalmic suspension Instill 1 drop into both eyes 4 times day for 2 weeks, then 1 drop 2 times day for 2 weeks, then stop (Patient not taking: Reported on 01/28/2023) 10 mL 0   No current facility-administered medications on file prior to visit.    Allergies  Allergen Reactions   Acetaminophen  Other (See Comments)    Cirrhosis of the liver    Social History   Socioeconomic History   Marital status: Legally Separated    Spouse name: Not on file   Number of children: Not on file   Years of education: Not on file   Highest education level: Not on file  Occupational History   Occupation: Education administrator Conservation officer, historic buildings)  Tobacco Use   Smoking status: Former   Smokeless tobacco: Never   Tobacco comments:    said he has tried it  Advertising account planner   Vaping status: Never Used  Substance and Sexual Activity   Alcohol use: Not Currently    Comment: 03/05/2018   Drug use: Never   Sexual activity: Yes  Other Topics Concern   Not on file  Social History Narrative   ** Merged History Encounter **       Lives in [name] with wife and daughter.    Social Drivers of Corporate investment banker Strain: Not on file  Food Insecurity: Low Risk  (05/23/2023)   Received from [facility]   Hunger Vital Sign    Worried About Running Out of Food in the Last Year: Never true    Ran Out of Food in the Last Year: Never true  Transportation Needs: No Transportation Needs (05/23/2023)   Received from [facility]    In the past 12 months, has  lack of reliable transportation kept you from medical appointments, meetings, work or from getting things needed for daily living? : No  Physical Activity: Not on file  Stress: Not on file  Social Connections: Unknown (01/28/2023)   Social Connection and Isolation Panel [NHANES]    Frequency of Communication with Friends and Family: More than three times a week    Frequency of Social Gatherings with Friends and Family: Not on file    Attends Religious Services: Not on file    Active Member of Clubs or Organizations: No    Attends Banker Meetings: Not on file    Marital Status: Not on file  Intimate Partner Violence: Not on file    Family History  Problem Relation Age of Onset   Colon cancer Neg Hx    Esophageal cancer Neg Hx    Healthy Mother    Healthy Daughter     Past Surgical History:  Procedure Laterality Date   BIOPSY  08/10/2019   Procedure: BIOPSY;  Surgeon: Annis Kinder, DO;  [facility];  Service: Gastroenterology;;   BIOPSY  02/08/2020   Procedure: BIOPSY;  Surgeon: Annis Kinder, DO;  [facility];  Service: Gastroenterology;;   BIOPSY  09/11/2021   Procedure: BIOPSY;  Surgeon: Annis Kinder, DO;  [facility];  Service: Gastroenterology;;   ESOPHAGEAL BANDING  08/10/2019   Procedure: ESOPHAGEAL BANDING;  Surgeon: Annis Kinder, DO;  [facility];  Service: Gastroenterology;;   ESOPHAGOGASTRODUODENOSCOPY (EGD) WITH PROPOFOL  N/A 08/10/2019   Procedure: ESOPHAGOGASTRODUODENOSCOPY (EGD) WITH PROPOFOL ;  Surgeon: Annis Kinder, DO;  [facility];  Service: Gastroenterology;  Laterality: N/A;   ESOPHAGOGASTRODUODENOSCOPY (EGD) WITH PROPOFOL  N/A 02/08/2020   Procedure: ESOPHAGOGASTRODUODENOSCOPY (EGD) WITH PROPOFOL ;  Surgeon: Annis Kinder, DO;  [facility];  Service: Gastroenterology;  Laterality: N/A;   ESOPHAGOGASTRODUODENOSCOPY (EGD) WITH PROPOFOL  N/A 09/11/2021    Procedure: ESOPHAGOGASTRODUODENOSCOPY (EGD) WITH PROPOFOL ;  Surgeon: Annis Kinder, DO;  [facility];  Service: Gastroenterology;  Laterality: N/A;   FOOT SURGERY Left    around 2014. a scar on lower leg   ORIF ANKLE FRACTURE Right 06/09/2019   Procedure: RIGHT OPEN REDUCTION INTERNAL FIXATION (ORIF) ANKLE FRACTURE;  Surgeon: Saundra Curl, MD;  [facility];  Service: Orthopedics;  Laterality: Right;    ROS: Review of Systems Negative except as stated above  PHYSICAL EXAM: BP 124/79   Pulse 65   Ht 5\' 5"  (1.651 m)   Wt 265 lb 6.4 oz (120.4 kg)   SpO2 99%   BMI 44.16 kg/m   Wt Readings from Last 3 Encounters:  09/27/23 265 lb 6.4 oz (120.4 kg)  05/30/23 264 lb (119.7 kg)  01/28/23 258 lb (117 kg)  ' Physical Exam   General  appearance - alert, well appearing, and in no distress Mental status - normal mood, behavior, speech, dress, motor activity, and thought processes Chest - clear to auscultation, no wheezes, rales or rhonchi, symmetric air entry Heart - normal rate, regular rhythm, normal S1, S2, no murmurs, rubs, clicks or gallops  Extremities - peripheral pulses normal, no pedal edema, no clubbing or cyanosis     Latest Ref Rng & Units 05/30/2023   10:03 AM 05/29/2022   11:55 AM 03/27/2022   12:22 PM  CMP  Glucose 70 - 99 mg/dL  89  95   BUN 6 - 20 mg/dL  7  7   Creatinine 1.61 - 1.27 mg/dL  0.96  0.45   Sodium 409 - 144 mmol/L  144  146   Potassium 3.5 - 5.2 mmol/L 3.5  3.6  3.4   Chloride 96 - 106 mmol/L  109  109   CO2 20 - 29 mmol/L  20  22   Calcium 8.7 - 10.2 mg/dL  9.5  8.8   Total Protein 6.0 - 8.5 g/dL   6.4   Total Bilirubin 0.0 - 1.2 mg/dL   1.4   Alkaline Phos 44 - 121 IU/L   173   AST 0 - 40 IU/L   48   ALT 0 - 44 IU/L   35    Lipid Panel     Component Value Date/Time   CHOL 146 05/29/2022 1155   TRIG 72 05/29/2022 1155   HDL 83 05/29/2022 1155   CHOLHDL 1.8 05/29/2022 1155   LDLCALC 49 05/29/2022  1155    CBC    Component Value Date/Time   WBC 5.8 09/28/2022 1158   WBC 5.2 10/12/2021 0400   RBC 4.66 09/28/2022 1158   RBC 4.13 (L) 10/12/2021 0400   HGB 14.4 09/28/2022 1158   HCT 42.3 09/28/2022 1158   PLT 65 (LL) 09/28/2022 1158   MCV 91 09/28/2022 1158   MCH 30.9 09/28/2022 1158   MCH 33.2 10/12/2021 0400   MCHC 34.0 09/28/2022 1158   MCHC 34.5 10/12/2021 0400   RDW 13.3 09/28/2022 1158   LYMPHSABS 1.7 10/12/2021 0400   LYMPHSABS 1.6 05/07/2019 1413   MONOABS 0.7 10/12/2021 0400   EOSABS 0.2 10/12/2021 0400   EOSABS 0.1 05/07/2019 1413   BASOSABS 0.0 10/12/2021 0400   BASOSABS 0.0 05/07/2019 1413    ASSESSMENT AND PLAN: 1. Alcoholic cirrhosis of liver without ascites (HCC) (Primary) Patient has remained alcohol free. He will continue the lactulose . No signs of decompensation at this time.  2. Morbid obesity with BMI of 40.0-44.9, adult (HCC) Encouraged him to continue to try to eat healthy and to move is much as he can.  GLP-1 agonist is not an option for him as it is not covered by his insurance.  Patient was given the opportunity to ask questions.  Patient verbalized understanding of the plan and was able to repeat key elements of the plan.   This documentation was completed using Paediatric nurse.  Any transcriptional errors are unintentional.  No orders of the defined types were placed in this encounter.    Requested Prescriptions    No prescriptions requested or ordered in this encounter    Return in about 6 months (around 03/29/2024).  Concetta Dee, MD, FACP

## 2023-10-14 ENCOUNTER — Other Ambulatory Visit: Payer: Self-pay

## 2023-10-14 ENCOUNTER — Other Ambulatory Visit: Payer: Self-pay | Admitting: Internal Medicine

## 2023-10-14 ENCOUNTER — Other Ambulatory Visit (HOSPITAL_COMMUNITY): Payer: Self-pay

## 2023-10-14 DIAGNOSIS — K7030 Alcoholic cirrhosis of liver without ascites: Secondary | ICD-10-CM

## 2023-10-14 MED ORDER — FAMOTIDINE 20 MG PO TABS
20.0000 mg | ORAL_TABLET | Freq: Every day | ORAL | 1 refills | Status: DC
  Filled 2023-10-14: qty 90, 90d supply, fill #0
  Filled 2024-01-07: qty 90, 90d supply, fill #1

## 2023-10-14 MED ORDER — FOLIC ACID 1 MG PO TABS
1.0000 mg | ORAL_TABLET | Freq: Every day | ORAL | 1 refills | Status: DC
  Filled 2023-10-14: qty 90, 90d supply, fill #0
  Filled 2024-01-07: qty 90, 90d supply, fill #1

## 2023-11-26 ENCOUNTER — Other Ambulatory Visit: Payer: Self-pay | Admitting: Nurse Practitioner

## 2023-11-26 DIAGNOSIS — K7031 Alcoholic cirrhosis of liver with ascites: Secondary | ICD-10-CM

## 2023-11-26 DIAGNOSIS — I8510 Secondary esophageal varices without bleeding: Secondary | ICD-10-CM

## 2023-12-06 ENCOUNTER — Other Ambulatory Visit (HOSPITAL_COMMUNITY): Payer: Self-pay | Admitting: Nurse Practitioner

## 2023-12-06 DIAGNOSIS — I8510 Secondary esophageal varices without bleeding: Secondary | ICD-10-CM

## 2023-12-06 DIAGNOSIS — K7031 Alcoholic cirrhosis of liver with ascites: Secondary | ICD-10-CM

## 2023-12-13 ENCOUNTER — Ambulatory Visit (HOSPITAL_COMMUNITY)
Admission: RE | Admit: 2023-12-13 | Discharge: 2023-12-13 | Disposition: A | Discharge status: Home / Self Care | Source: Ambulatory Visit | Attending: Nurse Practitioner | Admitting: Nurse Practitioner

## 2023-12-13 DIAGNOSIS — I8510 Secondary esophageal varices without bleeding: Secondary | ICD-10-CM | POA: Insufficient documentation

## 2023-12-13 DIAGNOSIS — K7031 Alcoholic cirrhosis of liver with ascites: Secondary | ICD-10-CM | POA: Insufficient documentation

## 2024-01-07 ENCOUNTER — Other Ambulatory Visit: Payer: Self-pay | Admitting: Internal Medicine

## 2024-01-07 DIAGNOSIS — K7030 Alcoholic cirrhosis of liver without ascites: Secondary | ICD-10-CM

## 2024-01-08 ENCOUNTER — Telehealth: Payer: Self-pay | Admitting: Internal Medicine

## 2024-01-08 ENCOUNTER — Other Ambulatory Visit: Payer: Self-pay

## 2024-01-08 NOTE — Telephone Encounter (Signed)
 Pt unconfirmed appt int (per vr) 9/10

## 2024-01-09 ENCOUNTER — Other Ambulatory Visit: Payer: Self-pay

## 2024-01-09 ENCOUNTER — Ambulatory Visit: Attending: Family Medicine | Admitting: Family Medicine

## 2024-01-09 ENCOUNTER — Other Ambulatory Visit (HOSPITAL_COMMUNITY): Payer: Self-pay

## 2024-01-09 ENCOUNTER — Encounter: Payer: Self-pay | Admitting: Family Medicine

## 2024-01-09 VITALS — BP 137/86 | HR 67 | Ht 65.0 in | Wt 260.0 lb

## 2024-01-09 DIAGNOSIS — K7030 Alcoholic cirrhosis of liver without ascites: Secondary | ICD-10-CM

## 2024-01-09 DIAGNOSIS — Z131 Encounter for screening for diabetes mellitus: Secondary | ICD-10-CM | POA: Diagnosis not present

## 2024-01-09 DIAGNOSIS — Z13220 Encounter for screening for lipoid disorders: Secondary | ICD-10-CM

## 2024-01-09 DIAGNOSIS — E876 Hypokalemia: Secondary | ICD-10-CM

## 2024-01-09 DIAGNOSIS — Z23 Encounter for immunization: Secondary | ICD-10-CM | POA: Diagnosis not present

## 2024-01-09 DIAGNOSIS — Z136 Encounter for screening for cardiovascular disorders: Secondary | ICD-10-CM

## 2024-01-09 DIAGNOSIS — K766 Portal hypertension: Secondary | ICD-10-CM | POA: Diagnosis not present

## 2024-01-09 LAB — POCT GLYCOSYLATED HEMOGLOBIN (HGB A1C): Hemoglobin A1C: 5.6 % (ref 4.0–5.6)

## 2024-01-09 MED ORDER — LACTULOSE 10 GM/15ML PO SOLN
20.0000 g | Freq: Two times a day (BID) | ORAL | 0 refills | Status: AC
  Filled 2024-01-09 – 2024-05-06 (×4): qty 1892, 32d supply, fill #0

## 2024-01-09 MED ORDER — FAMOTIDINE 20 MG PO TABS
20.0000 mg | ORAL_TABLET | Freq: Every day | ORAL | 1 refills | Status: AC
  Filled 2024-01-09 – 2024-05-06 (×4): qty 90, 90d supply, fill #0

## 2024-01-09 MED ORDER — FOLIC ACID 1 MG PO TABS
1.0000 mg | ORAL_TABLET | Freq: Every day | ORAL | 1 refills | Status: AC
  Filled 2024-01-09 – 2024-05-06 (×4): qty 90, 90d supply, fill #0

## 2024-01-09 MED ORDER — THIAMINE HCL 100 MG PO TABS
100.0000 mg | ORAL_TABLET | Freq: Every day | ORAL | 1 refills | Status: AC
  Filled 2024-01-09 – 2024-05-06 (×4): qty 100, 100d supply, fill #0

## 2024-01-09 NOTE — Telephone Encounter (Signed)
 Requested medication (s) are due for refill today: yes  Requested medication (s) are on the active medication list: yes  Last refill:  05/29/22  Future visit scheduled: yes  Notes to clinic:  Medication not assigned to a protocol, review manually.      Requested Prescriptions  Pending Prescriptions Disp Refills   thiamine  (VITAMIN B1) 100 MG tablet 100 tablet 1    Sig: Take 1 tablet (100 mg total) by mouth daily.     Off-Protocol Failed - 01/09/2024 10:18 AM      Failed - Medication not assigned to a protocol, review manually.      Passed - Valid encounter within last 12 months    Recent Outpatient Visits           Today Alcoholic cirrhosis of liver without ascites (HCC)   [place] [facility] Of [place]. [facility] Delbert Clam, MD   3 months ago Alcoholic cirrhosis of liver without ascites [facility])   [place] [facility] Of [place]. [facility] Vicci Sober B, MD   7 months ago Alcoholic cirrhosis of liver without ascites [facility])   [place] [facility] - [facility] Dept Of [place]. [facility] Vicci Sober NOVAK, MD   11 months ago Pterygium of right eye   [place] [facility] - [facility] Dept Of [place]. [facility] Vicci Sober NOVAK, MD   1 year ago Morbid obesity with BMI of 40.0-44.9, adult [facility])   [place] [facility] - A Dept Of [place]. [facility] Vicci Sober NOVAK, MD               lactulose  (CHRONULAC ) 10 GM/15ML solution 1892 mL 0    Sig: Take 30 mLs (20 g total) by mouth 2 (two) times daily.     Gastroenterology:  Laxatives - lactulose  Failed - 01/09/2024 10:18 AM      Failed - Cl in normal range and within 360 days    Chloride  Date Value Ref Range Status  05/29/2022 109 (H) 96 - 106 mmol/L Final         Failed - CO2 in normal range and within 360 days    CO2  Date Value Ref Range Status  05/29/2022 20 20 - 29 mmol/L Final          Failed - Na in normal range and within 360 days    Sodium  Date Value Ref Range Status  05/29/2022 144 134 - 144 mmol/L Final         Passed - K in normal range and within 360 days    Potassium  Date Value Ref Range Status  05/30/2023 3.5 3.5 - 5.2 mmol/L Final         Passed - Valid encounter within last 12 months    Recent Outpatient Visits           Today Alcoholic cirrhosis of liver without ascites (HCC)   [place] [facility] - [facility] Dept Of [place]. [facility] Delbert Clam, MD   3 months ago Alcoholic cirrhosis of liver without ascites [facility])   [place] [facility] - A Dept Of [place]. [facility] Vicci Sober B, MD   7 months ago Alcoholic cirrhosis of liver without ascites [facility])    [facility] - [facility] Dept Of [place]. [facility]  Vicci Barnie NOVAK, MD   11 months ago Pterygium of right eye   [place] [facility] - A Dept Of [place]. [facility] Vicci Barnie NOVAK, MD   1 year ago Morbid obesity with BMI of 40.0-44.9, adult [facility])   [place] [facility] - A Dept Of [place]. [facility] Vicci Barnie NOVAK, MD

## 2024-01-09 NOTE — Progress Notes (Signed)
 Subjective:  Patient ID: [name], male    DOB: 30-Jan-1981  Age: 43 y.o. MRN: [id number]  CC: Medical Management of Chronic Issues (Wants to get checked for diabetes)     Discussed the use of AI scribe software for clinical note transcription with the patient, who gave verbal consent to proceed.  History of Present Illness [name] is a 43 year old male with cirrhosis of the liver who presents for blood work and diabetes screening.  He has cirrhosis of the liver and has stopped drinking alcohol. He occasionally notices slight yellowing of the eyes. There is no swelling of the legs or abdomen. He did not eat breakfast today.  He is currently under the care of [facility] for management of his cirrhosis.  In July, blood work from [facility] showed low potassium levels of 3.3.  AFP was normal and ultrasound of the liver showed cirrhosis and portal hypertension.  He is currently taking famotidine  20 mg and lactulose . He is unsure if he needs refills for these medications.    Past Medical History:  Diagnosis Date   Alcoholic cirrhosis of liver (HCC)    Alcoholic cirrhosis of liver with ascites (HCC) 06/29/2017   History of heavy, daily drinking. No alcohol since 05/2017. Started treatment 05/2017 with lactulose , thiamine , MVI Spironolactone  + furosemide  initiated 06/2017.   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 He is currently on lactulose  for questionable history of [place] encephalopathy.  No evidence of encephalopathy at today's visit.  Reviewed signs/symptoms of encephalopathy and indication to contact our office.   GERD (gastroesophageal reflux disease)    Hypertension    Nocturia    SBP (spontaneous bacterial peritonitis) (HCC) 05/01/2018    Thrombocytopenia (HCC)     Past Surgical History:  Procedure Laterality Date   BIOPSY  08/10/2019   Procedure: BIOPSY;  Surgeon: San Sandor GAILS, DO;  [facility];  Service: Gastroenterology;;   BIOPSY  02/08/2020   Procedure: BIOPSY;  Surgeon: San Sandor GAILS, DO;  [facility];  Service: Gastroenterology;;   BIOPSY  09/11/2021   Procedure: BIOPSY;  Surgeon: San Sandor GAILS, DO;  [facility];  Service: Gastroenterology;;   ESOPHAGEAL BANDING  08/10/2019   Procedure: ESOPHAGEAL BANDING;  Surgeon: San Sandor GAILS, DO;  [facility];  Service: Gastroenterology;;   ESOPHAGOGASTRODUODENOSCOPY (EGD) WITH PROPOFOL  N/A 08/10/2019   Procedure: ESOPHAGOGASTRODUODENOSCOPY (EGD) WITH PROPOFOL ;  Surgeon: San Sandor GAILS, DO;  [facility];  Service: Gastroenterology;  Laterality: N/A;   ESOPHAGOGASTRODUODENOSCOPY (EGD) WITH PROPOFOL  N/A 02/08/2020   Procedure: ESOPHAGOGASTRODUODENOSCOPY (EGD) WITH PROPOFOL ;  Surgeon: San Sandor GAILS, DO;  [facility];  Service: Gastroenterology;  Laterality: N/A;   ESOPHAGOGASTRODUODENOSCOPY (EGD) WITH PROPOFOL  N/A 09/11/2021   Procedure: ESOPHAGOGASTRODUODENOSCOPY (EGD) WITH PROPOFOL ;  Surgeon: San Sandor GAILS, DO;  [facility];  Service: Gastroenterology;  Laterality: N/A;   FOOT SURGERY Left    around 2014. a scar on lower leg   ORIF ANKLE FRACTURE Right 06/09/2019   Procedure: RIGHT OPEN REDUCTION INTERNAL FIXATION (ORIF) ANKLE FRACTURE;  Surgeon: Beverley Evalene BIRCH, MD;  Location[facility];  Service: Orthopedics;  Laterality: Right;    Family History  Problem Relation Age of Onset   Colon cancer  Neg Hx    Esophageal cancer Neg Hx    Healthy Mother    Healthy Daughter     Social History   Socioeconomic History   Marital status: Legally Separated    Spouse name: Not on file   Number of children: Not on file   Years of education: Not on file    Highest education level: Not on file  Occupational History   Occupation: Education administrator Conservation officer, historic buildings)  Tobacco Use   Smoking status: Former   Smokeless tobacco: Never   Tobacco comments:    said he has tried it  Advertising account planner   Vaping status: Never Used  Substance and Sexual Activity   Alcohol use: Not Currently    Comment: 03/05/2018   Drug use: Never   Sexual activity: Yes  Other Topics Concern   Not on file  Social History Narrative   ** Merged History Encounter **       Lives in [place] with wife and daughter.    Social Drivers of Corporate investment banker Strain: Low Risk  (01/05/2024)   Overall Financial Resource Strain (CARDIA)    Difficulty of Paying Living Expenses: Not hard at all  Food Insecurity: No Food Insecurity (01/05/2024)   Hunger Vital Sign    Worried About Running Out of Food in the Last Year: Never true    Ran Out of Food in the Last Year: Never true  Transportation Needs: No Transportation Needs (01/05/2024)   PRAPARE - Administrator, Civil Service (Medical): No    Lack of Transportation (Non-Medical): No  Physical Activity: Inactive (01/05/2024)   Exercise Vital Sign    Days of Exercise per Week: 0 days    Minutes of Exercise per Session: Not on file  Stress: No Stress Concern Present (01/05/2024)   [employer] of Occupational Health - Occupational Stress Questionnaire    Feeling of Stress: Not at all  Social Connections: Unknown (01/05/2024)   Social Connection and Isolation Panel    Frequency of Communication with Friends and Family: More than three times a week    Frequency of Social Gatherings with Friends and Family: More than three times a week    Attends Religious Services: Never    Database administrator or Organizations: No    Attends Engineer, structural: Not on file    Marital Status: Patient declined    Allergies  Allergen Reactions   Acetaminophen  Other (See Comments)    Cirrhosis of the liver    Outpatient  Medications Prior to Visit  Medication Sig Dispense Refill   famotidine  (PEPCID ) 20 MG tablet Take 1 tablet (20 mg total) by mouth daily. 90 tablet 1   folic acid  (FOLVITE ) 1 MG tablet Take 1 tablet (1 mg total) by mouth daily. 90 tablet 1   lactulose  (CHRONULAC ) 10 GM/15ML solution Take 30 mLs (20 g total) by mouth 2 (two) times daily. 1892 mL 0   thiamine  (VITAMIN B1) 100 MG tablet Take 1 tablet (100 mg total) by mouth daily. 100 tablet 1   diclofenac  Sodium (VOLTAREN ) 1 % GEL Apply 2 g topically 4 (four) times daily. (Patient not taking: Reported on 01/09/2024) 100 g 2   fluorometholone  (FML) 0.1 % ophthalmic suspension Instill 1 drop into both eyes 4 times day for 2 weeks, then 1 drop 2 times day for 2 weeks, then stop (Patient not taking: Reported on 01/09/2024) 10 mL 0   No facility-administered medications prior to visit.  ROS Review of Systems  Constitutional:  Negative for activity change and appetite change.  HENT:  Negative for sinus pressure and sore throat.   Respiratory:  Negative for chest tightness, shortness of breath and wheezing.   Cardiovascular:  Negative for chest pain and palpitations.  Gastrointestinal:  Negative for abdominal distention, abdominal pain and constipation.  Genitourinary: Negative.   Musculoskeletal: Negative.   Psychiatric/Behavioral:  Negative for behavioral problems and dysphoric mood.     Objective:  BP 137/86   Pulse 67   Ht 5' 5 (1.651 m)   Wt 260 lb (117.9 kg)   SpO2 99%   BMI 43.27 kg/m      01/09/2024    8:54 AM 09/27/2023    9:25 AM 09/27/2023    9:11 AM  BP/Weight  Systolic BP 137 124 135  Diastolic BP 86 79 81  Wt. (Lbs) 260  265.4  BMI 43.27 kg/m2  44.16 kg/m2      Physical Exam Constitutional:      Appearance: He is well-developed. He is obese.  Eyes:     General: No scleral icterus. Cardiovascular:     Rate and Rhythm: Normal rate.     Heart sounds: Normal heart sounds. No murmur heard. Pulmonary:      Effort: Pulmonary effort is normal.     Breath sounds: Normal breath sounds. No wheezing or rales.  Chest:     Chest wall: No tenderness.  Abdominal:     General: Bowel sounds are normal. There is no distension.     Palpations: Abdomen is soft. There is no mass.     Tenderness: There is no abdominal tenderness.  Musculoskeletal:        General: Normal range of motion.     Right lower leg: No edema.     Left lower leg: No edema.  Neurological:     Mental Status: He is alert and oriented to person, place, and time.  Psychiatric:        Mood and Affect: Mood normal.        Latest Ref Rng & Units 05/30/2023   10:03 AM 05/29/2022   11:55 AM 03/27/2022   12:22 PM  CMP  Glucose 70 - 99 mg/dL  89  95   BUN 6 - 20 mg/dL  7  7   Creatinine 9.23 - 1.27 mg/dL  9.41  9.40   Sodium 865 - 144 mmol/L  144  146   Potassium 3.5 - 5.2 mmol/L 3.5  3.6  3.4   Chloride 96 - 106 mmol/L  109  109   CO2 20 - 29 mmol/L  20  22   Calcium 8.7 - 10.2 mg/dL  9.5  8.8   Total Protein 6.0 - 8.5 g/dL   6.4   Total Bilirubin 0.0 - 1.2 mg/dL   1.4   Alkaline Phos 44 - 121 IU/L   173   AST 0 - 40 IU/L   48   ALT 0 - 44 IU/L   35     Lipid Panel     Component Value Date/Time   CHOL 146 05/29/2022 1155   TRIG 72 05/29/2022 1155   HDL 83 05/29/2022 1155   CHOLHDL 1.8 05/29/2022 1155   LDLCALC 49 05/29/2022 1155    CBC    Component Value Date/Time   WBC 5.8 09/28/2022 1158   WBC 5.2 10/12/2021 0400   RBC 4.66 09/28/2022 1158   RBC 4.13 (L) 10/12/2021 0400   HGB 14.4  09/28/2022 1158   HCT 42.3 09/28/2022 1158   PLT 65 (LL) 09/28/2022 1158   MCV 91 09/28/2022 1158   MCH 30.9 09/28/2022 1158   MCH 33.2 10/12/2021 0400   MCHC 34.0 09/28/2022 1158   MCHC 34.5 10/12/2021 0400   RDW 13.3 09/28/2022 1158   LYMPHSABS 1.7 10/12/2021 0400   LYMPHSABS 1.6 05/07/2019 1413   MONOABS 0.7 10/12/2021 0400   EOSABS 0.2 10/12/2021 0400   EOSABS 0.1 05/07/2019 1413   BASOSABS 0.0 10/12/2021 0400   BASOSABS  0.0 05/07/2019 1413    Lab Results  Component Value Date   HGBA1C 5.6 01/09/2024       Assessment & Plan Alcoholic cirrhosis of liver with portal hypertension Chronic alcoholic cirrhosis with portal hypertension. Previous labs from [facility]. Managed with famotidine  and lactulose . Previous low potassium requires re-evaluation. - Order blood work for liver function, kidney function, cholesterol, blood count, and bleeding risk. - Check potassium levels. - Refill famotidine  and lactulose .  Encounter for screening for for lipid for cardiovascular disease - Lipid panel ordered  Hypokalemia Will check potassium levels  Screening for diabetes mellitus - A1c 5.6   Healthcare maintenance Need for influenza vaccine-flu shot administered  Meds ordered this encounter  Medications   famotidine  (PEPCID ) 20 MG tablet    Sig: Take 1 tablet (20 mg total) by mouth daily.    Dispense:  90 tablet    Refill:  1   folic acid  (FOLVITE ) 1 MG tablet    Sig: Take 1 tablet (1 mg total) by mouth daily.    Dispense:  90 tablet    Refill:  1    Follow-up: Return in about 3 months (around 04/09/2024) for Medical conditions with PCP.       Corrina Sabin, MD, FAAFP. [facility] [place], [place] [phone]   01/09/2024, 2:08 PM

## 2024-01-09 NOTE — Patient Instructions (Signed)
 Enfermedad heptica por alcoholismo: Qu debe saber Alcoholic Liver Disease: What to Know  La enfermedad heptica por alcoholismo es una enfermedad que afecta el hgado. El hgado es el rgano que filtra el alcohol y lo saca del torrente sanguneo. Esta enfermedad tiene 3 etapas. Estas son: La enfermedad del hgado graso, tambin llamada esteatosis heptica. Esta es la primera etapa. Puede suceder que no cause sntomas. Hepatitis alcohlica. Esto ocurre cuando el hgado se irrita y se hincha. Cirrosis por alcohol. Esto ocurre cuando muchas clulas hepticas sanas son reemplazadas por tejido cicatricial. Cules son las causas? La enfermedad heptica por alcoholismo es causada por el consumo de gran cantidad de alcohol durante un largo perodo de [place]. Si usted bebe demasiado y con gran frecuencia, es posible que el hgado no pueda funcionar como debera. Qu incrementa el riesgo? Es ms probable que tenga esta enfermedad si: Es mujer. Tiene sobrepeso. Tiene antecedentes familiares de esta enfermedad. Bebe mucha cantidad de alcohol en poco tiempo. Esto tambin se denomina consumo compulsivo de alcohol. Tiene una enfermedad heptica, como hepatitis B o C. No consume una dieta saludable. Su dieta no tiene la cantidad suficiente de ciertos nutrientes. Cules son los signos o sntomas? Es posible que no tenga sntomas en las etapas iniciales de la enfermedad. Si tiene sntomas, pueden incluir los siguientes: No tener hambre de la manera habitual. Ganas de vomitar. Vmitos. Si la enfermedad empeora, los sntomas pueden incluir los siguientes: Heces acuosas. Lajune. Cansancio. Prdida de peso involuntaria. Ictericia. Esta ocurre cuando la piel y las partes blancas de los ojos se ponen Designer, television/film set. Picazn en la piel. Dolor e hinchazn en el vientre. Si la enfermedad se agrava mucho, usted puede tener lo siguiente: Prdida de la masa muscular. Mucho lquido en el vientre. Hinchazn en  los pies y los tobillos. Hemorragias nasales. Encas sangrantes. Cambios en el humor. Confusin. Cmo se diagnostica? La enfermedad heptica por alcoholismo puede diagnosticarse en funcin de un examen. Tambin podra necesitar pruebas. Pueden incluir: Anlisis de sangre. Una ecografa del hgado. Una exploracin por tomografa computarizada (TC). Una resonancia magntica (RM). Una biopsia de hgado. Consiste en extraer ignacia lemar jubilee de tejido del hgado para analizarla. Cmo se trata? Debe dejar de beber alcohol. Hable con el mdico si necesita ayuda. Tambin puede: Tomar medicamentos para [employer] de abstinencia. Estos son sntomas que puede tener tras dejar de beber. Entrar en un programa de tratamiento que lo ayude a dejar de beber. Unirse a un grupo de apoyo. Siga una dieta saludable. Tomar vitaminas. Tomar medicamentos para aliviar la hinchazn y la irritacin del hgado. Recibir un trasplante de hgado. Esto quiere decir que se obtiene un hgado de Engineer, maintenance (IT). Para recibir un trasplante, debe dejar de beber y comprometerse a no volver a hacerlo nunca ms. Siga estas instrucciones en su casa: Use sus medicamentos nicamente segn las indicaciones. Estos incluyen las vitaminas. No tome medicamentos ni coma alimentos que contengan alcohol, a menos que el mdico le indique que puede [place]. Concurra a todas las visitas de seguimiento. El mdico controlar si su situacin mejora. Dnde obtener apoyo Alcoholics Anonymous (Alcohlicos Annimos, AA): [email or web address] Comunquese con un mdico si: Tiene fiebre o escalofros. La piel se torna: [place]. Plida. [place]. Tiene dolores de [place]. Presenta dificultad para caminar. Solicite ayuda de inmediato si: Vomita, y el vmito tiene se parece a lo siguiente: Sangre de color rojo brillante. Borra de caf. Su materia fecal se ve sanguinolenta o negra. Tiene dificultad para respirar. Se siente confundido. No  est pensando con claridad. Tiene una convulsin. Estos sntomas pueden Customer service manager. Llame al 911 de inmediato. No espere a ver si los sntomas desaparecen. No conduzca por sus propios medios [employer]. Esta informacin no tiene Theme park manager el consejo del mdico. Asegrese de hacerle al mdico cualquier pregunta que tenga. Document Revised: 11/22/2022 Document Reviewed: 11/22/2022 Elsevier Patient Education  2025 [employer].

## 2024-01-10 ENCOUNTER — Ambulatory Visit: Payer: Self-pay | Admitting: Family Medicine

## 2024-01-10 ENCOUNTER — Other Ambulatory Visit: Payer: Self-pay

## 2024-01-10 LAB — CBC WITH DIFFERENTIAL/PLATELET
Basophils Absolute: 0 x10E3/uL (ref 0.0–0.2)
Basos: 1 %
EOS (ABSOLUTE): 0.1 x10E3/uL (ref 0.0–0.4)
Eos: 2 %
Hematocrit: 44.6 % (ref 37.5–51.0)
Hemoglobin: 15.3 g/dL (ref 13.0–17.7)
Immature Grans (Abs): 0 x10E3/uL (ref 0.0–0.1)
Immature Granulocytes: 0 %
Lymphocytes Absolute: 1.7 x10E3/uL (ref 0.7–3.1)
Lymphs: 34 %
MCH: 33.2 pg — ABNORMAL HIGH (ref 26.6–33.0)
MCHC: 34.3 g/dL (ref 31.5–35.7)
MCV: 97 fL (ref 79–97)
Monocytes Absolute: 0.5 x10E3/uL (ref 0.1–0.9)
Monocytes: 10 %
Neutrophils Absolute: 2.7 x10E3/uL (ref 1.4–7.0)
Neutrophils: 53 %
Platelets: 55 x10E3/uL — CL (ref 150–450)
RBC: 4.61 x10E6/uL (ref 4.14–5.80)
RDW: 14 % (ref 11.6–15.4)
WBC: 5 x10E3/uL (ref 3.4–10.8)

## 2024-01-10 LAB — CMP14+EGFR
ALT: 39 IU/L (ref 0–44)
AST: 53 IU/L — ABNORMAL HIGH (ref 0–40)
Albumin: 3.7 g/dL — ABNORMAL LOW (ref 4.1–5.1)
Alkaline Phosphatase: 126 IU/L — ABNORMAL HIGH (ref 44–121)
BUN/Creatinine Ratio: 14 (ref 9–20)
BUN: 8 mg/dL (ref 6–24)
Bilirubin Total: 1.9 mg/dL — ABNORMAL HIGH (ref 0.0–1.2)
CO2: 20 mmol/L (ref 20–29)
Calcium: 8.5 mg/dL — ABNORMAL LOW (ref 8.7–10.2)
Chloride: 110 mmol/L — ABNORMAL HIGH (ref 96–106)
Creatinine, Ser: 0.59 mg/dL — ABNORMAL LOW (ref 0.76–1.27)
Globulin, Total: 2.1 g/dL (ref 1.5–4.5)
Glucose: 106 mg/dL — ABNORMAL HIGH (ref 70–99)
Potassium: 3.4 mmol/L — ABNORMAL LOW (ref 3.5–5.2)
Sodium: 143 mmol/L (ref 134–144)
Total Protein: 5.8 g/dL — ABNORMAL LOW (ref 6.0–8.5)
eGFR: 126 mL/min/1.73 (ref >59)

## 2024-01-10 LAB — PT AND PTT
INR: 1.1 (ref 0.9–1.2)
Prothrombin Time: 11.6 s (ref 9.1–12.0)
aPTT: 31 s (ref 24–33)

## 2024-01-10 LAB — LP+NON-HDL CHOLESTEROL
Cholesterol, Total: 135 mg/dL (ref 100–199)
HDL: 73 mg/dL (ref >39)
LDL Chol Calc (NIH): 51 mg/dL (ref 0–99)
Total Non-HDL-Chol (LDL+VLDL): 62 mg/dL (ref 0–129)
Triglycerides: 50 mg/dL (ref 0–149)
VLDL Cholesterol Cal: 11 mg/dL (ref 5–40)

## 2024-01-10 MED ORDER — POTASSIUM CHLORIDE ER 10 MEQ PO TBCR
10.0000 meq | EXTENDED_RELEASE_TABLET | Freq: Every day | ORAL | 1 refills | Status: AC
  Filled 2024-01-10 – 2024-05-06 (×4): qty 30, 30d supply, fill #0

## 2024-01-14 ENCOUNTER — Other Ambulatory Visit: Payer: Self-pay

## 2024-01-16 ENCOUNTER — Other Ambulatory Visit: Payer: Self-pay

## 2024-01-21 ENCOUNTER — Other Ambulatory Visit: Payer: Self-pay

## 2024-02-11 ENCOUNTER — Ambulatory Visit: Admitting: Gastroenterology

## 2024-02-18 ENCOUNTER — Ambulatory Visit: Admitting: Gastroenterology

## 2024-03-12 ENCOUNTER — Encounter: Payer: Self-pay | Admitting: Pharmacist

## 2024-03-12 ENCOUNTER — Other Ambulatory Visit: Payer: Self-pay

## 2024-03-12 ENCOUNTER — Other Ambulatory Visit (HOSPITAL_COMMUNITY): Payer: Self-pay

## 2024-03-17 ENCOUNTER — Other Ambulatory Visit: Payer: Self-pay

## 2024-03-30 ENCOUNTER — Ambulatory Visit: Admitting: Internal Medicine

## 2024-04-16 ENCOUNTER — Other Ambulatory Visit: Payer: Self-pay

## 2024-04-16 ENCOUNTER — Encounter: Payer: Self-pay | Admitting: Pharmacist

## 2024-05-06 ENCOUNTER — Other Ambulatory Visit: Payer: Self-pay
